# Patient Record
Sex: Female | Born: 1945 | Race: Black or African American | Hispanic: No | Smoking: Former smoker
Health system: Southern US, Community
[De-identification: ages and names within clinical notes are randomized; demographics above are authoritative.]

## PROBLEM LIST (undated history)

## (undated) DIAGNOSIS — E7800 Pure hypercholesterolemia, unspecified: Secondary | ICD-10-CM

## (undated) DIAGNOSIS — N950 Postmenopausal bleeding: Secondary | ICD-10-CM

## (undated) DIAGNOSIS — M1990 Unspecified osteoarthritis, unspecified site: Secondary | ICD-10-CM

## (undated) DIAGNOSIS — I10 Essential (primary) hypertension: Secondary | ICD-10-CM

## (undated) HISTORY — PX: CATARACT EXTRACTION: SUR2

## (undated) HISTORY — DX: Essential (primary) hypertension: I10

## (undated) HISTORY — DX: Postmenopausal bleeding: N95.0

## (undated) HISTORY — PX: COLONOSCOPY: SHX174

## (undated) HISTORY — PX: KNEE ARTHROSCOPY: SUR90

## (undated) HISTORY — PX: DILATION AND CURETTAGE OF UTERUS: SHX78

---

## 1998-07-06 ENCOUNTER — Ambulatory Visit (HOSPITAL_COMMUNITY): Admission: RE | Admit: 1998-07-06 | Discharge: 1998-07-06 | Payer: Self-pay | Admitting: Gastroenterology

## 1998-07-27 ENCOUNTER — Other Ambulatory Visit: Admission: RE | Admit: 1998-07-27 | Discharge: 1998-07-27 | Payer: Self-pay | Admitting: Gynecology

## 1999-10-05 ENCOUNTER — Ambulatory Visit (HOSPITAL_COMMUNITY): Admission: RE | Admit: 1999-10-05 | Discharge: 1999-10-05 | Payer: Self-pay | Admitting: Cardiology

## 1999-10-05 ENCOUNTER — Encounter: Payer: Self-pay | Admitting: Cardiology

## 2000-01-24 ENCOUNTER — Other Ambulatory Visit: Admission: RE | Admit: 2000-01-24 | Discharge: 2000-01-24 | Payer: Self-pay | Admitting: Gynecology

## 2000-02-13 ENCOUNTER — Other Ambulatory Visit: Admission: RE | Admit: 2000-02-13 | Discharge: 2000-02-13 | Payer: Self-pay | Admitting: Gynecology

## 2001-04-23 ENCOUNTER — Other Ambulatory Visit: Admission: RE | Admit: 2001-04-23 | Discharge: 2001-04-23 | Payer: Self-pay | Admitting: Internal Medicine

## 2001-07-15 ENCOUNTER — Encounter: Payer: Self-pay | Admitting: Emergency Medicine

## 2001-07-15 ENCOUNTER — Emergency Department (HOSPITAL_COMMUNITY): Admission: EM | Admit: 2001-07-15 | Discharge: 2001-07-15 | Payer: Self-pay | Admitting: Emergency Medicine

## 2001-10-24 ENCOUNTER — Encounter: Payer: Self-pay | Admitting: Internal Medicine

## 2001-10-24 ENCOUNTER — Encounter: Admission: RE | Admit: 2001-10-24 | Discharge: 2001-10-24 | Payer: Self-pay | Admitting: Internal Medicine

## 2002-03-09 ENCOUNTER — Inpatient Hospital Stay (HOSPITAL_COMMUNITY): Admission: AD | Admit: 2002-03-09 | Discharge: 2002-03-09 | Payer: Self-pay | Admitting: Gynecology

## 2002-03-10 ENCOUNTER — Encounter (INDEPENDENT_AMBULATORY_CARE_PROVIDER_SITE_OTHER): Payer: Self-pay

## 2002-04-30 ENCOUNTER — Encounter: Payer: Self-pay | Admitting: Cardiology

## 2002-04-30 ENCOUNTER — Ambulatory Visit (HOSPITAL_COMMUNITY): Admission: RE | Admit: 2002-04-30 | Discharge: 2002-04-30 | Payer: Self-pay | Admitting: Cardiology

## 2002-05-02 ENCOUNTER — Encounter (INDEPENDENT_AMBULATORY_CARE_PROVIDER_SITE_OTHER): Payer: Self-pay | Admitting: *Deleted

## 2002-05-02 ENCOUNTER — Ambulatory Visit (HOSPITAL_COMMUNITY): Admission: RE | Admit: 2002-05-02 | Discharge: 2002-05-02 | Payer: Self-pay | Admitting: Obstetrics & Gynecology

## 2003-03-16 ENCOUNTER — Ambulatory Visit (HOSPITAL_COMMUNITY): Admission: RE | Admit: 2003-03-16 | Discharge: 2003-03-16 | Payer: Self-pay | Admitting: Otolaryngology

## 2004-05-18 ENCOUNTER — Encounter (INDEPENDENT_AMBULATORY_CARE_PROVIDER_SITE_OTHER): Payer: Self-pay | Admitting: Specialist

## 2004-05-18 ENCOUNTER — Ambulatory Visit (HOSPITAL_BASED_OUTPATIENT_CLINIC_OR_DEPARTMENT_OTHER): Admission: RE | Admit: 2004-05-18 | Discharge: 2004-05-18 | Payer: Self-pay | Admitting: Urology

## 2004-05-18 ENCOUNTER — Ambulatory Visit (HOSPITAL_COMMUNITY): Admission: RE | Admit: 2004-05-18 | Discharge: 2004-05-18 | Payer: Self-pay | Admitting: Urology

## 2006-10-12 ENCOUNTER — Ambulatory Visit (HOSPITAL_COMMUNITY): Admission: RE | Admit: 2006-10-12 | Discharge: 2006-10-12 | Payer: Self-pay | Admitting: Cardiology

## 2009-05-05 ENCOUNTER — Encounter: Admission: RE | Admit: 2009-05-05 | Discharge: 2009-05-05 | Payer: Self-pay | Admitting: Internal Medicine

## 2010-05-07 ENCOUNTER — Encounter: Payer: Self-pay | Admitting: Cardiology

## 2010-09-02 NOTE — Op Note (Signed)
NAME:  Joanna Sandoval, Joanna Sandoval NO.:  [id number]   MEDICAL RECORD NO.:  [id number]                   PATIENT TYPE:  AMB   LOCATION:  SDC                                  FACILITY:  [facility]   PHYSICIAN:  Genia Del, M.D.             DATE OF BIRTH:  1945/04/22   DATE OF PROCEDURE:  DATE OF DISCHARGE:                                 OPERATIVE REPORT   PREOPERATIVE DIAGNOSIS:  Postmenopausal bleeding with intrauterine lesions.   POSTOPERATIVE DIAGNOSES:  1. Postmenopausal bleeding with intrauterine lesions.  2. Intrauterine polyps and myoma.   OPERATIVE INTERVENTIONS:  1. Hysteroscopy with dilatation and curettage.  2. Resection of polyps.  3. Partial resection of myoma.   SURGEON:  Genia Del, M.D.   ANESTHESIOLOGIST:  Octaviano Glow. Pamalee Leyden, M.D.   PROCEDURE:  Under general anesthesia with endotracheal intubation, the  patient is in lithotomy position.  She is prepped with Hibiclens on the  suprapubic, vulvar, and vaginal areas.  The bladder is catheterized and the  patient is draped as usual.  The vaginal exam reveals a mid position uterus,  normal volume, mobile, no adnexal mass.  The speculum is inserted.  The  anterior lip of the cervix is grasped with a tenaculum.  Dilatation of the  cervix is done with Hagar dilators up to 33 without difficulty.  We then  insert the operative hysteroscope.  Inspection of the intrauterine cavity  reveals a large myoma on the anterior aspect of the uterine cavity.  This  presents good visualization of the intrauterine cavity.  Polyps are also  visualized towards the left aspect of the intrauterine cavity.  We used a  single loop to resect the myoma.  It is not possible to resect it completely  due to difficult vision with poor landmarks.  The ostia are not visualized.  The polyps are also removed with the single loop.  All specimens are sent to  pathology.  We assure hemostasis with the loop under coag.  We  then  proceeded with curettage of the intrauterine cavity with a sharp curette on  all surfaces.  The specimen is sent separately to pathology.  We visualized  the intrauterine cavity once more.  Hemostasis was adequate.  The  intrauterine cavity is better visualized compared to the beginning of the  intervention but still some myoma is present.  No polyps are seen at the end  of the intervention.  The instruments are removed.  The estimated blood loss  was 50 cc.  Fluid deficit was 110 cc.  No complication occurred and the  patient was transferred to the recovery room in good status.                                               Genia Del,  M.D.    ML/MEDQ  D:  05/02/2002  T:  05/02/2002  Job:  [id number]

## 2010-09-02 NOTE — Op Note (Signed)
NAMEZINA, PITZER             ACCOUNT NO.:  [id number]   MEDICAL RECORD NO.:  [id number]          PATIENT TYPE:  AMB   LOCATION:  [facility]                         [facility]ACILITY:  [facility]   PHYSICIAN:  Ronald L. Earlene Plater, M.D.  DATE OF BIRTH:  Jul 27, 1945   DATE OF PROCEDURE:  05/18/2004  DATE OF DISCHARGE:                                 OPERATIVE REPORT   REFERRING PHYSICIAN:  Olene Craven, M.D.   PREOPERATIVE DIAGNOSIS:  Right ureterolithiasis with questionable ureteral  mass.  Passed right ureterolithiasis with questionable ureteral mass.   PROCEDURE:  Cystourethroscopy, right retrograde pyelogram, right  ureteroscopy with ureteral biopsies x2 and right double J stent.   SURGEON:  Lucrezia Starch. Earlene Plater, M.D.   ANESTHESIA:  LMA.   TUBES:  A 24 cm 6 [place] Cook double pigtail stent.   COMPLICATIONS:  None.   INDICATIONS FOR PROCEDURE:  Ms. Resh is a lovely 65 year old black  female who presented with abdominal pain, worse on the right, and underwent  a CT scan of the abdomen and pelvis with IV contrast.  She was found to have  a 5 x 6 mm right UVJ stone with urinary obstruction associated with a  surrounding mass that was felt to be possibly most likely inflammatory.  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  She has subsequently seen me.  She has had intermittent pain, although the  pain has subsequently subsided.  To her knowledge, she has really not passed  a stone.  After understanding risks, benefits, and alternatives, she has  elected to proceed with the above procedure.   DESCRIPTION OF PROCEDURE:  The patient was placed in the supine position  after proper LMA anesthesia and was placed in the dorsal lithotomy position  and prepped and draped with Betadine in sterile fashion.  Cystourethroscopy  was performed with a 22.5 French Olympus panendoscope utilizing  the 12 and  70 degree lenses.  The bladder was carefully inspected.  The urethra was  normal.  The left ureteral orifice was normal.  Efflux of clear urine was  noted.  The right ureteral orifice was quite inflamed and reddened and  exophytic, although it was in normal location and urine could be seen  effluxing from it and the remainder of the bladder utilizing 12 and 70  degree lenses were without lesions.  Under fluoroscopic guidance, a 0.038  French Teflon coated guidewire was placed into the right renal pelvis and  was noted to be in good position.  Ureteroscopy was then performed with a  short, thin Olympus ureteroscope.  The lower two thirds ureter were examined  and noted to be without stones.  Distal ureter was quite inflamed and there  was a questionable inflammatory mass, although it could simply be from a  passed stone.  Utilizing the ureteroscope, this area was visualized and two  ureteroscopic biopsies were obtained and submitted to pathology.  Good  hemostasis was noted to be present.  A retrograde ureteral  pyelogram was  then injected with an 8 Jamaica cone tipped catheter and the lower half  ureter appeared to be essentially normal except for the slightly narrowed  area at the biopsy region.  No frank filling defects were noted and the 6  Jamaica open ended catheter was placed over the wire and  the upper right  renal pelvis and ureter was injected and again noted to be without lesions.  The wire was replaced and under fluoroscopic guidance, a 24 cm 6 [place] Cook  double pigtail stent was placed and noted to be in good position within the  right renal pelvis within the bladder.  It was confirmed fluoroscopically  and cystoscopically.  Bladder was drained.  The panendoscope was removed.  The patient was taken to the recovery room stable.   PLAN:  Leave the stent in approximately five to six days.      RLD/MEDQ  D:  05/18/2004  T:  05/18/2004  Job:  [id number]   cc:   Olene Craven, M.D.  [address]  [address]  [place]  [place] [address]  Fax: [phone]

## 2010-09-02 NOTE — Consult Note (Signed)
NAMEPENNEY, DOMANSKI NO.:  [id number]   MEDICAL RECORD NO.:  [id number]                   PATIENT TYPE:  MAT   LOCATION:  MATC                                 FACILITY:  [facility]   PHYSICIAN:  Ivor Costa. Farrel Gobble, M.D.              DATE OF BIRTH:  November 06, 1945   DATE OF CONSULTATION:  03/09/2002  DATE OF DISCHARGE:                                   CONSULTATION   HISTORY OF PRESENT ILLNESS:  The patient is a 65 year old G4, PROBLEM 2-0-2-  1 with an LMP approximately three or four years ago that was spontaneous.  The patient reports having no postmenopausal problems or symptoms.  She did  have a single episode of postmenopausal bleeding about two years ago where  she bled lightly for three days but not seek intervention.  She had no  further bleeding until March 07, 2002, when the patient states that she  awoke with a small amount of thin blood on her undergarments.  At this point  it was painless.  The patient got dressed and went to work.  Throughout the  day her bleeding continued, and this morning at 6:30 the patient states that  the bleeding increased precipitously to the point it was running down her  legs and it was nonclotting.  The patient reports that the blood was dark in  nature, not malodorous.  She is not sexually active.  By this morning she  was cramping.  The patient is on no hormone replacement.  She states that  she had a cervical cancer that was treated at [facility] a number of  years ago but has had follow up Pap smears, all of which have been normal.  She has not seen her gynecologist in about four years.  She does get regular  Pap smears though through her internal medicine doctor.   PAST OBSTETRICAL HISTORY:  She had two SABs.  She had two spontaneous  vaginal deliveries.  She has one living child.  The remainder of the history  is above.   PAST MEDICAL HISTORY:  Significant for chronic hypertension and elevated  cholesterol.   The patient denies a history of coronary artery disease or  diabetes.   PAST SURGICAL HISTORY:  She had a D&C for what turned out to be endometrial  polyps years unknown.   MEDICATIONS:  She is on antihypertensives.   ALLERGIES:  Patient denies.   SOCIAL HISTORY:  She is divorced.  No alcohol, cigarettes or caffeine.   PHYSICAL EXAMINATION:  GENERAL:  She is a well-appearing female in no acute  distress.  VITAL SIGNS:  Her blood pressure is 169/75 lying, 158/66 sitting and 172/69  standing.  Her pulse went from 85 to 95 to 101, respectively.  Her  temperature is 98.8.  Her respirations are 20.  She is in no acute distress.  ABDOMEN:  Her abdomen is obese, soft and nontender.  GENITALIA:  She has normal external female genitalia.  The BUS is negative.  The vagina is pale and smooth consistent with postmenopausal status.  Her  cervix is flush.  On the right angle, there is a small amount of nonclotted  dark blood in the vault which is easily wiped away.  There was no active  bleeding from the cervix at this point.  On bimanual exam, the uterus is  difficult to palpate secondary to habitus.  The ovaries are also not  palpable and the adnexa are nontender.  On rectovaginal exam, the uterus  feels small.  There is posterior irregularity which would be consistent with  a fibroid.  There is no parametrial thickening that is appreciated.   ASSESSMENT:  1. Postmenopausal bleeding.  2. Remote history of cervical cancer, questionable dysplasia at [facility].   PLAN:  I discussed with the patient the need to differentiate cervical from  uterine origin.  It certainly would be helpful to get a copy of her Pap  smear which is less than one year old.  We will also recommend doing an  endometrial biopsy and the patient was consented.   PROCEDURE:  The cervix was then cleansed with Betadine and stabilized with a  single-tooth tenaculum after viscous lidocaine was placed.  The os was  probed and  fortunately, because of severe stenosis, the EMB was unable to be  passed.  The paracervical block was then placed with 10 cc of 1% lidocaine  solution.  Again, a single-tooth tenaculum was placed, and the canal was  able to be passed with a 3 Jamaica dilator.  After the dilator was removed,  there were two brisk bursts of blood, however, not enough to fill the  posterior vagina.  The endometrial Pipelle was then advanced through the  cervix.  The uterus sounded to 7.  Three passes of what mostly appeared to  be blood was obtained.  The instruments were then removed.  The patient  tolerated the procedure well.   ASSESSMENT:  Postmenopausal bleeding.  Endometrial biopsy was performed.  We  will send that for pathology.  She will follow up in our office on Tuesday  to discuss the results.  In the meantime, we gave her a prescription for  Aygestin 5 mg one t.i.d.  Hopefully this will stop her bleeding by Tuesday.  If her endometrial biopsy is nondiagnostic then certainly we will need to do  a fractionated D&C in the OR.  She will have an ultrasound done in the  office on Tuesday when she presents to evaluate questionable fibroids and  her endometrial stripe.  The differential is discussed with the patient as  well as we previously discussed the plan for each.  All questions were  addressed.                                               Ivor Costa. Farrel Gobble, M.D.    THL/MEDQ  D:  03/09/2002  T:  03/09/2002  Job:  [id number]

## 2010-10-06 ENCOUNTER — Other Ambulatory Visit: Payer: Self-pay | Admitting: Internal Medicine

## 2011-02-01 LAB — HEPATIC FUNCTION PANEL
ALT: 14
AST: 17
Albumin: 3.8
Alkaline Phosphatase: 96
Bilirubin, Direct: 0.2
Indirect Bilirubin: 0.7
Total Bilirubin: 0.9
Total Protein: 7

## 2011-02-01 LAB — LIPID PANEL
Cholesterol: 214 — ABNORMAL HIGH
HDL: 47
LDL Cholesterol: 151 — ABNORMAL HIGH
Total CHOL/HDL Ratio: 4.6
Triglycerides: 81
VLDL: 16

## 2011-02-01 LAB — BASIC METABOLIC PANEL
BUN: 17
CO2: 29
Calcium: 9.3
Chloride: 105
Creatinine, Ser: 0.87
GFR calc Af Amer: >60
GFR calc non Af Amer: >60
Glucose, Bld: 90
Potassium: 3.8
Sodium: 141

## 2011-02-01 LAB — HEMOGLOBIN A1C: Hgb A1c MFr Bld: 6.9 — ABNORMAL HIGH

## 2011-08-21 ENCOUNTER — Encounter: Payer: Self-pay | Admitting: Obstetrics & Gynecology

## 2011-08-30 ENCOUNTER — Encounter: Payer: Self-pay | Admitting: Obstetrics and Gynecology

## 2011-08-30 ENCOUNTER — Ambulatory Visit (INDEPENDENT_AMBULATORY_CARE_PROVIDER_SITE_OTHER): Payer: Self-pay | Admitting: Obstetrics and Gynecology

## 2011-08-30 VITALS — BP 159/76 | HR 88 | Temp 97.0°F | Resp 20 | Ht 62.0 in | Wt 206.4 lb

## 2011-08-30 DIAGNOSIS — N950 Postmenopausal bleeding: Secondary | ICD-10-CM

## 2011-08-30 DIAGNOSIS — N882 Stricture and stenosis of cervix uteri: Secondary | ICD-10-CM

## 2011-08-30 HISTORY — DX: Postmenopausal bleeding: N95.0

## 2011-08-30 MED ORDER — MISOPROSTOL 200 MCG PO TABS: ORAL_TABLET | ORAL | Status: DC

## 2011-08-30 NOTE — Progress Notes (Signed)
Pt reports intermittent abnormal bleeding x2 yrs.   Pt states she had menopause @ age 66.

## 2011-08-30 NOTE — Progress Notes (Signed)
  Subjective:    Patient ID: Joanna Sandoval, female    DOB: 1945/10/30, 66 y.o.   MRN: [id number]  HPI 66 yo G4P3011 with BMI 37 postmenopausal presented today for evaluation of postmenopausal bleeding. Patient reports experiencing vaginal bleeding lasting 2 days associated with cramping pain. Patient states that it has happened on 2 or 3 occasions this year. Patient is a poor historian. Patient had endometrial biopsy performed 2 years ago but is uncertain why. Patient also had a uterine polyp and submucosal fibroid resection in 2004 but didn't remember why it was done either. Patient reports that the bleeding occurs in a non-predictable pattern and she al  Past Medical History  Diagnosis Date  . Hypertension   . Diabetes mellitus   . Post-menopausal bleeding    Past Surgical History  Procedure Date  . Knee arthroscopy   . Dilation and curettage of uterus    Family History  Problem Relation Age of Onset  . Hypertension Mother   . Hypertension Father   . Cancer Father     ?lung  . Diabetes Sister    History  Substance Use Topics  . Smoking status: Former Smoker    Types: Cigarettes  . Smokeless tobacco: Not on file  . Alcohol Use: No    Review of Systems     Objective:   Physical Exam  GENERAL: Well-developed, well-nourished female in no acute distress.  ABDOMEN: Soft, nontender, nondistended. No organomegaly. PELVIC: Normal external female genitalia. Vagina is atrophic.  Atrophic appearing cervix. Uterus is small.  No adnexal mass or tenderness. EXTREMITIES: No cyanosis, clubbing, or edema, 2+ distal pulses.     Assessment & Plan:  66 yo [phone] with postmenopausal bleeding - attempted endometrial cavity failed due to cervical stenosis - Will schedule pelvic ultrasound and have the patient return for repeat endometrial biopsy s/p cytotec. - patient verbalized understanding and all questions were answered

## 2011-09-06 ENCOUNTER — Ambulatory Visit (HOSPITAL_COMMUNITY)
Admission: RE | Admit: 2011-09-06 | Discharge: 2011-09-06 | Disposition: A | Discharge status: Home / Self Care | Payer: Self-pay | Source: Ambulatory Visit | Attending: Obstetrics and Gynecology | Admitting: Obstetrics and Gynecology

## 2011-09-06 DIAGNOSIS — N950 Postmenopausal bleeding: Secondary | ICD-10-CM

## 2011-09-06 DIAGNOSIS — N949 Unspecified condition associated with female genital organs and menstrual cycle: Secondary | ICD-10-CM | POA: Insufficient documentation

## 2011-09-08 ENCOUNTER — Other Ambulatory Visit: Payer: Self-pay | Admitting: Physician Assistant

## 2011-10-04 ENCOUNTER — Telehealth: Payer: Self-pay | Admitting: General Practice

## 2011-10-04 MED ORDER — MISOPROSTOL 200 MCG PO TABS: ORAL_TABLET | ORAL | Status: DC

## 2011-10-04 NOTE — Telephone Encounter (Signed)
Patient called stating that she is scheduled to have a biopsy tomorrow and she's suppose to take pills tonight. Patient states the medication was not called in to her pharmacy which is [facility] on [address] and wants someone to call her back.

## 2011-10-04 NOTE — Telephone Encounter (Signed)
Called patient back informing her that her Rx had been sent back in to her [facility]. Patient voiced understanding and asked if the procedure (biopsy) would cause "hemorrhaging". I informed patient that the biopsy could cause some spotting afterwards, which is normal. Patient voiced understanding and stated she would pick up Cytotec tonight and take it. Patient had no further questions.

## 2011-10-05 ENCOUNTER — Encounter: Payer: Self-pay | Admitting: Advanced Practice Midwife

## 2011-10-05 ENCOUNTER — Ambulatory Visit (INDEPENDENT_AMBULATORY_CARE_PROVIDER_SITE_OTHER): Payer: Self-pay | Admitting: Advanced Practice Midwife

## 2011-10-05 ENCOUNTER — Other Ambulatory Visit (HOSPITAL_COMMUNITY)
Admission: RE | Admit: 2011-10-05 | Discharge: 2011-10-05 | Disposition: A | Discharge status: Home / Self Care | Payer: Self-pay | Source: Ambulatory Visit | Attending: Advanced Practice Midwife | Admitting: Advanced Practice Midwife

## 2011-10-05 VITALS — BP 141/76 | HR 74 | Temp 98.9°F | Ht 64.0 in | Wt 208.2 lb

## 2011-10-05 DIAGNOSIS — N950 Postmenopausal bleeding: Secondary | ICD-10-CM

## 2011-10-05 NOTE — Patient Instructions (Signed)
Endometrial Biopsy This is a test in which a tissue sample (a biopsy) is taken from inside the uterus (womb). It is then looked at by a specialist under a microscope to see if the tissue is normal or abnormal. The endometrium is the lining of the uterus. This test helps determine where you are in your menstrual cycle and how hormone levels are affecting the lining of the uterus. Another use for this test is to diagnose endometrial cancer, tuberculosis, polyps, or inflammatory conditions and to evaluate uterine bleeding. PREPARATION FOR TEST No preparation or fasting is necessary. NORMAL FINDINGS No pathologic conditions. Presence of "secretory-type" endometrium 3 to 5 days before to normal menstruation. Ranges for normal findings may vary among different laboratories and hospitals. You should always check with your doctor after having lab work or other tests done to discuss the meaning of your test results and whether your values are considered within normal limits. MEANING OF TEST  Your caregiver will go over the test results with you and discuss the importance and meaning of your results, as well as treatment options and the need for additional tests if necessary. OBTAINING THE TEST RESULTS It is your responsibility to obtain your test results. Ask the lab or department performing the test when and how you will get your results. Document Released: 08/04/2004 Document Revised: 03/23/2011 Document Reviewed: 03/13/2008 [employer] Patient Information 2012 [employer]. 

## 2011-10-05 NOTE — Progress Notes (Signed)
Pt is here for EBX for post-menopausal bleeding. Remote Hx of abnormal Pap and cryo in 20's. Normal Pap 05/2011. Dr. Jolayne Panther was unable to obtain a specimen when endometrial biopsy was attempted at last visit. Patient took Cytotec last night.   Patient given informed consent, signed copy in the chart, time out was performed. Appropriate time out taken. . The patient was placed in the lithotomy position. Bimanual exam was performed. Cervix was displaced to the patient's right and anteriorly. The cervix brought into view with sterile speculum.  Portio of cervix cleansed x 2 with betadine swabs.  A tenaculum was placed in the posterior lip of the cervix.  The uterus was sounded for depth of 7 cm. A pipelle was introduced to into the uterus, suction created,  and an endometrial sample was obtained. All equipment was removed and accounted for.  The patient tolerated the procedure well.    Patient given post procedure instructions. The patient will return in 3 weeks for results and discussion of management of postmenopausal bleeding.   [place], CNM 10/05/2011 2:15 PM

## 2011-11-03 ENCOUNTER — Ambulatory Visit (INDEPENDENT_AMBULATORY_CARE_PROVIDER_SITE_OTHER): Payer: Self-pay | Admitting: Advanced Practice Midwife

## 2011-11-03 ENCOUNTER — Encounter: Payer: Self-pay | Admitting: Advanced Practice Midwife

## 2011-11-03 VITALS — BP 115/65 | HR 75 | Temp 97.1°F | Ht 65.0 in | Wt 205.6 lb

## 2011-11-03 DIAGNOSIS — N950 Postmenopausal bleeding: Secondary | ICD-10-CM

## 2011-11-03 DIAGNOSIS — D219 Benign neoplasm of connective and other soft tissue, unspecified: Secondary | ICD-10-CM

## 2011-11-03 DIAGNOSIS — D259 Leiomyoma of uterus, unspecified: Secondary | ICD-10-CM

## 2011-11-03 MED ORDER — MEDROXYPROGESTERONE ACETATE 10 MG PO TABS: 10.0000 mg | ORAL_TABLET | Freq: Every day | ORAL | Status: DC

## 2011-11-03 NOTE — Progress Notes (Signed)
  Subjective:    Joanna Sandoval is a 66 y.o., post-menopausal female who presents for concerns regarding vaginal bleeding. She has been menopausal for several years. Currently on no HRT.[name] Bleeding is described as less flow than a normal period and has occurred 2-3 days every 3 months. Last bleeding episode was more than 2 months ago. Her bleeding is associated with back and pelvic pain. Workup to date: normal pap (05/2011), pelvic u/s (May 2013) and endometrial biopsy (June 2013). These results have returned within normal limits. Patient is returning to office today for these results.  Menstrual History: OB History    Grav Para Term Preterm Abortions TAB SAB Ect Mult Living   4 3 3  1  1   1      The following portions of the patient's history were reviewed and updated as appropriate: allergies, current medications, past family history, past medical history, past social history, past surgical history and problem list.  Review of Systems Pertinent items are noted in HPI.    Objective:    BP 115/65  Pulse 75  Temp 97.1 F (36.2 C) (Oral)  Ht 5\' 5"  (1.651 m)  Wt 205 lb 9.6 oz (93.26 kg)  BMI 34.21 kg/m2   General: Awake and alert. Happy, then tearful.  Abd: Soft, nontender Ext: Moves all extremities Neuro: Grossly intact  Assessment:    Postmenopausal bleeding - Normal endometrial biopsy  Plan:   - Patient appears upset over her bleeding. She was given reassurance that this is not cancer that we have seen on any of her results. - Discussed results with Dr. Debroah Loop, and Dorathy Kinsman CNM saw patient in exam room - Given information about endometrial ablation. She does not seem interested at this time, but encouraged to call to make an appointment with a surgeon if she decides this is an option for her - She is concerned about when the bleeding starts again. Given Rx for Provera 10mg  x10 days to use when her bleeding starts again. Patient seems happy with this plan - Plan to RTC for  6 months evaluation, or sooner if needed  Amber M. Hairford, M.D. 11/03/2011 9:17 AM

## 2011-11-03 NOTE — Patient Instructions (Addendum)
It was good to see you today. I am sorry you are still experiencing pain.  Your results were normal.   Postmenopausal Bleeding Menopause is when a woman's period stops. It is also not having periods for a total of 12 months. Postmenopausal bleeding is any bleeding after menopause. Any type of bleeding after menopause is concerning. Talk to your doctor about this. You may need medicine, hormones, surgery, or a procedure to remove tumors or growths. HOME CARE  Stay at a healthy weight.   Keep regular pelvic exams and Pap tests.  GET HELP RIGHT AWAY IF:   You have a fever.   You have chills, a headache, dizziness, muscle aches, and bleeding.   You have pain with bleeding.   You have clumps of blood (blood clots) coming from your vagina.   You have bleeding and need more than 1 pad an hour.   You feel like you are going to pass out (faint).   You have more bleeding than when you were having periods.   Your bleeding lasts for more than 1 week.   You have belly (abdominal) pain.   You have bleeding after sex (intercourse).  MAKE SURE YOU:  Understand these instructions.   Will watch your condition.   Will get help right away if you are not doing well or get worse.  Document Released: 01/11/2008 Document Revised: 03/23/2011 Document Reviewed: 12/08/2010 [facility] Patient Information 2012 [place], [place].

## 2011-11-11 DIAGNOSIS — D219 Benign neoplasm of connective and other soft tissue, unspecified: Secondary | ICD-10-CM | POA: Insufficient documentation

## 2011-11-14 ENCOUNTER — Emergency Department (HOSPITAL_COMMUNITY): Payer: Self-pay

## 2011-11-14 ENCOUNTER — Encounter (HOSPITAL_COMMUNITY): Payer: Self-pay | Admitting: Emergency Medicine

## 2011-11-14 ENCOUNTER — Emergency Department (HOSPITAL_COMMUNITY)
Admission: EM | Admit: 2011-11-14 | Discharge: 2011-11-14 | Disposition: A | Discharge status: Home / Self Care | Payer: Self-pay | Attending: Emergency Medicine | Admitting: Emergency Medicine

## 2011-11-14 DIAGNOSIS — N200 Calculus of kidney: Secondary | ICD-10-CM | POA: Insufficient documentation

## 2011-11-14 DIAGNOSIS — N201 Calculus of ureter: Secondary | ICD-10-CM | POA: Insufficient documentation

## 2011-11-14 DIAGNOSIS — I10 Essential (primary) hypertension: Secondary | ICD-10-CM | POA: Insufficient documentation

## 2011-11-14 DIAGNOSIS — R109 Unspecified abdominal pain: Secondary | ICD-10-CM | POA: Insufficient documentation

## 2011-11-14 DIAGNOSIS — E119 Type 2 diabetes mellitus without complications: Secondary | ICD-10-CM | POA: Insufficient documentation

## 2011-11-14 LAB — CBC WITH DIFFERENTIAL/PLATELET
Basophils Absolute: 0 10*3/uL (ref 0.0–0.1)
Basophils Relative: 0 % (ref 0–1)
Eosinophils Absolute: 0.3 10*3/uL (ref 0.0–0.7)
Eosinophils Relative: 2 % (ref 0–5)
HCT: 35.5 % — ABNORMAL LOW (ref 36.0–46.0)
Hemoglobin: 11.8 g/dL — ABNORMAL LOW (ref 12.0–15.0)
Lymphocytes Relative: 38 % (ref 12–46)
Lymphs Abs: 4.2 10*3/uL — ABNORMAL HIGH (ref 0.7–4.0)
MCH: 28.7 pg (ref 26.0–34.0)
MCHC: 33.2 g/dL (ref 30.0–36.0)
MCV: 86.4 fL (ref 78.0–100.0)
Monocytes Absolute: 0.8 10*3/uL (ref 0.1–1.0)
Monocytes Relative: 8 % (ref 3–12)
Neutro Abs: 5.8 10*3/uL (ref 1.7–7.7)
Neutrophils Relative %: 52 % (ref 43–77)
Platelets: 208 10*3/uL (ref 150–400)
RBC: 4.11 MIL/uL (ref 3.87–5.11)
RDW: 15.9 % — ABNORMAL HIGH (ref 11.5–15.5)
WBC: 11.1 10*3/uL — ABNORMAL HIGH (ref 4.0–10.5)

## 2011-11-14 LAB — URINALYSIS, ROUTINE W REFLEX MICROSCOPIC
Bilirubin Urine: NEGATIVE
Glucose, UA: NEGATIVE mg/dL
Ketones, ur: NEGATIVE mg/dL
Leukocytes, UA: NEGATIVE
Nitrite: NEGATIVE
Protein, ur: NEGATIVE mg/dL
Specific Gravity, Urine: 1.024 (ref 1.005–1.030)
Urobilinogen, UA: 0.2 mg/dL (ref 0.0–1.0)
pH: 5.5 (ref 5.0–8.0)

## 2011-11-14 LAB — COMPREHENSIVE METABOLIC PANEL
ALT: 8 U/L (ref 0–35)
AST: 12 U/L (ref 0–37)
Albumin: 3.7 g/dL (ref 3.5–5.2)
Alkaline Phosphatase: 99 U/L (ref 39–117)
BUN: 17 mg/dL (ref 6–23)
CO2: 28 mEq/L (ref 19–32)
Calcium: 9.5 mg/dL (ref 8.4–10.5)
Chloride: 104 mEq/L (ref 96–112)
Creatinine, Ser: 0.91 mg/dL (ref 0.50–1.10)
GFR calc Af Amer: 76 mL/min — ABNORMAL LOW (ref >90)
GFR calc non Af Amer: 65 mL/min — ABNORMAL LOW (ref >90)
Glucose, Bld: 156 mg/dL — ABNORMAL HIGH (ref 70–99)
Potassium: 3.5 mEq/L (ref 3.5–5.1)
Sodium: 140 mEq/L (ref 135–145)
Total Bilirubin: 0.3 mg/dL (ref 0.3–1.2)
Total Protein: 7.2 g/dL (ref 6.0–8.3)

## 2011-11-14 LAB — LIPASE, BLOOD: Lipase: 31 U/L (ref 11–59)

## 2011-11-14 LAB — URINE MICROSCOPIC-ADD ON

## 2011-11-14 MED ORDER — PROMETHAZINE HCL 25 MG/ML IJ SOLN
25.0000 mg | Freq: Once | INTRAMUSCULAR | Status: AC
  Administered 2011-11-14: 25 mg via INTRAVENOUS
  Filled 2011-11-14: qty 1

## 2011-11-14 MED ORDER — ONDANSETRON HCL 4 MG/2ML IJ SOLN
4.0000 mg | Freq: Once | INTRAMUSCULAR | Status: AC
  Administered 2011-11-14: 4 mg via INTRAVENOUS
  Filled 2011-11-14: qty 2

## 2011-11-14 MED ORDER — HYDROMORPHONE HCL PF 1 MG/ML IJ SOLN
1.0000 mg | Freq: Once | INTRAMUSCULAR | Status: AC
  Administered 2011-11-14: 1 mg via INTRAVENOUS
  Filled 2011-11-14: qty 1

## 2011-11-14 MED ORDER — SODIUM CHLORIDE 0.9 % IV SOLN
Freq: Once | INTRAVENOUS | Status: AC
  Administered 2011-11-14: 01:00:00 via INTRAVENOUS

## 2011-11-14 MED ORDER — KETOROLAC TROMETHAMINE 30 MG/ML IJ SOLN
30.0000 mg | Freq: Once | INTRAMUSCULAR | Status: AC
  Administered 2011-11-14: 30 mg via INTRAVENOUS
  Filled 2011-11-14: qty 1

## 2011-11-14 MED ORDER — HYDROCODONE-ACETAMINOPHEN 5-500 MG PO TABS: 1.0000 | ORAL_TABLET | Freq: Four times a day (QID) | ORAL | Status: AC | PRN

## 2011-11-14 MED ORDER — NAPROXEN 500 MG PO TABS: 500.0000 mg | ORAL_TABLET | Freq: Two times a day (BID) | ORAL | Status: AC

## 2011-11-14 MED ORDER — PROMETHAZINE HCL 25 MG PO TABS: 25.0000 mg | ORAL_TABLET | Freq: Four times a day (QID) | ORAL | Status: DC | PRN

## 2011-11-14 MED ORDER — FENTANYL CITRATE 0.05 MG/ML IJ SOLN
50.0000 ug | Freq: Once | INTRAMUSCULAR | Status: AC
  Administered 2011-11-14: 50 ug via INTRAVENOUS
  Filled 2011-11-14: qty 2

## 2011-11-14 MED ORDER — TAMSULOSIN HCL 0.4 MG PO CAPS: 0.4000 mg | ORAL_CAPSULE | Freq: Two times a day (BID) | ORAL | Status: DC

## 2011-11-14 NOTE — ED Notes (Signed)
Brought in by EMS from home with c/o hypogastric and RUQ abdominal pain, onset at around 2330 tonight. Pt denies nausea or vomiting.

## 2011-11-14 NOTE — ED Notes (Signed)
ZOX:[id number]<AV> Expected date:11/14/11<BR> Expected time:12:31 AM<BR> Means of arrival:Ambulance<BR> Comments:<BR> abd pain

## 2011-11-14 NOTE — ED Provider Notes (Signed)
History     CSN: [id number]  Arrival date & time 11/14/11  0045   First MD Initiated Contact with Patient 11/14/11 [phone]      Chief Complaint  Patient presents with  . Abdominal Pain    (Consider location/radiation/quality/duration/timing/severity/associated sxs/prior treatment) HPI Comments: 66 year old female with a history of diabetes, hypertension, kidney stones who presents with complaint of one day of intermittent sharp and stabbing left flank pain which radiates to the left lower quadrant. She denies changes in her urinary habits including no dysuria or hematuria but does admit to associated nausea and vomiting when the pain comes on today. Nothing seems to make this better or worse, and a spontaneous onset and offset. She denies, fevers, chills, coughing, shortness of breath.  Patient is a 66 y.o. female presenting with abdominal pain. The history is provided by the patient and the EMS personnel.  Abdominal Pain The primary symptoms of the illness include abdominal pain.    Past Medical History  Diagnosis Date  . Hypertension   . Diabetes mellitus   . Post-menopausal bleeding     Past Surgical History  Procedure Date  . Knee arthroscopy   . Dilation and curettage of uterus     Family History  Problem Relation Age of Onset  . Hypertension Mother   . Hypertension Father   . Cancer Father     ?lung  . Diabetes Sister     History  Substance Use Topics  . Smoking status: Former Smoker    Types: Cigarettes  . Smokeless tobacco: Not on file  . Alcohol Use: No    OB History    Grav Para Term Preterm Abortions TAB SAB Ect Mult Living   4 3 3  1  1   1       Review of Systems  Gastrointestinal: Positive for abdominal pain.  All other systems reviewed and are negative.    Allergies  Review of patient's allergies indicates no known allergies.  Home Medications   Current Outpatient Rx  Name Route Sig Dispense Refill  . ASPIRIN 81 MG PO TABS Oral Take 81  mg by mouth daily.    . ATENOLOL 100 MG PO TABS Oral Take 100 mg by mouth daily.    [name] GLIMEPIRIDE 2 MG PO TABS Oral Take 2 mg by mouth daily before breakfast.    . HYDROCODONE-ACETAMINOPHEN 5-325 MG PO TABS Oral Take 1 tablet by mouth every 6 (six) hours as needed.    [name] LISINOPRIL 20 MG PO TABS Oral Take 20 mg by mouth 2 (two) times daily.    [name] LORATADINE 10 MG PO TABS Oral Take 10 mg by mouth daily.    [name] HYDROCODONE-ACETAMINOPHEN 5-500 MG PO TABS Oral Take 1-2 tablets by mouth every 6 (six) hours as needed for pain. 15 tablet 0  . NAPROXEN 500 MG PO TABS Oral Take 1 tablet (500 mg total) by mouth 2 (two) times daily with a meal. 30 tablet 0  . PROMETHAZINE HCL 25 MG PO TABS Oral Take 1 tablet (25 mg total) by mouth every 6 (six) hours as needed for nausea. 12 tablet 0  . TAMSULOSIN HCL 0.4 MG PO CAPS Oral Take 1 capsule (0.4 mg total) by mouth 2 (two) times daily. 10 capsule 0    BP 133/47  Pulse 82  Temp 98.8 F (37.1 C)  Resp 18  SpO2 95%  Physical Exam  Nursing note and vitals reviewed. Constitutional: She appears well-developed and well-nourished.  Uncomfortable appearing  HENT:  Head: Normocephalic and atraumatic.  Mouth/Throat: Oropharynx is clear and moist. No oropharyngeal exudate.  Eyes: Conjunctivae and EOM are normal. Pupils are equal, round, and reactive to light. Right eye exhibits no discharge. Left eye exhibits no discharge. No scleral icterus.  Neck: Normal range of motion. Neck supple. No JVD present. No thyromegaly present.  Cardiovascular: Normal rate, regular rhythm, normal heart sounds and intact distal pulses.  Exam reveals no gallop and no friction rub.   No murmur heard. Pulmonary/Chest: Effort normal and breath sounds normal. No respiratory distress. She has no wheezes. She has no rales.  Abdominal: Soft. Bowel sounds are normal. She exhibits no distension and no mass. There is no tenderness.       No abdominal tenderness, no CVA tenderness    Musculoskeletal: Normal range of motion. She exhibits no edema and no tenderness.  Lymphadenopathy:    She has no cervical adenopathy.  Neurological: She is alert. Coordination normal.  Skin: Skin is warm and dry. No rash noted. No erythema.  Psychiatric: She has a normal mood and affect. Her behavior is normal.    ED Course  Procedures (including critical care time)  Labs Reviewed  COMPREHENSIVE METABOLIC PANEL - Abnormal; Notable for the following:    Glucose, Bld 156 (*)     GFR calc non Af Amer 65 (*)     GFR calc Af Amer 76 (*)     All other components within normal limits  CBC WITH DIFFERENTIAL - Abnormal; Notable for the following:    WBC 11.1 (*)     Hemoglobin 11.8 (*)     HCT 35.5 (*)     RDW 15.9 (*)     Lymphs Abs 4.2 (*)     All other components within normal limits  URINALYSIS, ROUTINE W REFLEX MICROSCOPIC - Abnormal; Notable for the following:    APPearance CLOUDY (*)     Hgb urine dipstick LARGE (*)     All other components within normal limits  URINE MICROSCOPIC-ADD ON - Abnormal; Notable for the following:    Squamous Epithelial / LPF MANY (*)     All other components within normal limits  LIPASE, BLOOD   Ct Abdomen Pelvis Wo Contrast  11/14/2011  *RADIOLOGY REPORT*  Clinical Data: Right upper quadrant pain  CT ABDOMEN AND PELVIS WITHOUT CONTRAST  Technique:  Multidetector CT imaging of the abdomen and pelvis was performed following the standard protocol without intravenous contrast.  Comparison: None.  Findings: Lung bases are clear.  Heart size within normal limits.  Organ abnormality/lesion detection is limited in the absence of intravenous contrast. Within this limitation, unremarkable liver, spleen, pancreas, biliary system, adrenal glands, right kidney. Left renal edema. Nonobstructing interpolar left renal stone. There is mild left hydroureteronephrosis to the level of a 6 mm UVJ stone.  No bowel obstruction.  No CT evidence for colitis.  Normal appendix.   No free intraperitoneal air or fluid.  No lymphadenopathy.  There is scattered atherosclerotic calcification of the aorta and its branches. No aneurysmal dilatation.  Decompressed bladder.  Nonspecific CT appearance to the uterus and adnexa.  Multilevel degenerative changes of the imaged spine. No acute or aggressive appearing osseous lesion.  Grade 1 anterolisthesis of L4 on L5.  IMPRESSION: Mild left hydroureteronephrosis to the level of a 6 mm left UVJ stone.  Additional nonobstructing left renal stone.  Original Report Authenticated By: Waneta Martins, M.D.     1. Kidney stone  MDM  Urinalysis reveals hemoglobin, this is consistent with a kidney stone as the patient does have a nontender abdomen. The location and quality in timing of her pain suggests a ureterolithiasis thus will evaluate the CT scan noncontrast as she is continuing to have significant symptoms. Intravenous medications including ketorolac and hydromorphone with Zofran ordered.  Renal function at baseline, mild hyperglycemia, no elevation in liver function tests or lipase. White blood cell count slightly elevated at 11,100  CT scan reviewed showing a 6 mm distal ureteral stone at the UVJ. These findings were discussed with the patient who is in agreement that she can go home and followup with the urologist as needed. Her symptoms are much improved after intravenous medications.  Discharge Prescriptions include:  Naprosyn Phenergan Hydrocodone / apap Flomax        Vida Roller, MD 11/14/11 [phone]

## 2011-11-14 NOTE — ED Notes (Signed)
Pt reports RUQ abdominal pain without nausea or vomiting. Pt reports that pain was sudden while she was eating a pie.  Pt reports that she had a good BM prior to her calling the EMS.

## 2013-02-26 ENCOUNTER — Other Ambulatory Visit: Payer: Self-pay | Admitting: Internal Medicine

## 2013-02-26 DIAGNOSIS — N644 Mastodynia: Secondary | ICD-10-CM

## 2013-03-26 ENCOUNTER — Ambulatory Visit
Admission: RE | Admit: 2013-03-26 | Discharge: 2013-03-26 | Disposition: A | Discharge status: Home / Self Care | Payer: Medicare Other | Source: Ambulatory Visit | Attending: Internal Medicine | Admitting: Internal Medicine

## 2013-03-26 DIAGNOSIS — N644 Mastodynia: Secondary | ICD-10-CM

## 2013-04-08 ENCOUNTER — Other Ambulatory Visit: Payer: Self-pay | Admitting: *Deleted

## 2013-04-08 DIAGNOSIS — M79609 Pain in unspecified limb: Secondary | ICD-10-CM

## 2013-05-01 ENCOUNTER — Other Ambulatory Visit: Payer: Self-pay | Admitting: Internal Medicine

## 2013-05-01 DIAGNOSIS — F4321 Adjustment disorder with depressed mood: Secondary | ICD-10-CM

## 2013-05-02 ENCOUNTER — Encounter: Payer: Self-pay | Admitting: Surgery

## 2013-05-05 ENCOUNTER — Encounter (INDEPENDENT_AMBULATORY_CARE_PROVIDER_SITE_OTHER): Payer: Self-pay

## 2013-05-05 ENCOUNTER — Encounter: Payer: Self-pay | Admitting: Surgery

## 2013-05-05 ENCOUNTER — Ambulatory Visit (HOSPITAL_COMMUNITY)
Admission: RE | Admit: 2013-05-05 | Discharge: 2013-05-05 | Disposition: A | Discharge status: Home / Self Care | Payer: Medicare HMO | Source: Ambulatory Visit | Attending: Surgery | Admitting: Surgery

## 2013-05-05 ENCOUNTER — Ambulatory Visit (INDEPENDENT_AMBULATORY_CARE_PROVIDER_SITE_OTHER): Payer: Medicare HMO | Admitting: Surgery

## 2013-05-05 VITALS — BP 112/47 | HR 73 | Ht 65.0 in | Wt 202.0 lb

## 2013-05-05 DIAGNOSIS — M79609 Pain in unspecified limb: Secondary | ICD-10-CM

## 2013-05-05 DIAGNOSIS — I70209 Unspecified atherosclerosis of native arteries of extremities, unspecified extremity: Secondary | ICD-10-CM | POA: Insufficient documentation

## 2013-05-05 NOTE — Progress Notes (Signed)
Patient name: Joanna Sandoval MRN: [id number] DOB: [birth year] Sex: female   Referred by: Dr. Baird Cancer  Reason for referral:  Chief Complaint  Patient presents with  . New Evaluation    leg pain (noncompressible L abi's-outside exam)    HISTORY OF PRESENT ILLNESS: This is a very pleasant 68 year old female who was referred today secondary to vascular lab studies which revealed noncompressible vessels.  The patient suffers from diabetes.  She does not know what her hemoglobin A1c Ron's.  She states her blood sugars are at least in the 140s.  She is starting to have complications from diabetes which include peripheral neuropathy as well as cataracts.  She denies having symptoms of claudication or leg cramps with walking.  She denies rest pain.  She has not had a nonhealing ulcer.  The patient's cholesterol profile is somewhat elevated however she is not yet on a statin.  She suffers from hypertension and is taking an ACE inhibitor.  She does complain of leg swelling.  She has a history of smoking.  She complains of pain in her right knee which she is scheduled to have surgery for in the future  Past Medical History  Diagnosis Date  . Hypertension   . Diabetes mellitus   . Post-menopausal bleeding     Past Surgical History  Procedure Laterality Date  . Knee arthroscopy    . Dilation and curettage of uterus      History   Social History  . Marital Status: Divorced    Spouse Name: N/A    Number of Children: N/A  . Years of Education: N/A   Occupational History  . Not on file.   Social History Main Topics  . Smoking status: Former Smoker    Types: Cigarettes  . Smokeless tobacco: Not on file  . Alcohol Use: No  . Drug Use: No  . Sexual Activity: Not Currently    Birth Control/ Protection: Post-menopausal   Other Topics Concern  . Not on file   Social History Narrative  . No narrative on file    Family History  Problem Relation Age of Onset  . Hypertension  Mother   . Hypertension Father   . Cancer Father     ?lung  . Diabetes Sister     Allergies as of 05/05/2013  . (No Known Allergies)    Current Outpatient Prescriptions on File Prior to Visit  Medication Sig Dispense Refill  . aspirin 81 MG tablet Take 81 mg by mouth daily.      [name] atenolol (TENORMIN) 100 MG tablet Take 100 mg by mouth daily.      [name] glimepiride (AMARYL) 2 MG tablet Take 2 mg by mouth daily before breakfast.      . lisinopril (PRINIVIL,ZESTRIL) 20 MG tablet Take 20 mg by mouth 2 (two) times daily.      [name] HYDROcodone-acetaminophen (NORCO/VICODIN) 5-325 MG per tablet Take 1 tablet by mouth every 6 (six) hours as needed.      . loratadine (CLARITIN) 10 MG tablet Take 10 mg by mouth daily.      . promethazine (PHENERGAN) 25 MG tablet Take 1 tablet (25 mg total) by mouth every 6 (six) hours as needed for nausea.  12 tablet  0  . Tamsulosin HCl (FLOMAX) 0.4 MG CAPS Take 1 capsule (0.4 mg total) by mouth 2 (two) times daily.  10 capsule  0   No current facility-administered medications on file prior to visit.  REVIEW OF SYSTEMS: Cardiovascular: Positive for pain in legs and walking and when lying flat.  Positive for leg swelling Pulmonary: No productive cough, asthma or wheezing. Neurologic: Positive for dizziness Hematologic: No bleeding problems or clotting disorders. Musculoskeletal: Positive for right knee pain Gastrointestinal: No blood in stool or hematemesis Genitourinary: No dysuria or hematuria. Psychiatric:: No history of major depression. Integumentary: No rashes or ulcers. Constitutional: No fever or chills.  PHYSICAL EXAMINATION: General: The patient appears their stated age.  Vital signs are BP 112/47  Pulse 73  Ht 5\' 5"  (1.651 m)  Wt 202 lb (91.627 kg)  BMI 33.61 kg/m2  SpO2 97% HEENT:  No gross abnormalities Pulmonary: Respirations are non-labored Abdomen: Soft and non-tender.  Aorta is not palpable Musculoskeletal: There are no major  deformities.   Neurologic: No focal weakness or paresthesias are detected, Skin: There are no ulcer or rashes noted. Psychiatric: The patient has normal affect. Cardiovascular: There is a regular rate and rhythm without significant murmur appreciated.  No carotid bruits.  Pedal pulses palpable bilaterally.  1+ edema bilaterally  Diagnostic Studies: Lower extremity arterial studies were performed today on the left leg.  This shows noncompressible vessels.  She has triphasic waveforms down to her popliteal artery where they become biphasic.  No hemodynamically significant stenoses were identified.   Assessment:  Peripheral vascular disease Plan: Based on vascular ultrasound imaging today, the patient most likely has calcified tibial vessels which is very classic in patients with diabetes.  No significant stenoses were identified.  The patient continues to have palpable pedal pulses.  I stressed to the patient the importance of daily with examinations and to contact me should she develop a wound on her foot that is not healing.  I stressed the importance of strict control of her blood sugar, blood pressure, and cholesterol.  As per the best way to weighed lower extremity complications in the future is to maximize medical therapy.  With the patient's bilateral leg swelling, I have given her a prescription for 15-20 mm Hg compression stockings.  She will contact me on an as-needed basis     V. Leia Alf, M.D. [facility] Office: [phone] Pager:  [phone]

## 2013-10-26 ENCOUNTER — Emergency Department (INDEPENDENT_AMBULATORY_CARE_PROVIDER_SITE_OTHER)
Admission: EM | Admit: 2013-10-26 | Discharge: 2013-10-26 | Disposition: A | Discharge status: Home / Self Care | Payer: Medicare HMO | Source: Home / Self Care | Attending: Family Medicine | Admitting: Family Medicine

## 2013-10-26 ENCOUNTER — Encounter (HOSPITAL_COMMUNITY): Payer: Self-pay | Admitting: Emergency Medicine

## 2013-10-26 DIAGNOSIS — S161XXA Strain of muscle, fascia and tendon at neck level, initial encounter: Secondary | ICD-10-CM

## 2013-10-26 DIAGNOSIS — S139XXA Sprain of joints and ligaments of unspecified parts of neck, initial encounter: Secondary | ICD-10-CM

## 2013-10-26 MED ORDER — TRAMADOL HCL 50 MG PO TABS: 50.0000 mg | ORAL_TABLET | Freq: Four times a day (QID) | ORAL | Status: DC | PRN
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
## 2013-10-26 NOTE — Discharge Instructions (Signed)
Thank you for coming in today. Attend physical therapy.  Use tramadol for severe pain.  Come back as needed.  Come back or go to the emergency room if you notice new weakness new numbness problems walking or bowel or bladder problems.   Cervical Strain and Sprain (Whiplash) with Rehab Cervical strain and sprains are injuries that commonly occur with "whiplash" injuries. Whiplash occurs when the neck is forcefully whipped backward or forward, such as during a motor vehicle accident. The muscles, ligaments, tendons, discs and nerves of the neck are susceptible to injury when this occurs. SYMPTOMS   Pain or stiffness in the front and/or back of neck  Symptoms may present immediately or up to 24 hours after injury.  Dizziness, headache, nausea and vomiting.  Muscle spasm with soreness and stiffness in the neck.  Tenderness and swelling at the injury site. CAUSES  Whiplash injuries often occur during contact sports or motor vehicle accidents.  RISK INCREASES WITH:  Osteoarthritis of the spine.  Situations that make head or neck accidents or trauma more likely.  High-risk sports (football, rugby, wrestling, hockey, auto racing, gymnastics, diving, contact karate or boxing).  Poor strength and flexibility of the neck.  Previous neck injury.  Poor tackling technique.  Improperly fitted or padded equipment. PREVENTION  Learn and use proper technique (avoid tackling with the head, spearing and head-butting; use proper falling techniques to avoid landing on the head).  Warm up and stretch properly before activity.  Maintain physical fitness:  Strength, flexibility and endurance.  Cardiovascular fitness.  Wear properly fitted and padded protective equipment, such as padded soft collars, for participation in contact sports. PROGNOSIS  Recovery for cervical strain and sprain injuries is dependent on the extent of the injury. These injuries are usually curable in 1 week to 3  months with appropriate treatment.  RELATED COMPLICATIONS   Temporary numbness and weakness may occur if the nerve roots are damaged, and this may persist until the nerve has completely healed.  Chronic pain due to frequent recurrence of symptoms.  Prolonged healing, especially if activity is resumed too soon (before complete recovery). TREATMENT  Treatment initially involves the use of ice and medication to help reduce pain and inflammation. It is also important to perform strengthening and stretching exercises and modify activities that worsen symptoms so the injury does not get worse. These exercises may be performed at home or with a therapist. For patients who experience severe symptoms, a soft padded collar may be recommended to be worn around the neck.  Improving your posture may help reduce symptoms. Posture improvement includes pulling your chin and abdomen in while sitting or standing. If you are sitting, sit in a firm chair with your buttocks against the back of the chair. While sleeping, try replacing your pillow with a small towel rolled to 2 inches in diameter, or use a cervical pillow or soft cervical collar. Poor sleeping positions delay healing.  For patients with nerve root damage, which causes numbness or weakness, the use of a cervical traction apparatus may be recommended. Surgery is rarely necessary for these injuries. However, cervical strain and sprains that are present at birth (congenital) may require surgery. MEDICATION   If pain medication is necessary, nonsteroidal anti-inflammatory medications, such as aspirin and ibuprofen, or other minor pain relievers, such as acetaminophen, are often recommended.  Do not take pain medication for 7 days before surgery.  Prescription pain relievers may be given if deemed necessary by your caregiver. Use only as directed  and only as much as you need. HEAT AND COLD:   Cold treatment (icing) relieves pain and reduces inflammation.  Cold treatment should be applied for 10 to 15 minutes every 2 to 3 hours for inflammation and pain and immediately after any activity that aggravates your symptoms. Use ice packs or an ice massage.  Heat treatment may be used prior to performing the stretching and strengthening activities prescribed by your caregiver, physical therapist, or athletic trainer. Use a heat pack or a warm soak. SEEK MEDICAL CARE IF:   Symptoms get worse or do not improve in 2 weeks despite treatment.  New, unexplained symptoms develop (drugs used in treatment may produce side effects). EXERCISES RANGE OF MOTION (ROM) AND STRETCHING EXERCISES - Cervical Strain and Sprain These exercises may help you when beginning to rehabilitate your injury. In order to successfully resolve your symptoms, you must improve your posture. These exercises are designed to help reduce the forward-head and rounded-shoulder posture which contributes to this condition. Your symptoms may resolve with or without further involvement from your physician, physical therapist or athletic trainer. While completing these exercises, remember:   Restoring tissue flexibility helps normal motion to return to the joints. This allows healthier, less painful movement and activity.  An effective stretch should be held for at least 20 seconds, although you may need to begin with shorter hold times for comfort.  A stretch should never be painful. You should only feel a gentle lengthening or release in the stretched tissue. STRETCH- Axial Extensors  Lie on your back on the floor. You may bend your knees for comfort. Place a rolled up hand towel or dish towel, about 2 inches in diameter, under the part of your head that makes contact with the floor.  Gently tuck your chin, as if trying to make a "double chin," until you feel a gentle stretch at the base of your head.  Hold __________ seconds. Repeat __________ times. Complete this exercise __________ times per  day.  STRETECH - Axial Extension   Stand or sit on a firm surface. Assume a good posture: chest up, shoulders drawn back, abdominal muscles slightly tense, knees unlocked (if standing) and feet hip width apart.  Slowly retract your chin so your head slides back and your chin slightly lowers.Continue to look straight ahead.  You should feel a gentle stretch in the back of your head. Be certain not to feel an aggressive stretch since this can cause headaches later.  Hold for __________ seconds. Repeat __________ times. Complete this exercise __________ times per day. STRETCH - Cervical Side Bend   Stand or sit on a firm surface. Assume a good posture: chest up, shoulders drawn back, abdominal muscles slightly tense, knees unlocked (if standing) and feet hip width apart.  Without letting your nose or shoulders move, slowly tip your right / left ear to your shoulder until your feel a gentle stretch in the muscles on the opposite side of your neck.  Hold __________ seconds. Repeat __________ times. Complete this exercise __________ times per day. STRETCH - Cervical Rotators   Stand or sit on a firm surface. Assume a good posture: chest up, shoulders drawn back, abdominal muscles slightly tense, knees unlocked (if standing) and feet hip width apart.  Keeping your eyes level with the ground, slowly turn your head until you feel a gentle stretch along the back and opposite side of your neck.  Hold __________ seconds. Repeat __________ times. Complete this exercise __________ times per day. RANGE  OF MOTION - Neck Circles   Stand or sit on a firm surface. Assume a good posture: chest up, shoulders drawn back, abdominal muscles slightly tense, knees unlocked (if standing) and feet hip width apart.  Gently roll your head down and around from the back of one shoulder to the back of the other. The motion should never be forced or painful.  Repeat the motion 10-20 times, or until you feel the neck  muscles relax and loosen. Repeat __________ times. Complete the exercise __________ times per day. STRENGTHENING EXERCISES - Cervical Strain and Sprain These exercises may help you when beginning to rehabilitate your injury. They may resolve your symptoms with or without further involvement from your physician, physical therapist or athletic trainer. While completing these exercises, remember:   Muscles can gain both the endurance and the strength needed for everyday activities through controlled exercises.  Complete these exercises as instructed by your physician, physical therapist or athletic trainer. Progress the resistance and repetitions only as guided.  You may experience muscle soreness or fatigue, but the pain or discomfort you are trying to eliminate should never worsen during these exercises. If this pain does worsen, stop and make certain you are following the directions exactly. If the pain is still present after adjustments, discontinue the exercise until you can discuss the trouble with your clinician. STRENGTH - Cervical Flexors, Isometric  Face a wall, standing about 6 inches away. Place a small pillow, a ball about 6-8 inches in diameter, or a folded towel between your forehead and the wall.  Slightly tuck your chin and gently push your forehead into the soft object. Push only with mild to moderate intensity, building up tension gradually. Keep your jaw and forehead relaxed.  Hold 10 to 20 seconds. Keep your breathing relaxed.  Release the tension slowly. Relax your neck muscles completely before you start the next repetition. Repeat __________ times. Complete this exercise __________ times per day. STRENGTH- Cervical Lateral Flexors, Isometric   Stand about 6 inches away from a wall. Place a small pillow, a ball about 6-8 inches in diameter, or a folded towel between the side of your head and the wall.  Slightly tuck your chin and gently tilt your head into the soft object.  Push only with mild to moderate intensity, building up tension gradually. Keep your jaw and forehead relaxed.  Hold 10 to 20 seconds. Keep your breathing relaxed.  Release the tension slowly. Relax your neck muscles completely before you start the next repetition. Repeat __________ times. Complete this exercise __________ times per day. STRENGTH - Cervical Extensors, Isometric   Stand about 6 inches away from a wall. Place a small pillow, a ball about 6-8 inches in diameter, or a folded towel between the back of your head and the wall.  Slightly tuck your chin and gently tilt your head back into the soft object. Push only with mild to moderate intensity, building up tension gradually. Keep your jaw and forehead relaxed.  Hold 10 to 20 seconds. Keep your breathing relaxed.  Release the tension slowly. Relax your neck muscles completely before you start the next repetition. Repeat __________ times. Complete this exercise __________ times per day. POSTURE AND BODY MECHANICS CONSIDERATIONS - Cervical Strain and Sprain Keeping correct posture when sitting, standing or completing your activities will reduce the stress put on different body tissues, allowing injured tissues a chance to heal and limiting painful experiences. The following are general guidelines for improved posture. Your physician or physical therapist  will provide you with any instructions specific to your needs. While reading these guidelines, remember:  The exercises prescribed by your provider will help you have the flexibility and strength to maintain correct postures.  The correct posture provides the optimal environment for your joints to work. All of your joints have less wear and tear when properly supported by a spine with good posture. This means you will experience a healthier, less painful body.  Correct posture must be practiced with all of your activities, especially prolonged sitting and standing. Correct posture is as  important when doing repetitive low-stress activities (typing) as it is when doing a single heavy-load activity (lifting). PROLONGED STANDING WHILE SLIGHTLY LEANING FORWARD When completing a task that requires you to lean forward while standing in one place for a long time, place either foot up on a stationary 2-4 inch high object to help maintain the best posture. When both feet are on the ground, the low back tends to lose its slight inward curve. If this curve flattens (or becomes too large), then the back and your other joints will experience too much stress, fatigue more quickly and can cause pain.  RESTING POSITIONS Consider which positions are most painful for you when choosing a resting position. If you have pain with flexion-based activities (sitting, bending, stooping, squatting), choose a position that allows you to rest in a less flexed posture. You would want to avoid curling into a fetal position on your side. If your pain worsens with extension-based activities (prolonged standing, working overhead), avoid resting in an extended position such as sleeping on your stomach. Most people will find more comfort when they rest with their spine in a more neutral position, neither too rounded nor too arched. Lying on a non-sagging bed on your side with a pillow between your knees, or on your back with a pillow under your knees will often provide some relief. Keep in mind, being in any one position for a prolonged period of time, no matter how correct your posture, can still lead to stiffness. WALKING Walk with an upright posture. Your ears, shoulders and hips should all line-up. OFFICE WORK When working at a desk, create an environment that supports good, upright posture. Without extra support, muscles fatigue and lead to excessive strain on joints and other tissues. CHAIR:  A chair should be able to slide under your desk when your back makes contact with the back of the chair. This allows you to  work closely.  The chair's height should allow your eyes to be level with the upper part of your monitor and your hands to be slightly lower than your elbows.  Body position:  Your feet should make contact with the floor. If this is not possible, use a foot rest.  Keep your ears over your shoulders. This will reduce stress on your neck and low back. Document Released: 04/03/2005 Document Revised: 07/29/2012 Document Reviewed: 07/16/2008 [facility] Patient Information 2015 [place], [place]. This information is not intended to replace advice given to you by your health care provider. Make sure you discuss any questions you have with your health care provider.

## 2014-01-05 ENCOUNTER — Institutional Professional Consult (permissible substitution): Payer: Self-pay | Admitting: Neurology

## 2014-02-16 ENCOUNTER — Encounter (HOSPITAL_COMMUNITY): Payer: Self-pay | Admitting: Emergency Medicine

## 2014-08-26 ENCOUNTER — Other Ambulatory Visit: Payer: Self-pay | Admitting: Internal Medicine

## 2014-08-26 DIAGNOSIS — R591 Generalized enlarged lymph nodes: Secondary | ICD-10-CM

## 2014-08-27 ENCOUNTER — Ambulatory Visit
Admission: RE | Admit: 2014-08-27 | Discharge: 2014-08-27 | Disposition: A | Discharge status: Home / Self Care | Payer: Medicare HMO | Source: Ambulatory Visit | Attending: Internal Medicine | Admitting: Internal Medicine

## 2014-08-27 DIAGNOSIS — R591 Generalized enlarged lymph nodes: Secondary | ICD-10-CM

## 2014-10-29 ENCOUNTER — Other Ambulatory Visit: Payer: Self-pay | Admitting: *Deleted

## 2014-10-29 ENCOUNTER — Ambulatory Visit
Admission: RE | Admit: 2014-10-29 | Discharge: 2014-10-29 | Disposition: A | Discharge status: Home / Self Care | Payer: Medicare HMO | Source: Ambulatory Visit | Attending: *Deleted | Admitting: *Deleted

## 2014-10-29 ENCOUNTER — Other Ambulatory Visit: Payer: Self-pay | Admitting: Nurse Practitioner

## 2014-10-29 DIAGNOSIS — R053 Chronic cough: Secondary | ICD-10-CM

## 2014-10-29 DIAGNOSIS — R05 Cough: Secondary | ICD-10-CM

## 2015-04-21 DIAGNOSIS — E119 Type 2 diabetes mellitus without complications: Secondary | ICD-10-CM | POA: Diagnosis not present

## 2015-04-21 DIAGNOSIS — E669 Obesity, unspecified: Secondary | ICD-10-CM | POA: Diagnosis not present

## 2015-04-21 DIAGNOSIS — I10 Essential (primary) hypertension: Secondary | ICD-10-CM | POA: Diagnosis not present

## 2015-04-21 DIAGNOSIS — E785 Hyperlipidemia, unspecified: Secondary | ICD-10-CM | POA: Diagnosis not present

## 2015-07-14 DIAGNOSIS — E1165 Type 2 diabetes mellitus with hyperglycemia: Secondary | ICD-10-CM | POA: Diagnosis not present

## 2015-07-14 DIAGNOSIS — E784 Other hyperlipidemia: Secondary | ICD-10-CM | POA: Diagnosis not present

## 2015-07-14 DIAGNOSIS — I10 Essential (primary) hypertension: Secondary | ICD-10-CM | POA: Diagnosis not present

## 2015-07-14 DIAGNOSIS — R05 Cough: Secondary | ICD-10-CM | POA: Diagnosis not present

## 2015-07-14 DIAGNOSIS — E785 Hyperlipidemia, unspecified: Secondary | ICD-10-CM | POA: Diagnosis not present

## 2015-08-11 DIAGNOSIS — E119 Type 2 diabetes mellitus without complications: Secondary | ICD-10-CM | POA: Diagnosis not present

## 2015-08-11 DIAGNOSIS — E785 Hyperlipidemia, unspecified: Secondary | ICD-10-CM | POA: Diagnosis not present

## 2015-08-11 DIAGNOSIS — E669 Obesity, unspecified: Secondary | ICD-10-CM | POA: Diagnosis not present

## 2015-08-11 DIAGNOSIS — I10 Essential (primary) hypertension: Secondary | ICD-10-CM | POA: Diagnosis not present

## 2015-12-22 DIAGNOSIS — Z1231 Encounter for screening mammogram for malignant neoplasm of breast: Secondary | ICD-10-CM | POA: Diagnosis not present

## 2015-12-22 DIAGNOSIS — R5383 Other fatigue: Secondary | ICD-10-CM | POA: Diagnosis not present

## 2015-12-22 DIAGNOSIS — Z0000 Encounter for general adult medical examination without abnormal findings: Secondary | ICD-10-CM | POA: Diagnosis not present

## 2015-12-22 DIAGNOSIS — Z79899 Other long term (current) drug therapy: Secondary | ICD-10-CM | POA: Diagnosis not present

## 2015-12-22 DIAGNOSIS — E1165 Type 2 diabetes mellitus with hyperglycemia: Secondary | ICD-10-CM | POA: Diagnosis not present

## 2015-12-22 DIAGNOSIS — E784 Other hyperlipidemia: Secondary | ICD-10-CM | POA: Diagnosis not present

## 2015-12-22 DIAGNOSIS — I10 Essential (primary) hypertension: Secondary | ICD-10-CM | POA: Diagnosis not present

## 2015-12-23 DIAGNOSIS — I10 Essential (primary) hypertension: Secondary | ICD-10-CM | POA: Diagnosis not present

## 2015-12-23 DIAGNOSIS — Z0000 Encounter for general adult medical examination without abnormal findings: Secondary | ICD-10-CM | POA: Diagnosis not present

## 2015-12-23 DIAGNOSIS — E1165 Type 2 diabetes mellitus with hyperglycemia: Secondary | ICD-10-CM | POA: Diagnosis not present

## 2015-12-23 DIAGNOSIS — E784 Other hyperlipidemia: Secondary | ICD-10-CM | POA: Diagnosis not present

## 2015-12-23 DIAGNOSIS — R5383 Other fatigue: Secondary | ICD-10-CM | POA: Diagnosis not present

## 2016-02-02 DIAGNOSIS — R51 Headache: Secondary | ICD-10-CM | POA: Diagnosis not present

## 2016-03-15 DIAGNOSIS — E785 Hyperlipidemia, unspecified: Secondary | ICD-10-CM | POA: Diagnosis not present

## 2016-03-15 DIAGNOSIS — I10 Essential (primary) hypertension: Secondary | ICD-10-CM | POA: Diagnosis not present

## 2016-03-15 DIAGNOSIS — E119 Type 2 diabetes mellitus without complications: Secondary | ICD-10-CM | POA: Diagnosis not present

## 2016-03-15 DIAGNOSIS — E559 Vitamin D deficiency, unspecified: Secondary | ICD-10-CM | POA: Diagnosis not present

## 2016-03-19 ENCOUNTER — Ambulatory Visit (INDEPENDENT_AMBULATORY_CARE_PROVIDER_SITE_OTHER): Payer: PPO

## 2016-03-19 ENCOUNTER — Ambulatory Visit (HOSPITAL_COMMUNITY)
Admission: EM | Admit: 2016-03-19 | Discharge: 2016-03-19 | Disposition: A | Discharge status: Home / Self Care | Payer: PPO | Attending: Emergency Medicine | Admitting: Emergency Medicine

## 2016-03-19 ENCOUNTER — Encounter (HOSPITAL_COMMUNITY): Payer: Self-pay | Admitting: *Deleted

## 2016-03-19 DIAGNOSIS — M25521 Pain in right elbow: Secondary | ICD-10-CM

## 2016-03-19 DIAGNOSIS — R600 Localized edema: Secondary | ICD-10-CM | POA: Diagnosis not present

## 2016-03-19 HISTORY — DX: Unspecified osteoarthritis, unspecified site: M19.90

## 2016-03-19 HISTORY — DX: Pure hypercholesterolemia, unspecified: E78.00

## 2016-03-19 MED ORDER — MELOXICAM 7.5 MG PO TABS: 7.5000 mg | ORAL_TABLET | Freq: Every day | ORAL | 0 refills | Status: DC

## 2016-03-19 NOTE — Discharge Instructions (Signed)
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 Continue to ice the elbow to 3 times a day. Take meloxicam daily for 1 week, then as needed for pain. Use the sling for the next few days. Gently bend and straighten your arm as much as you can 2-3 times a day. If this is not improving by Wednesday, please follow-up with Dr. Lyla Glassing, and orthopedic doctor.

## 2016-03-19 NOTE — ED Triage Notes (Signed)
Denies injury.  C/O right elbow pain and forearm swelling x 2 days.  Has pinpoint area of tenderness to elbow x 2.  Has been applying heat and ice, and using epsom salt soak.  Had been having same type of pain in left elbow, but has resolved.  C/O painful ROM.  Saw PCP yesterday briefly - was told to come to [facility] if continued with swelling.  Denies any parasthesias.  RUE CMS intact.

## 2016-03-19 NOTE — ED Provider Notes (Signed)
[facility]    CSN: [id number] Arrival date & time: 03/19/16  1205     History   Chief Complaint Chief Complaint  Patient presents with  . Arm Pain    HPI Leylah Knierim is a 70 y.o. female.   HPI  She is a 70 year old woman here for evaluation of right elbow pain. The pain is localized to the medial elbow. It started on Friday and has gradually been getting worse. There is some associated swelling. She denies any injury or trauma. She does state she carried a lot of groceries on that arm, but otherwise no unusual activities. She is currently working as a Quarry manager. She denies any radiating pain. No pain in the shoulder or the wrist. She has full range of motion in the shoulder and the wrist. Pain is worse in the elbow with flexion and extension. She has tried ice, heat, and Epsom salts soaks without improvement.  Past Medical History:  Diagnosis Date  . Arthritis   . Diabetes mellitus   . Hypercholesteremia   . Hypertension   . Post-menopausal bleeding     Patient Active Problem List   Diagnosis Date Noted  . Fibroid 11/11/2011  . Postmenopausal bleeding 08/30/2011    Past Surgical History:  Procedure Laterality Date  . CATARACT EXTRACTION    . DILATION AND CURETTAGE OF UTERUS    . KNEE ARTHROSCOPY      OB History    Gravida Para Term Preterm AB Living   4 3 3   1 1    SAB TAB Ectopic Multiple Live Births   1               Home Medications    Prior to Admission medications   Medication Sig Start Date End Date Taking? Authorizing Provider  aspirin 81 MG tablet Take 81 mg by mouth daily.   Yes Historical Provider, MD  atenolol (TENORMIN) 100 MG tablet Take 100 mg by mouth daily.   Yes Historical Provider, MD  atorvastatin (LIPITOR) 40 MG tablet Take 40 mg by mouth daily. Take 1/2 pill at bedtime   Yes Historical Provider, MD  glimepiride (AMARYL) 2 MG tablet Take 2 mg by mouth daily before breakfast.   Yes Historical Provider, MD  losartan (COZAAR) 50  MG tablet Take 50 mg by mouth daily.   Yes Historical Provider, MD  meloxicam (MOBIC) 7.5 MG tablet Take 1 tablet (7.5 mg total) by mouth daily. 03/19/16   Melony Overly, MD  promethazine (PHENERGAN) 25 MG tablet Take 1 tablet (25 mg total) by mouth every 6 (six) hours as needed for nausea. 11/14/11 11/21/11  Noemi Chapel, MD    Family History Family History  Problem Relation Age of Onset  . Hypertension Mother   . Hypertension Father   . Cancer Father     ?lung  . Diabetes Sister     Social History Social History  Substance Use Topics  . Smoking status: Former Smoker    Types: Cigarettes  . Smokeless tobacco: Never Used  . Alcohol use No     Allergies   Patient has no known allergies.   Review of Systems Review of Systems As in history of present illness  Physical Exam Triage Vital Signs ED Triage Vitals [03/19/16 1227]  Enc Vitals Group     BP 155/66     Pulse Rate 90     Resp 16     Temp 98.7 F (37.1 C)     Temp  Source Oral     SpO2 97 %     Weight      Height      Head Circumference      Peak Flow      Pain Score      Pain Loc      Pain Edu?      Excl. in [place]?    No data found.   Updated Vital Signs BP 155/66 (BP Location: Left Arm)   Pulse 90   Temp 98.7 F (37.1 C) (Oral)   Resp 16   SpO2 97%   Visual Acuity Right Eye Distance:   Left Eye Distance:   Bilateral Distance:    Right Eye Near:   Left Eye Near:    Bilateral Near:     Physical Exam  Constitutional: She is oriented to person, place, and time. She appears well-developed and well-nourished. No distress.  Cardiovascular: Normal rate.   Pulmonary/Chest: Effort normal.  Musculoskeletal:  Right elbow: No erythema. She does appear to have a little bit of swelling, primarily at the medial aspect. She has point tender at the posterior and medial medial epicondyle. Range of motion is limited. I am unable to fully extend the elbow. Flexion is limited to 90. Able to pronate and supinate  without difficulty.  Neurological: She is alert and oriented to person, place, and time.     UC Treatments / Results  Labs (all labs ordered are listed, but only abnormal results are displayed) Labs Reviewed - No data to display  EKG  EKG Interpretation None       Radiology Dg Elbow Complete Right  Result Date: 03/19/2016 CLINICAL DATA:  Patient states that she has a lot of pain in her right elbow, with sensitivity to touch and limited mobility x 3 days, NKI. EXAM: RIGHT ELBOW - COMPLETE 3+ VIEW COMPARISON:  None. FINDINGS: No acute fracture. No bone lesion. There is spurring from the coronoid process of the ulna. There is some irregular calcification or ossification below the medial epicondyles. No elbow joint effusion. Soft tissue edema is noted most prominent along the dorsal and ulnar aspect of the elbow. IMPRESSION: 1. No fracture, bone lesion or acute abnormality. 2. Nonspecific posterior and medial elbow subcutaneous soft tissue edema. 3. Ossification or calcification below the medial epicondyle may reside in the common flexor tendon. Consider common flexor tendinopathy. 4. No elbow joint effusion. Electronically Signed   By: Lajean Manes M.D.   On: 03/19/2016 13:21    Procedures Procedures (including critical care time)  Medications Ordered in UC Medications - No data to display   Initial Impression / Assessment and Plan / UC Course  I have reviewed the triage vital signs and the nursing notes.  Pertinent labs & imaging results that were available during my care of the patient were reviewed by me and considered in my medical decision making (see chart for details).  Clinical Course     X-ray shows ossification in her area of tenderness. Symptomatic treatment with ice, meloxicam, and swelling. Recommended gentle range of motion several times a day. If not improving by Wednesday, follow-up with orthopedics.  Final Clinical Impressions(s) / UC Diagnoses   Final  diagnoses:  Right elbow pain    New Prescriptions New Prescriptions   MELOXICAM (MOBIC) 7.5 MG TABLET    Take 1 tablet (7.5 mg total) by mouth daily.     Melony Overly, MD 03/19/16 1336

## 2016-03-25 DIAGNOSIS — E1165 Type 2 diabetes mellitus with hyperglycemia: Secondary | ICD-10-CM | POA: Diagnosis not present

## 2016-04-26 ENCOUNTER — Other Ambulatory Visit: Payer: Self-pay | Admitting: Nurse Practitioner

## 2016-04-26 DIAGNOSIS — Z1231 Encounter for screening mammogram for malignant neoplasm of breast: Secondary | ICD-10-CM

## 2016-05-02 ENCOUNTER — Other Ambulatory Visit: Payer: Self-pay | Admitting: Nurse Practitioner

## 2016-05-02 ENCOUNTER — Ambulatory Visit
Admission: RE | Admit: 2016-05-02 | Discharge: 2016-05-02 | Disposition: A | Discharge status: Home / Self Care | Payer: PPO | Source: Ambulatory Visit | Attending: Nurse Practitioner | Admitting: Nurse Practitioner

## 2016-05-02 DIAGNOSIS — Z1231 Encounter for screening mammogram for malignant neoplasm of breast: Secondary | ICD-10-CM

## 2016-05-02 DIAGNOSIS — N644 Mastodynia: Secondary | ICD-10-CM

## 2016-05-12 ENCOUNTER — Ambulatory Visit
Admission: RE | Admit: 2016-05-12 | Discharge: 2016-05-12 | Disposition: A | Discharge status: Home / Self Care | Payer: PPO | Source: Ambulatory Visit | Attending: Nurse Practitioner | Admitting: Nurse Practitioner

## 2016-05-12 DIAGNOSIS — N644 Mastodynia: Secondary | ICD-10-CM

## 2016-05-12 DIAGNOSIS — N6489 Other specified disorders of breast: Secondary | ICD-10-CM | POA: Diagnosis not present

## 2016-06-21 DIAGNOSIS — I10 Essential (primary) hypertension: Secondary | ICD-10-CM | POA: Diagnosis not present

## 2016-06-21 DIAGNOSIS — E785 Hyperlipidemia, unspecified: Secondary | ICD-10-CM | POA: Diagnosis not present

## 2016-06-21 DIAGNOSIS — E1165 Type 2 diabetes mellitus with hyperglycemia: Secondary | ICD-10-CM | POA: Diagnosis not present

## 2016-06-21 DIAGNOSIS — R05 Cough: Secondary | ICD-10-CM | POA: Diagnosis not present

## 2016-06-24 DIAGNOSIS — E1165 Type 2 diabetes mellitus with hyperglycemia: Secondary | ICD-10-CM | POA: Diagnosis not present

## 2016-09-20 DIAGNOSIS — I10 Essential (primary) hypertension: Secondary | ICD-10-CM | POA: Diagnosis not present

## 2016-09-20 DIAGNOSIS — Z1211 Encounter for screening for malignant neoplasm of colon: Secondary | ICD-10-CM | POA: Diagnosis not present

## 2016-09-20 DIAGNOSIS — E785 Hyperlipidemia, unspecified: Secondary | ICD-10-CM | POA: Diagnosis not present

## 2016-09-20 DIAGNOSIS — E1165 Type 2 diabetes mellitus with hyperglycemia: Secondary | ICD-10-CM | POA: Diagnosis not present

## 2016-09-20 DIAGNOSIS — R05 Cough: Secondary | ICD-10-CM | POA: Diagnosis not present

## 2016-10-23 DIAGNOSIS — E559 Vitamin D deficiency, unspecified: Secondary | ICD-10-CM | POA: Diagnosis not present

## 2016-10-23 DIAGNOSIS — E119 Type 2 diabetes mellitus without complications: Secondary | ICD-10-CM | POA: Diagnosis not present

## 2016-10-23 DIAGNOSIS — I10 Essential (primary) hypertension: Secondary | ICD-10-CM | POA: Diagnosis not present

## 2016-10-23 DIAGNOSIS — E785 Hyperlipidemia, unspecified: Secondary | ICD-10-CM | POA: Diagnosis not present

## 2016-10-23 DIAGNOSIS — M1990 Unspecified osteoarthritis, unspecified site: Secondary | ICD-10-CM | POA: Diagnosis not present

## 2016-12-13 DIAGNOSIS — Z1212 Encounter for screening for malignant neoplasm of rectum: Secondary | ICD-10-CM | POA: Diagnosis not present

## 2016-12-13 DIAGNOSIS — Z1211 Encounter for screening for malignant neoplasm of colon: Secondary | ICD-10-CM | POA: Diagnosis not present

## 2016-12-27 DIAGNOSIS — Z1231 Encounter for screening mammogram for malignant neoplasm of breast: Secondary | ICD-10-CM | POA: Diagnosis not present

## 2016-12-27 DIAGNOSIS — N920 Excessive and frequent menstruation with regular cycle: Secondary | ICD-10-CM | POA: Diagnosis not present

## 2016-12-27 DIAGNOSIS — E1165 Type 2 diabetes mellitus with hyperglycemia: Secondary | ICD-10-CM | POA: Diagnosis not present

## 2016-12-27 DIAGNOSIS — E785 Hyperlipidemia, unspecified: Secondary | ICD-10-CM | POA: Diagnosis not present

## 2016-12-27 DIAGNOSIS — Z0000 Encounter for general adult medical examination without abnormal findings: Secondary | ICD-10-CM | POA: Diagnosis not present

## 2016-12-27 DIAGNOSIS — Z23 Encounter for immunization: Secondary | ICD-10-CM | POA: Diagnosis not present

## 2017-02-20 DIAGNOSIS — R635 Abnormal weight gain: Secondary | ICD-10-CM | POA: Diagnosis not present

## 2017-02-20 DIAGNOSIS — Z1211 Encounter for screening for malignant neoplasm of colon: Secondary | ICD-10-CM | POA: Diagnosis not present

## 2017-02-20 DIAGNOSIS — R195 Other fecal abnormalities: Secondary | ICD-10-CM | POA: Diagnosis not present

## 2017-02-26 ENCOUNTER — Ambulatory Visit: Payer: PPO | Admitting: Obstetrics and Gynecology

## 2017-03-15 ENCOUNTER — Ambulatory Visit: Payer: Medicare HMO | Admitting: Obstetrics and Gynecology

## 2017-03-15 ENCOUNTER — Encounter (HOSPITAL_COMMUNITY): Payer: Self-pay

## 2017-03-15 ENCOUNTER — Encounter: Payer: Self-pay | Admitting: Obstetrics and Gynecology

## 2017-03-15 VITALS — BP 141/78 | HR 81 | Ht 62.0 in | Wt 209.0 lb

## 2017-03-15 DIAGNOSIS — N950 Postmenopausal bleeding: Secondary | ICD-10-CM

## 2017-03-15 DIAGNOSIS — Z3202 Encounter for pregnancy test, result negative: Secondary | ICD-10-CM | POA: Diagnosis not present

## 2017-03-15 LAB — POCT URINE PREGNANCY: Preg Test, Ur: NEGATIVE

## 2017-03-15 MED ORDER — MISOPROSTOL 200 MCG PO TABS: ORAL_TABLET | ORAL | 1 refills | Status: DC

## 2017-03-15 NOTE — Progress Notes (Signed)
NGYN patients presents for problem visit. Pt states she has irregular spotting off/on for 5 yrs now.  Pt states the last episode of bleeding was x 3 months ago. On some occasions pt states states she may have to change a pad three times a day. 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

## 2017-03-15 NOTE — Progress Notes (Signed)
71 yo with BMI 38 who is referred for the evaluation of postmenopausal vaginal bleeding. Patient reports a 5 year history of intermittent vaginal bleeding. She states that the bleeding occurs 3-4 times a year. Patient states that the bleeding is heavy enough that she needs to change pads 2-3 times daily and last 5 days.. She denies any pelvic pain or abnormal discharge. Patient is not sexually active. She denies dysuria or constipation  Past Medical History:  Diagnosis Date  . Arthritis   . Diabetes mellitus   . Hypercholesteremia   . Hypertension   . Post-menopausal bleeding    Past Surgical History:  Procedure Laterality Date  . CATARACT EXTRACTION    . DILATION AND CURETTAGE OF UTERUS    . KNEE ARTHROSCOPY     Family History  Problem Relation Age of Onset  . Hypertension Mother   . Hypertension Father   . Cancer Father        ?lung  . Diabetes Sister    Social History   Tobacco Use  . Smoking status: Former Smoker    Types: Cigarettes  . Smokeless tobacco: Never Used  Substance Use Topics  . Alcohol use: No  . Drug use: No   ROS See pertinent in HPI  Blood pressure (!) 141/78, pulse 81, height 5\' 2"  (1.575 m), weight 209 lb (94.8 kg). GENERAL: Well-developed, well-nourished female in no acute distress.  ABDOMEN: Soft, nontender, nondistended. No organomegaly. PELVIC: Normal external female genitalia. Vagina is pale and atrophic.  Normal discharge. Normal appearing cervix. Uterus is normal in size. No adnexal mass or tenderness. EXTREMITIES: No cyanosis, clubbing, or edema, 2+ distal pulses.  A/P 71 yo with postmenopausal vaginal bleeding - Pelvic ultrasound ordered - Discussed the benefits of endometrial biopsy ENDOMETRIAL BIOPSY     The indications for endometrial biopsy were reviewed.   Risks of the biopsy including cramping, bleeding, infection, uterine perforation, inadequate specimen and need for additional procedures  were discussed. The patient states she  understands and agrees to undergo procedure today. Consent was signed. Time out was performed. Urine HCG was negative. A sterile speculum was placed in the patient's vagina and the cervix was prepped with Betadine. A single-toothed tenaculum was placed on the anterior lip of the cervix to stabilize it. Attempts were made to dilate the cervix and introduce the Pipelle but patient could not tolerate the procedure.  - Patient will be scheduled for D&C with hysteroscopy. Patient instructed to take cytotec the night prior to the surgery to assist with cervical stenosis - Patient will be contacted with date and time of the surgery

## 2017-04-02 DIAGNOSIS — I10 Essential (primary) hypertension: Secondary | ICD-10-CM | POA: Diagnosis not present

## 2017-04-02 DIAGNOSIS — E559 Vitamin D deficiency, unspecified: Secondary | ICD-10-CM | POA: Diagnosis not present

## 2017-04-02 DIAGNOSIS — E785 Hyperlipidemia, unspecified: Secondary | ICD-10-CM | POA: Diagnosis not present

## 2017-04-02 DIAGNOSIS — M1990 Unspecified osteoarthritis, unspecified site: Secondary | ICD-10-CM | POA: Diagnosis not present

## 2017-04-02 DIAGNOSIS — E669 Obesity, unspecified: Secondary | ICD-10-CM | POA: Diagnosis not present

## 2017-04-02 DIAGNOSIS — E119 Type 2 diabetes mellitus without complications: Secondary | ICD-10-CM | POA: Diagnosis not present

## 2017-04-06 DIAGNOSIS — E785 Hyperlipidemia, unspecified: Secondary | ICD-10-CM | POA: Diagnosis not present

## 2017-04-06 DIAGNOSIS — N950 Postmenopausal bleeding: Secondary | ICD-10-CM | POA: Diagnosis not present

## 2017-04-06 DIAGNOSIS — E1165 Type 2 diabetes mellitus with hyperglycemia: Secondary | ICD-10-CM | POA: Diagnosis not present

## 2017-04-16 ENCOUNTER — Ambulatory Visit (HOSPITAL_COMMUNITY): Payer: Medicare HMO

## 2017-04-19 ENCOUNTER — Ambulatory Visit (HOSPITAL_COMMUNITY)
Admission: RE | Admit: 2017-04-19 | Discharge: 2017-04-19 | Disposition: A | Discharge status: Home / Self Care | Payer: Medicare HMO | Source: Ambulatory Visit | Attending: Obstetrics and Gynecology | Admitting: Obstetrics and Gynecology

## 2017-04-19 ENCOUNTER — Encounter (INDEPENDENT_AMBULATORY_CARE_PROVIDER_SITE_OTHER): Payer: Self-pay

## 2017-04-19 DIAGNOSIS — N950 Postmenopausal bleeding: Secondary | ICD-10-CM | POA: Insufficient documentation

## 2017-04-19 DIAGNOSIS — D259 Leiomyoma of uterus, unspecified: Secondary | ICD-10-CM | POA: Diagnosis not present

## 2017-04-24 ENCOUNTER — Telehealth: Payer: Self-pay | Admitting: *Deleted

## 2017-04-24 NOTE — Telephone Encounter (Signed)
Pt called to office with questions regarding her upcoming procedure.   Return call to pt.  Pt states that she has received phone call from pre op about procedure. Pt still has some questions.  Pt made aware that she may have lab work done same day.  Pt states that her Granddaughter will be with her at time of procedure.  Pt would like limited information to be given to Granddaughter if case does not go well.  Pt states at this time it is undetermined if her son will be able to accompany her at time of procedure.  Pt would like to have her Son contacted in any emergent need, he is listed as emergent contact in chart.  Pt was advise that she can expect to be at hospital for several hours and should be able to go home if in stable condition from recovery.  Pt made aware that this information would be sent to provider to make aware of emergency contact needs. Pt made aware she may call office any time if she has other questions/concerns.

## 2017-04-25 ENCOUNTER — Other Ambulatory Visit: Payer: Self-pay

## 2017-04-25 ENCOUNTER — Encounter (HOSPITAL_BASED_OUTPATIENT_CLINIC_OR_DEPARTMENT_OTHER): Payer: Self-pay | Admitting: *Deleted

## 2017-04-25 NOTE — Progress Notes (Signed)
Instructed patient to stop Aspirin and all vitamins 5 days prior to surgery. Pt states she does not want any medial information shared with anyone except her son.  Informed her we would need to review discharge instructions with grand daughter as that will be her ride. She was okay with that.

## 2017-04-27 ENCOUNTER — Encounter (HOSPITAL_BASED_OUTPATIENT_CLINIC_OR_DEPARTMENT_OTHER)
Admission: RE | Admit: 2017-04-27 | Discharge: 2017-04-27 | Disposition: A | Discharge status: Home / Self Care | Payer: Medicare HMO | Source: Ambulatory Visit | Attending: Obstetrics and Gynecology | Admitting: Obstetrics and Gynecology

## 2017-04-27 DIAGNOSIS — Z01810 Encounter for preprocedural cardiovascular examination: Secondary | ICD-10-CM | POA: Insufficient documentation

## 2017-04-27 DIAGNOSIS — Z01812 Encounter for preprocedural laboratory examination: Secondary | ICD-10-CM | POA: Diagnosis not present

## 2017-04-27 DIAGNOSIS — I10 Essential (primary) hypertension: Secondary | ICD-10-CM | POA: Insufficient documentation

## 2017-04-27 LAB — CBC
HCT: 39.4 % (ref 36.0–46.0)
Hemoglobin: 12 g/dL (ref 12.0–15.0)
MCH: 27.3 pg (ref 26.0–34.0)
MCHC: 30.5 g/dL (ref 30.0–36.0)
MCV: 89.7 fL (ref 78.0–100.0)
Platelets: 235 10*3/uL (ref 150–400)
RBC: 4.39 MIL/uL (ref 3.87–5.11)
RDW: 17.5 % — ABNORMAL HIGH (ref 11.5–15.5)
WBC: 13.5 10*3/uL — ABNORMAL HIGH (ref 4.0–10.5)

## 2017-04-27 LAB — BASIC METABOLIC PANEL
Anion gap: 9 (ref 5–15)
BUN: 14 mg/dL (ref 6–20)
CO2: 30 mmol/L (ref 22–32)
Calcium: 9.7 mg/dL (ref 8.9–10.3)
Chloride: 102 mmol/L (ref 101–111)
Creatinine, Ser: 0.82 mg/dL (ref 0.44–1.00)
GFR calc Af Amer: >60 mL/min (ref >60)
GFR calc non Af Amer: >60 mL/min (ref >60)
Glucose, Bld: 154 mg/dL — ABNORMAL HIGH (ref 65–99)
Potassium: 4.3 mmol/L (ref 3.5–5.1)
Sodium: 141 mmol/L (ref 135–145)

## 2017-05-01 NOTE — H&P (Addendum)
Joanna Sandoval is an 72 y.o. female with BMI 88 who is here for scheduled dilatation and curettage with hysteroscopy due to postmenopausal vaginal bleeding. Patient reports a 5 year history of intermittent vaginal bleeding. She states that the bleeding occurs 3-4 times a year. Patient states that the bleeding is heavy enough that she needs to change pads 2-3 times daily and last 5 days. She denies any pelvic pain or abnormal discharge.   Pertinent Gynecological History: Menses: post-menopausal Bleeding: post menopausal bleeding Contraception: none DES exposure: denies Blood transfusions: none Sexually transmitted diseases: no past history Previous GYN Procedures: DNC  Last mammogram: normal Date: 04/2016 Last pap: no longer indicated   Menstrual History: No LMP recorded. Patient is postmenopausal.    Past Medical History:  Diagnosis Date  . Arthritis   . Diabetes mellitus   . Hypercholesteremia   . Hypertension   . Post-menopausal bleeding     Past Surgical History:  Procedure Laterality Date  . CATARACT EXTRACTION    . DILATION AND CURETTAGE OF UTERUS    . KNEE ARTHROSCOPY      Family History  Problem Relation Age of Onset  . Hypertension Mother   . Hypertension Father   . Cancer Father        ?lung  . Diabetes Sister     Social History:  reports that she has quit smoking. Her smoking use included cigarettes. she has never used smokeless tobacco. She reports that she does not drink alcohol or use drugs.  Allergies: No Known Allergies  Medications Prior to Admission  Medication Sig Dispense Refill Last Dose  . aspirin 81 MG tablet Take 81 mg by mouth daily.   Past Week at Unknown time  . atenolol (TENORMIN) 100 MG tablet Take 50 mg by mouth daily.    05/02/2017  . atorvastatin (LIPITOR) 40 MG tablet Take 40 mg by mouth daily. Take 1/2 pill at bedtime   05/01/2017 at Unknown time  . D3-50 50000 units capsule   0 Past Week at Unknown time  . glimepiride (AMARYL) 2 MG  tablet Take 2 mg by mouth daily before breakfast.   05/01/2017 at Unknown time  . losartan (COZAAR) 50 MG tablet Take 50 mg by mouth daily.   05/02/2017 at Unknown time  . misoprostol (CYTOTEC) 200 MCG tablet insert four tablets vaginally the night prior to your surgery 4 tablet 1 05/01/2017 at Unknown time  . sitaGLIPtin ([name]) 50 MG tablet Take 50 mg by mouth daily.   05/01/2017 at Unknown time  . meloxicam (MOBIC) 7.5 MG tablet Take 1 tablet (7.5 mg total) by mouth daily. (Patient not taking: Reported on 03/15/2017) 30 tablet 0 Not Taking  . promethazine (PHENERGAN) 25 MG tablet Take 1 tablet (25 mg total) by mouth every 6 (six) hours as needed for nausea. 12 tablet 0     ROS See pertinent in HPI Blood pressure 139/61, pulse 70, temperature 98.3 F (36.8 C), temperature source Oral, resp. rate 18, height 5\' 2"  (1.575 m), weight 210 lb 6.4 oz (95.4 kg), SpO2 98 %. Physical Exam GENERAL: Well-developed, well-nourished female in no acute distress.  HEENT: Normocephalic, atraumatic. Sclerae anicteric.  NECK: Supple. Normal thyroid.  LUNGS: Clear to auscultation bilaterally.  HEART: Regular rate and rhythm. ABDOMEN: Soft, nontender, nondistended.  PELVIC: Deferred to OR EXTREMITIES: No cyanosis, clubbing, or edema, 2+ distal pulses.  Results for orders placed or performed during the hospital encounter of 05/02/17 (from the past 24 hour(s))  Glucose, capillary  Status: Abnormal   Collection Time: 05/02/17 11:33 AM  Result Value Ref Range   Glucose-Capillary 124 (H) 65 - 99 mg/dL    No results found.  Assessment/Plan: 72 yo with postmenopausal vaginal bleeding here for D&C with hysteroscopy - Risks, benefits and alternatives were explained including but not limited to risks of bleeding, infection, uterine perforation and damage to adjacent organs. Patient verbalized understanding and all questions were answered - Consent signed  Lenzie Sandler 05/02/2017, 11:48 AM

## 2017-05-02 ENCOUNTER — Other Ambulatory Visit: Payer: Self-pay

## 2017-05-02 ENCOUNTER — Ambulatory Visit (HOSPITAL_BASED_OUTPATIENT_CLINIC_OR_DEPARTMENT_OTHER)
Admission: RE | Admit: 2017-05-02 | Discharge: 2017-05-02 | Disposition: A | Discharge status: Home / Self Care | Payer: Medicare HMO | Source: Ambulatory Visit | Attending: Obstetrics and Gynecology | Admitting: Obstetrics and Gynecology

## 2017-05-02 ENCOUNTER — Encounter (HOSPITAL_BASED_OUTPATIENT_CLINIC_OR_DEPARTMENT_OTHER)
Admission: RE | Disposition: A | Discharge status: Home / Self Care | Payer: Self-pay | Source: Ambulatory Visit | Attending: Obstetrics and Gynecology

## 2017-05-02 ENCOUNTER — Encounter (HOSPITAL_BASED_OUTPATIENT_CLINIC_OR_DEPARTMENT_OTHER): Payer: Self-pay | Admitting: *Deleted

## 2017-05-02 ENCOUNTER — Ambulatory Visit (HOSPITAL_BASED_OUTPATIENT_CLINIC_OR_DEPARTMENT_OTHER): Payer: Medicare HMO | Admitting: Certified Registered"

## 2017-05-02 DIAGNOSIS — E7800 Pure hypercholesterolemia, unspecified: Secondary | ICD-10-CM | POA: Insufficient documentation

## 2017-05-02 DIAGNOSIS — I10 Essential (primary) hypertension: Secondary | ICD-10-CM | POA: Diagnosis not present

## 2017-05-02 DIAGNOSIS — Z87891 Personal history of nicotine dependence: Secondary | ICD-10-CM | POA: Diagnosis not present

## 2017-05-02 DIAGNOSIS — Z7984 Long term (current) use of oral hypoglycemic drugs: Secondary | ICD-10-CM | POA: Insufficient documentation

## 2017-05-02 DIAGNOSIS — M1990 Unspecified osteoarthritis, unspecified site: Secondary | ICD-10-CM | POA: Diagnosis not present

## 2017-05-02 DIAGNOSIS — N840 Polyp of corpus uteri: Secondary | ICD-10-CM | POA: Insufficient documentation

## 2017-05-02 DIAGNOSIS — E119 Type 2 diabetes mellitus without complications: Secondary | ICD-10-CM | POA: Insufficient documentation

## 2017-05-02 DIAGNOSIS — Z79899 Other long term (current) drug therapy: Secondary | ICD-10-CM | POA: Insufficient documentation

## 2017-05-02 DIAGNOSIS — Z7982 Long term (current) use of aspirin: Secondary | ICD-10-CM | POA: Diagnosis not present

## 2017-05-02 DIAGNOSIS — N950 Postmenopausal bleeding: Secondary | ICD-10-CM | POA: Diagnosis not present

## 2017-05-02 DIAGNOSIS — N858 Other specified noninflammatory disorders of uterus: Secondary | ICD-10-CM | POA: Diagnosis not present

## 2017-05-02 DIAGNOSIS — D259 Leiomyoma of uterus, unspecified: Secondary | ICD-10-CM | POA: Diagnosis not present

## 2017-05-02 HISTORY — PX: HYSTEROSCOPY WITH D & C: SHX1775

## 2017-05-02 LAB — GLUCOSE, CAPILLARY
Glucose-Capillary: 124 mg/dL — ABNORMAL HIGH (ref 65–99)
Glucose-Capillary: 122 mg/dL — ABNORMAL HIGH (ref 65–99)

## 2017-05-02 SURGERY — DILATATION AND CURETTAGE /HYSTEROSCOPY
Anesthesia: General | Site: Uterus

## 2017-05-02 MED ORDER — ROCURONIUM BROMIDE 10 MG/ML (PF) SYRINGE
PREFILLED_SYRINGE | INTRAVENOUS | Status: AC
  Filled 2017-05-02: qty 5

## 2017-05-02 MED ORDER — CHLOROPROCAINE HCL 1 % IJ SOLN
INTRAMUSCULAR | Status: DC | PRN
  Administered 2017-05-02: 10 mL

## 2017-05-02 MED ORDER — DEXAMETHASONE SODIUM PHOSPHATE 10 MG/ML IJ SOLN
INTRAMUSCULAR | Status: AC
  Filled 2017-05-02: qty 1

## 2017-05-02 MED ORDER — HYDROMORPHONE HCL 1 MG/ML IJ SOLN: 0.2500 mg | INTRAMUSCULAR | Status: DC | PRN

## 2017-05-02 MED ORDER — ONDANSETRON HCL 4 MG/2ML IJ SOLN
INTRAMUSCULAR | Status: DC | PRN
  Administered 2017-05-02: 4 mg via INTRAVENOUS

## 2017-05-02 MED ORDER — OXYCODONE-ACETAMINOPHEN 5-325 MG PO TABS: 1.0000 | ORAL_TABLET | Freq: Four times a day (QID) | ORAL | 0 refills | Status: DC | PRN

## 2017-05-02 MED ORDER — SCOPOLAMINE 1 MG/3DAYS TD PT72: 1.0000 | MEDICATED_PATCH | Freq: Once | TRANSDERMAL | Status: DC | PRN

## 2017-05-02 MED ORDER — FENTANYL CITRATE (PF) 100 MCG/2ML IJ SOLN
50.0000 ug | INTRAMUSCULAR | Status: DC | PRN
  Administered 2017-05-02 (×2): 50 ug via INTRAVENOUS

## 2017-05-02 MED ORDER — NEOSTIGMINE METHYLSULFATE 5 MG/5ML IV SOSY
PREFILLED_SYRINGE | INTRAVENOUS | Status: AC
  Filled 2017-05-02: qty 5

## 2017-05-02 MED ORDER — LIDOCAINE 2% (20 MG/ML) 5 ML SYRINGE
INTRAMUSCULAR | Status: AC
  Filled 2017-05-02: qty 5

## 2017-05-02 MED ORDER — FENTANYL CITRATE (PF) 100 MCG/2ML IJ SOLN
INTRAMUSCULAR | Status: AC
  Filled 2017-05-02: qty 2

## 2017-05-02 MED ORDER — LACTATED RINGERS IV SOLN
INTRAVENOUS | Status: DC
  Administered 2017-05-02: 12:00:00 via INTRAVENOUS

## 2017-05-02 MED ORDER — PROPOFOL 10 MG/ML IV BOLUS
INTRAVENOUS | Status: DC | PRN
  Administered 2017-05-02: 150 mg via INTRAVENOUS

## 2017-05-02 MED ORDER — LIDOCAINE HCL (CARDIAC) 20 MG/ML IV SOLN
INTRAVENOUS | Status: DC | PRN
  Administered 2017-05-02: 100 mg via INTRAVENOUS

## 2017-05-02 MED ORDER — ONDANSETRON HCL 4 MG/2ML IJ SOLN
INTRAMUSCULAR | Status: AC
  Filled 2017-05-02: qty 2

## 2017-05-02 MED ORDER — IBUPROFEN 600 MG PO TABS: 600.0000 mg | ORAL_TABLET | Freq: Four times a day (QID) | ORAL | 3 refills | Status: DC | PRN

## 2017-05-02 MED ORDER — ONDANSETRON HCL 4 MG/2ML IJ SOLN: 4.0000 mg | Freq: Once | INTRAMUSCULAR | Status: DC | PRN

## 2017-05-02 MED ORDER — PROPOFOL 10 MG/ML IV BOLUS
INTRAVENOUS | Status: AC
  Filled 2017-05-02: qty 20

## 2017-05-02 MED ORDER — MEPERIDINE HCL 25 MG/ML IJ SOLN: 6.2500 mg | INTRAMUSCULAR | Status: DC | PRN

## 2017-05-02 MED ORDER — SODIUM CHLORIDE 0.9 % IR SOLN
Status: DC | PRN
  Administered 2017-05-02: 3000 mL

## 2017-05-02 MED ORDER — MIDAZOLAM HCL 2 MG/2ML IJ SOLN
INTRAMUSCULAR | Status: AC
  Filled 2017-05-02: qty 2

## 2017-05-02 MED ORDER — MIDAZOLAM HCL 2 MG/2ML IJ SOLN: 1.0000 mg | INTRAMUSCULAR | Status: DC | PRN

## 2017-05-02 MED ORDER — PHENYLEPHRINE HCL 10 MG/ML IJ SOLN
INTRAMUSCULAR | Status: DC | PRN
  Administered 2017-05-02 (×2): 120 ug via INTRAVENOUS

## 2017-05-02 SURGICAL SUPPLY — 15 items
BRIEF STRETCH FOR OB PAD XXL (UNDERPADS AND DIAPERS) ×2 IMPLANT
CATH ROBINSON RED A/P 16FR (CATHETERS) ×2 IMPLANT
CONTAINER PREFILL 10% NBF 60ML (FORM) ×4 IMPLANT
GLOVE BIOGEL PI IND STRL 6.5 (GLOVE) ×1 IMPLANT
GLOVE BIOGEL PI IND STRL 7.0 (GLOVE) ×1 IMPLANT
GLOVE BIOGEL PI INDICATOR 6.5 (GLOVE) ×1
GLOVE BIOGEL PI INDICATOR 7.0 (GLOVE) ×1
GLOVE SURG SS PI 6.0 STRL IVOR (GLOVE) ×2 IMPLANT
GOWN STRL REUS W/TWL LRG LVL3 (GOWN DISPOSABLE) ×4 IMPLANT
PACK VAGINAL MINOR WOMEN LF (CUSTOM PROCEDURE TRAY) ×2 IMPLANT
PAD OB MATERNITY 4.3X12.25 (PERSONAL CARE ITEMS) ×2 IMPLANT
PAD PREP 24X48 CUFFED NSTRL (MISCELLANEOUS) ×2 IMPLANT
TOWEL OR 17X24 6PK STRL BLUE (TOWEL DISPOSABLE) ×4 IMPLANT
TUBING AQUILEX INFLOW (TUBING) ×2 IMPLANT
TUBING AQUILEX OUTFLOW (TUBING) ×2 IMPLANT

## 2017-05-02 NOTE — Transfer of Care (Signed)
Immediate Anesthesia Transfer of Care Note  Patient: Joanna Sandoval  Procedure(s) Performed: DILATATION AND CURETTAGE /HYSTEROSCOPY (N/A Uterus)  Patient Location: PACU  Anesthesia Type:General  Level of Consciousness: awake, alert  and oriented  Airway & Oxygen Therapy: Patient Spontanous Breathing and Patient connected to face mask oxygen  Post-op Assessment: Report given to RN and Post -op Vital signs reviewed and stable  Post vital signs: Reviewed and stable  Last Vitals:  Vitals:   05/02/17 1131  BP: 139/61  Pulse: 70  Resp: 18  Temp: 36.8 C  SpO2: 98%    Last Pain:  Vitals:   05/02/17 1131  TempSrc: Oral  PainSc: 7       Patients Stated Pain Goal: 4 (81/85/63 1497)  Complications: No apparent anesthesia complications

## 2017-05-02 NOTE — Anesthesia Procedure Notes (Signed)
Procedure Name: LMA Insertion Performed by: Verita Lamb, CRNA Pre-anesthesia Checklist: Patient identified, Emergency Drugs available, Suction available, Patient being monitored and Timeout performed Preoxygenation: Pre-oxygenation with 100% oxygen Induction Type: IV induction Ventilation: Mask ventilation without difficulty LMA: LMA inserted LMA Size: 4.0 Placement Confirmation: positive ETCO2,  CO2 detector and breath sounds checked- equal and bilateral

## 2017-05-02 NOTE — Op Note (Signed)
PREOPERATIVE DIAGNOSIS:  Postmenopausal. POSTOPERATIVE DIAGNOSIS: The same PROCEDURE: Hysteroscopy, Dilation and Curettage. SURGEON:  Dr. Mora Bellman   INDICATIONS: 73 y.o. [id number]  here for scheduled surgery for PMB.   Risks of surgery were discussed with the patient including but not limited to: bleeding which may require transfusion; infection which may require antibiotics; injury to uterus or surrounding organs; intrauterine scarring which may impair future fertility; need for additional procedures including laparotomy or laparoscopy; and other postoperative/anesthesia complications. Written informed consent was obtained.    FINDINGS:  A 6-week size uterus.  Diffuse proliferative endometrium, small polypoid mass.  Normal ostia bilaterally.  ANESTHESIA:   General, paracervical block. INTRAVENOUS FLUIDS:  600 ml of LR FLUID DEFICITS:  50 ml of LR ESTIMATED BLOOD LOSS:  Less than 20 ml SPECIMENS: Endometrial curettings sent to pathology COMPLICATIONS:  None immediate.  PROCEDURE DETAILS:  The patient received intravenous antibiotics while in the preoperative area.  She was then taken to the operating room where general anesthesia was administered and was found to be adequate.  After an adequate timeout was performed, she was placed in the dorsal lithotomy position and examined; then prepped and draped in the sterile manner.   Her bladder was catheterized for an unmeasured amount of clear, yellow urine. A speculum was then placed in the patient's vagina and a single tooth tenaculum was applied to the anterior lip of the cervix.   A paracervical block using 10 ml of 0.5% Marcaine was administered.  The cervix was sounded to 7 cm and dilated manually with Hagar dilators to accommodate the 5 mm diagnostic hysteroscope.  Once the cervix was dilated, the hysteroscope was inserted under direct visualization using LR as a suspension medium.  The uterine cavity was carefully examined, both ostia were  recognized, and diffusely proliferative endometrium was noted.   After further careful visualization of the uterine cavity, the hysteroscope was removed under direct visualization.  A sharp curettage was then performed to obtain a moderate amount of endometrial curettings.  The tenaculum was removed from the anterior lip of the cervix and the vaginal speculum was removed after noting good hemostasis.  The patient tolerated the procedure well and was taken to the recovery area awake, extubated and in stable condition.

## 2017-05-02 NOTE — Anesthesia Postprocedure Evaluation (Signed)
Anesthesia Post Note  Patient: Joanna Sandoval  Procedure(s) Performed: DILATATION AND CURETTAGE /HYSTEROSCOPY (N/A Uterus)     Patient location during evaluation: PACU Anesthesia Type: General Level of consciousness: awake and alert Pain management: pain level controlled Vital Signs Assessment: post-procedure vital signs reviewed and stable Respiratory status: spontaneous breathing, nonlabored ventilation, respiratory function stable and patient connected to nasal cannula oxygen Cardiovascular status: blood pressure returned to baseline and stable Postop Assessment: no apparent nausea or vomiting Anesthetic complications: no    Last Vitals:  Vitals:   05/02/17 1330 05/02/17 1345  BP: 140/66 (!) 142/65  Pulse: 69 67  Resp: 17 16  Temp:    SpO2: 100% 98%    Last Pain:  Vitals:   05/02/17 1345  TempSrc:   PainSc: 0-No pain                 Pammie Chirino DAVID

## 2017-05-02 NOTE — Anesthesia Preprocedure Evaluation (Signed)
Anesthesia Evaluation  Patient identified by MRN, date of birth, ID band Patient awake    Reviewed: Allergy & Precautions, NPO status , Patient's Chart, lab work & pertinent test results  Airway Mallampati: I  TM Distance: >3 FB Neck ROM: Full    Dental   Pulmonary former smoker,    Pulmonary exam normal        Cardiovascular hypertension, Pt. on medications Normal cardiovascular exam     Neuro/Psych    GI/Hepatic   Endo/Other  diabetes, Type 2, Oral Hypoglycemic Agents  Renal/GU      Musculoskeletal   Abdominal   Peds  Hematology   Anesthesia Other Findings   Reproductive/Obstetrics                             Anesthesia Physical Anesthesia Plan  ASA: II  Anesthesia Plan: General   Post-op Pain Management:    Induction: Intravenous  PONV Risk Score and Plan: 3 and Dexamethasone, Ondansetron and Treatment may vary due to age or medical condition  Airway Management Planned: LMA  Additional Equipment:   Intra-op Plan:   Post-operative Plan: Extubation in OR  Informed Consent: I have reviewed the patients History and Physical, chart, labs and discussed the procedure including the risks, benefits and alternatives for the proposed anesthesia with the patient or authorized representative who has indicated his/her understanding and acceptance.     Plan Discussed with: CRNA and Surgeon  Anesthesia Plan Comments:         Anesthesia Quick Evaluation

## 2017-05-02 NOTE — Discharge Instructions (Signed)
Dilation and Curettage or Vacuum Curettage Dilation and curettage (D&C) and vacuum curettage are minor procedures. A D&C involves stretching (dilation) the cervix and scraping (curettage) the inside lining of the uterus (endometrium). During a D&C, tissue is gently scraped from the endometrium, starting from the top portion of the uterus down to the lowest part of the uterus (cervix). During a vacuum curettage, the lining and tissue in the uterus are removed with the use of gentle suction. Curettage may be performed to either diagnose or treat a problem. As a diagnostic procedure, curettage is performed to examine tissues from the uterus. A diagnostic curettage may be done if you have:  Irregular bleeding in the uterus.  Bleeding with the development of clots.  Spotting between menstrual periods.  Prolonged menstrual periods or other abnormal bleeding.  Bleeding after menopause.  No menstrual period (amenorrhea).  A change in size and shape of the uterus.  Abnormal endometrial cells discovered during a Pap test.    Post Anesthesia Home Care Instructions  Activity: Get plenty of rest for the remainder of the day. A responsible individual must stay with you for 24 hours following the procedure.  For the next 24 hours, DO NOT: -Drive a car -Paediatric nurse -Drink alcoholic beverages -Take any medication unless instructed by your physician -Make any legal decisions or sign important papers.  Meals: Start with liquid foods such as gelatin or soup. Progress to regular foods as tolerated. Avoid greasy, spicy, heavy foods. If nausea and/or vomiting occur, drink only clear liquids until the nausea and/or vomiting subsides. Call your physician if vomiting continues.  Special Instructions/Symptoms: Your throat may feel dry or sore from the anesthesia or the breathing tube placed in your throat during surgery. If this causes discomfort, gargle with warm salt water. The discomfort should  disappear within 24 hours.  If you had a scopolamine patch placed behind your ear for the management of post- operative nausea and/or vomiting:  1. The medication in the patch is effective for 72 hours, after which it should be removed.  Wrap patch in a tissue and discard in the trash. Wash hands thoroughly with soap and water. 2. You may remove the patch earlier than 72 hours if you experience unpleasant side effects which may include dry mouth, dizziness or visual disturbances. 3. Avoid touching the patch. Wash your hands with soap and water after contact with the patch.    As a treatment procedure, curettage may be performed for the following reasons:  Removal of an IUD (intrauterine device).  Removal of retained placenta after giving birth.  Abortion.  Miscarriage.  Removal of endometrial polyps.  Removal of uncommon types of noncancerous lumps (fibroids).  Tell a health care provider about:  Any allergies you have, including allergies to prescribed medicine or latex.  All medicines you are taking, including vitamins, herbs, eye drops, creams, and over-the-counter medicines. This is especially important if you take any blood-thinning medicine. Bring a list of all of your medicines to your appointment.  Any problems you or family members have had with anesthetic medicines.  Any blood disorders you have.  Any surgeries you have had.  Your medical history and any medical conditions you have.  Whether you are pregnant or may be pregnant.  Recent vaginal infections you have had.  Recent menstrual periods, bleeding problems you have had, and what form of birth control (contraception) you use. What are the risks? Generally, this is a safe procedure. However, problems may occur, including:  Infection.  Heavy vaginal bleeding.  Allergic reactions to medicines.  Damage to the cervix or other structures or organs.  Development of scar tissue (adhesions) inside the  uterus, which can cause abnormal amounts of menstrual bleeding. This may make it harder to get pregnant in the future.  A hole (perforation) or puncture in the uterine wall. This is rare.  What happens before the procedure? Staying hydrated Follow instructions from your health care provider about hydration, which may include:  Up to 2 hours before the procedure - you may continue to drink clear liquids, such as water, clear fruit juice, black coffee, and plain tea.  Eating and drinking restrictions Follow instructions from your health care provider about eating and drinking, which may include:  8 hours before the procedure - stop eating heavy meals or foods such as meat, fried foods, or fatty foods.  6 hours before the procedure - stop eating light meals or foods, such as toast or cereal.  6 hours before the procedure - stop drinking milk or drinks that contain milk.  2 hours before the procedure - stop drinking clear liquids. If your health care provider told you to take your medicine(s) on the day of your procedure, take them with only a sip of water.  Medicines  Ask your health care provider about: ? Changing or stopping your regular medicines. This is especially important if you are taking diabetes medicines or blood thinners. ? Taking medicines such as aspirin and ibuprofen. These medicines can thin your blood. Do not take these medicines before your procedure if your health care provider instructs you not to.  You may be given antibiotic medicine to help prevent infection. General instructions  For 24 hours before your procedure, do not: ? Douche. ? Use tampons. ? Use medicines, creams, or suppositories in the vagina. ? Have sexual intercourse.  You may be given a pregnancy test on the day of the procedure.  Plan to have someone take you home from the hospital or clinic.  You may have a blood or urine sample taken.  If you will be going home right after the procedure,  plan to have someone with you for 24 hours. What happens during the procedure?  To reduce your risk of infection: ? Your health care team will wash or sanitize their hands. ? Your skin will be washed with soap.  An IV tube will be inserted into one of your veins.  You will be given one of the following: ? A medicine that numbs the area in and around the cervix (local anesthetic). ? A medicine to make you fall asleep (general anesthetic).  You will lie down on your back, with your feet in foot rests (stirrups).  The size and position of your uterus will be checked.  A lubricated instrument (speculum or Sims retractor) will be inserted into the back side of your vagina. The speculum will be used to hold apart the walls of your vagina so your health care provider can see your cervix.  A tool (tenaculum) will be attached to the lip of the cervix to stabilize it.  Your cervix will be softened and dilated. This may be done by: ? Taking a medicine. ? Having tapered dilators or thin rods (laminaria) or gradual widening instruments (tapered dilators) inserted into your cervix.  A small, sharp, curved instrument (curette) will be used to scrape a small amount of tissue or cells from the endometrium or cervical canal. In some cases, gentle suction is applied with  the curette. The curette will then be removed. The cells will be taken to a lab for testing. The procedure may vary among health care providers and hospitals. What happens after the procedure?  You may have mild cramping, backache, pain, and light bleeding or spotting. You may pass small blood clots from your vagina.  You may have to wear compression stockings. These stockings help to prevent blood clots and reduce swelling in your legs.  Your blood pressure, heart rate, breathing rate, and blood oxygen level will be monitored until the medicines you were given have worn off. Summary  Dilation and curettage (D&C) involves stretching  (dilation) the cervix and scraping (curettage) the inside lining of the uterus (endometrium).  After the procedure, you may have mild cramping, backache, pain, and light bleeding or spotting. You may pass small blood clots from your vagina.  Plan to have someone take you home from the hospital or clinic. This information is not intended to replace advice given to you by your health care provider. Make sure you discuss any questions you have with your health care provider. Document Released: 04/03/2005 Document Revised: 12/19/2015 Document Reviewed: 12/19/2015 Elsevier Interactive Patient Education  2018 [employer].

## 2017-05-03 ENCOUNTER — Encounter (HOSPITAL_BASED_OUTPATIENT_CLINIC_OR_DEPARTMENT_OTHER): Payer: Self-pay | Admitting: Obstetrics and Gynecology

## 2017-05-04 ENCOUNTER — Telehealth: Payer: Self-pay

## 2017-05-04 NOTE — Telephone Encounter (Signed)
-----   Message from Mora Bellman, MD sent at 05/04/2017  6:41 AM EST ----- Please inform patient of results of her recent D&C. They showed negative pathology for cancer.   Thanks  [employer]

## 2017-05-04 NOTE — Telephone Encounter (Signed)
Patient notified

## 2017-05-21 ENCOUNTER — Encounter: Payer: Self-pay | Admitting: Obstetrics and Gynecology

## 2017-05-21 ENCOUNTER — Ambulatory Visit: Payer: Medicare HMO | Admitting: Obstetrics and Gynecology

## 2017-05-21 VITALS — BP 112/58 | HR 83 | Wt 212.0 lb

## 2017-05-21 DIAGNOSIS — Z9889 Other specified postprocedural states: Secondary | ICD-10-CM

## 2017-05-21 NOTE — Progress Notes (Signed)
72 yo here for postop check. Patient had a D&C with hysteroscopy on 05/02/2017 for the evaluation of postmenopausal vaginal bleeding. Patient reports feeling well since her surgery with a few days of vaginal spotting following the surgery and nothing since. She denies any cramping pain. She denies pelvic pain  Past Medical History:  Diagnosis Date  . Arthritis   . Diabetes mellitus   . Hypercholesteremia   . Hypertension   . Post-menopausal bleeding    Past Surgical History:  Procedure Laterality Date  . CATARACT EXTRACTION    . DILATION AND CURETTAGE OF UTERUS    . HYSTEROSCOPY W/D&C N/A 05/02/2017   Procedure: DILATATION AND CURETTAGE /HYSTEROSCOPY;  Surgeon: Mora Bellman, MD;  Location: [facility];  Service: Gynecology;  Laterality: N/A;  . KNEE ARTHROSCOPY     Family History  Problem Relation Age of Onset  . Hypertension Mother   . Hypertension Father   . Cancer Father        ?lung  . Diabetes Sister    Social History   Tobacco Use  . Smoking status: Former Smoker    Types: Cigarettes  . Smokeless tobacco: Never Used  Substance Use Topics  . Alcohol use: No  . Drug use: No   ROS See pertinent in HPI  Blood pressure (!) 112/58, pulse 83, weight 212 lb (96.2 kg). GENERAL: Well-developed, well-nourished female in no acute distress.  ABDOMEN: Soft, nontender, nondistended. No organomegaly. EXTREMITIES: No cyanosis, clubbing, or edema, 2+ distal pulses.  A/P 72 yo here for post op check - reviewed pathology results with patient - Advised patient to return if she experiences any further episodes of vaginal bleeding - Patient plans to contact breast center for screening mammogram - RTC prn

## 2017-05-21 NOTE — Progress Notes (Signed)
Presents for [employer]. Reports no problems today.

## 2017-07-05 DIAGNOSIS — I10 Essential (primary) hypertension: Secondary | ICD-10-CM | POA: Diagnosis not present

## 2017-07-05 DIAGNOSIS — N939 Abnormal uterine and vaginal bleeding, unspecified: Secondary | ICD-10-CM | POA: Diagnosis not present

## 2017-07-05 DIAGNOSIS — E785 Hyperlipidemia, unspecified: Secondary | ICD-10-CM | POA: Diagnosis not present

## 2017-07-05 DIAGNOSIS — E1165 Type 2 diabetes mellitus with hyperglycemia: Secondary | ICD-10-CM | POA: Diagnosis not present

## 2017-07-10 DIAGNOSIS — R32 Unspecified urinary incontinence: Secondary | ICD-10-CM | POA: Diagnosis not present

## 2017-07-10 DIAGNOSIS — J309 Allergic rhinitis, unspecified: Secondary | ICD-10-CM | POA: Diagnosis not present

## 2017-07-10 DIAGNOSIS — E119 Type 2 diabetes mellitus without complications: Secondary | ICD-10-CM | POA: Diagnosis not present

## 2017-07-10 DIAGNOSIS — Z6838 Body mass index (BMI) 38.0-38.9, adult: Secondary | ICD-10-CM | POA: Diagnosis not present

## 2017-07-10 DIAGNOSIS — J42 Unspecified chronic bronchitis: Secondary | ICD-10-CM | POA: Diagnosis not present

## 2017-07-10 DIAGNOSIS — R69 Illness, unspecified: Secondary | ICD-10-CM | POA: Diagnosis not present

## 2017-07-10 DIAGNOSIS — G8929 Other chronic pain: Secondary | ICD-10-CM | POA: Diagnosis not present

## 2017-07-10 DIAGNOSIS — I10 Essential (primary) hypertension: Secondary | ICD-10-CM | POA: Diagnosis not present

## 2017-07-10 DIAGNOSIS — K08409 Partial loss of teeth, unspecified cause, unspecified class: Secondary | ICD-10-CM | POA: Diagnosis not present

## 2017-08-22 DIAGNOSIS — H5201 Hypermetropia, right eye: Secondary | ICD-10-CM | POA: Diagnosis not present

## 2017-08-22 DIAGNOSIS — H11153 Pinguecula, bilateral: Secondary | ICD-10-CM | POA: Diagnosis not present

## 2017-08-22 DIAGNOSIS — H18413 Arcus senilis, bilateral: Secondary | ICD-10-CM | POA: Diagnosis not present

## 2017-08-22 DIAGNOSIS — Z961 Presence of intraocular lens: Secondary | ICD-10-CM | POA: Diagnosis not present

## 2017-08-22 DIAGNOSIS — H524 Presbyopia: Secondary | ICD-10-CM | POA: Diagnosis not present

## 2017-08-22 DIAGNOSIS — H11423 Conjunctival edema, bilateral: Secondary | ICD-10-CM | POA: Diagnosis not present

## 2017-08-22 DIAGNOSIS — Z7984 Long term (current) use of oral hypoglycemic drugs: Secondary | ICD-10-CM | POA: Diagnosis not present

## 2017-08-22 DIAGNOSIS — Z9849 Cataract extraction status, unspecified eye: Secondary | ICD-10-CM | POA: Diagnosis not present

## 2017-08-22 DIAGNOSIS — H40003 Preglaucoma, unspecified, bilateral: Secondary | ICD-10-CM | POA: Diagnosis not present

## 2017-08-22 DIAGNOSIS — H52223 Regular astigmatism, bilateral: Secondary | ICD-10-CM | POA: Diagnosis not present

## 2017-10-05 DIAGNOSIS — E1165 Type 2 diabetes mellitus with hyperglycemia: Secondary | ICD-10-CM | POA: Diagnosis not present

## 2017-10-05 DIAGNOSIS — E669 Obesity, unspecified: Secondary | ICD-10-CM | POA: Diagnosis not present

## 2017-10-05 DIAGNOSIS — E785 Hyperlipidemia, unspecified: Secondary | ICD-10-CM | POA: Diagnosis not present

## 2017-10-05 DIAGNOSIS — I10 Essential (primary) hypertension: Secondary | ICD-10-CM | POA: Diagnosis not present

## 2017-10-23 DIAGNOSIS — H11153 Pinguecula, bilateral: Secondary | ICD-10-CM | POA: Diagnosis not present

## 2017-10-23 DIAGNOSIS — Z961 Presence of intraocular lens: Secondary | ICD-10-CM | POA: Diagnosis not present

## 2017-10-23 DIAGNOSIS — H5340 Unspecified visual field defects: Secondary | ICD-10-CM | POA: Diagnosis not present

## 2017-10-23 DIAGNOSIS — H18413 Arcus senilis, bilateral: Secondary | ICD-10-CM | POA: Diagnosis not present

## 2017-10-23 DIAGNOSIS — Z9842 Cataract extraction status, left eye: Secondary | ICD-10-CM | POA: Diagnosis not present

## 2017-10-23 DIAGNOSIS — E119 Type 2 diabetes mellitus without complications: Secondary | ICD-10-CM | POA: Diagnosis not present

## 2017-10-23 DIAGNOSIS — H40023 Open angle with borderline findings, high risk, bilateral: Secondary | ICD-10-CM | POA: Diagnosis not present

## 2017-10-23 DIAGNOSIS — H26491 Other secondary cataract, right eye: Secondary | ICD-10-CM | POA: Diagnosis not present

## 2017-11-28 DIAGNOSIS — E1165 Type 2 diabetes mellitus with hyperglycemia: Secondary | ICD-10-CM | POA: Diagnosis not present

## 2017-12-06 DIAGNOSIS — W208XXA Other cause of strike by thrown, projected or falling object, initial encounter: Secondary | ICD-10-CM | POA: Diagnosis not present

## 2017-12-06 DIAGNOSIS — R51 Headache: Secondary | ICD-10-CM | POA: Diagnosis not present

## 2018-01-16 ENCOUNTER — Encounter: Payer: Self-pay | Admitting: Nurse Practitioner

## 2018-01-16 ENCOUNTER — Ambulatory Visit (INDEPENDENT_AMBULATORY_CARE_PROVIDER_SITE_OTHER): Payer: Medicare HMO | Admitting: Nurse Practitioner

## 2018-01-16 VITALS — BP 132/78 | HR 81 | Temp 98.4°F | Ht 60.5 in | Wt 209.2 lb

## 2018-01-16 DIAGNOSIS — Z23 Encounter for immunization: Secondary | ICD-10-CM

## 2018-01-16 DIAGNOSIS — R05 Cough: Secondary | ICD-10-CM | POA: Diagnosis not present

## 2018-01-16 DIAGNOSIS — E119 Type 2 diabetes mellitus without complications: Secondary | ICD-10-CM | POA: Diagnosis not present

## 2018-01-16 DIAGNOSIS — R059 Cough, unspecified: Secondary | ICD-10-CM

## 2018-01-16 LAB — HEMOGLOBIN A1C
Est. average glucose Bld gHb Est-mCnc: 189 mg/dL
Hgb A1c MFr Bld: 8.2 % — ABNORMAL HIGH (ref 4.8–5.6)

## 2018-01-16 LAB — BMP8+EGFR
BUN/Creatinine Ratio: 23 (ref 12–28)
BUN: 18 mg/dL (ref 8–27)
CO2: 28 mmol/L (ref 20–29)
Calcium: 9.8 mg/dL (ref 8.7–10.3)
Chloride: 100 mmol/L (ref 96–106)
Creatinine, Ser: 0.8 mg/dL (ref 0.57–1.00)
GFR calc Af Amer: 86 mL/min/{1.73_m2} (ref >59)
GFR calc non Af Amer: 74 mL/min/{1.73_m2} (ref >59)
Glucose: 254 mg/dL — ABNORMAL HIGH (ref 65–99)
Potassium: 4.3 mmol/L (ref 3.5–5.2)
Sodium: 142 mmol/L (ref 134–144)

## 2018-01-16 MED ORDER — BENZONATATE 100 MG PO CAPS: 100.0000 mg | ORAL_CAPSULE | Freq: Four times a day (QID) | ORAL | 1 refills | Status: DC | PRN

## 2018-01-16 NOTE — Patient Instructions (Addendum)
- Get HEPA filter mask to wear when exposed to fumes/fragrances. - Take Tessalon perles as needed for cough - continue using albuterol inhaler as needed for persistent coughing.   - May take Tylenol today after having flu shot. Influenza Virus Vaccine injection What is this medicine? INFLUENZA VIRUS VACCINE (in floo EN zuh VAHY ruhs vak SEEN) helps to reduce the risk of getting influenza also known as the flu. The vaccine only helps protect you against some strains of the flu. This medicine may be used for other purposes; ask your health care provider or pharmacist if you have questions. COMMON BRAND NAME(S): Afluria, Agriflu, Alfuria, FLUAD, Fluarix, Fluarix Quadrivalent, Flublok, Flublok Quadrivalent, FLUCELVAX, Flulaval, Fluvirin, Fluzone, Fluzone High-Dose, Fluzone Intradermal What should I tell my health care provider before I take this medicine? They need to know if you have any of these conditions: -bleeding disorder like hemophilia -fever or infection -Guillain-Barre syndrome or other neurological problems -immune system problems -infection with the human immunodeficiency virus (HIV) or AIDS -low blood platelet counts -multiple sclerosis -an unusual or allergic reaction to influenza virus vaccine, latex, other medicines, foods, dyes, or preservatives. Different brands of vaccines contain different allergens. Some may contain latex or eggs. Talk to your doctor about your allergies to make sure that you get the right vaccine. -pregnant or trying to get pregnant -breast-feeding How should I use this medicine? This vaccine is for injection into a muscle or under the skin. It is given by a health care professional. A copy of Vaccine Information Statements will be given before each vaccination. Read this sheet carefully each time. The sheet may change frequently. Talk to your healthcare provider to see which vaccines are right for you. Some vaccines should not be used in all age  groups. Overdosage: If you think you have taken too much of this medicine contact a poison control center or emergency room at once. NOTE: This medicine is only for you. Do not share this medicine with others. What if I miss a dose? This does not apply. What may interact with this medicine? -chemotherapy or radiation therapy -medicines that lower your immune system like etanercept, anakinra, infliximab, and adalimumab -medicines that treat or prevent blood clots like warfarin -phenytoin -steroid medicines like prednisone or cortisone -theophylline -vaccines This list may not describe all possible interactions. Give your health care provider a list of all the medicines, herbs, non-prescription drugs, or dietary supplements you use. Also tell them if you smoke, drink alcohol, or use illegal drugs. Some items may interact with your medicine. What should I watch for while using this medicine? Report any side effects that do not go away within 3 days to your doctor or health care professional. Call your health care provider if any unusual symptoms occur within 6 weeks of receiving this vaccine. You may still catch the flu, but the illness is not usually as bad. You cannot get the flu from the vaccine. The vaccine will not protect against colds or other illnesses that may cause fever. The vaccine is needed every year. What side effects may I notice from receiving this medicine? Side effects that you should report to your doctor or health care professional as soon as possible: -allergic reactions like skin rash, itching or hives, swelling of the face, lips, or tongue Side effects that usually do not require medical attention (report to your doctor or health care professional if they continue or are bothersome): -fever -headache -muscle aches and pains -pain, tenderness, redness, or swelling at  the injection site -tiredness This list may not describe all possible side effects. Call your doctor for  medical advice about side effects. You may report side effects to FDA at 1-800-FDA-1088. Where should I keep my medicine? The vaccine will be given by a health care professional in a clinic, pharmacy, doctor's office, or other health care setting. You will not be given vaccine doses to store at home. NOTE: This sheet is a summary. It may not cover all possible information. If you have questions about this medicine, talk to your doctor, pharmacist, or health care provider.  2018 Elsevier/Gold Standard (2014-10-23 10:07:28)

## 2018-01-16 NOTE — Progress Notes (Addendum)
Subjective:    Patient ID: Joanna Sandoval, female    DOB: April 29, 1945, 72 y.o.   MRN: [id number]  Cough  This is a new problem. The current episode started in the past 7 days. The problem has been gradually worsening. The cough is productive of sputum. Pertinent negatives include no chills, fever, headaches or shortness of breath. The symptoms are aggravated by fumes and other. 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 Treatments tried: Albuterol inhaler. The treatment provided mild relief. Her past medical history is significant for environmental allergies. There is no history of asthma.  Diabetes  She presents for her follow-up diabetic visit. She has type 2 diabetes mellitus. Her disease course has been stable. Pertinent negatives for hypoglycemia include no confusion, dizziness or headaches. Pertinent negatives for diabetes include no fatigue, no polyphagia and no polyuria. Pertinent negatives for diabetic complications include no nephropathy or peripheral neuropathy. Risk factors for coronary artery disease include sedentary lifestyle, obesity and diabetes mellitus. Current diabetic treatment includes oral agent (dual therapy). She is compliant with treatment most of the time. She is following a diabetic diet. She rarely participates in exercise. Her breakfast blood glucose is taken between 7-8 am. Her breakfast blood glucose range is generally 140-180 mg/dl. She does not see a podiatrist.Eye exam is not current (Due in January 2020).    Past Medical History:  Diagnosis Date  . Arthritis   . Diabetes mellitus   . Hypercholesteremia   . Hypertension   . Post-menopausal bleeding    Past Surgical History:  Procedure Laterality Date  . CATARACT EXTRACTION    . DILATION AND CURETTAGE OF UTERUS    . HYSTEROSCOPY W/D&C N/A 05/02/2017   Procedure: DILATATION AND CURETTAGE /HYSTEROSCOPY;   Surgeon: Mora Bellman, MD;  Location: [facility];  Service: Gynecology;  Laterality: N/A;  . KNEE ARTHROSCOPY      Current Outpatient Medications:  .  atenolol (TENORMIN) 100 MG tablet, Take 50 mg by mouth daily. , Disp: , Rfl:  .  atorvastatin (LIPITOR) 40 MG tablet, Take 40 mg by mouth daily. Take 1/2 pill at bedtime, Disp: , Rfl:  .  D3-50 50000 units capsule, , Disp: , Rfl: 0 .  glimepiride (AMARYL) 2 MG tablet, Take 2 mg by mouth daily before breakfast., Disp: , Rfl:  .  losartan (COZAAR) 50 MG tablet, Take 50 mg by mouth daily., Disp: , Rfl:  .  oxyCODONE-acetaminophen (PERCOCET/ROXICET) 5-325 MG tablet, Take 1-2 tablets by mouth every 6 (six) hours as needed., Disp: 10 tablet, Rfl: 0 .  sitaGLIPtin ([name]) 50 MG tablet, Take 50 mg by mouth daily., Disp: , Rfl:   Review of Systems  Constitutional: Negative for chills, fatigue and fever.  HENT: Negative for congestion.   Respiratory: Positive for cough. Negative for shortness of breath.   Gastrointestinal: Negative for abdominal pain and nausea.  Endocrine: Negative for polyphagia and polyuria.  Genitourinary: Negative for frequency.  Allergic/Immunologic: Positive for environmental allergies.  Neurological: Negative for dizziness and headaches.  Psychiatric/Behavioral: Negative for confusion.       Vitals:   01/16/18 0922  BP: 132/78  Pulse: 81  Temp: 98.4 F (36.9 C)  SpO2: 96%   Vitals:   01/16/18 0922  Weight: 209 lb 3.2 oz (94.9 kg)  Height: 5' 0.5" (1.537 m)    Objective:   Physical Exam  Constitutional: She appears well-developed and well-nourished.  HENT:  Head: Normocephalic.  Eyes: Pupils are equal, round, and reactive to light.  Neck: Normal range of motion. Neck supple.  Cardiovascular: Normal rate, regular rhythm, normal heart sounds and intact distal pulses.  Pulmonary/Chest: Effort normal and breath sounds normal. No respiratory distress. She has no wheezes. She has no rales.    Skin: Skin is warm.  Psychiatric: She has a normal mood and affect.          Assessment & Plan:  1. Cough     - Likely related to increased exposure to fumes     - Will prescribe Tessalon perles as needed      - Recommend to use a Hepa mask to help with fumes             (cleaning supplies/fragrances).  2. Type 2 diabetes mellitus without complication, without long-term current use of insulin (HCC)     - Continue with current medications     - Will check HgbA1c.      - Hemoglobin A1c     - BMP8+eGFR  3. Influenza vaccine needed      - May take Tylenol after having Influenza vaccine      - Flu vaccine HIGH DOSE PF (Fluzone High dose)

## 2018-01-18 ENCOUNTER — Other Ambulatory Visit: Payer: Self-pay | Admitting: Nurse Practitioner

## 2018-01-18 DIAGNOSIS — E119 Type 2 diabetes mellitus without complications: Secondary | ICD-10-CM

## 2018-01-18 MED ORDER — SITAGLIPTIN PHOSPHATE 100 MG PO TABS: 100.0000 mg | ORAL_TABLET | Freq: Every day | ORAL | 1 refills | Status: DC

## 2018-02-04 DIAGNOSIS — K635 Polyp of colon: Secondary | ICD-10-CM | POA: Diagnosis not present

## 2018-02-04 DIAGNOSIS — R195 Other fecal abnormalities: Secondary | ICD-10-CM | POA: Diagnosis not present

## 2018-02-04 DIAGNOSIS — D120 Benign neoplasm of cecum: Secondary | ICD-10-CM | POA: Diagnosis not present

## 2018-02-04 DIAGNOSIS — D122 Benign neoplasm of ascending colon: Secondary | ICD-10-CM | POA: Diagnosis not present

## 2018-02-04 DIAGNOSIS — Z1211 Encounter for screening for malignant neoplasm of colon: Secondary | ICD-10-CM | POA: Diagnosis not present

## 2018-02-04 DIAGNOSIS — K6389 Other specified diseases of intestine: Secondary | ICD-10-CM | POA: Diagnosis not present

## 2018-02-04 LAB — HM COLONOSCOPY

## 2018-02-19 ENCOUNTER — Other Ambulatory Visit: Payer: Self-pay | Admitting: Nurse Practitioner

## 2018-03-02 DIAGNOSIS — E1165 Type 2 diabetes mellitus with hyperglycemia: Secondary | ICD-10-CM | POA: Diagnosis not present

## 2018-03-20 DIAGNOSIS — E119 Type 2 diabetes mellitus without complications: Secondary | ICD-10-CM | POA: Diagnosis not present

## 2018-03-20 DIAGNOSIS — E785 Hyperlipidemia, unspecified: Secondary | ICD-10-CM | POA: Diagnosis not present

## 2018-03-20 DIAGNOSIS — E559 Vitamin D deficiency, unspecified: Secondary | ICD-10-CM | POA: Diagnosis not present

## 2018-03-20 DIAGNOSIS — E669 Obesity, unspecified: Secondary | ICD-10-CM | POA: Diagnosis not present

## 2018-03-20 DIAGNOSIS — M1990 Unspecified osteoarthritis, unspecified site: Secondary | ICD-10-CM | POA: Diagnosis not present

## 2018-03-20 DIAGNOSIS — I10 Essential (primary) hypertension: Secondary | ICD-10-CM | POA: Diagnosis not present

## 2018-04-22 ENCOUNTER — Other Ambulatory Visit: Payer: Self-pay | Admitting: Nurse Practitioner

## 2018-04-22 DIAGNOSIS — Z1231 Encounter for screening mammogram for malignant neoplasm of breast: Secondary | ICD-10-CM

## 2018-04-24 ENCOUNTER — Ambulatory Visit: Payer: Medicare HMO

## 2018-04-26 ENCOUNTER — Other Ambulatory Visit: Payer: Self-pay | Admitting: Nurse Practitioner

## 2018-04-26 DIAGNOSIS — E119 Type 2 diabetes mellitus without complications: Secondary | ICD-10-CM

## 2018-04-30 ENCOUNTER — Ambulatory Visit (INDEPENDENT_AMBULATORY_CARE_PROVIDER_SITE_OTHER): Payer: PPO | Admitting: Internal Medicine

## 2018-04-30 ENCOUNTER — Ambulatory Visit: Payer: Medicare HMO | Admitting: Internal Medicine

## 2018-04-30 ENCOUNTER — Encounter: Payer: Self-pay | Admitting: Internal Medicine

## 2018-04-30 VITALS — BP 122/80 | HR 74 | Temp 97.9°F | Ht 61.0 in | Wt 207.6 lb

## 2018-04-30 DIAGNOSIS — I10 Essential (primary) hypertension: Secondary | ICD-10-CM | POA: Diagnosis not present

## 2018-04-30 DIAGNOSIS — E1165 Type 2 diabetes mellitus with hyperglycemia: Secondary | ICD-10-CM | POA: Insufficient documentation

## 2018-04-30 NOTE — Progress Notes (Signed)
Subjective:     Patient ID: Joanna Sandoval , female    DOB: 22-Jan-1946 , 73 y.o.   MRN: [id number]   Chief Complaint  Patient presents with  . Diabetes    HPI  Pt is here for DM FU. [name] increased her [name] from 50 mg bid to 100 mg bid three months ago. At that time her HGBA1C was 8.2. Her average glucose fasting has been 140. She has not brought in her glucometer or glucose diaries. Denies vaginal itching or urinary symptoms. She admits her weakness is sweets and carbs. Has seen a dietician years ago.   Past Medical History:  Diagnosis Date  . Arthritis   . Diabetes mellitus   . Hypercholesteremia   . Hypertension   . Post-menopausal bleeding      Family History  Problem Relation Age of Onset  . Hypertension Mother   . Hypertension Father   . Cancer Father        ?lung  . Diabetes Sister      Current Outpatient Medications:  .  albuterol (PROVENTIL HFA;VENTOLIN HFA) 108 (90 Base) MCG/ACT inhaler, Inhale 2 puffs into the lungs every 6 (six) hours as needed., Disp: , Rfl:  .  atenolol (TENORMIN) 100 MG tablet, Take 50 mg by mouth daily. , Disp: , Rfl:  .  atorvastatin (LIPITOR) 40 MG tablet, Take 40 mg by mouth daily. Take 1/2 pill at bedtime, Disp: , Rfl:  .  benzonatate (TESSALON PERLES) 100 MG capsule, Take 1 capsule (100 mg total) by mouth every 6 (six) hours as needed for cough., Disp: 30 capsule, Rfl: 1 .  D3-50 50000 units capsule, , Disp: , Rfl: 0 .  glimepiride (AMARYL) 2 MG tablet, TAKE 2 TABLETS BY MOUTH ONCE DAILY, Disp: 180 tablet, Rfl: 0 .  [name] 100 MG tablet, TAKE 1 TABLET(100 MG) BY MOUTH DAILY, Disp: 90 tablet, Rfl: 1 .  losartan (COZAAR) 50 MG tablet, Take 50 mg by mouth daily., Disp: , Rfl:    No Known Allergies   Review of Systems  Constitutional: Positive for diaphoresis. Negative for chills and fever.       Sweats a lot day and night time, and soaks up the sheets. She has been menopausal symptoms year ago. She has not checked her glucose  during those symptoms.   Eyes: Negative for visual disturbance.       Is working on getting her eye xm this month  Respiratory: Negative for cough, chest tightness and shortness of breath.   Cardiovascular: Negative for chest pain, palpitations and leg swelling.  Gastrointestinal: Negative for abdominal pain, constipation and diarrhea.  Genitourinary: Negative for difficulty urinating and dysuria.  Musculoskeletal: Negative for gait problem.  Skin: Negative for rash and wound.  Neurological: Negative for tremors, weakness and numbness.    Today's Vitals   04/30/18 0835  BP: 122/80  Pulse: 74  Temp: 97.9 F (36.6 C)  TempSrc: Oral  SpO2: 94%  Weight: 207 lb 9.6 oz (94.2 kg)  Height: 5\' 1"  (1.549 m)  PainSc: 0-No pain   Body mass index is 39.23 kg/m.   Objective:  Physical Exam   Constitutional: She is oriented to person, place, and time. She appears well-developed and well-nourished. No distress.  HENT:  Head: Normocephalic and atraumatic.  Right Ear: External ear normal.  Left Ear: External ear normal.  Nose: Nose normal.  Eyes: Conjunctivae are normal. Right eye exhibits no discharge. Left eye exhibits no discharge. No scleral icterus.  Neck: Neck  supple. No thyromegaly present.  No carotid bruits bilaterally  Cardiovascular: Normal rate and regular rhythm.  No murmur heard. Pulmonary/Chest: Effort normal and breath sounds normal. No respiratory distress.  Musculoskeletal: Normal range of motion. She exhibits no edema.  Lymphadenopathy:    She has no cervical adenopathy.  Neurological: She is alert and oriented to person, place, and time.  Skin: Skin is warm and dry. Capillary refill takes less than 2 seconds. No rash noted. She is not diaphoretic.  Psychiatric: She has a normal mood and affect. Her behavior is normal. Judgment and thought content normal.  Nursing note reviewed.  Assessment And Plan:  1. Uncontrolled type 2 diabetes mellitus with hyperglycemia  ([place])- chronic. May continue same meds for now unless labs are abnormal. She declined to be sent to nutritionist.  - Hemoglobin A1c - Lipid Profile  2. Essential hypertension- stable. May continue same meds.  - CMP14 + Anion Gap - CBC no Diff - Lipid Profile FU with [name] in 3 months DM  She would like her labs sent to her cardiologist Al Decant.  Curtez Brallier RODRIGUEZ-SOUTHWORTH, PA-C

## 2018-04-30 NOTE — Patient Instructions (Addendum)
Get STEVIA sweetener to use as sweater which is healthier equal, splenda, or nutrasweets which are related to cancer and Stevia.   You can look up receptive for healthy desserts on [employer] for healthier sweet recipes.   PLEASE BRING YOUR GLUCOMETER NEXT VISIT AND DIARY OF YOUR GLUCOSE WHEN YOU HAVE SWEATING SPELLS    Diabetes Mellitus and Nutrition, Adult When you have diabetes (diabetes mellitus), it is very important to have healthy eating habits because your blood sugar (glucose) levels are greatly affected by what you eat and drink. Eating healthy foods in the appropriate amounts, at about the same times every day, can help you:  Control your blood glucose.  Lower your risk of heart disease.  Improve your blood pressure.  Reach or maintain a healthy weight. Every person with diabetes is different, and each person has different needs for a meal plan. Your health care provider may recommend that you work with a diet and nutrition specialist (dietitian) to make a meal plan that is best for you. Your meal plan may vary depending on factors such as:  The calories you need.  The medicines you take.  Your weight.  Your blood glucose, blood pressure, and cholesterol levels.  Your activity level.  Other health conditions you have, such as heart or kidney disease. How do carbohydrates affect me? Carbohydrates, also called carbs, affect your blood glucose level more than any other type of food. Eating carbs naturally raises the amount of glucose in your blood. Carb counting is a method for keeping track of how many carbs you eat. Counting carbs is important to keep your blood glucose at a healthy level, especially if you use insulin or take certain oral diabetes medicines. It is important to know how many carbs you can safely have in each meal. This is different for every person. Your dietitian can help you calculate how many carbs you should have at each meal and for each snack. Foods  that contain carbs include:  Bread, cereal, rice, pasta, and crackers.  Potatoes and corn.  Peas, beans, and lentils.  Milk and yogurt.  Fruit and juice.  Desserts, such as cakes, cookies, ice cream, and candy. How does alcohol affect me? Alcohol can cause a sudden decrease in blood glucose (hypoglycemia), especially if you use insulin or take certain oral diabetes medicines. Hypoglycemia can be a life-threatening condition. Symptoms of hypoglycemia (sleepiness, dizziness, and confusion) are similar to symptoms of having too much alcohol. If your health care provider says that alcohol is safe for you, follow these guidelines:  Limit alcohol intake to no more than 1 drink per day for nonpregnant women and 2 drinks per day for men. One drink equals 12 oz of beer, 5 oz of wine, or 1 oz of hard liquor.  Do not drink on an empty stomach.  Keep yourself hydrated with water, diet soda, or unsweetened iced tea.  Keep in mind that regular soda, juice, and other mixers may contain a lot of sugar and must be counted as carbs. What are tips for following this plan?  Reading food labels  Start by checking the serving size on the "Nutrition Facts" label of packaged foods and drinks. The amount of calories, carbs, fats, and other nutrients listed on the label is based on one serving of the item. Many items contain more than one serving per package.  Check the total grams (g) of carbs in one serving. You can calculate the number of servings of carbs in one serving  by dividing the total carbs by 15. For example, if a food has 30 g of total carbs, it would be equal to 2 servings of carbs.  Check the number of grams (g) of saturated and trans fats in one serving. Choose foods that have low or no amount of these fats.  Check the number of milligrams (mg) of salt (sodium) in one serving. Most people should limit total sodium intake to less than 2,300 mg per day.  Always check the nutrition  information of foods labeled as "low-fat" or "nonfat". These foods may be higher in added sugar or refined carbs and should be avoided.  Talk to your dietitian to identify your daily goals for nutrients listed on the label. Shopping  Avoid buying canned, premade, or processed foods. These foods tend to be high in fat, sodium, and added sugar.  Shop around the outside edge of the grocery store. This includes fresh fruits and vegetables, bulk grains, fresh meats, and fresh dairy. Cooking  Use low-heat cooking methods, such as baking, instead of high-heat cooking methods like deep frying.  Cook using healthy oils, such as olive, canola, or sunflower oil.  Avoid cooking with butter, cream, or high-fat meats. Meal planning  Eat meals and snacks regularly, preferably at the same times every day. Avoid going long periods of time without eating.  Eat foods high in fiber, such as fresh fruits, vegetables, beans, and whole grains. Talk to your dietitian about how many servings of carbs you can eat at each meal.  Eat 4-6 ounces (oz) of lean protein each day, such as lean meat, chicken, fish, eggs, or tofu. One oz of lean protein is equal to: ? 1 oz of meat, chicken, or fish. ? 1 egg. ?  cup of tofu.  Eat some foods each day that contain healthy fats, such as avocado, nuts, seeds, and fish. Lifestyle  Check your blood glucose regularly.  Exercise regularly as told by your health care provider. This may include: ? 150 minutes of moderate-intensity or vigorous-intensity exercise each week. This could be brisk walking, biking, or water aerobics. ? Stretching and doing strength exercises, such as yoga or weightlifting, at least 2 times a week.  Take medicines as told by your health care provider.  Do not use any products that contain nicotine or tobacco, such as cigarettes and e-cigarettes. If you need help quitting, ask your health care provider.  Work with a Social worker or diabetes educator  to identify strategies to manage stress and any emotional and social challenges. Questions to ask a health care provider  Do I need to meet with a diabetes educator?  Do I need to meet with a dietitian?  What number can I call if I have questions?  When are the best times to check my blood glucose? Where to find more information:  American Diabetes Association: diabetes.org  Academy of Nutrition and Dietetics: www.eatright.[employer] of Diabetes and Digestive and Kidney Diseases (NIH): [employer] Summary  A healthy meal plan will help you control your blood glucose and maintain a healthy lifestyle.  Working with a diet and nutrition specialist (dietitian) can help you make a meal plan that is best for you.  Keep in mind that carbohydrates (carbs) and alcohol have immediate effects on your blood glucose levels. It is important to count carbs and to use alcohol carefully. This information is not intended to replace advice given to you by your health care provider. Make sure you discuss any questions you  have with your health care provider. Document Released: 12/29/2004 Document Revised: 11/01/2016 Document Reviewed: 05/08/2016 Elsevier Interactive Patient Education  2019 [employer].

## 2018-05-01 LAB — CMP14 + ANION GAP
ALT: 8 IU/L (ref 0–32)
AST: 11 IU/L (ref 0–40)
Albumin/Globulin Ratio: 1.5 (ref 1.2–2.2)
Albumin: 4.1 g/dL (ref 3.5–4.8)
Alkaline Phosphatase: 121 IU/L — ABNORMAL HIGH (ref 39–117)
Anion Gap: 16 mmol/L (ref 10.0–18.0)
BUN/Creatinine Ratio: 17 (ref 12–28)
BUN: 14 mg/dL (ref 8–27)
Bilirubin Total: 0.5 mg/dL (ref 0.0–1.2)
CO2: 25 mmol/L (ref 20–29)
Calcium: 9.3 mg/dL (ref 8.7–10.3)
Chloride: 103 mmol/L (ref 96–106)
Creatinine, Ser: 0.81 mg/dL (ref 0.57–1.00)
GFR calc Af Amer: 85 mL/min/{1.73_m2} (ref >59)
GFR calc non Af Amer: 73 mL/min/{1.73_m2} (ref >59)
Globulin, Total: 2.8 g/dL (ref 1.5–4.5)
Glucose: 199 mg/dL — ABNORMAL HIGH (ref 65–99)
Potassium: 4.4 mmol/L (ref 3.5–5.2)
Sodium: 144 mmol/L (ref 134–144)
Total Protein: 6.9 g/dL (ref 6.0–8.5)

## 2018-05-01 LAB — CBC
Hematocrit: 35.9 % (ref 34.0–46.6)
Hemoglobin: 11.9 g/dL (ref 11.1–15.9)
MCH: 28.7 pg (ref 26.6–33.0)
MCHC: 33.1 g/dL (ref 31.5–35.7)
MCV: 87 fL (ref 79–97)
Platelets: 238 10*3/uL (ref 150–450)
RBC: 4.14 x10E6/uL (ref 3.77–5.28)
RDW: 16.8 % — ABNORMAL HIGH (ref 11.7–15.4)
WBC: 10.6 10*3/uL (ref 3.4–10.8)

## 2018-05-01 LAB — LIPID PANEL
Chol/HDL Ratio: 3.4 ratio (ref 0.0–4.4)
Cholesterol, Total: 168 mg/dL (ref 100–199)
HDL: 49 mg/dL (ref >39)
LDL Calculated: 86 mg/dL (ref 0–99)
Triglycerides: 166 mg/dL — ABNORMAL HIGH (ref 0–149)
VLDL Cholesterol Cal: 33 mg/dL (ref 5–40)

## 2018-05-01 LAB — HEMOGLOBIN A1C
Est. average glucose Bld gHb Est-mCnc: 171 mg/dL
Hgb A1c MFr Bld: 7.6 % — ABNORMAL HIGH (ref 4.8–5.6)

## 2018-05-10 ENCOUNTER — Other Ambulatory Visit: Payer: Self-pay

## 2018-05-10 MED ORDER — ATORVASTATIN CALCIUM 40 MG PO TABS: 40.0000 mg | ORAL_TABLET | Freq: Every day | ORAL | 1 refills | Status: DC

## 2018-05-15 ENCOUNTER — Ambulatory Visit
Admission: RE | Admit: 2018-05-15 | Discharge: 2018-05-15 | Disposition: A | Discharge status: Home / Self Care | Payer: PPO | Source: Ambulatory Visit | Attending: Nurse Practitioner | Admitting: Nurse Practitioner

## 2018-05-15 DIAGNOSIS — Z1231 Encounter for screening mammogram for malignant neoplasm of breast: Secondary | ICD-10-CM

## 2018-05-16 ENCOUNTER — Ambulatory Visit: Payer: PPO

## 2018-05-23 ENCOUNTER — Other Ambulatory Visit: Payer: Self-pay | Admitting: Nurse Practitioner

## 2018-05-30 ENCOUNTER — Telehealth: Payer: Self-pay | Admitting: Internal Medicine

## 2018-05-30 NOTE — Telephone Encounter (Signed)
Called to REschedule Medicare Annual Wellness Visit with the Nurse Health Advisor, due to NO SHOW 05/16/2018 at 3:15 PM.  Patient states she is at work now and will call back to schedule.  I gave her my Name, Lattie Haw, and asked her to call me at [phone].  Patient voiced understood.  If patient returns call, please schedule AWV- Initial  with [facility].  Patient is due NOW>  Thank you! For any questions please contact: Janace Hoard at [phone] or Skype [email or web address]

## 2018-05-31 NOTE — Telephone Encounter (Signed)
Spoke with patient and scheduled Medicare Annual Wellness Visit for 06/19/2018 at 8:30 AM.  Janace Hoard, Care Guide.

## 2018-06-19 ENCOUNTER — Ambulatory Visit (INDEPENDENT_AMBULATORY_CARE_PROVIDER_SITE_OTHER): Payer: PPO

## 2018-06-19 VITALS — BP 140/68 | HR 77 | Temp 98.4°F | Ht 61.2 in | Wt 207.4 lb

## 2018-06-19 DIAGNOSIS — E1165 Type 2 diabetes mellitus with hyperglycemia: Secondary | ICD-10-CM

## 2018-06-19 DIAGNOSIS — Z0000 Encounter for general adult medical examination without abnormal findings: Secondary | ICD-10-CM

## 2018-06-19 DIAGNOSIS — Z23 Encounter for immunization: Secondary | ICD-10-CM

## 2018-06-19 LAB — POCT URINALYSIS DIPSTICK
Bilirubin, UA: NEGATIVE
Blood, UA: NEGATIVE
Glucose, UA: NEGATIVE
Ketones, UA: NEGATIVE
Leukocytes, UA: NEGATIVE
Nitrite, UA: NEGATIVE
Protein, UA: NEGATIVE
Spec Grav, UA: 1.025 (ref 1.010–1.025)
Urobilinogen, UA: 0.2 E.U./dL
pH, UA: 6 (ref 5.0–8.0)

## 2018-06-19 LAB — POCT UA - MICROALBUMIN
Albumin/Creatinine Ratio, Urine, POC: <30
Creatinine, POC: 300 mg/dL
Microalbumin Ur, POC: 10 mg/L

## 2018-06-19 MED ORDER — PNEUMOCOCCAL 13-VAL CONJ VACC IM SUSP: 0.5000 mL | INTRAMUSCULAR | 0 refills | Status: AC

## 2018-06-19 NOTE — Addendum Note (Signed)
Addended by: [name] on: 06/19/2018 03:47 PM   Modules accepted: Orders

## 2018-06-19 NOTE — Addendum Note (Signed)
Addended by: Glenna Durand E on: 06/19/2018 01:56 PM   Modules accepted: Orders

## 2018-06-19 NOTE — Progress Notes (Signed)
Subjective:   Joanna Sandoval is a 73 y.o. female who presents for Medicare Annual (Subsequent) preventive examination.  Review of Systems:  n/a Cardiac Risk Factors include: advanced age (>36men, >26 women);diabetes mellitus;dyslipidemia;hypertension;obesity (BMI >30kg/m2);sedentary lifestyle     Objective:     Vitals: BP 140/68 (BP Location: Left Arm, Patient Position: Sitting)   Pulse 77   Temp 98.4 F (36.9 C) (Oral)   Ht 5' 1.2" (1.554 m)   Wt 207 lb 6.4 oz (94.1 kg)   SpO2 94%   BMI 38.93 kg/m   Body mass index is 38.93 kg/m.  Advanced Directives 06/19/2018 05/02/2017  Does Patient Have a Medical Advance Directive? No No  Would patient like information on creating a medical advance directive? Yes (MAU/Ambulatory/Procedural Areas - Information given) Yes (MAU/Ambulatory/Procedural Areas - Information given)    Tobacco Social History   Tobacco Use  Smoking Status Former Smoker  . Types: Cigarettes  Smokeless Tobacco Never Used     Counseling given: Not Answered   Clinical Intake:  Pre-visit preparation completed: Yes  Pain : No/denies pain Pain Score: 0-No pain     Nutritional Status: BMI > 30  Obese Nutritional Risks: None Diabetes: Yes CBG done?: No Did pt. bring in CBG monitor from home?: No  How often do you need to have someone help you when you read instructions, pamphlets, or other written materials from your doctor or pharmacy?: 1 - Never What is the last grade level you completed in school?: 11th grade  Interpreter Needed?: No  Information entered by :: NAllen LPN  Past Medical History:  Diagnosis Date  . Arthritis   . Diabetes mellitus   . Hypercholesteremia   . Hypertension   . Post-menopausal bleeding    Past Surgical History:  Procedure Laterality Date  . CATARACT EXTRACTION    . DILATION AND CURETTAGE OF UTERUS    . HYSTEROSCOPY W/D&C N/A 05/02/2017   Procedure: DILATATION AND CURETTAGE /HYSTEROSCOPY;  Surgeon: Mora Bellman, MD;  Location: [facility];  Service: Gynecology;  Laterality: N/A;  . KNEE ARTHROSCOPY     Family History  Problem Relation Age of Onset  . Hypertension Mother   . Hypertension Father   . Cancer Father        ?lung  . Diabetes Sister    Social History   Socioeconomic History  . Marital status: Divorced    Spouse name: Not on file  . Number of children: Not on file  . Years of education: Not on file  . Highest education level: Not on file  Occupational History  . Not on file  Social Needs  . Financial resource strain: Very hard  . Food insecurity:    Worry: Often true    Inability: Often true  . Transportation needs:    Medical: No    Non-medical: No  Tobacco Use  . Smoking status: Former Smoker    Types: Cigarettes  . Smokeless tobacco: Never Used  Substance and Sexual Activity  . Alcohol use: No  . Drug use: No  . Sexual activity: Not Currently    Birth control/protection: Post-menopausal  Lifestyle  . Physical activity:    Days per week: 0 days    Minutes per session: 0 min  . Stress: Very much  Relationships  . Social connections:    Talks on phone: Not on file    Gets together: Not on file    Attends religious service: Not on file    Active member  of club or organization: Not on file    Attends meetings of clubs or organizations: Not on file    Relationship status: Not on file  Other Topics Concern  . Not on file  Social History Narrative  . Not on file    Outpatient Encounter Medications as of 06/19/2018  Medication Sig  . albuterol (PROVENTIL HFA;VENTOLIN HFA) 108 (90 Base) MCG/ACT inhaler Inhale 2 puffs into the lungs every 6 (six) hours as needed.  [name] atenolol (TENORMIN) 100 MG tablet Take 50 mg by mouth daily.   [name] atorvastatin (LIPITOR) 40 MG tablet Take 1 tablet (40 mg total) by mouth daily. Take 1/2 pill at bedtime  . benzonatate (TESSALON PERLES) 100 MG capsule Take 1 capsule (100 mg total) by mouth every 6 (six) hours as  needed for cough.  . D3-50 50000 units capsule   . glimepiride (AMARYL) 2 MG tablet TAKE 2 TABLETS BY MOUTH ONCE DAILY (Patient taking differently: Take 2 mg by mouth daily. Take 3 tablets daily)  . [name] 100 MG tablet TAKE 1 TABLET(100 MG) BY MOUTH DAILY  . losartan (COZAAR) 50 MG tablet Take 50 mg by mouth daily.  . pneumococcal 13-valent conjugate vaccine (PREVNAR 13) SUSP injection Inject 0.5 mLs into the muscle tomorrow at 10 am for 1 dose.   No facility-administered encounter medications on file as of 06/19/2018.     Activities of Daily Living In your present state of health, do you have any difficulty performing the following activities: 06/19/2018  Hearing? Y  Comment has trouble hearing all the time  Vision? Y  Comment has to focus real hard  Difficulty concentrating or making decisions? Y  Comment some forgetfulness  Walking or climbing stairs? Y  Comment to a point  Dressing or bathing? N  Doing errands, shopping? N  Preparing Food and eating ? N  Using the Toilet? N  In the past six months, have you accidently leaked urine? Y  Comment wears a pad when going out, pull ups for long distances  Do you have problems with loss of bowel control? N  Managing your Medications? N  Managing your Finances? N  Housekeeping or managing your Housekeeping? N  Some recent data might be hidden    Patient Care Team: [name], FNP as PCP - General (General Practice)    Assessment:   This is a routine wellness examination for [place].  Exercise Activities and Dietary recommendations Current Exercise Habits: The patient does not participate in regular exercise at present, Exercise limited by: None identified  Goals    . Exercise 150 min/wk Moderate Activity (pt-stated)     Walking regularly.  Walking further than usual to to move more. Eat more healthy and cut down on medications       Fall Risk Fall Risk  06/19/2018 04/30/2018 01/16/2018  Falls in the past year? 0 1 No    Number falls in past yr: - 0 -  Injury with Fall? - 0 -  Risk for fall due to : Medication side effect - -  Follow up Falls prevention discussed;Education provided - -   Is the patient's home free of loose throw rugs in walkways, pet beds, electrical cords, etc?   yes      Grab bars in the bathroom? yes      Handrails on the stairs?   n/a      Adequate lighting?   yes  Timed Get Up and Go performed: n/a  Depression Screen PHQ 2/9  Scores 06/19/2018 04/30/2018 01/16/2018  PHQ - 2 Score 0 0 0  PHQ- 9 Score 0 - -     Cognitive Function     6CIT Screen 06/19/2018  What Year? 0 points  What month? 0 points  What time? 3 points  Count back from 20 0 points  Months in reverse 2 points  Repeat phrase 0 points  Total Score 5    Immunization History  Administered Date(s) Administered  . Influenza, High Dose Seasonal PF 01/16/2018    Qualifies for Shingles Vaccine? yes  Screening Tests Health Maintenance  Topic Date Due  . Hepatitis C Screening  [birth year]  . FOOT EXAM  11/28/1955  . OPHTHALMOLOGY EXAM  11/28/1955  . PNA vac Low Risk Adult (1 of 2 - PCV13) 11/29/2011  . HEMOGLOBIN A1C  10/29/2018  . MAMMOGRAM  05/15/2020  . TETANUS/TDAP  02/25/2023  . COLONOSCOPY  02/05/2028  . INFLUENZA VACCINE  Completed  . DEXA SCAN  Completed    Cancer Screenings: Lung: Low Dose CT Chest recommended if Age 33-80 years, 30 pack-year currently smoking OR have quit w/in 15years. Patient does not qualify. Breast:  Up to date on Mammogram? Yes   Up to date of Bone Density/Dexa? Yes Colorectal: up to date  Additional Screenings: : Hepatitis C Screening: 07/05/2012 0.1     Plan:   Wants to eat healthier. Walk more.  Parks further away in order to walk more.  I have personally reviewed and noted the following in the patient's chart:   . Medical and social history . Use of alcohol, tobacco or illicit drugs  . Current medications and supplements . Functional ability and  status . Nutritional status . Physical activity . Advanced directives . List of other physicians . Hospitalizations, surgeries, and ER visits in previous 12 months . Vitals . Screenings to include cognitive, depression, and falls . Referrals and appointments  In addition, I have reviewed and discussed with patient certain preventive protocols, quality metrics, and best practice recommendations. A written personalized care plan for preventive services as well as general preventive health recommendations were provided to patient.     [name], LPN  6/0/6770

## 2018-06-19 NOTE — Patient Instructions (Signed)
Ms. Rud , Thank you for taking time to come for your Medicare Wellness Visit. I appreciate your ongoing commitment to your health goals. Please review the following plan we discussed and let me know if I can assist you in the future.   Screening recommendations/referrals: Colonoscopy: 01/2018 Mammogram: 04/2018 Bone Density: 07/2012 Recommended yearly ophthalmology/optometry visit for glaucoma screening and checkup Recommended yearly dental visit for hygiene and checkup  Vaccinations: Influenza vaccine: 01/2018 Pneumococcal vaccine: 12/2016 Tdap vaccine: 02/2013 Shingles vaccine: discussed    Advanced directives:Advance directive discussed with you today. I have provided a copy for you to complete at home and have notarized. Once this is complete please bring a copy in to our office so we can scan it into your chart.   Conditions/risks identified: Obesity.   Next appointment: 07/31/2018 at 8:30a   Preventive Care 65 Years and Older, Female Preventive care refers to lifestyle choices and visits with your health care provider that can promote health and wellness. What does preventive care include?  A yearly physical exam. This is also called an annual well check.  Dental exams once or twice a year.  Routine eye exams. Ask your health care provider how often you should have your eyes checked.  Personal lifestyle choices, including:  Daily care of your teeth and gums.  Regular physical activity.  Eating a healthy diet.  Avoiding tobacco and drug use.  Limiting alcohol use.  Practicing safe sex.  Taking low-dose aspirin every day.  Taking vitamin and mineral supplements as recommended by your health care provider. What happens during an annual well check? The services and screenings done by your health care provider during your annual well check will depend on your age, overall health, lifestyle risk factors, and family history of disease. Counseling  Your health  care provider may ask you questions about your:  Alcohol use.  Tobacco use.  Drug use.  Emotional well-being.  Home and relationship well-being.  Sexual activity.  Eating habits.  History of falls.  Memory and ability to understand (cognition).  Work and work Statistician.  Reproductive health. Screening  You may have the following tests or measurements:  Height, weight, and BMI.  Blood pressure.  Lipid and cholesterol levels. These may be checked every 5 years, or more frequently if you are over 40 years old.  Skin check.  Lung cancer screening. You may have this screening every year starting at age 6 if you have a 30-pack-year history of smoking and currently smoke or have quit within the past 15 years.  Fecal occult blood test (FOBT) of the stool. You may have this test every year starting at age 21.  Flexible sigmoidoscopy or colonoscopy. You may have a sigmoidoscopy every 5 years or a colonoscopy every 10 years starting at age 24.  Hepatitis C blood test.  Hepatitis B blood test.  Sexually transmitted disease (STD) testing.  Diabetes screening. This is done by checking your blood sugar (glucose) after you have not eaten for a while (fasting). You may have this done every 1-3 years.  Bone density scan. This is done to screen for osteoporosis. You may have this done starting at age 14.  Mammogram. This may be done every 1-2 years. Talk to your health care provider about how often you should have regular mammograms. Talk with your health care provider about your test results, treatment options, and if necessary, the need for more tests. Vaccines  Your health care provider may recommend certain vaccines, such as:  Influenza vaccine. This is recommended every year.  Tetanus, diphtheria, and acellular pertussis (Tdap, Td) vaccine. You may need a Td booster every 10 years.  Zoster vaccine. You may need this after age 19.  Pneumococcal 13-valent conjugate  (PCV13) vaccine. One dose is recommended after age 33.  Pneumococcal polysaccharide (PPSV23) vaccine. One dose is recommended after age 84. Talk to your health care provider about which screenings and vaccines you need and how often you need them. This information is not intended to replace advice given to you by your health care provider. Make sure you discuss any questions you have with your health care provider. Document Released: 04/30/2015 Document Revised: 12/22/2015 Document Reviewed: 02/02/2015 Elsevier Interactive Patient Education  2017 [employer] Prevention in the Home Falls can cause injuries. They can happen to people of all ages. There are many things you can do to make your home safe and to help prevent falls. What can I do on the outside of my home?  Regularly fix the edges of walkways and driveways and fix any cracks.  Remove anything that might make you trip as you walk through a door, such as a raised step or threshold.  Trim any bushes or trees on the path to your home.  Use bright outdoor lighting.  Clear any walking paths of anything that might make someone trip, such as rocks or tools.  Regularly check to see if handrails are loose or broken. Make sure that both sides of any steps have handrails.  Any raised decks and porches should have guardrails on the edges.  Have any leaves, snow, or ice cleared regularly.  Use sand or salt on walking paths during winter.  Clean up any spills in your garage right away. This includes oil or grease spills. What can I do in the bathroom?  Use night lights.  Install grab bars by the toilet and in the tub and shower. Do not use towel bars as grab bars.  Use non-skid mats or decals in the tub or shower.  If you need to sit down in the shower, use a plastic, non-slip stool.  Keep the floor dry. Clean up any water that spills on the floor as soon as it happens.  Remove soap buildup in the tub or shower  regularly.  Attach bath mats securely with double-sided non-slip rug tape.  Do not have throw rugs and other things on the floor that can make you trip. What can I do in the bedroom?  Use night lights.  Make sure that you have a light by your bed that is easy to reach.  Do not use any sheets or blankets that are too big for your bed. They should not hang down onto the floor.  Have a firm chair that has side arms. You can use this for support while you get dressed.  Do not have throw rugs and other things on the floor that can make you trip. What can I do in the kitchen?  Clean up any spills right away.  Avoid walking on wet floors.  Keep items that you use a lot in easy-to-reach places.  If you need to reach something above you, use a strong step stool that has a grab bar.  Keep electrical cords out of the way.  Do not use floor polish or wax that makes floors slippery. If you must use wax, use non-skid floor wax.  Do not have throw rugs and other things on the floor that  can make you trip. What can I do with my stairs?  Do not leave any items on the stairs.  Make sure that there are handrails on both sides of the stairs and use them. Fix handrails that are broken or loose. Make sure that handrails are as long as the stairways.  Check any carpeting to make sure that it is firmly attached to the stairs. Fix any carpet that is loose or worn.  Avoid having throw rugs at the top or bottom of the stairs. If you do have throw rugs, attach them to the floor with carpet tape.  Make sure that you have a light switch at the top of the stairs and the bottom of the stairs. If you do not have them, ask someone to add them for you. What else can I do to help prevent falls?  Wear shoes that:  Do not have high heels.  Have rubber bottoms.  Are comfortable and fit you well.  Are closed at the toe. Do not wear sandals.  If you use a stepladder:  Make sure that it is fully  opened. Do not climb a closed stepladder.  Make sure that both sides of the stepladder are locked into place.  Ask someone to hold it for you, if possible.  Clearly mark and make sure that you can see:  Any grab bars or handrails.  First and last steps.  Where the edge of each step is.  Use tools that help you move around (mobility aids) if they are needed. These include:  Canes.  Walkers.  Scooters.  Crutches.  Turn on the lights when you go into a dark area. Replace any light bulbs as soon as they burn out.  Set up your furniture so you have a clear path. Avoid moving your furniture around.  If any of your floors are uneven, fix them.  If there are any pets around you, be aware of where they are.  Review your medicines with your doctor. Some medicines can make you feel dizzy. This can increase your chance of falling. Ask your doctor what other things that you can do to help prevent falls. This information is not intended to replace advice given to you by your health care provider. Make sure you discuss any questions you have with your health care provider. Document Released: 01/28/2009 Document Revised: 09/09/2015 Document Reviewed: 05/08/2014 Elsevier Interactive Patient Education  2017 [employer].

## 2018-07-09 ENCOUNTER — Telehealth: Payer: Self-pay

## 2018-07-09 NOTE — Telephone Encounter (Signed)
07/09/2018 Spoke with patient about financial assistance for medications, Medicare Extra Help, Forensic psychologist for help with utilities. Patient goes to her church [employer]. Patient stated she has no other needs right now.MA

## 2018-07-25 ENCOUNTER — Other Ambulatory Visit: Payer: Self-pay | Admitting: Nurse Practitioner

## 2018-07-29 ENCOUNTER — Other Ambulatory Visit: Payer: Self-pay | Admitting: Nurse Practitioner

## 2018-07-29 DIAGNOSIS — E1165 Type 2 diabetes mellitus with hyperglycemia: Secondary | ICD-10-CM

## 2018-07-31 ENCOUNTER — Other Ambulatory Visit: Payer: Self-pay

## 2018-07-31 ENCOUNTER — Encounter: Payer: Self-pay | Admitting: Nurse Practitioner

## 2018-07-31 ENCOUNTER — Ambulatory Visit (INDEPENDENT_AMBULATORY_CARE_PROVIDER_SITE_OTHER): Payer: PPO | Admitting: Nurse Practitioner

## 2018-07-31 VITALS — BP 128/70 | HR 80 | Temp 97.6°F | Ht 62.4 in | Wt 206.6 lb

## 2018-07-31 DIAGNOSIS — I10 Essential (primary) hypertension: Secondary | ICD-10-CM | POA: Diagnosis not present

## 2018-07-31 DIAGNOSIS — Z7189 Other specified counseling: Secondary | ICD-10-CM

## 2018-07-31 DIAGNOSIS — E119 Type 2 diabetes mellitus without complications: Secondary | ICD-10-CM | POA: Insufficient documentation

## 2018-07-31 DIAGNOSIS — Z0000 Encounter for general adult medical examination without abnormal findings: Secondary | ICD-10-CM | POA: Diagnosis not present

## 2018-07-31 LAB — BMP8+EGFR
BUN/Creatinine Ratio: 20 (ref 12–28)
BUN: 17 mg/dL (ref 8–27)
CO2: 26 mmol/L (ref 20–29)
Calcium: 9 mg/dL (ref 8.7–10.3)
Chloride: 104 mmol/L (ref 96–106)
Creatinine, Ser: 0.86 mg/dL (ref 0.57–1.00)
GFR calc Af Amer: 79 mL/min/{1.73_m2} (ref >59)
GFR calc non Af Amer: 68 mL/min/{1.73_m2} (ref >59)
Glucose: 184 mg/dL — ABNORMAL HIGH (ref 65–99)
Potassium: 4.5 mmol/L (ref 3.5–5.2)
Sodium: 145 mmol/L — ABNORMAL HIGH (ref 134–144)

## 2018-07-31 LAB — HEMOGLOBIN A1C
Est. average glucose Bld gHb Est-mCnc: 160 mg/dL
Hgb A1c MFr Bld: 7.2 % — ABNORMAL HIGH (ref 4.8–5.6)

## 2018-07-31 MED ORDER — GLIMEPIRIDE 2 MG PO TABS: ORAL_TABLET | ORAL | 1 refills | Status: DC

## 2018-07-31 MED ORDER — GLIMEPIRIDE 2 MG PO TABS: 2.0000 mg | ORAL_TABLET | Freq: Every day | ORAL | 1 refills | Status: DC

## 2018-07-31 NOTE — Progress Notes (Signed)
Subjective:     Patient ID: Joanna Sandoval , female    DOB: 10-23-1945 , 73 y.o.   MRN: [id number]   Chief Complaint  Patient presents with  . 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  Diabetes  She presents for her follow-up diabetic visit. She has type 2 diabetes mellitus. There are no hypoglycemic associated symptoms. Pertinent negatives for hypoglycemia include no tremors. There are no diabetic associated symptoms. Pertinent negatives for diabetes include no chest pain and no weakness. There are no diabetic complications. Risk factors for coronary artery disease include diabetes mellitus, obesity, hypertension and sedentary lifestyle. She is compliant with treatment all of the time. Her weight is stable. She is following a generally unhealthy diet. When asked about meal planning, she reported none. She has not had a previous visit with a dietitian. She rarely participates in exercise. There is no change in her home blood glucose trend. (140 average blood sugar) An ACE inhibitor/angiotensin II receptor blocker is being taken. Eye exam is not current.     Past Medical History:  Diagnosis Date  . Arthritis   . Diabetes mellitus   . Hypercholesteremia   . Hypertension   . Post-menopausal bleeding      Family History  Problem Relation Age of Onset  . Hypertension Mother   . Hypertension Father   . Cancer Father        ?lung  . Diabetes Sister      Current Outpatient Medications:  .  albuterol (PROVENTIL HFA;VENTOLIN HFA) 108 (90 Base) MCG/ACT inhaler, Inhale 2 puffs into the lungs every 6 (six) hours as needed., Disp: , Rfl:  .  atenolol (TENORMIN) 100 MG tablet, Take 50 mg by mouth daily. , Disp: , Rfl:  .  atorvastatin (LIPITOR) 40 MG tablet, Take 1 tablet (40 mg total) by mouth daily. Take 1/2 pill at bedtime, Disp: 90 tablet, Rfl: 1 .  benzonatate (TESSALON PERLES) 100 MG capsule, Take 1 capsule (100 mg total) by mouth every 6 (six) hours as  needed for cough., Disp: 30 capsule, Rfl: 1 .  D3-50 50000 units capsule, , Disp: , Rfl: 0 .  glimepiride (AMARYL) 2 MG tablet, TAKE 2 TABLETS BY MOUTH EVERY DAY (Patient taking differently: 3 mg. ), Disp: 180 tablet, Rfl: 0 .  [name] 100 MG tablet, TAKE 1 TABLET(100 MG) BY MOUTH DAILY, Disp: 90 tablet, Rfl: 1 .  losartan (COZAAR) 50 MG tablet, Take 50 mg by mouth daily., Disp: , Rfl:    No Known Allergies   Review of Systems  Constitutional: Negative for chills, diaphoresis and fever.  Eyes: Negative for visual disturbance.  Respiratory: Negative for cough, chest tightness and shortness of breath.   Cardiovascular: Negative for chest pain, palpitations and leg swelling.  Gastrointestinal: Negative for abdominal pain, constipation and diarrhea.  Genitourinary: Negative for difficulty urinating and dysuria.  Musculoskeletal: Negative for gait problem.  Skin: Negative for rash and wound.  Neurological: Negative for tremors, weakness and numbness.     Today's Vitals   07/31/18 0835  BP: 128/70  Pulse: 80  Temp: 97.6 F (36.4 C)  TempSrc: Oral  SpO2: 95%  Weight: 206 lb 9.6 oz (93.7 kg)  Height: 5' 2.4" (1.585 m)   Body mass index is 37.3 kg/m.   Objective:  Physical Exam Vitals signs reviewed.  Constitutional:      Appearance: Normal appearance.  Cardiovascular:     Rate and Rhythm: Normal rate  and regular rhythm.     Pulses: Normal pulses.     Heart sounds: Normal heart sounds. No murmur.  Pulmonary:     Effort: Pulmonary effort is normal.     Breath sounds: Normal breath sounds.  Skin:    Capillary Refill: Capillary refill takes less than 2 seconds.  Neurological:     General: No focal deficit present.     Mental Status: She is alert and oriented to person, place, and time.  Psychiatric:        Mood and Affect: Mood normal.        Behavior: Behavior normal.        Thought Content: Thought content normal.        Judgment: Judgment normal.         Assessment  And Plan:   1. Type 2 diabetes mellitus without complication, without long-term current use of insulin (HCC)  Chronic, improving better control  Continue with current medications  Encouraged to limit intake of sugary foods and drinks  Encouraged to increase physical activity to 150 minutes per week as tolerated - glimepiride (AMARYL) 2 MG tablet; Take 1 tablet (2 mg total) by mouth daily.  Dispense: 135 tablet; Refill: 1 - Referral to [facility] Care Management Services  2. Essential hypertension . B/P is controlled.  [name] BMP ordered to check renal function.  . The importance of regular exercise and dietary modification was stressed to the patient.  . Stressed importance of losing ten percent of her body weight to help with B/P control.  . The weight loss would help with decreasing cardiac and cancer risk as well.      [name], [place]    COVID-19 Education: The signs and symptoms of COVID-19 were discussed with the patient and how to seek care for testing (follow up with PCP or arrange E-visit).  The importance of social distancing was discussed today.   Patient Risk:   After full review of this patients clinical status, I feel that they are at least moderate risk of 5 at this time.  THE PATIENT IS ENCOURAGED TO PRACTICE SOCIAL DISTANCING DUE TO THE COVID-19 PANDEMIC.

## 2018-08-01 ENCOUNTER — Telehealth: Payer: Self-pay

## 2018-08-01 NOTE — Telephone Encounter (Signed)
-----   Message from [name], [place] sent at 08/01/2018  1:34 PM EDT ----- Your HgbA1c is still 7.2, continue taking the [name] daily and hopefully we can work something out to get your medication.  Kidney functions are normal, make sure you are staying well hydrated.

## 2018-08-01 NOTE — Telephone Encounter (Signed)
1st attempt to give lab results  

## 2018-08-06 ENCOUNTER — Ambulatory Visit (INDEPENDENT_AMBULATORY_CARE_PROVIDER_SITE_OTHER): Payer: PPO

## 2018-08-06 DIAGNOSIS — E119 Type 2 diabetes mellitus without complications: Secondary | ICD-10-CM

## 2018-08-06 DIAGNOSIS — I10 Essential (primary) hypertension: Secondary | ICD-10-CM | POA: Diagnosis not present

## 2018-08-06 NOTE — Chronic Care Management (AMB) (Signed)
°Chronic Care Management  ° °Initial Visit Note ° °08/06/2018 °Name: Joanna Sandoval MRN: [id number] DOB: 09/12/1945 ° °Referred by: [name], [name], FNP °Reason for referral : Chronic Care Management (INITIAL CCM RN TELEPHONE FOLLOW UP ) ° ° °Joanna Sandoval is a 73 y.o. year old female who is a primary care patient of [name], [name], FNP. The CCM team was consulted for assistance with chronic disease management and care coordination needs.  ° °Review of patient status, including review of consultants reports, relevant laboratory and other test results, and collaboration with appropriate care team members and the patient's provider was performed as part of comprehensive patient evaluation and provision of chronic care management services.   ° °I spoke with Joanna Sandoval by telephone today.  ° °Objective:  °Lab Results  °Component Value Date  ° HGBA1C 7.2 (H) 07/31/2018  ° HGBA1C 7.6 (H) 04/30/2018  ° HGBA1C 8.2 (H) 01/16/2018  ° °Lab Results  °Component Value Date  ° MICROALBUR 10 06/19/2018  ° LDLCALC 86 04/30/2018  ° CREATININE 0.86 07/31/2018  ° °BP Readings from Last 3 Encounters:  °07/31/18 128/70  °06/19/18 140/68  °04/30/18 122/80  ° ° °Goals Addressed   °  ° Patient Stated  ° • "I would like for my Diabetes to be better managed" (pt-stated)     °  Current Barriers:  °• Knowledge Deficits related to Diabetes disease process and Self Health Management ° °Nurse Case Manager Clinical Goal(s):  °• Over the next 30 days, patient will verbalize basic understanding of Diabetes disease process and self health management plan as evidenced by patient will verbalize understanding of importance to adhering to her diabetic meal plan, report taking her diabetic medications exactly as prescribed and will verbalize knowing when to call the CCM team and or provider if needed for hypo/hyperglycemic events.  ° °Interventions:  °· Completed CCM RN initial telephone outreach with patient to assess for CCM needs and goal  setting °• Evaluation of current treatment plan related to diabetes management and patient's adherence to plan as established by provider. °• Reviewed patient's most recent A1C and discussed target A1C °• Mailed printed diabetes education for meal planning, Know Your A1C and s/s of hypo/hyperglycemia (to be reviewed at next call) °· Collaborated with embedded BSW Kendra Humble with request to contact patient for SDOH completion °• Discussed plans with patient for ongoing care management follow up and provided patient with direct contact information for care management team °• Provided RN CM contact # and hours of availability °· Scheduled a CCM follow up call with patient for about 2 weeks ° °Patient Self Care Activities:  °• Verbalizes understanding of the education and information provided today °• Self administers medications as prescribed °• Attends all scheduled provider appointments °• Calls pharmacy for medication refills °• Attends church or other social activities °• Performs ADL's independently °• Performs IADL's independently °• Calls provider office for new concerns or questions ° °Initial goal documentation °  ° • "My [name] is a little expensive" (pt-stated)     °  Current Barriers:  °• Knowledge Deficits related to resources to assist with cost of [name] °• Financial Constraints  ° °Nurse Case Manager Clinical Goal(s):  °• Over the next 30 days, patient will verbalize understanding of plan for cost assistance with [name].  ° °Interventions:  °· Completed CCM RN initial telephone outreach with patient to assess for CCM needs and goal setting °• Evaluation of current treatment plan related to diabetes management and patient's adherence to   to plan as established by provider.  Reviewed medications with patient and discussed importance of taking diabetic medications exactly as prescribed w/o missed doses (pt ran out for 2 months, [name], FNP provided samples)  Collaborated with embedded Pharm D  [name] regarding patient's reported financial difficulty for paying for her [name] (pt reports paying $45 for a 30 day supply)   Discussed plans with patient for ongoing care management follow up and provided patient with direct contact information for care management team  Provided RN CM contact # and hours of availability  Scheduled a CCM follow up call with patient for about 2 weeks  Patient Self Care Activities:   Verbalizes understanding of the education and information provided today  Self administers medications as prescribed  Attends all scheduled provider appointments  Calls pharmacy for medication refills  Attends church or other social activities  Performs ADL's independently  Performs IADL's independently  Calls provider office for new concerns or questions  Initial goal documentation        Joanna Sandoval was given information about Chronic Care Management services today including:  1. CCM service includes personalized support from designated clinical staff supervised by her physician, including individualized plan of care and coordination with other care providers 2. 24/7 contact phone numbers for assistance for urgent and routine care needs. 3. Service will only be billed when office clinical staff spend 20 minutes or more in a month to coordinate care. 4. Only one practitioner may furnish and bill the service in a calendar month. 5. The patient may stop CCM services at any time (effective at the end of the month) by phone call to the office staff. 6. The patient will be responsible for cost sharing (co-pay) of up to 20% of the service fee (after annual deductible is met).  Patient agreed to services and verbal consent obtained.   Telephone follow up appointment with CCM team member scheduled for: in about 2 weeks  [name], [facility] Care Management Coordinator [place] [facility]  Direct Phone: [phone]

## 2018-08-06 NOTE — Patient Instructions (Signed)
Visit Information  Goals Addressed      Patient Stated   . "I would like for my Diabetes to be better managed" (pt-stated)       Current Barriers:  [name] Knowledge Deficits related to Diabetes disease process and Self Health Management  Nurse Case Manager Clinical Goal(s):  [name] Over the next 30 days, patient will verbalize basic understanding of Diabetes disease process and self health management plan as evidenced by patient will verbalize understanding of importance to adhering to her diabetic meal plan, report taking her diabetic medications exactly as prescribed and will verbalize knowing when to call the CCM team and or provider if needed for hypo/hyperglycemic events.   Interventions:   Completed CCM RN initial telephone outreach with patient to assess for CCM needs and goal setting . Evaluation of current treatment plan related to diabetes management and patient's adherence to plan as established by provider. . Reviewed patient's most recent A1C and discussed target A1C . Mailed printed diabetes education for meal planning, Know Your A1C and s/s of hypo/hyperglycemia (to be reviewed at next call)  Collaborated with embedded BSW [name] with request to contact patient for SDOH completion . Discussed plans with patient for ongoing care management follow up and provided patient with direct contact information for care management team . Provided RN CM contact # and hours of availability  Scheduled a CCM follow up call with patient for about 2 weeks  Patient Self Care Activities:  [name] understanding of the education and information provided today . Self administers medications as prescribed . Attends all scheduled provider appointments . Calls pharmacy for medication refills . Attends church or other social activities . Performs ADL's independently . Performs IADL's independently . Calls provider office for new concerns or questions  Initial goal documentation    . "My  [name] is a [place] expensive" (pt-stated)       Current Barriers:  [name] Knowledge Deficits related to resources to assist with cost of [name] . Financial Constraints   Nurse Case Manager Clinical Goal(s):  [name] Over the next 30 days, patient will verbalize understanding of plan for cost assistance with [name].   Interventions:   Completed CCM RN initial telephone outreach with patient to assess for CCM needs and goal setting . Evaluation of current treatment plan related to diabetes management and patient's adherence to plan as established by provider. . Reviewed medications with patient and discussed importance of taking diabetic medications exactly as prescribed w/o missed doses (pt ran out for 2 months, [name], FNP provided samples) . Collaborated with embedded Pharm D [name] regarding patient's reported financial difficulty for paying for her [name] (pt reports paying $45 for a 30 day supply)  . Discussed plans with patient for ongoing care management follow up and provided patient with direct contact information for care management team . Provided RN CM contact # and hours of availability  Scheduled a CCM follow up call with patient for about 2 weeks  Patient Self Care Activities:   Verbalizes understanding of the education and information provided today . Self administers medications as prescribed . Attends all scheduled provider appointments . Calls pharmacy for medication refills . Attends church or other social activities . Performs ADL's independently . Performs IADL's independently . Calls provider office for new concerns or questions  Initial goal documentation       Ms. Jaso was given information about Chronic Care Management services today including:  1. CCM service includes personalized support from  designated clinical staff supervised by her physician, including individualized plan of care and coordination with other care providers 2. 24/7 contact  phone numbers for assistance for urgent and routine care needs. 3. Service will only be billed when office clinical staff spend 20 minutes or more in a month to coordinate care. 4. Only one practitioner may furnish and bill the service in a calendar month. 5. The patient may stop CCM services at any time (effective at the end of the month) by phone call to the office staff. 6. The patient will be responsible for cost sharing (co-pay) of up to 20% of the service fee (after annual deductible is met).  Patient agreed to services and verbal consent obtained.   The patient verbalized understanding of instructions provided today and declined a print copy of patient instruction materials.   Telephone follow up appointment with CCM team member scheduled for: about 2 weeks  [name], [facility] Care Management Coordinator [place] [facility]  Direct Phone: [phone]

## 2018-08-07 ENCOUNTER — Ambulatory Visit: Payer: Self-pay

## 2018-08-07 DIAGNOSIS — I10 Essential (primary) hypertension: Secondary | ICD-10-CM

## 2018-08-07 DIAGNOSIS — E119 Type 2 diabetes mellitus without complications: Secondary | ICD-10-CM

## 2018-08-07 NOTE — Chronic Care Management (AMB) (Signed)
  Chronic Care Management   Social Work Note  08/07/2018 Name: Joanna Sandoval MRN: [id number] DOB: [birth year]  Joanna Sandoval is a 73 y.o. year old female who sees [name], [place] for primary care. The CCM team was consulted for assistance with chronic care management.  I placed an outreach call to the patient to complete SW screening. The patient stated it was not a good time to talk and asked this SW call back at a later time.  Follow Up Plan: Appointment scheduled for SW follow up with client by phone on: 08/08/18 after 3:00 pm.  [name], BSW, CDP [facility] [place] [facility] Social Worker [phone]  Total time spent performing care coordination and/or care management activities with the patient by phone or face to face = 5 minutes.

## 2018-08-08 ENCOUNTER — Ambulatory Visit: Payer: Self-pay

## 2018-08-08 DIAGNOSIS — E119 Type 2 diabetes mellitus without complications: Secondary | ICD-10-CM

## 2018-08-08 DIAGNOSIS — I10 Essential (primary) hypertension: Secondary | ICD-10-CM

## 2018-08-08 NOTE — Patient Instructions (Signed)
Social Worker Visit Information  Goals we discussed today:  Goals Addressed            This Visit's Progress     Patient Stated   . "I am worried about Coronavirus" (pt-stated)       Current Barriers:  [name] Knowledge Deficits related to COVID-19 and impact on patient self health management  Clinical Goal(s):  [name] Over the next 30 days, patient will verbalize basic understanding of COVID-19 impact on individual health and self health management as evidenced by verbalization of basic understanding of COVID-19 as a viral disease, measures to prevent exposure, signs and symptoms, when to contact provider  Interventions: . Provided education to patient re: COVID 19 . Discussed plans with patient for ongoing care management follow up and provided patient with direct contact information for care management team  . Encouraged the patient to continue wearing a mask when in public and limit time out of her home . Provided supportive listening to the patient who's sister has tested positive for COVID 19  Patient Self Care Activities:  . Self administers medications as prescribed . Attends church or other social activities . Performs ADL's independently . Calls provider office for new concerns or questions  Initial goal documentation          Materials Provided: No: Patient declined  Follow Up Plan: SW will follow up with patient by phone over the next month   [name], BSW, CDP [facility] Care Management Social Worker [phone]

## 2018-08-08 NOTE — Chronic Care Management (AMB) (Signed)
  Chronic Care Management    Clinical Social Work General Note  08/08/2018 Name: Joanna Sandoval MRN: [id number] DOB: 1945/08/04  Joanna Sandoval is a 73 y.o. year old female who is a primary care patient of [name], [place]. The CCM was consulted to assist the patient with care coordination of community resource needs.   Review of patient status, including review of consultants reports, relevant laboratory and other test results, and collaboration with appropriate care team members and the patient's provider was performed as part of comprehensive patient evaluation and provision of chronic care management services.     SDOH (Social Determinants of Health) screening performed today. The patient identifies challenges related to food insecurities and stress. The patient receives SSI below the Medicaid and Food and Nutrition Support eligibility guidelines. However, the patient also works a part-time job which puts her over the income limit to receive these benefits. The patient reports she used to have food stamps but they were taken away due to income. SW discussed options to visit local food pantries. The patient reports she is established with a supportive church who has a Building surveyor which she is able to access if needed. The patient declined all SW resources stating "I'm in a good place right now". The patient is able to acknowledge stressors and hardship but reports she has been in worse positions in the past and her faith helps her to move forward.  The patient is concerned about COVID 19 pandemic and reports worry and stress over her sister who has just tested positive. At this time her sister is home and self-isolating.   Goals Addressed            This Visit's Progress     Patient Stated   . "I am worried about Coronavirus" (pt-stated)       Current Barriers:  [name] Knowledge Deficits related to COVID-19 and impact on patient self health management  Clinical Goal(s):  [name] Over the next  30 days, patient will verbalize basic understanding of COVID-19 impact on individual health and self health management as evidenced by verbalization of basic understanding of COVID-19 as a viral disease, measures to prevent exposure, signs and symptoms, when to contact provider  Interventions: . Provided education to patient re: COVID 19 . Discussed plans with patient for ongoing care management follow up and provided patient with direct contact information for care management team  . Encouraged the patient to continue wearing a mask when in public and limit time out of her home . Provided supportive listening to the patient who's sister has tested positive for COVID 19  Patient Self Care Activities:  . Self administers medications as prescribed . Attends church or other social activities . Performs ADL's independently . Calls provider office for new concerns or questions  Initial goal documentation       Follow Up Plan: SW will follow up with patient by phone over the next month.       [name], BSW, CDP [facility] Care Management Social Worker [phone]  Total time spent performing care coordination and/or care management activities with the patient by phone or face to face = 40 minutes.

## 2018-08-16 ENCOUNTER — Ambulatory Visit: Payer: Self-pay | Admitting: Pharmacist

## 2018-08-16 DIAGNOSIS — I10 Essential (primary) hypertension: Secondary | ICD-10-CM

## 2018-08-16 DIAGNOSIS — E119 Type 2 diabetes mellitus without complications: Secondary | ICD-10-CM

## 2018-08-16 NOTE — Progress Notes (Signed)
  Chronic Care Management   Visit Note  08/16/2018 Name: Joanna Sandoval MRN: [id number] DOB: [birth year]  Referred by: [name], FNP Reason for referral : Chronic Care Management   [name] is a 73 y.o. year old female who is a primary care patient of [name], [place]. The CCM team was consulted for assistance with chronic disease management and care coordination needs.   Review of patient status, including review of consultants reports, relevant laboratory and other test results, and collaboration with appropriate care team members and the patient's provider was performed as part of comprehensive patient evaluation and provision of chronic care management services.    I spoke with [name] by telephone today.  Objective:   Goals Addressed            This Visit's Progress     Patient Stated   . "My [name] is a little expensive" (pt-stated)       Current Barriers:  [name] Knowledge Deficits related to resources to assist with cost of [name] . Photographer & Pharmacist Clinical Goal(s):  [name] Over the next 30 days, patient will work with CCM team to apply for Patient assistance for [place].   Interventions:  . Completed comprehensive medication review . Evaluation of current treatment plan related to diabetes management and patient's adherence to plan as established by provider. . Reviewed medications (updated med list on 08/16/18) with patient and discussed importance of taking diabetic medications exactly as prescribed w/o missed doses (pt ran out for 2 months, [name], FNP provided samples, continue samples as able) . Applications for patient assistance for [name] mailed to patient and provider portion has been sent to PCP.  Will follow up PAP process. . Discussed plans with patient for ongoing care management follow up and provided patient with direct contact information for care management team . Provided RN CM & PharmD contact #  and hours of availability  Scheduled a CCM follow up call with patient for about 2 weeks  Patient Self Care Activities:   Verbalizes understanding of the education and information provided today . Self administers medications as prescribed . Attends all scheduled provider appointments . Calls pharmacy for medication refills . Attends church or other social activities . Performs ADL's independently . Performs IADL's independently . Calls provider office for new concerns or questions  Please see past updates related to this goal by clicking on the "Past Updates" button in the selected goal         PLAN:  -The CM team will reach out to the patient again over the next 14 days.    Regina Eck, PharmD, BCPS Clinical Pharmacist, [facility] [place]: [phone]

## 2018-08-19 ENCOUNTER — Other Ambulatory Visit: Payer: Self-pay | Admitting: Pharmacy Technician

## 2018-08-19 NOTE — Patient Outreach (Signed)
[facility]) Care Management  08/19/2018  Joanna Sandoval January 16, 1946 [id number]                          Medication Assistance Referral  Referral From: [facility] RPh Jenne Pane [facility] RPh)  Medication/Company: [name] and Proventil HFA / [employer] Patient application portion:  Education officer, museum portion: [employer] to [employer]. [name], FNP-BC  Follow up:  Will collaborate follow up with Almyra Free  in 7-10 business days to confirm application(s) have been received.  Maud Deed Chana Bode [place] Certified Pharmacy Technician [place] [facility] Direct [phone]

## 2018-08-19 NOTE — Patient Instructions (Signed)
Visit Information  Goals Addressed            This Visit's Progress     Patient Stated   . "My Joanna Sandoval is a little expensive" (pt-stated)       Current Barriers:  [name] Knowledge Deficits related to resources to assist with cost of [name] . Photographer & Pharmacist Clinical Goal(s):  [name] Over the next 30 days, patient will work with CCM team to apply for Patient assistance for [place].   Interventions:  . Completed comprehensive medication review . Evaluation of current treatment plan related to diabetes management and patient's adherence to plan as established by provider. . Reviewed medications (updated med list on 08/16/18) with patient and discussed importance of taking diabetic medications exactly as prescribed w/o missed doses (pt ran out for 2 months, [name], FNP provided samples, continue samples as able) . Discussed plans with patient for ongoing care management follow up and provided patient with direct contact information for care management team . Provided RN CM & PharmD contact # and hours of availability  Scheduled a CCM follow up call with patient for about 2 weeks  Patient Self Care Activities:   Verbalizes understanding of the education and information provided today . Self administers medications as prescribed . Attends all scheduled provider appointments . Calls pharmacy for medication refills . Attends church or other social activities . Performs ADL's independently . Performs IADL's independently . Calls provider office for new concerns or questions  Please see past updates related to this goal by clicking on the "Past Updates" button in the selected goal          The patient verbalized understanding of instructions provided today and declined a print copy of patient instruction materials.   The CM team will reach out to the patient again over the next 14 days.   Regina Eck, PharmD, BCPS Clinical  Pharmacist, [facility] [place]: [phone]

## 2018-08-19 NOTE — Chronic Care Management (AMB) (Signed)
  Chronic Care Management   Visit Note  08/16/2018 Name: Joanna Sandoval MRN: [id number] DOB: [birth year]  Referred by: Joanna Brine, FNP Reason for referral : Chronic Care Management   Joanna Sandoval is a 73 y.o. year old female who is a primary care patient of Joanna Sandoval, Joanna Sandoval. The CCM team was consulted for assistance with chronic disease management and care coordination needs.   Review of patient status, including review of consultants reports, relevant laboratory and other test results, and collaboration with appropriate care team members and the patient's provider was performed as part of comprehensive patient evaluation and provision of chronic care management services.    I spoke with [name] by telephone today.  Objective:   Goals Addressed            This Visit's Progress     Patient Stated   . "My Joanna Sandoval is a little expensive" (pt-stated)       Current Barriers:  [name] Knowledge Deficits related to resources to assist with cost of Joanna Sandoval . Photographer & Pharmacist Clinical Goal(s):  [name] Over the next 30 days, patient will work with CCM team to apply for Patient assistance for Joanna Sandoval.   Interventions:  . Completed comprehensive medication review . Evaluation of current treatment plan related to diabetes management and patient's adherence to plan as established by provider. . Reviewed medications (updated med list on 08/16/18) with patient and discussed importance of taking diabetic medications exactly as prescribed w/o missed doses (pt ran out for 2 months, Joanna Brine, FNP provided samples, continue samples as able) . Applications for patient assistance for Joanna Sandoval mailed to patient and provider portion has been sent to PCP.  Will follow up PAP process. . Discussed plans with patient for ongoing care management follow up and provided patient with direct contact information for care management team . Provided RN CM & PharmD contact #  and hours of availability  Scheduled a CCM follow up call with patient for about 2 weeks  Patient Self Care Activities:   Verbalizes understanding of the education and information provided today . Self administers medications as prescribed . Attends all scheduled provider appointments . Calls pharmacy for medication refills . Attends church or other social activities . Performs ADL's independently . Performs IADL's independently . Calls provider office for new concerns or questions  Please see past updates related to this goal by clicking on the "Past Updates" button in the selected goal         PLAN:  -The CM team will reach out to the patient again over the next 14 days.    Joanna Sandoval, PharmD, BCPS Clinical Pharmacist, [facility] [name]: [phone]

## 2018-08-20 ENCOUNTER — Other Ambulatory Visit: Payer: Self-pay

## 2018-08-20 ENCOUNTER — Ambulatory Visit (INDEPENDENT_AMBULATORY_CARE_PROVIDER_SITE_OTHER): Payer: PPO

## 2018-08-20 ENCOUNTER — Telehealth: Payer: Self-pay

## 2018-08-20 DIAGNOSIS — E119 Type 2 diabetes mellitus without complications: Secondary | ICD-10-CM

## 2018-08-20 DIAGNOSIS — I10 Essential (primary) hypertension: Secondary | ICD-10-CM | POA: Diagnosis not present

## 2018-08-21 NOTE — Chronic Care Management (AMB) (Signed)
  Chronic Care Management   Follow Up Note   08/21/2018 Name: Joanna Sandoval MRN: [id number] DOB: 12-Aug-1945  Referred by: Joanna Brine, FNP Reason for referral : Chronic Care Management (CCM RN Telephone Follow Up )   Joanna Sandoval is a 73 y.o. year old female who is a primary care patient of Joanna Sandoval, [place]. The CCM team was consulted for assistance with chronic disease management and care coordination needs.    Review of patient status, including review of consultants reports, relevant laboratory and other test results, and collaboration with appropriate care team members and the patient's provider was performed as part of comprehensive patient evaluation and provision of chronic care management services.    I spoke with Joanna Sandoval by telephone today to f/u on her Diabetes.   Goals Addressed      Patient Stated   . "I would like for my Diabetes to be better managed" (pt-stated)       Current Barriers:  [name] Knowledge Deficits related to Diabetes disease process and Self Health Management  Nurse Case Manager Clinical Goal(s):  [name] Over the next 30 days, patient will verbalize basic understanding of Diabetes disease process and self health management plan as evidenced by patient will verbalize understanding of importance to adhering to her diabetic meal plan, report taking her diabetic medications exactly as prescribed and will verbalize knowing when to call the CCM team and or provider if needed for hypo/hyperglycemic events.   Interventions:   Completed CCM RN Telephone Follow Up call with patient  . Evaluation of current treatment plan related to diabetes management and patient's adherence to plan as established by provider. . Reviewed mailed printed diabetes education for meal planning, Know Your A1C and s/s of hypo/hyperglycemia  . Provided patient parameters for diabetes home action plan for hypo/hyperglycemia and how to manage for BS<80 and or >250  Educated patient on  recommendations for carb amount for snacks and meals . Discussed plans with patient for ongoing care management follow up and provided patient with direct contact information for care management team . Provided RN CM contact # and hours of availability  Scheduled a CCM follow up call with patient for about 2 weeks  Patient Self Care Activities:  Joanna Sandoval understanding of the education and information provided today . Self administers medications as prescribed . Attends all scheduled provider appointments . Calls pharmacy for medication refills . Attends church or other social activities . Performs ADL's independently . Performs IADL's independently . Calls provider office for new concerns or questions  Please see past updates related to this goal by clicking on the "Past Updates" button in the selected goal         The CM team will reach out to the patient again over the next 7-14 days.    Joanna Merino, RN,CCM Care Management Coordinator [place] [facility]  Direct Phone: [phone]

## 2018-08-21 NOTE — Patient Instructions (Signed)
Visit Information  Goals Addressed      Patient Stated   . "I would like for my Diabetes to be better managed" (pt-stated)       Current Barriers:  [name] Knowledge Deficits related to Diabetes disease process and Self Health Management  Nurse Case Manager Clinical Goal(s):  [name] Over the next 30 days, patient will verbalize basic understanding of Diabetes disease process and self health management plan as evidenced by patient will verbalize understanding of importance to adhering to her diabetic meal plan, report taking her diabetic medications exactly as prescribed and will verbalize knowing when to call the CCM team and or provider if needed for hypo/hyperglycemic events.   Interventions:   Completed CCM RN Telephone Follow Up call with patient  . Evaluation of current treatment plan related to diabetes management and patient's adherence to plan as established by provider. . Reviewed mailed printed diabetes education for meal planning, Know Your A1C and s/s of hypo/hyperglycemia  . Provided patient parameters for diabetes home action plan for hypo/hyperglycemia and how to manage for BS<80 and or >250  Educated patient on recommendations for carb amount for snacks and meals . Discussed plans with patient for ongoing care management follow up and provided patient with direct contact information for care management team . Provided RN CM contact # and hours of availability  Scheduled a CCM follow up call with patient for about 2 weeks  Patient Self Care Activities:  [name] understanding of the education and information provided today . Self administers medications as prescribed . Attends all scheduled provider appointments . Calls pharmacy for medication refills . Attends church or other social activities . Performs ADL's independently . Performs IADL's independently . Calls provider office for new concerns or questions  Please see past updates related to this goal by clicking on the  "Past Updates" button in the selected goal          The patient verbalized understanding of instructions provided today and declined a print copy of patient instruction materials.   The CM team will reach out to the patient again over the next 7-14 days.   [name], RN,CCM Care Management Coordinator [place] [facility]  Direct Phone: [phone]

## 2018-08-27 ENCOUNTER — Ambulatory Visit: Payer: Self-pay

## 2018-08-27 ENCOUNTER — Telehealth: Payer: Self-pay

## 2018-08-27 DIAGNOSIS — E119 Type 2 diabetes mellitus without complications: Secondary | ICD-10-CM

## 2018-08-27 DIAGNOSIS — I10 Essential (primary) hypertension: Secondary | ICD-10-CM

## 2018-08-27 NOTE — Chronic Care Management (AMB) (Signed)
  Chronic Care Management   Outreach Note  08/27/2018 Name: Joanna Sandoval MRN: [id number] DOB: Apr 05, 1946  Referred by: [name], FNP Reason for referral : Care Coordination   An unsuccessful telephone outreach was attempted today. The patient was referred to the case management team by for assistance with chronic care management and care coordination.   Follow Up Plan: A HIPPA compliant phone message was left for the patient providing contact information and requesting a return call.  The CM team will reach out to the patient again over the next 7-10 days.   [name], BSW, CDP [facility] Care Management Social Worker [phone]  Total time spent performing care coordination and/or care management activities with the patient by phone or face to face = 3 minutes.

## 2018-08-30 ENCOUNTER — Telehealth: Payer: Self-pay

## 2018-09-02 ENCOUNTER — Ambulatory Visit: Payer: Self-pay

## 2018-09-02 ENCOUNTER — Telehealth: Payer: Self-pay

## 2018-09-02 DIAGNOSIS — E119 Type 2 diabetes mellitus without complications: Secondary | ICD-10-CM

## 2018-09-02 DIAGNOSIS — I10 Essential (primary) hypertension: Secondary | ICD-10-CM

## 2018-09-02 NOTE — Patient Instructions (Signed)
Social Worker Visit Information  Goals we discussed today:  Goals Addressed            This Visit's Progress     Patient Stated   . COMPLETED: "I am worried about Coronavirus" (pt-stated)       Current Barriers:  [name] Knowledge Deficits related to COVID-19 and impact on patient self health management  Clinical Goal(s):  [name] Over the next 30 days, patient will verbalize basic understanding of COVID-19 impact on individual health and self health management as evidenced by verbalization of basic understanding of COVID-19 as a viral disease, measures to prevent exposure, signs and symptoms, when to contact provider  Interventions: . Telephponic follow up by CCM SW . Assessed the patient for knowledge of COVID 19 - the patient is able to verbalize safety measures to staying healthy including limiting social interaction and wearing a mask when out of the home. The patient confirms she keeps a mask in her car as well as in her purse. [name] Determined the patients sister who had recently tested positive for COVID 19 has recovered and is back at work . Advised the patient to continue practicing social distancing, wearing a mask, and washing hands.  Patient Self Care Activities:  . Self administers medications as prescribed . Attends church or other social activities . Performs ADL's independently . Calls provider office for new concerns or questions  Please see past updates related to this goal by clicking on the "Past Updates" button in the selected goal           Materials Provided: No: Patient declined  Follow Up Plan: No planned follow up by CCM SW at this time   [name], [place], CDP [facility] [place] [facility] Social Worker [phone]

## 2018-09-02 NOTE — Chronic Care Management (AMB) (Signed)
  Chronic Care Management    Clinical Social Work Follow Up Note  09/02/2018 Name: Joanna Sandoval MRN: [id number] DOB: 1945/11/13  Joanna Sandoval is a 73 y.o. year old female who is a primary care patient of [name], [place]. The CCM team was consulted for assistance with [employer].   Review of patient status, including review of consultants reports, other relevant assessments, and collaboration with appropriate care team members and the patient's provider was performed as part of comprehensive patient evaluation and provision of chronic care management services.     I placed a follow up call to the patient to assess progress of below patient stated goal. Goals Addressed            This Visit's Progress     Patient Stated   . COMPLETED: "I am worried about Coronavirus" (pt-stated)       Current Barriers:  [name] Knowledge Deficits related to COVID-19 and impact on patient self health management  Clinical Goal(s):  [name] Over the next 30 days, patient will verbalize basic understanding of COVID-19 impact on individual health and self health management as evidenced by verbalization of basic understanding of COVID-19 as a viral disease, measures to prevent exposure, signs and symptoms, when to contact provider  Interventions: . Telephponic follow up by CCM SW . Assessed the patient for knowledge of COVID 19 - the patient is able to verbalize safety measures to staying healthy including limiting social interaction and wearing a mask when out of the home. The patient confirms she keeps a mask in her car as well as in her purse. [name] Determined the patients sister who had recently tested positive for COVID 19 has recovered and is back at work . Advised the patient to continue practicing social distancing, wearing a mask, and washing hands.  Patient Self Care Activities:  . Self administers medications as prescribed . Attends church or other social activities . Performs ADL's  independently . Calls provider office for new concerns or questions  Please see past updates related to this goal by clicking on the "Past Updates" button in the selected goal         Follow Up Plan: No further CCM SW follow up planned at this time. The patient is encouraged to contact CCM SW with future needs.   [name], BSW, CDP [facility] Care Management Social Worker [phone]  Total time spent performing care coordination and/or care management activities with the patient by phone or face to face = 10 minutes.

## 2018-09-05 ENCOUNTER — Ambulatory Visit: Payer: Self-pay | Admitting: Pharmacist

## 2018-09-05 DIAGNOSIS — E119 Type 2 diabetes mellitus without complications: Secondary | ICD-10-CM | POA: Diagnosis not present

## 2018-09-05 DIAGNOSIS — I10 Essential (primary) hypertension: Secondary | ICD-10-CM | POA: Diagnosis not present

## 2018-09-05 NOTE — Progress Notes (Signed)
  Chronic Care Management   Visit Note  09/05/2018 Name: Koree Staheli MRN: [id number] DOB: [birth year]  Referred by: [name], FNP Reason for referral : Chronic Care Management   Cliffie Gingras is a 73 y.o. year old female who is a primary care patient of [name], [place]. The CCM team was consulted for assistance with chronic disease management and care coordination needs.   Review of patient status, including review of consultants reports, relevant laboratory and other test results, and collaboration with appropriate care team members and the patient's provider was performed as part of comprehensive patient evaluation and provision of chronic care management services.    I spoke with [name] by telephone today.  Objective:   Goals Addressed            This Visit's Progress     Patient Stated   . "My [name] is a little expensive" (pt-stated)       Current Barriers:  [name] Knowledge Deficits related to resources to assist with cost of [name] . Photographer & Pharmacist Clinical Goal(s):  [name] Over the next 30 days, patient will work with CCM team to apply for Patient assistance for [place].   Interventions:  . Completed comprehensive medication review . Evaluation of current treatment plan related to diabetes management and patient's adherence to plan as established by provider. . Reviewed medications with patient and discussed importance of taking diabetic medications exactly as prescribed w/o missed doses ([name] samples per [name], FNP until PAP process completed -->~3-4 weeks . Applications for patient assistance for [name] mailed to patient Awaiting patient portion-->patient to call me back on 04/18/56 regarding application information since she was currently at work on her break.  PCP portion obtained & completed. [name] Updated med list on 09/04/18-->Aspirin 81mg  daily was missing from list; pertinent in DM patient) - patient  reports taking . Discussed plans with patient for ongoing care management follow up and provided patient with direct contact information for care management team . Provided RN CM & PharmD contact # and hours of availability  Scheduled a CCM follow up call with patient for about 2 weeks  Patient Self Care Activities:   Verbalizes understanding of the education and information provided today . Self administers medications as prescribed . Attends all scheduled provider appointments . Calls pharmacy for medication refills . Attends church or other social activities . Performs ADL's independently . Performs IADL's independently . Calls provider office for new concerns or questions  Please see past updates related to this goal by clicking on the "Past Updates" button in the selected goal           Plan:   The CM team will reach out to the patient again over the next 2 weeks days.   Regina Eck, PharmD, BCPS Clinical Pharmacist, [facility] [place]: [phone]

## 2018-09-05 NOTE — Patient Instructions (Signed)
Visit Information  Goals Addressed            This Visit's Progress     Patient Stated   . "My Joanna Sandoval is a little expensive" (pt-stated)       Current Barriers:  [name] Knowledge Deficits related to resources to assist with cost of [name] . Photographer & Pharmacist Clinical Goal(s):  [name] Over the next 30 days, patient will work with CCM team to apply for Patient assistance for [place].   Interventions:  . Completed comprehensive medication review . Evaluation of current treatment plan related to diabetes management and patient's adherence to plan as established by provider. . Reviewed medications with patient and discussed importance of taking diabetic medications exactly as prescribed w/o missed doses ([name] samples per [name], FNP until PAP process completed -->~3-4 weeks . Applications for patient assistance for [name] mailed to patient Awaiting patient portion-->patient to call me back on 2/67/12 regarding application information since she was currently at work on her break.  PCP portion obtained & completed. [name] Updated med list on 09/04/18-->Aspirin 81mg  daily was missing from list; pertinent in DM patient) - patient reports taking . Discussed plans with patient for ongoing care management follow up and provided patient with direct contact information for care management team . Provided RN CM & PharmD contact # and hours of availability  Scheduled a CCM follow up call with patient for about 2 weeks  Patient Self Care Activities:   Verbalizes understanding of the education and information provided today . Self administers medications as prescribed . Attends all scheduled provider appointments . Calls pharmacy for medication refills . Attends church or other social activities . Performs ADL's independently . Performs IADL's independently . Calls provider office for new concerns or questions  Please see past updates related to this goal by  clicking on the "Past Updates" button in the selected goal          The patient verbalized understanding of instructions provided today and declined a print copy of patient instruction materials.   The CM team will reach out to the patient again over the next 2 weeks.  Regina Eck, PharmD, BCPS Clinical Pharmacist, [facility] [place]: [phone]

## 2018-09-11 ENCOUNTER — Telehealth: Payer: Self-pay

## 2018-09-13 ENCOUNTER — Ambulatory Visit: Payer: Self-pay | Admitting: Pharmacist

## 2018-09-13 DIAGNOSIS — E119 Type 2 diabetes mellitus without complications: Secondary | ICD-10-CM

## 2018-09-13 DIAGNOSIS — I10 Essential (primary) hypertension: Secondary | ICD-10-CM

## 2018-09-13 NOTE — Progress Notes (Signed)
Chronic Care Management   Visit Note  09/13/2018 Name: Joanna Sandoval MRN: [id number] DOB: 17-Mar-1946  Referred by: [name], FNP Reason for referral : Chronic Care Management   Joanna Sandoval is a 73 y.o. year old female who is a primary care patient of [name], [place]. The CCM team was consulted for assistance with chronic disease management and care coordination needs.   Review of patient status, including review of consultants reports, relevant laboratory and other test results, and collaboration with appropriate care team members and the patient's provider was performed as part of comprehensive patient evaluation and provision of chronic care management services.    I spoke with Joanna Sandoval by telephone today  Objective:   Goals Addressed            This Visit's Progress     Patient Stated   . "I would like for my Diabetes to be better managed" (pt-stated)       Current Barriers:  [name] Knowledge Deficits related to Diabetes disease process and Self Health Management  Nurse Case Manager/PharmD Clinical Goal(s):  [name] Over the next 30 days, patient will verbalize basic understanding of Diabetes disease process and self health management plan as evidenced by patient will verbalize understanding of importance to adhering to her diabetic meal plan, report taking her diabetic medications exactly as prescribed and will verbalize knowing when to call the CCM team and or provider if needed for hypo/hyperglycemic events.   Interventions:   Completed CCM PharmD Telephone Follow Up call with patient  . Evaluation of current treatment plan related to diabetes management and patient's adherence to plan as established by provider. . Reviewed mailed printed diabetes education for meal planning, Know Your A1C and s/s of hypo/hyperglycemia  . Provided patient parameters for diabetes home action plan for hypo/hyperglycemia and how to manage for BS<80 and or >250 . Patient denies  hypoglycemia, reports BGs are within goal range  Educated patient on recommendations for carb amount for snacks and meals . Discussed plans with patient for ongoing care management follow up and provided patient with direct contact information for care management team . Provided RN CM contact # and hours of availability  Scheduled a CCM follow up call with patient for about 3 weeks  Patient Self Care Activities:  Joanna Sandoval understanding of the education and information provided today . Self administers medications as prescribed . Attends all scheduled provider appointments . Calls pharmacy for medication refills . Attends church or other social activities . Performs ADL's independently . Performs IADL's independently . Calls provider office for new concerns or questions  Please see past updates related to this goal by clicking on the "Past Updates" button in the selected goal       . "Some of my medications are expensive" (pt-stated)       Current Barriers:  [name] Knowledge Deficits related to resources to assist with cost of [name] & albuterol inhaler . Photographer & Pharmacist Clinical Goal(s):  [name] Over the next 30 days, patient will work with CCM team to apply for Patient assistance for [name] & albuterol.   Interventions:  . Completed comprehensive medication review . Evaluation of current treatment plan related to diabetes management and patient's adherence to plan as established by provider. . Reviewed medications with patient and discussed importance of taking diabetic medications exactly as prescribed w/o missed doses ([name] samples per [name], FNP until PAP process completed -->~3-4 weeks . Updated med list on  09/04/18-->Aspirin 81mg  daily was missing from list; pertinent in DM patient) - patient reports taking . Assisted patient in filling out application by telephone.  PCP portion obtained & completed. . Discussed plans with patient  for ongoing care management follow up and provided patient with direct contact information for care management team . Provided RN CM & PharmD contact # and hours of availability  Scheduled a CCM follow up call with patient for about 3 weeks  Patient Self Care Activities:   Verbalizes understanding of the education and information provided today . Self administers medications as prescribed . Attends all scheduled provider appointments . Calls pharmacy for medication refills . Attends church or other social activities . Performs ADL's independently . Performs IADL's independently . Calls provider office for new concerns or questions  Please see past updates related to this goal by clicking on the "Past Updates" button in the selected goal           Plan:   The care management team will reach out to the patient again over the next 21 days.   Provider Signature

## 2018-09-13 NOTE — Patient Instructions (Signed)
Visit Information  Goals Addressed            This Visit's Progress     Patient Stated   . "I would like for my Diabetes to be better managed" (pt-stated)       Current Barriers:  [name] Knowledge Deficits related to Diabetes disease process and Self Health Management  Nurse Case Manager Clinical Goal(s):  [name] Over the next 30 days, patient will verbalize basic understanding of Diabetes disease process and self health management plan as evidenced by patient will verbalize understanding of importance to adhering to her diabetic meal plan, report taking her diabetic medications exactly as prescribed and will verbalize knowing when to call the CCM team and or provider if needed for hypo/hyperglycemic events.   Interventions:   Completed CCM RN Telephone Follow Up call with patient  . Evaluation of current treatment plan related to diabetes management and patient's adherence to plan as established by provider. . Reviewed mailed printed diabetes education for meal planning, Know Your A1C and s/s of hypo/hyperglycemia  . Provided patient parameters for diabetes home action plan for hypo/hyperglycemia and how to manage for BS<80 and or >250 . Patient denies hypoglycemia, reports BGs are within goal range  Educated patient on recommendations for carb amount for snacks and meals . Discussed plans with patient for ongoing care management follow up and provided patient with direct contact information for care management team . Provided RN CM contact # and hours of availability  Scheduled a CCM follow up call with patient for about 3 weeks  Patient Self Care Activities:  [name] understanding of the education and information provided today . Self administers medications as prescribed . Attends all scheduled provider appointments . Calls pharmacy for medication refills . Attends church or other social activities . Performs ADL's independently . Performs IADL's independently . Calls provider  office for new concerns or questions  Please see past updates related to this goal by clicking on the "Past Updates" button in the selected goal       . "Some of my medications are expensive" (pt-stated)       Current Barriers:  [name] Knowledge Deficits related to resources to assist with cost of [name] & albuterol inhaler . Photographer & Pharmacist Clinical Goal(s):  [name] Over the next 30 days, patient will work with CCM team to apply for Patient assistance for [name] & albuterol.   Interventions:  . Completed comprehensive medication review . Evaluation of current treatment plan related to diabetes management and patient's adherence to plan as established by provider. . Reviewed medications with patient and discussed importance of taking diabetic medications exactly as prescribed w/o missed doses ([name] samples per [name], FNP until PAP process completed -->~3-4 weeks . Updated med list on 09/04/18-->Aspirin 81mg  daily was missing from list; pertinent in DM patient) - patient reports taking . Assisted patient in filling out application by telephone.  PCP portion obtained & completed. . Discussed plans with patient for ongoing care management follow up and provided patient with direct contact information for care management team . Provided RN CM & PharmD contact # and hours of availability  Scheduled a CCM follow up call with patient for about 3 weeks  Patient Self Care Activities:   Verbalizes understanding of the education and information provided today . Self administers medications as prescribed . Attends all scheduled provider appointments . Calls pharmacy for medication refills . Attends church or other social activities .  Performs ADL's independently . Performs IADL's independently . Calls provider office for new concerns or questions  Please see past updates related to this goal by clicking on the "Past Updates" button in the selected goal           The patient verbalized understanding of instructions provided today and declined a print copy of patient instruction materials.   The care management team will reach out to the patient again over the next 21 days.   Regina Eck, PharmD, BCPS Clinical Pharmacist, [facility] [place]: [phone]

## 2018-09-19 ENCOUNTER — Other Ambulatory Visit: Payer: Self-pay | Admitting: Pharmacy Technician

## 2018-09-19 NOTE — Patient Outreach (Signed)
[facility]) Care Management  09/19/2018  Shruthi Northrup April 29, 1945 [id number]    Received patient portion(s) of patient assistance application for [name] and Proventil HFA.   Successful outreach call made to patient, HIPAA identified, to verify patients income. Patients states that she works part time and was able to tell me about how much she makes a year doing so. Ms. Penninger confirms that she also receives SS income however is at work and is unable to tell me exactly how much its for monthly. Requested that Ms. Chalfin contact myself of [employer] [name] to inform us so that I can mail completed application into [employer]. She stated she would.  Will follow up with Almyra Free and/or patient in 2-3 business days if I have not been informed.  Maud Deed Chana Bode [place] Certified Pharmacy Technician [place] Management Direct Dial:[phone]

## 2018-09-20 ENCOUNTER — Other Ambulatory Visit: Payer: Self-pay | Admitting: Pharmacy Technician

## 2018-09-20 NOTE — Patient Outreach (Signed)
[facility]) Care Management  09/20/2018  Joanna Sandoval [birth year] [id number]   Incoming voicemail stating the amount she receives from [facility] monthly. Updated patient assistance application and prepared it to be mailed to [employer] patient assistance. Patient stated that she was going to work and that she would be available to contact at 3 today or Monday.   -Will follow up with patient in 1-2 days to update -Will follow up with [employer] in 10-14 business days to check status of application  [employer]. Chana Bode [place] Certified Pharmacy Technician [place] Management Direct Dial:[phone]

## 2018-09-30 ENCOUNTER — Other Ambulatory Visit: Payer: Self-pay | Admitting: Pharmacy Technician

## 2018-09-30 NOTE — Patient Outreach (Signed)
Joanna Sandoval [facility]) Care Management  09/30/2018  Joanna Sandoval [birth year] [id number]    Follow up call placed to [employer] regarding patient assistance application(s) for Proventil HFA and [name] , Adonis Huguenin confirms application has been received. Attestation form mailed out to patient on 6/13.   Successful call placed to patient regarding patient assistance update for Proventil HFA and [name], HIPAA identifiers verified. Informed [name] that attestation was mailed out to her. Requested that she contact me when she has received it so that I can assist her with completing it. She stated she would.  Follow up:  Will follow up with patient in 5-7 business days if she has not contacted me.  Maud Deed Chana Bode [place] Certified Pharmacy Technician [place] Management Direct [phone]

## 2018-10-04 ENCOUNTER — Telehealth: Payer: Self-pay

## 2018-10-06 ENCOUNTER — Ambulatory Visit (HOSPITAL_COMMUNITY)
Admission: EM | Admit: 2018-10-06 | Discharge: 2018-10-06 | Disposition: A | Discharge status: Home / Self Care | Payer: PPO | Attending: Family Medicine | Admitting: Family Medicine

## 2018-10-06 ENCOUNTER — Encounter (HOSPITAL_COMMUNITY): Payer: Self-pay | Admitting: Family Medicine

## 2018-10-06 ENCOUNTER — Other Ambulatory Visit: Payer: Self-pay

## 2018-10-06 DIAGNOSIS — S40021A Contusion of right upper arm, initial encounter: Secondary | ICD-10-CM

## 2018-10-06 DIAGNOSIS — Z23 Encounter for immunization: Secondary | ICD-10-CM

## 2018-10-06 DIAGNOSIS — W540XXA Bitten by dog, initial encounter: Secondary | ICD-10-CM | POA: Diagnosis not present

## 2018-10-06 MED ORDER — TETANUS-DIPHTH-ACELL PERTUSSIS 5-2.5-18.5 LF-MCG/0.5 IM SUSP
INTRAMUSCULAR | Status: AC
  Filled 2018-10-06: qty 0.5

## 2018-10-06 MED ORDER — DOXYCYCLINE HYCLATE 100 MG PO TABS: 100.0000 mg | ORAL_TABLET | Freq: Two times a day (BID) | ORAL | 0 refills | Status: DC

## 2018-10-06 MED ORDER — DEXAMETHASONE 1 MG/ML PO CONC: 10.0000 mg | Freq: Once | ORAL | Status: DC

## 2018-10-06 MED ORDER — TETANUS-DIPHTH-ACELL PERTUSSIS 5-2.5-18.5 LF-MCG/0.5 IM SUSP
0.5000 mL | Freq: Once | INTRAMUSCULAR | Status: AC
  Administered 2018-10-06: 0.5 mL via INTRAMUSCULAR

## 2018-10-06 NOTE — ED Provider Notes (Addendum)
[place]    CSN: [id number] Arrival date & time: 10/06/18  1233     History   Chief Complaint Chief Complaint  Patient presents with  . Animal Bite    HPI Secret Kristensen is a 73 y.o. female.   Is a 73 year old established [place] urgent care patient who presents with a dog bite 2 days ago.  She initially reported that this dog was her neighbors and she didn't know of immunization status.  When she was told to go to the ED, she immediately insisted that she had seen the immunization record verifying rabies vaccine of the dog.  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      Past Medical History:  Diagnosis Date  . Arthritis   . Diabetes mellitus   . Hypercholesteremia   . Hypertension   . Post-menopausal bleeding     Patient Active Problem List   Diagnosis Date Noted  . Type 2 diabetes mellitus without complication, without long-term current use of insulin ([place]) 07/31/2018  . Essential hypertension 04/30/2018  . Uncontrolled type 2 diabetes mellitus with hyperglycemia ([place]) 04/30/2018  . Postmenopausal vaginal bleeding   . Fibroid 11/11/2011  . Postmenopausal bleeding 08/30/2011    Past Surgical History:  Procedure Laterality Date  . CATARACT EXTRACTION    . DILATION AND CURETTAGE OF UTERUS    . HYSTEROSCOPY W/D&C N/A 05/02/2017   Procedure: DILATATION AND CURETTAGE /HYSTEROSCOPY;  Surgeon: Mora Bellman, MD;  Location: [facility];  Service: Gynecology;  Laterality: N/A;  . KNEE ARTHROSCOPY      OB History    Gravida  4   Para  3   Term  3   Preterm      AB  1   Living  1     SAB  1   TAB      Ectopic      Multiple      Live Births               Home Medications    Prior to Admission medications   Medication Sig Start Date End Date Taking? Authorizing Provider  atenolol (TENORMIN) 100 MG tablet Take 50 mg by  mouth daily.    Yes [provider]  atorvastatin (LIPITOR) 40 MG tablet Take 1 tablet (40 mg total) by mouth daily. Take 1/2 pill at bedtime 05/10/18  Yes [name], FNP  D3-50 50000 units capsule  01/14/17  Yes [provider]  glimepiride (AMARYL) 2 MG tablet Take 1 1/2 tab daily 07/31/18  Yes [name], FNP  [name] 100 MG tablet TAKE 1 TABLET(100 MG) BY MOUTH DAILY 04/26/18  Yes Rodriguez-Southworth, Sunday Spillers, PA-C  losartan (COZAAR) 50 MG tablet Take 50 mg by mouth daily.   Yes [provider]  albuterol (PROVENTIL HFA;VENTOLIN HFA) 108 (90 Base) MCG/ACT inhaler Inhale 2 puffs into the lungs every 6 (six) hours as needed.    [provider]  aspirin EC 81 MG tablet Take 81 mg by mouth daily.    [provider]  doxycycline (VIBRA-TABS) 100 MG tablet Take 1 tablet (100 mg total) by mouth 2 (two) times daily. 10/06/18   Robyn Haber, MD    Family History Family History  Problem Relation Age of Onset  . Hypertension Mother   . Hypertension Father   . Cancer Father        ?  lung  . Diabetes Sister     Social History Social History   Tobacco Use  . Smoking status: Former Smoker    Types: Cigarettes  . Smokeless tobacco: Never Used  Substance Use Topics  . Alcohol use: No  . Drug use: No     Allergies   Patient has no known allergies.   Review of Systems Review of Systems  Skin: Positive for wound.  All other systems reviewed and are negative.    Physical Exam Triage Vital Signs ED Triage Vitals  Enc Vitals Group     BP      Pulse      Resp      Temp      Temp src      SpO2      Weight      Height      Head Circumference      Peak Flow      Pain Score      Pain Loc      Pain Edu?      Excl. in [place]?    No data found.  Updated Vital Signs BP (!) 128/52   Pulse 87   Temp 98.3 F (36.8 C) (Oral)   Resp 20   SpO2 98%    Physical Exam Vitals signs and nursing note reviewed.  HENT:     Head:  Normocephalic.  Eyes:     Conjunctiva/sclera: Conjunctivae normal.  Neck:     Musculoskeletal: Normal range of motion and neck supple.  Pulmonary:     Effort: Pulmonary effort is normal.  Musculoskeletal: Normal range of motion.        General: Swelling and signs of injury present.  Skin:    General: Skin is warm and dry.     Findings: Erythema present.  Neurological:     General: No focal deficit present.  Psychiatric:        Mood and Affect: Mood normal.          UC Treatments / Results  Labs (all labs ordered are listed, but only abnormal results are displayed) Labs Reviewed - No data to display  EKG None  Radiology No results found.  Procedures Procedures (including critical care time)  Medications Ordered in UC Medications  Tdap (BOOSTRIX) injection 0.5 mL (0.5 mLs Intramuscular Given 10/06/18 1315)  Tdap (BOOSTRIX) 5-2.5-18.5 LF-MCG/0.5 injection (has no administration in time range)    Initial Impression / Assessment and Plan / UC Course  I have reviewed the triage vital signs and the nursing notes.  Pertinent labs & imaging results that were available during my care of the patient were reviewed by me and considered in my medical decision making (see chart for details).    Final Clinical Impressions(s) / UC Diagnoses   Final diagnoses:  Dog bite, initial encounter  Contusion of right upper extremity, initial encounter     Discharge Instructions     Continue to gently wash the area three times a day with soap and water  Recheck the immunization status of the dog to make sure it has received its rabies shots.    ED Prescriptions    Medication Sig Dispense Auth. Provider   doxycycline (VIBRA-TABS) 100 MG tablet Take 1 tablet (100 mg total) by mouth 2 (two) times daily. 14 tablet Robyn Haber, MD     Controlled Substance Prescriptions [place] Controlled Substance Registry consulted? Not Applicable   Robyn Haber, MD 10/06/18 Biehle, Trevor Wilkie,  MD 10/06/18 1325

## 2018-10-06 NOTE — ED Triage Notes (Signed)
Pt reports dog bite at 1100 to right forearm.  Pt spoke with owner, who states dog is UTD on rabies vaccine, but patient initially stated she has no proof.  Pt informed of importance of having proof, otherwise she would need to go to ED for rabies series.  Discussed at length with patient.  Upon obtaining VS, pt clarifies that dog bite occurred at 1100 2 days ago, and is insistant that owner provided proof of UTD rabies vaccine in dog to her.  Right forearm swollen and slightly red with puncture scabs noted.  Denies fevers.

## 2018-10-06 NOTE — ED Notes (Signed)
[facility] report completed and faxed.

## 2018-10-06 NOTE — Discharge Instructions (Addendum)
Continue to gently wash the area three times a day with soap and water  Recheck the immunization status of the dog to make sure it has received its rabies shots.

## 2018-10-07 ENCOUNTER — Telehealth: Payer: Self-pay

## 2018-10-10 ENCOUNTER — Ambulatory Visit (INDEPENDENT_AMBULATORY_CARE_PROVIDER_SITE_OTHER): Payer: PPO | Admitting: Pharmacist

## 2018-10-10 DIAGNOSIS — E119 Type 2 diabetes mellitus without complications: Secondary | ICD-10-CM

## 2018-10-10 DIAGNOSIS — I10 Essential (primary) hypertension: Secondary | ICD-10-CM

## 2018-10-10 NOTE — Patient Instructions (Addendum)
Visit Information  Goals Addressed            This Visit's Progress     Patient Stated   . "I would like for my Diabetes to be better managed" (pt-stated)       Current Barriers:  [name] Knowledge Deficits related to Diabetes disease process and Self Health Management  Nurse Case Manager Clinical Goal(s):  [name] Over the next 30 days, patient will verbalize basic understanding of Diabetes disease process and self health management plan as evidenced by patient will verbalize understanding of importance to adhering to her diabetic meal plan, report taking her diabetic medications exactly as prescribed and will verbalize knowing when to call the CCM team and or provider if needed for hypo/hyperglycemic events.   Interventions:   Completed CCM PharmD Telephone Follow Up call with patient on 10/10/18 . Evaluation of current treatment plan related to diabetes management and patient's adherence to plan as established by provider.  Reports compliance with medications. . Patients has been provided patient parameters for diabetes home action plan for hypo/hyperglycemia and how to manage for BS<80 and or >250 by CCM RN CM . Patient denies hypoglycemia, reports BGs are a bit sporadic.  Patient says they have been up and down, but have not gone above 200.  Patient's A1c has been trending down (most recently 7.2 on 07/2018). Next A1c to be drawn at office visit on 10/30/18.  If A1c has increased, will discuss adjusting medications to achieve optimal control. . Patient is still working and continues to wear her mask in public areas; only shops in the early morning to stay safe  Educated patient on recommendations for carb amount for snacks and meals . Discussed plans with patient for ongoing care management follow up and provided patient with direct contact information for care management team . Provided RN CM contact # and hours of availability  Scheduled a CCM follow up call with patient for about 3  weeks  Patient Self Care Activities:  Joanna Sandoval understanding of the education and information provided today . Self administers medications as prescribed . Attends all scheduled provider appointments . Calls pharmacy for medication refills . Attends church or other social activities . Performs ADL's independently . Performs IADL's independently . Calls provider office for new concerns or questions  Please see past updates related to this goal by clicking on the "Past Updates" button in the selected goal       . "Some of my medications are expensive" (pt-stated)       Current Barriers:  [name] Knowledge Deficits related to resources to assist with cost of [name] & albuterol inhaler . Photographer & Pharmacist Clinical Goal(s):  [name] Over the next 30 days, patient will work with CCM team to apply for Patient assistance for [name] & albuterol.   Interventions:  . Completed comprehensive medication review . Evaluation of current treatment plan related to diabetes management and patient's adherence to plan as established by provider. . Reviewed medications with patient and discussed importance of taking diabetic medications exactly as prescribed w/o missed doses ([name] samples labeled for patient to pick up Monday 10/14/18 per [name], DNP, FNP until PAP process completed -->~2-3 weeks) . Updated med list on 10/10/18 --> patient was told discontinue ASA after colonoscopy, continue to take statin  . Patient awaiting attestation letter from [employer] patient assistance company.  Instructed patient to call once she has received the letter . Discussed plans with patient for  ongoing care management follow up and provided patient with direct contact information for care management team . Provided RN CM & PharmD contact # and hours of availability  Scheduled a CCM follow up call with patient for 2 weeks  Patient Self Care Activities:   Verbalizes understanding of  the education and information provided today . Self administers medications as prescribed . Attends all scheduled provider appointments . Calls pharmacy for medication refills . Attends church or other social activities . Performs ADL's independently . Performs IADL's independently . Calls provider office for new concerns or questions  Please see past updates related to this goal by clicking on the "Past Updates" button in the selected goal          The patient verbalized understanding of instructions provided today and declined a print copy of patient instruction materials.   The care management team will reach out to the patient again over the next 14 days.   Regina Eck, PharmD, BCPS Clinical Pharmacist, [facility] [place]: [phone]

## 2018-10-10 NOTE — Progress Notes (Signed)
Chronic Care Management   Visit Note  10/10/2018 Name: Joanna Sandoval MRN: [id number] DOB: 1946/04/12  Referred by: [name], FNP Reason for referral : Chronic Care Management   Joanna Sandoval is a 73 y.o. year old female who is a primary care patient of [name], [place]. The CCM team was consulted for assistance with chronic disease management and care coordination needs.   Review of patient status, including review of consultants reports, relevant laboratory and other test results, and collaboration with appropriate care team members and the patient's provider was performed as part of comprehensive patient evaluation and provision of chronic care management services.    I spoke with Joanna Sandoval by telephone today.  Objective:   Goals Addressed            This Visit's Progress     Patient Stated   . "I would like for my Diabetes to be better managed" (pt-stated)       Current Barriers:  [name] Knowledge Deficits related to Diabetes disease process and Self Health Management  Nurse Case Manager Clinical Goal(s):  [name] Over the next 30 days, patient will verbalize basic understanding of Diabetes disease process and self health management plan as evidenced by patient will verbalize understanding of importance to adhering to her diabetic meal plan, report taking her diabetic medications exactly as prescribed and will verbalize knowing when to call the CCM team and or provider if needed for hypo/hyperglycemic events.   Interventions:   Completed CCM PharmD Telephone Follow Up call with patient on 10/10/18 . Evaluation of current treatment plan related to diabetes management and patient's adherence to plan as established by provider.  Reports compliance with medications. . Patients has been provided patient parameters for diabetes home action plan for hypo/hyperglycemia and how to manage for BS<80 and or >250 by CCM RN CM . Patient denies hypoglycemia, reports BGs are a bit  sporadic.  Patient says they have been up and down, but have not gone above 200.  Patient's A1c has been trending down (most recently 7.2 on 07/2018). Next A1c to be drawn at office visit on 10/30/18.  If A1c has increased, will discuss adjusting medications to achieve optimal control. . Patient is still working and continues to wear her mask in public areas; only shops in the early morning to stay safe  Educated patient on recommendations for carb amount for snacks and meals . Discussed plans with patient for ongoing care management follow up and provided patient with direct contact information for care management team . Provided RN CM contact # and hours of availability  Scheduled a CCM follow up call with patient for about 3 weeks  Patient Self Care Activities:  Joanna Sandoval understanding of the education and information provided today . Self administers medications as prescribed . Attends all scheduled provider appointments . Calls pharmacy for medication refills . Attends church or other social activities . Performs ADL's independently . Performs IADL's independently . Calls provider office for new concerns or questions  Please see past updates related to this goal by clicking on the "Past Updates" button in the selected goal       . "Some of my medications are expensive" (pt-stated)       Current Barriers:  [name] Knowledge Deficits related to resources to assist with cost of [name] & albuterol inhaler . Photographer & Pharmacist Clinical Goal(s):  [name] Over the next 30 days, patient will work with CCM team to apply  for Patient assistance for [name] & albuterol.   Interventions:  . Completed comprehensive medication review . Evaluation of current treatment plan related to diabetes management and patient's adherence to plan as established by provider. . Reviewed medications with patient and discussed importance of taking diabetic medications exactly as  prescribed w/o missed doses ([name] samples labeled for patient to pick up Monday 10/14/18 per [name], DNP, FNP until PAP process completed -->~2-3 weeks) . Updated med list on 10/10/18 --> patient was told discontinue ASA after colonoscopy, continue to take statin  . Patient awaiting attestation letter from [employer] patient assistance company.  Instructed patient to call once she has received the letter . Discussed plans with patient for ongoing care management follow up and provided patient with direct contact information for care management team . Provided RN CM & PharmD contact # and hours of availability  Scheduled a CCM follow up call with patient for 2 weeks  Patient Self Care Activities:   Verbalizes understanding of the education and information provided today . Self administers medications as prescribed . Attends all scheduled provider appointments . Calls pharmacy for medication refills . Attends church or other social activities . Performs ADL's independently . Performs IADL's independently . Calls provider office for new concerns or questions  Please see past updates related to this goal by clicking on the "Past Updates" button in the selected goal           Plan:   The care management team will reach out to the patient again over the next 2 weeks.  Regina Eck, PharmD, BCPS Clinical Pharmacist, [facility] [place]: [phone]

## 2018-10-11 ENCOUNTER — Telehealth: Payer: Self-pay

## 2018-10-14 ENCOUNTER — Ambulatory Visit: Payer: Self-pay | Admitting: Pharmacist

## 2018-10-14 DIAGNOSIS — I10 Essential (primary) hypertension: Secondary | ICD-10-CM

## 2018-10-14 DIAGNOSIS — E119 Type 2 diabetes mellitus without complications: Secondary | ICD-10-CM

## 2018-10-14 NOTE — Patient Instructions (Signed)
Visit Information  Goals Addressed            This Visit's Progress     Patient Stated   . "I would like for my Diabetes to be better managed" (pt-stated)       Current Barriers:  [name] Knowledge Deficits related to Diabetes disease process and Self Health Management  Nurse Case Manager & PharmD Clinical Goal(s):  [name] Over the next 90 days, patient will verbalize basic understanding of Diabetes disease process and self health management plan as evidenced by patient will verbalize understanding of importance to adhering to her diabetic meal plan, report taking her diabetic medications exactly as prescribed and will verbalize knowing when to call the CCM team and or provider if needed for hypo/hyperglycemic events.   Interventions:   Completed CCM PharmD Telephone Follow Up call with patient on 10/14/18 . Evaluation of current treatment plan related to diabetes management and patient's adherence to plan as established by provider.  Reports compliance with medications. . Patients has been provided patient parameters for diabetes home action plan for hypo/hyperglycemia and how to manage for BS<80 and or >250 . Patient denies hypoglycemia, reports BGs are a bit sporadic.  Patient says they have been up and down, but have not gone above 200.  Patient's A1c has been trending down (most recently 7.2 on 07/2018). Next A1c to be drawn at office visit on 10/30/18.  If A1c has increased, will discuss adjusting medications to achieve optimal control. . Patient is still working and continues to wear her mask in public areas; only shops in the early morning to stay safe  Educated patient on recommendations for carb amount for snacks and meals . Discussed plans with patient for ongoing care management follow up and provided patient with direct contact information for care management team . Provided CCM contact # and hours of availability  Scheduled a CCM follow up call with patient for about 3 weeks  Patient  Self Care Activities:  Joanna Sandoval understanding of the education and information provided today . Self administers medications as prescribed . Attends all scheduled provider appointments . Calls pharmacy for medication refills . Attends church or other social activities . Performs ADL's independently . Performs IADL's independently . Calls provider office for new concerns or questions  Please see past updates related to this goal by clicking on the "Past Updates" button in the selected goal       . "Some of my medications are expensive" (pt-stated)       Current Barriers:  [name] Knowledge Deficits related to resources to assist with cost of [name] & albuterol inhaler . Photographer & Pharmacist Clinical Goal(s):  [name] Over the next 30 days, patient will work with CCM team to apply for Patient assistance for [name] & albuterol.   Interventions:  . Completed comprehensive medication review . Evaluation of current treatment plan related to diabetes management and patient's adherence to plan as established by provider. . Reviewed medications with patient and discussed importance of taking diabetic medications exactly as prescribed w/o missed doses (Patient picked up [name] samples this morning to last her until PAP process completed -->~2-3 weeks) . Updated med list on 10/10/18 --> patient was told discontinue ASA after colonoscopy . Patient awaiting attestation letter from [employer] patient assistance company.  Instructed patient to call once she has received the letter . Discussed plans with patient for ongoing care management follow up and provided patient with direct contact information for  care management team . Provided RN CM & PharmD contact # and hours of availability  Scheduled a CCM follow up call with patient for 2 weeks  Patient Self Care Activities:   Verbalizes understanding of the education and information provided today . Self administers  medications as prescribed . Attends all scheduled provider appointments . Calls pharmacy for medication refills . Attends church or other social activities . Performs ADL's independently . Performs IADL's independently . Calls provider office for new concerns or questions  Please see past updates related to this goal by clicking on the "Past Updates" button in the selected goal          The patient verbalized understanding of instructions provided today and declined a print copy of patient instruction materials.   The care management team will reach out to the patient again over the next 3 weeks.  Regina Eck, PharmD, BCPS Clinical Pharmacist, [facility] [place]: [phone]

## 2018-10-14 NOTE — Progress Notes (Signed)
Chronic Care Management   Visit Note  10/14/2018 Name: Joanna Sandoval MRN: [id number] DOB: 1946-03-17  Referred by: [name], FNP Reason for referral : Chronic Care Management   Joanna Sandoval is a 73 y.o. year old female who is a primary care patient of [name], [place]. The CCM team was consulted for assistance with chronic disease management and care coordination needs.   Review of patient status, including review of consultants reports, relevant laboratory and other test results, and collaboration with appropriate care team members and the patient's provider was performed as part of comprehensive patient evaluation and provision of chronic care management services.    I spoke with Joanna Sandoval by telephone today.  Objective:   Goals Addressed            This Visit's Progress     Patient Stated   . "I would like for my Diabetes to be better managed" (pt-stated)       Current Barriers:  [name] Knowledge Deficits related to Diabetes disease process and Self Health Management  Nurse Case Manager & PharmD Clinical Goal(s):  [name] Over the next 90 days, patient will verbalize basic understanding of Diabetes disease process and self health management plan as evidenced by patient will verbalize understanding of importance to adhering to her diabetic meal plan, report taking her diabetic medications exactly as prescribed and will verbalize knowing when to call the CCM team and or provider if needed for hypo/hyperglycemic events.   Interventions:   Completed CCM PharmD Telephone Follow Up call with patient on 10/14/18 . Evaluation of current treatment plan related to diabetes management and patient's adherence to plan as established by provider.  Reports compliance with medications. . Patients has been provided patient parameters for diabetes home action plan for hypo/hyperglycemia and how to manage for BS<80 and or >250 . Patient denies hypoglycemia, reports BGs are a bit sporadic.   Patient says they have been up and down, but have not gone above 200.  Patient's A1c has been trending down (most recently 7.2 on 07/2018). Next A1c to be drawn at office visit on 10/30/18.  If A1c has increased, will discuss adjusting medications to achieve optimal control. . Patient is still working and continues to wear her mask in public areas; only shops in the early morning to stay safe  Educated patient on recommendations for carb amount for snacks and meals . Discussed plans with patient for ongoing care management follow up and provided patient with direct contact information for care management team . Provided CCM contact # and hours of availability  Scheduled a CCM follow up call with patient for about 3 weeks  Patient Self Care Activities:  Joanna Sandoval understanding of the education and information provided today . Self administers medications as prescribed . Attends all scheduled provider appointments . Calls pharmacy for medication refills . Attends church or other social activities . Performs ADL's independently . Performs IADL's independently . Calls provider office for new concerns or questions  Please see past updates related to this goal by clicking on the "Past Updates" button in the selected goal       . "Some of my medications are expensive" (pt-stated)       Current Barriers:  [name] Knowledge Deficits related to resources to assist with cost of [name] & albuterol inhaler . Photographer & Pharmacist Clinical Goal(s):  [name] Over the next 30 days, patient will work with CCM team to apply for Patient assistance  for [name] & albuterol.   Interventions:  . Completed comprehensive medication review . Evaluation of current treatment plan related to diabetes management and patient's adherence to plan as established by provider. . Reviewed medications with patient and discussed importance of taking diabetic medications exactly as prescribed w/o  missed doses (Patient picked up [name] samples this morning to last her until PAP process completed -->~2-3 weeks) . Updated med list on 10/10/18 --> patient was told discontinue ASA after colonoscopy . Patient awaiting attestation letter from [employer] patient assistance company.  Instructed patient to call once she has received the letter . Discussed plans with patient for ongoing care management follow up and provided patient with direct contact information for care management team . Provided RN CM & PharmD contact # and hours of availability  Scheduled a CCM follow up call with patient for 3 weeks  Patient Self Care Activities:   Verbalizes understanding of the education and information provided today . Self administers medications as prescribed . Attends all scheduled provider appointments . Calls pharmacy for medication refills . Attends church or other social activities . Performs ADL's independently . Performs IADL's independently . Calls provider office for new concerns or questions  Please see past updates related to this goal by clicking on the "Past Updates" button in the selected goal           Plan:   The care management team will reach out to the patient again over the next 3 weeks.  Regina Eck, PharmD, BCPS Clinical Pharmacist, [facility] [place]: [phone]

## 2018-10-17 ENCOUNTER — Encounter: Payer: Self-pay | Admitting: Pharmacy Technician

## 2018-10-22 ENCOUNTER — Telehealth: Payer: Self-pay

## 2018-10-23 ENCOUNTER — Other Ambulatory Visit: Payer: Self-pay | Admitting: Pharmacy Technician

## 2018-10-23 ENCOUNTER — Telehealth: Payer: Self-pay | Admitting: Pharmacist

## 2018-10-23 NOTE — Patient Outreach (Signed)
[facility]) Care Management  10/23/2018  Joanna Sandoval February 22, 1946 [id number]   Received signed attestation form frm providers office for patients [name] and Proventil HFA. Faxed (1 time exception made by [employer]) into [employer] with Attn: Izora Gala.  Will follow up with company in 2-3 business days to check status of application.  Maud Deed Chana Bode [place] Certified Pharmacy Technician [place] Management Direct [phone]

## 2018-10-28 ENCOUNTER — Other Ambulatory Visit: Payer: Self-pay | Admitting: Pharmacy Technician

## 2018-10-28 NOTE — Patient Outreach (Signed)
[facility]) Care Management  10/28/2018  Joanna Sandoval Apr 27, 1945 [id number]    Follow up call placed to [employer] regarding patient assistance application(s) for Proventil HFA and [name] , Joanna Sandoval confirms patient has been approved as of 7/9 until 04/17/19. Medication to arrive at patients home in 10-14 business days.  Follow up:  Will route note to [place] to inform.  Joanna Sandoval [name] Certified Pharmacy Technician [place] Management Direct Dial:[phone]

## 2018-10-30 ENCOUNTER — Encounter: Payer: Self-pay | Admitting: Nurse Practitioner

## 2018-10-30 ENCOUNTER — Ambulatory Visit (INDEPENDENT_AMBULATORY_CARE_PROVIDER_SITE_OTHER): Payer: PPO | Admitting: Nurse Practitioner

## 2018-10-30 ENCOUNTER — Other Ambulatory Visit: Payer: Self-pay

## 2018-10-30 VITALS — BP 116/80 | HR 77 | Temp 98.0°F | Ht 62.4 in | Wt 204.0 lb

## 2018-10-30 DIAGNOSIS — I10 Essential (primary) hypertension: Secondary | ICD-10-CM | POA: Diagnosis not present

## 2018-10-30 DIAGNOSIS — M546 Pain in thoracic spine: Secondary | ICD-10-CM | POA: Diagnosis not present

## 2018-10-30 DIAGNOSIS — E119 Type 2 diabetes mellitus without complications: Secondary | ICD-10-CM

## 2018-10-30 DIAGNOSIS — Z23 Encounter for immunization: Secondary | ICD-10-CM | POA: Diagnosis not present

## 2018-10-30 MED ORDER — DICLOFENAC SODIUM 1 % TD GEL: 2.0000 g | Freq: Four times a day (QID) | TRANSDERMAL | 1 refills | Status: DC

## 2018-10-30 NOTE — Progress Notes (Signed)
Subjective:     Patient ID: Joanna Sandoval , female    DOB: [birth year] , 73 y.o.   MRN: [id number]   Chief Complaint  Patient presents with  . Diabetes  . Hypertension  . Immunizations    pnuemonia     HPI  Admits to not being as active due to COVID-19  Wt Readings from Last 3 Encounters: 10/30/18 : 204 lb (92.5 kg) 07/31/18 : 206 lb 9.6 oz (93.7 kg) 06/19/18 : 207 lb 6.4 oz (94.1 kg)   Diabetes She presents for her follow-up diabetic visit. She has type 2 diabetes mellitus. Her disease course has been stable. There are no hypoglycemic associated symptoms. Pertinent negatives for hypoglycemia include no dizziness or headaches. There are no diabetic associated symptoms. There are no hypoglycemic complications. There are no diabetic complications. Risk factors for coronary artery disease include obesity and sedentary lifestyle. Current diabetic treatment includes oral agent (dual therapy). When asked about meal planning, she reported none. She rarely participates in exercise. There is no change in her home blood glucose trend. (Highest blood sugar was 180.  ) An ACE inhibitor/angiotensin II receptor blocker is being taken. She does not see a podiatrist.Eye exam is not current.  Hypertension This is a chronic problem. The current episode started more than 1 year ago. The problem is unchanged. The problem is controlled. Pertinent negatives include no headaches. There are no associated agents to hypertension. Risk factors for coronary artery disease include obesity, sedentary lifestyle, dyslipidemia and diabetes mellitus. There are no compliance problems.  There is no history of chronic renal disease.     Past Medical History:  Diagnosis Date  . Arthritis   . Diabetes mellitus   . Hypercholesteremia   . Hypertension   . Post-menopausal bleeding      Family History  Problem Relation Age of Onset  . Hypertension Mother   . Hypertension Father   . Cancer Father        ?lung  .  Diabetes Sister      Current Outpatient Medications:  .  albuterol (PROVENTIL HFA;VENTOLIN HFA) 108 (90 Base) MCG/ACT inhaler, Inhale 2 puffs into the lungs every 6 (six) hours as needed., Disp: , Rfl:  .  atenolol (TENORMIN) 100 MG tablet, Take 50 mg by mouth daily. , Disp: , Rfl:  .  atorvastatin (LIPITOR) 40 MG tablet, Take 1 tablet (40 mg total) by mouth daily. Take 1/2 pill at bedtime, Disp: 90 tablet, Rfl: 1 .  D3-50 50000 units capsule, , Disp: , Rfl: 0 .  glimepiride (AMARYL) 2 MG tablet, Take 1 1/2 tab daily, Disp: 135 tablet, Rfl: 1 .  [name] 100 MG tablet, TAKE 1 TABLET(100 MG) BY MOUTH DAILY, Disp: 90 tablet, Rfl: 1 .  losartan (COZAAR) 50 MG tablet, Take 50 mg by mouth daily., Disp: , Rfl:    No Known Allergies   Review of Systems  Respiratory: Negative.   Cardiovascular: Negative.   Musculoskeletal: Negative.   Neurological: Negative for dizziness and headaches.     Today's Vitals   10/30/18 0848  BP: 116/80  Pulse: 77  Temp: 98 F (36.7 C)  TempSrc: Oral  Weight: 204 lb (92.5 kg)  Height: 5' 2.4" (1.585 m)   Body mass index is 36.84 kg/m.   Objective:  Physical Exam Vitals signs reviewed.  Constitutional:      Appearance: Normal appearance.  Cardiovascular:     Rate and Rhythm: Normal rate and regular rhythm.     Pulses: Normal  pulses.     Heart sounds: Normal heart sounds. No murmur.  Pulmonary:     Effort: Pulmonary effort is normal.     Breath sounds: Normal breath sounds.  Musculoskeletal:        General: Tenderness (right posterior back at thoracic area) present.  Skin:    General: Skin is warm and dry.     Capillary Refill: Capillary refill takes less than 2 seconds.  Neurological:     General: No focal deficit present.     Mental Status: She is alert and oriented to person, place, and time.  Psychiatric:        Mood and Affect: Mood normal.        Behavior: Behavior normal.        Thought Content: Thought content normal.         Judgment: Judgment normal.         Assessment And Plan:     1. Type 2 diabetes mellitus without complication, without long-term current use of insulin (HCC)  Chronic, poorly controlled.    Continue with current medications pending results of HgbA1c  Encouraged to limit intake of sugary foods and drinks  Encouraged to increase physical activity to 150 minutes per week - Hemoglobin A1c - CMP14 + Anion Gap - Lipid Profile  2. Essential hypertension . B/P under good controll  . CMP ordered to check renal function.  . The importance of regular exercise and dietary modification was stressed to the patient.  . Stressed importance of losing ten percent of her body weight to help with B/P control.  . The weight loss would help with decreasing cardiac and cancer risk as well.   3. Encounter for immunization  prevnar 13 immunization order sent to pharmacy  4. Acute right-sided thoracic back pain  Mild tenderness with palpation  Advised to be mindful when pulling and lifting with proper ergronomics - diclofenac sodium (VOLTAREN) 1 % GEL; Apply 2 g topically 4 (four) times daily.  Dispense: 100 g; Refill: 1   [name], FNP    THE PATIENT IS ENCOURAGED TO PRACTICE SOCIAL DISTANCING DUE TO THE COVID-19 PANDEMIC.

## 2018-10-31 LAB — LIPID PANEL
Chol/HDL Ratio: 3.1 ratio (ref 0.0–4.4)
Cholesterol, Total: 151 mg/dL (ref 100–199)
HDL: 48 mg/dL (ref >39)
LDL Calculated: 80 mg/dL (ref 0–99)
Triglycerides: 113 mg/dL (ref 0–149)
VLDL Cholesterol Cal: 23 mg/dL (ref 5–40)

## 2018-10-31 LAB — CMP14 + ANION GAP
ALT: 7 IU/L (ref 0–32)
AST: 12 IU/L (ref 0–40)
Albumin/Globulin Ratio: 1.4 (ref 1.2–2.2)
Albumin: 4.2 g/dL (ref 3.7–4.7)
Alkaline Phosphatase: 117 IU/L (ref 39–117)
Anion Gap: 16 mmol/L (ref 10.0–18.0)
BUN/Creatinine Ratio: 23 (ref 12–28)
BUN: 20 mg/dL (ref 8–27)
Bilirubin Total: 0.6 mg/dL (ref 0.0–1.2)
CO2: 26 mmol/L (ref 20–29)
Calcium: 9.9 mg/dL (ref 8.7–10.3)
Chloride: 102 mmol/L (ref 96–106)
Creatinine, Ser: 0.87 mg/dL (ref 0.57–1.00)
GFR calc Af Amer: 78 mL/min/{1.73_m2} (ref >59)
GFR calc non Af Amer: 67 mL/min/{1.73_m2} (ref >59)
Globulin, Total: 2.9 g/dL (ref 1.5–4.5)
Glucose: 215 mg/dL — ABNORMAL HIGH (ref 65–99)
Potassium: 4.6 mmol/L (ref 3.5–5.2)
Sodium: 144 mmol/L (ref 134–144)
Total Protein: 7.1 g/dL (ref 6.0–8.5)

## 2018-10-31 LAB — HEPATITIS C ANTIBODY: Hep C Virus Ab: <0.1 s/co ratio (ref 0.0–0.9)

## 2018-10-31 LAB — HEMOGLOBIN A1C
Est. average glucose Bld gHb Est-mCnc: 163 mg/dL
Hgb A1c MFr Bld: 7.3 % — ABNORMAL HIGH (ref 4.8–5.6)

## 2018-11-01 ENCOUNTER — Telehealth: Payer: Self-pay

## 2018-11-04 ENCOUNTER — Telehealth: Payer: Self-pay

## 2018-11-07 ENCOUNTER — Telehealth: Payer: Self-pay

## 2018-11-08 ENCOUNTER — Telehealth: Payer: Self-pay

## 2018-11-10 MED ORDER — PNEUMOCOCCAL 13-VAL CONJ VACC IM SUSP: 0.5000 mL | INTRAMUSCULAR | 0 refills | Status: AC

## 2018-11-11 ENCOUNTER — Ambulatory Visit (INDEPENDENT_AMBULATORY_CARE_PROVIDER_SITE_OTHER): Payer: PPO | Admitting: Pharmacist

## 2018-11-11 ENCOUNTER — Other Ambulatory Visit: Payer: Self-pay | Admitting: Pharmacy Technician

## 2018-11-11 DIAGNOSIS — E119 Type 2 diabetes mellitus without complications: Secondary | ICD-10-CM

## 2018-11-11 DIAGNOSIS — I10 Essential (primary) hypertension: Secondary | ICD-10-CM | POA: Diagnosis not present

## 2018-11-11 NOTE — Patient Outreach (Signed)
[facility]) Care Management  11/11/2018  Joanna Sandoval July 16, 1945 [id number]    Incoming call from patient regarding patient assistance medication delivery of [name] and Proventil HFA, HIPAA identifiers verified. Joanna Sandoval contacted me to let me know she received her medications from [place]. Joanna Sandoval expressed gratitude for [place] helping her get help with her medications.  Follow up:  Will route note to [facility] RPh [name] for case closure  Maud Deed. Chana Bode [place] Certified Pharmacy Technician [place] Management Direct Dial:[phone]

## 2018-11-12 NOTE — Progress Notes (Signed)
Chronic Care Management   Visit Note  11/11/2018 Name: Joanna Sandoval MRN: [id number] DOB: 03/14/1946  Referred by: [name], FNP Reason for referral : Chronic Care Management   Joanna Sandoval is a 73 y.o. year old female who is a primary care patient of [name], [place]. The CCM team was consulted for assistance with chronic disease management and care coordination needs.   Review of patient status, including review of consultants reports, relevant laboratory and other test results, and collaboration with appropriate care team members and the patient's provider was performed as part of comprehensive patient evaluation and provision of chronic care management services.    I spoke with Joanna Sandoval by telephone today.  Objective:   Goals Addressed            This Visit's Progress     Patient Stated   . "I would like for my Diabetes to be better managed" (pt-stated)       Current Barriers:  [name] Knowledge Deficits related to Diabetes disease process and Self Health Management  Nurse Case Manager & PharmD Clinical Goal(s):  [name] Over the next 90 days, patient will verbalize basic understanding of Diabetes disease process and self health management plan as evidenced by patient will verbalize understanding of importance to adhering to her diabetic meal plan, report taking her diabetic medications exactly as prescribed and will verbalize knowing when to call the CCM team and or provider if needed for hypo/hyperglycemic events.   Interventions:  Completed CCM PharmD Telephone Follow Up call with patient on 11/11/18 . Evaluation of current treatment plan related to diabetes management and patient's adherence to plan as established by provider.  Reports compliance with medications.  Comprehensive medication review performed.  NO changes to medication regimen at this time. . Patients has been provided parameters for diabetes home action plan for hypo/hyperglycemia and how to manage for  BS<80 and or >250 . Patient denies hypoglycemia, reports BGs are a bit sporadic.  Patient states her FBG ranges from 150-180.  Patient believes that she will be able to better control her BGs because she is able to take her medication as prescribed.  She states her BGs have not gone above 200.  Patient's A1c has been trending down (most recently 7.3 on 10/30/18).  PCP discussed adding GLP1 once weekly injectable, however patient is hesitant about using injectable.  She states she will work to increase her daily activity level and adhere to a diabetes friendly diet.  Encouraged patient to stick with drinking water and avoid sugary drinks.  Patient verbalizes understanding.  Will continue to discuss adjusting medications to achieve optimal control. . Patient is still working part-time and continues to wear her mask in public areas; only shops in the early morning to stay safe  Educated patient on recommendations for carb amount for snacks and meals . Discussed plans with patient for ongoing care management follow up and provided patient with direct contact information for care management team . Provided CCM contact # and hours of availability  Scheduled a CCM follow up call with patient next month   Patient Self Care Activities:  Joanna Sandoval understanding of the education and information provided today . Self administers medications as prescribed . Attends all scheduled provider appointments . Calls pharmacy for medication refills . Attends church or other social activities . Performs ADL's independently . Performs IADL's independently . Calls provider office for new concerns or questions  Please see past updates related to this goal by clicking on  the "Past Updates" button in the selected goal       . COMPLETED: "Some of my medications are expensive" (pt-stated)       Current Barriers:  [name] Knowledge Deficits related to resources to assist with cost of [name] & albuterol inhaler . Garment/textile technologist & Pharmacist Clinical Goal(s):  [name] Over the next 30 days, patient will work with CCM team to apply for Patient assistance for [name] & albuterol. completed  Interventions:  . Completed comprehensive medication review . Evaluation of current treatment plan related to diabetes management and patient's adherence to plan as established by provider. . Reviewed medications with patient and discussed importance of taking diabetic medications exactly as prescribed w/o missed doses  . Updated med list on 11/11/18 --> per previous report, patient was told discontinue ASA after colonoscopy last year . Patient received [name] and inhalers in the mail.  Instructed patient how to reorder.  Goal completed . Discussed plans with patient for ongoing care management follow up and provided patient with direct contact information for care management team . Provided RN CM & PharmD contact # and hours of availability  Scheduled a CCM follow up call with patient for next month  Patient Self Care Activities:   Verbalizes understanding of the education and information provided today . Self administers medications as prescribed . Attends all scheduled provider appointments . Calls pharmacy for medication refills . Attends church or other social activities . Performs ADL's independently . Performs IADL's independently . Calls provider office for new concerns or questions  Please see past updates related to this goal by clicking on the "Past Updates" button in the selected goal           Plan:   The care management team will reach out to the patient again over the next month.  Regina Eck, PharmD, BCPS Clinical Pharmacist, [facility] [place]: [phone]

## 2018-11-12 NOTE — Patient Instructions (Signed)
Visit Information  Goals Addressed            This Visit's Progress     Patient Stated   . "I would like for my Diabetes to be better managed" (pt-stated)       Current Barriers:  [name] Knowledge Deficits related to Diabetes disease process and Self Health Management  Nurse Case Manager & PharmD Clinical Goal(s):  [name] Over the next 90 days, patient will verbalize basic understanding of Diabetes disease process and self health management plan as evidenced by patient will verbalize understanding of importance to adhering to her diabetic meal plan, report taking her diabetic medications exactly as prescribed and will verbalize knowing when to call the CCM team and or provider if needed for hypo/hyperglycemic events.   Interventions:  Completed CCM PharmD Telephone Follow Up call with patient on 11/11/18 . Evaluation of current treatment plan related to diabetes management and patient's adherence to plan as established by provider.  Reports compliance with medications.  Comprehensive medication review performed.  NO changes to medication regimen at this time. . Patients has been provided parameters for diabetes home action plan for hypo/hyperglycemia and how to manage for BS<80 and or >250 . Patient denies hypoglycemia, reports BGs are a bit sporadic.  Patient states her FBG ranges from 150-180.  Patient believes that she will be able to better control her BGs because she is able to take her medication as prescribed.  She states her BGs have not gone above 200.  Patient's A1c has been trending down (most recently 7.3 on 10/30/18).  PCP discussed adding GLP1 once weekly injectable, however patient is hesitant about using injectable.  She states she will work to increase her daily activity level and adhere to a diabetes friendly diet.  Encouraged patient to stick with drinking water and avoid sugary drinks.  Patient verbalizes understanding.  Will continue to discuss adjusting medications to achieve optimal  control. . Patient is still working part-time and continues to wear her mask in public areas; only shops in the early morning to stay safe  Educated patient on recommendations for carb amount for snacks and meals . Discussed plans with patient for ongoing care management follow up and provided patient with direct contact information for care management team . Provided CCM contact # and hours of availability  Scheduled a CCM follow up call with patient next month   Patient Self Care Activities:  [name] understanding of the education and information provided today . Self administers medications as prescribed . Attends all scheduled provider appointments . Calls pharmacy for medication refills . Attends church or other social activities . Performs ADL's independently . Performs IADL's independently . Calls provider office for new concerns or questions  Please see past updates related to this goal by clicking on the "Past Updates" button in the selected goal       . COMPLETED: "Some of my medications are expensive" (pt-stated)       Current Barriers:  [name] Knowledge Deficits related to resources to assist with cost of [name] & albuterol inhaler . Photographer & Pharmacist Clinical Goal(s):  [name] Over the next 30 days, patient will work with CCM team to apply for Patient assistance for [name] & albuterol. completed  Interventions:  . Completed comprehensive medication review . Evaluation of current treatment plan related to diabetes management and patient's adherence to plan as established by provider. . Reviewed medications with patient and discussed importance of taking diabetic  medications exactly as prescribed w/o missed doses  . Updated med list on 11/11/18 --> per previous report, patient was told discontinue ASA after colonoscopy last year . Patient received [name] and inhalers in the mail.  Instructed patient how to reorder.  Goal  completed . Discussed plans with patient for ongoing care management follow up and provided patient with direct contact information for care management team . Provided RN CM & PharmD contact # and hours of availability  Scheduled a CCM follow up call with patient for next month  Patient Self Care Activities:   Verbalizes understanding of the education and information provided today . Self administers medications as prescribed . Attends all scheduled provider appointments . Calls pharmacy for medication refills . Attends church or other social activities . Performs ADL's independently . Performs IADL's independently . Calls provider office for new concerns or questions  Please see past updates related to this goal by clicking on the "Past Updates" button in the selected goal          The patient verbalized understanding of instructions provided today and declined a print copy of patient instruction materials.   The care management team will reach out to the patient again over the next month.  Regina Eck, PharmD, BCPS Clinical Pharmacist, [facility] [place]: [phone]

## 2018-11-19 ENCOUNTER — Telehealth: Payer: Self-pay

## 2018-11-25 DIAGNOSIS — E119 Type 2 diabetes mellitus without complications: Secondary | ICD-10-CM | POA: Diagnosis not present

## 2018-11-25 DIAGNOSIS — M1990 Unspecified osteoarthritis, unspecified site: Secondary | ICD-10-CM | POA: Diagnosis not present

## 2018-11-25 DIAGNOSIS — I10 Essential (primary) hypertension: Secondary | ICD-10-CM | POA: Diagnosis not present

## 2018-11-25 DIAGNOSIS — E785 Hyperlipidemia, unspecified: Secondary | ICD-10-CM | POA: Diagnosis not present

## 2018-11-25 DIAGNOSIS — E559 Vitamin D deficiency, unspecified: Secondary | ICD-10-CM | POA: Diagnosis not present

## 2018-12-05 ENCOUNTER — Telehealth: Payer: Self-pay

## 2018-12-05 NOTE — Telephone Encounter (Signed)
Patient called requesting that we send labs over to [facility].  I HAVE FAXED OVER HER LABS ON 12/05/18. PT MADE AWARE. Lonia Mad

## 2018-12-11 DIAGNOSIS — H02831 Dermatochalasis of right upper eyelid: Secondary | ICD-10-CM | POA: Diagnosis not present

## 2018-12-11 DIAGNOSIS — H16223 Keratoconjunctivitis sicca, not specified as Sjogren's, bilateral: Secondary | ICD-10-CM | POA: Diagnosis not present

## 2018-12-11 DIAGNOSIS — H18413 Arcus senilis, bilateral: Secondary | ICD-10-CM | POA: Diagnosis not present

## 2018-12-11 DIAGNOSIS — Q141 Congenital malformation of retina: Secondary | ICD-10-CM | POA: Diagnosis not present

## 2018-12-11 DIAGNOSIS — H02834 Dermatochalasis of left upper eyelid: Secondary | ICD-10-CM | POA: Diagnosis not present

## 2018-12-11 DIAGNOSIS — H401131 Primary open-angle glaucoma, bilateral, mild stage: Secondary | ICD-10-CM | POA: Diagnosis not present

## 2018-12-11 DIAGNOSIS — H11153 Pinguecula, bilateral: Secondary | ICD-10-CM | POA: Diagnosis not present

## 2018-12-11 DIAGNOSIS — E119 Type 2 diabetes mellitus without complications: Secondary | ICD-10-CM | POA: Diagnosis not present

## 2018-12-11 LAB — HM DIABETES EYE EXAM

## 2018-12-12 ENCOUNTER — Telehealth: Payer: Self-pay

## 2018-12-18 ENCOUNTER — Telehealth: Payer: Self-pay

## 2018-12-18 ENCOUNTER — Ambulatory Visit (INDEPENDENT_AMBULATORY_CARE_PROVIDER_SITE_OTHER): Payer: PPO

## 2018-12-18 ENCOUNTER — Other Ambulatory Visit: Payer: Self-pay

## 2018-12-18 DIAGNOSIS — E119 Type 2 diabetes mellitus without complications: Secondary | ICD-10-CM

## 2018-12-18 DIAGNOSIS — I10 Essential (primary) hypertension: Secondary | ICD-10-CM | POA: Diagnosis not present

## 2018-12-20 NOTE — Patient Instructions (Addendum)
Visit Information  Goals Addressed      Patient Stated   . "I would like for my Diabetes to be better managed" (pt-stated)       Current Barriers:  [name] Knowledge Deficits related to Diabetes disease process and Self Health Management  Nurse Case Manager & PharmD Clinical Goal(s):  [name] Over the next 90 days, patient will verbalize basic understanding of Diabetes disease process and self health management plan as evidenced by patient will verbalize understanding of importance to adhering to her diabetic meal plan, report taking her diabetic medications exactly as prescribed and will verbalize knowing when to call the CCM team and or provider if needed for hypo/hyperglycemic events.   CCM RN CM Interventions:  12/20/18 call completed with patient   . Evaluation of current treatment plan related to diabetes management and patient's adherence to plan as established by provider.  Reports compliance with medications.  Comprehensive medication review performed and discussed recent change to patient's Glimepiride 2 mg tablet increased to 1 1/2 tabs per day by Dr. Kathrin Greathouse. Patient does not wish to start an insulin therapy at this time . Reviewed parameters for diabetes home action plan for hypo/hyperglycemia and how to manage for BS<80 and or >250; discussed patient's recent BG are running in the 120's; discussed patient is making a huge effort to more strictly follow her ADA diet and has implemented daily walking; positive reinforcement given to patient to keep up the good work! . Reviewed and discussed patient's most recent A1C of 7.3 obtained on 10/30/18; discussed her target A1C is to get back down to <7.0 . Discussed plans with patient for ongoing care management follow up and provided patient with direct contact information for care management team . Mailed printed Diabetic educational materials related to Diabetic Meal Planning, signs/symptoms of Hypo/Hyperglycemia, Diabetes Zone Safety Tool   Patient  Self Care Activities:  [name] understanding of the education and information provided today . Self administers medications as prescribed . Attends all scheduled provider appointments . Calls pharmacy for medication refills . Attends church or other social activities . Performs ADL's independently . Performs IADL's independently . Calls provider office for new concerns or questions  Please see past updates related to this goal by clicking on the "Past Updates" button in the selected goal       . "The pressure behind my eyes is elevated" (pt-stated)       Current Barriers:  [name] Knowledge Deficits related to diagnosis and treatment management for bilateral Intraocular pressure . Diabetes Mellitus type 2  Nurse Case Manager Clinical Goal(s):  [name] Over the next 30 days, patient will work with the Eye Specialist to address needs related to bilateral intraocular pressure  CCM RN CM Interventions:  12/18/18 call completed with patient  . Evaluation of current treatment plan related to increased intraocular pressure and patient's adherence to plan as established by provider. . Provided education to patient re: importance of adhering to recommendations for treatment management for increased intraocular pressure and MD follow up; discussed that poor glycemic control can contribute to IOP's  . Reviewed medications with patient and discussed Eye Specialist prescribed medicated eye drops to be placed in both eyes twice daily (name unknown); discussed patient will follow up with her Eye Specialist on 11/24/18  . Discussed plans with patient for ongoing care management follow up and provided patient with direct contact information for care management team . Provided patient with printed educational materials related to Diabetic Eye Diseases  Patient  Self Care Activities:  . Self administers medications as prescribed . Attends all scheduled provider appointments . Calls pharmacy for medication  refills . Performs ADL's independently . Performs IADL's independently . Calls provider office for new concerns or questions  Initial goal documentation        The patient verbalized understanding of instructions provided today and declined a print copy of patient instruction materials.   Telephone follow up appointment with care management team member scheduled for: 01/29/19  [name], RN, BSN, CCM Care Management Coordinator [facility]  Direct Phone: [phone]

## 2018-12-20 NOTE — Chronic Care Management (AMB) (Signed)
Chronic Care Management   Follow Up Note   12/18/2018 Name: Joanna Sandoval MRN: [id number] DOB: 05/19/1945  Referred by: [name], FNP Reason for referral : Chronic Care Management (CCM RN CM Telephone Follow up )   Joanna Sandoval is a 73 y.o. year old female who is a primary care patient of [name], [place]. The CCM team was consulted for assistance with chronic disease management and care coordination needs.    Review of patient status, including review of consultants reports, relevant laboratory and other test results, and collaboration with appropriate care team members and the patient's provider was performed as part of comprehensive patient evaluation and provision of chronic care management services.    SDOH (Social Determinants of Health) screening performed today: None. See Care Plan for related entries.   I spoke with Joanna Sandoval by telephone today to f/u on her Diabetes.   Outpatient Encounter Medications as of 12/18/2018  Medication Sig  . albuterol (PROVENTIL HFA;VENTOLIN HFA) 108 (90 Base) MCG/ACT inhaler Inhale 2 puffs into the lungs every 6 (six) hours as needed.  Joanna Sandoval atenolol (TENORMIN) 100 MG tablet Take 50 mg by mouth daily.   Joanna Sandoval atorvastatin (LIPITOR) 40 MG tablet Take 1 tablet (40 mg total) by mouth daily. Take 1/2 pill at bedtime  . D3-50 50000 units capsule   . diclofenac sodium (VOLTAREN) 1 % GEL Apply 2 g topically 4 (four) times daily.  Joanna Sandoval glimepiride (AMARYL) 2 MG tablet Take 1 1/2 tab daily  . [name] 100 MG tablet TAKE 1 TABLET(100 MG) BY MOUTH DAILY  . losartan (COZAAR) 50 MG tablet Take 50 mg by mouth daily.   No facility-administered encounter medications on file as of 12/18/2018.      Goals Addressed      Patient Stated   . "I would like for my Diabetes to be better managed" (pt-stated)       Current Barriers:  Joanna Sandoval Knowledge Deficits related to Diabetes disease process and Self Health Management  Nurse Case Manager & PharmD Clinical Goal(s):  Joanna Sandoval  Over the next 90 days, patient will verbalize basic understanding of Diabetes disease process and self health management plan as evidenced by patient will verbalize understanding of importance to adhering to her diabetic meal plan, report taking her diabetic medications exactly as prescribed and will verbalize knowing when to call the CCM team and or provider if needed for hypo/hyperglycemic events.   CCM RN CM Interventions:  12/20/18 call completed with patient   . Evaluation of current treatment plan related to diabetes management and patient's adherence to plan as established by provider.  Reports compliance with medications.  Comprehensive medication review performed and discussed recent change to patient's Glimepiride 2 mg tablet increased to 1 1/2 tabs per day by Dr. Kathrin Greathouse. Patient does not wish to start an insulin therapy at this time . Reviewed parameters for diabetes home action plan for hypo/hyperglycemia and how to manage for BS<80 and or >250; discussed patient's recent BG are running in the 120's; discussed patient is making a huge effort to more strictly follow her ADA diet and has implemented daily walking; positive reinforcement given to patient to keep up the good work! . Reviewed and discussed patient's most recent A1C of 7.3 obtained on 10/30/18; discussed her target A1C is to get back down to <7.0 . Discussed plans with patient for ongoing care management follow up and provided patient with direct contact information for care management team . Mailed printed Diabetic educational materials related to  Diabetic Meal Planning, signs/symptoms of Hypo/Hyperglycemia, Diabetes Zone Safety Tool   Patient Self Care Activities:  [name] understanding of the education and information provided today . Self administers medications as prescribed . Attends all scheduled provider appointments . Calls pharmacy for medication refills . Attends church or other social activities . Performs  ADL's independently . Performs IADL's independently . Calls provider office for new concerns or questions  Please see past updates related to this goal by clicking on the "Past Updates" button in the selected goal       . "The pressure behind my eyes is elevated" (pt-stated)       Current Barriers:  Joanna Sandoval Knowledge Deficits related to diagnosis and treatment management for bilateral Intraocular pressure . Diabetes Mellitus type 2  Nurse Case Manager Clinical Goal(s):  Joanna Sandoval Over the next 30 days, patient will work with the Eye Specialist to address needs related to bilateral intraocular pressure  CCM RN CM Interventions:  12/18/18 call completed with patient  . Evaluation of current treatment plan related to increased intraocular pressure and patient's adherence to plan as established by provider. . Provided education to patient re: importance of adhering to recommendations for treatment management for increased intraocular pressure and MD follow up; discussed that poor glycemic control can contribute to IOP's  . Reviewed medications with patient and discussed Eye Specialist prescribed medicated eye drops to be placed in both eyes twice daily (name unknown); discussed patient will follow up with her Eye Specialist on 11/24/18  . Discussed plans with patient for ongoing care management follow up and provided patient with direct contact information for care management team . Provided patient with printed educational materials related to Diabetic Eye Diseases  Patient Self Care Activities:  . Self administers medications as prescribed . Attends all scheduled provider appointments . Calls pharmacy for medication refills . Performs ADL's independently . Performs IADL's independently . Calls provider office for new concerns or questions  Initial goal documentation         Telephone follow up appointment with care management team member scheduled for: 01/29/19   [name], RN, BSN, CCM  Care Management Coordinator [facility]  Direct Phone: [phone]

## 2018-12-30 ENCOUNTER — Telehealth: Payer: Self-pay

## 2018-12-31 DIAGNOSIS — H11153 Pinguecula, bilateral: Secondary | ICD-10-CM | POA: Diagnosis not present

## 2018-12-31 DIAGNOSIS — H02831 Dermatochalasis of right upper eyelid: Secondary | ICD-10-CM | POA: Diagnosis not present

## 2018-12-31 DIAGNOSIS — Q141 Congenital malformation of retina: Secondary | ICD-10-CM | POA: Diagnosis not present

## 2018-12-31 DIAGNOSIS — H401131 Primary open-angle glaucoma, bilateral, mild stage: Secondary | ICD-10-CM | POA: Diagnosis not present

## 2018-12-31 DIAGNOSIS — Z961 Presence of intraocular lens: Secondary | ICD-10-CM | POA: Diagnosis not present

## 2018-12-31 DIAGNOSIS — E119 Type 2 diabetes mellitus without complications: Secondary | ICD-10-CM | POA: Diagnosis not present

## 2018-12-31 DIAGNOSIS — H16223 Keratoconjunctivitis sicca, not specified as Sjogren's, bilateral: Secondary | ICD-10-CM | POA: Diagnosis not present

## 2018-12-31 DIAGNOSIS — H02834 Dermatochalasis of left upper eyelid: Secondary | ICD-10-CM | POA: Diagnosis not present

## 2018-12-31 DIAGNOSIS — H18413 Arcus senilis, bilateral: Secondary | ICD-10-CM | POA: Diagnosis not present

## 2019-01-23 ENCOUNTER — Telehealth: Payer: Self-pay

## 2019-01-24 ENCOUNTER — Ambulatory Visit (INDEPENDENT_AMBULATORY_CARE_PROVIDER_SITE_OTHER): Payer: PPO | Admitting: Pharmacist

## 2019-01-24 DIAGNOSIS — I10 Essential (primary) hypertension: Secondary | ICD-10-CM

## 2019-01-24 DIAGNOSIS — E119 Type 2 diabetes mellitus without complications: Secondary | ICD-10-CM | POA: Diagnosis not present

## 2019-01-24 NOTE — Progress Notes (Signed)
Chronic Care Management   Visit Note  01/24/2019 Name: Joanna Sandoval MRN: [id number] DOB: 09-15-1945  Referred by: Joanna Brine, FNP Reason for referral : Chronic Care Management   Joanna Sandoval is a 73 y.o. year old female who is a primary care patient of Joanna Sandoval, [place]. The CCM team was consulted for assistance with chronic disease management and care coordination needs related to HTN DMII  Review of patient status, including review of consultants reports, relevant laboratory and other test results, and collaboration with appropriate care team members and the patient's provider was performed as part of comprehensive patient evaluation and provision of chronic care management services.    I spoke with [name] by telephone today.  Advanced Directives Status: N See Care Plan and Vynca application for related entries.   Medications: Outpatient Encounter Medications as of 01/24/2019  Medication Sig  . albuterol (PROVENTIL HFA;VENTOLIN HFA) 108 (90 Base) MCG/ACT inhaler Inhale 2 puffs into the lungs every 6 (six) hours as needed.  [name] atenolol (TENORMIN) 100 MG tablet Take 50 mg by mouth daily.   [name] atorvastatin (LIPITOR) 40 MG tablet Take 1 tablet (40 mg total) by mouth daily. Take 1/2 pill at bedtime  . D3-50 50000 units capsule   . diclofenac sodium (VOLTAREN) 1 % GEL Apply 2 g topically 4 (four) times daily.  [name] glimepiride (AMARYL) 2 MG tablet Take 1 1/2 tab daily  . [name] 100 MG tablet TAKE 1 TABLET(100 MG) BY MOUTH DAILY  . losartan (COZAAR) 50 MG tablet Take 50 mg by mouth daily.   No facility-administered encounter medications on file as of 01/24/2019.      Objective:   Goals Addressed            This Visit's Progress     Patient Stated   . "I would like for my Diabetes to be better managed" (pt-stated)         Current Barriers:   Knowledge Deficits related to Diabetes disease process and Self Health Management   Nurse Case Manager & PharmD Clinical  Goal(s):   Over the next 90 days, patient will verbalize basic understanding of Diabetes disease process and self health management plan as evidenced by patient will verbalize understanding of importance to adhering to her diabetic meal plan, report taking her diabetic medications exactly as prescribed and will verbalize knowing when to call the CCM team and or provider if needed for hypo/hyperglycemic events.    CCM PharmD Interventions:  Completed CCM PharmD Telephone Follow Up call with patient on 01/24/19  Evaluation of current treatment plan related to diabetes management and patient's adherence to plan as established by provider.  Reports compliance with medications.  Comprehensive medication review performed.  NO changes to medication regimen at this time.  Patient denies hypoglycemia, reports BGs are better controlled.  She has been able to take medication as prescribed due to patient assistance.  She has also been doing better with diet/exercise.  She continues to work part time.  Patient states her FBG ranges from 110-130.  Patient's A1c has been trending down (most recently 7.3 on 10/30/18).  She is due for recheck A1c this month.  Anticipate lower A1c based on BG readings.  Encouraged patient to stick with drinking water and avoid sugary drinks.  Patient verbalizes understanding.  Will continue to discuss adjusting medications to achieve optimal control.  Patient is still working part-time and continues to wear her mask in public areas; only shops in the early morning to stay  safe  Educated patient on recommendations for carb amount for snacks and meals  Discussed plans with patient for ongoing     Patient Self Care Activities:  Joanna Sandoval understanding of the education and information provided today . Self administers medications as prescribed . Attends all scheduled provider appointments . Calls pharmacy for medication refills . Attends church or other social activities .  Performs ADL's independently . Performs IADL's independently . Calls provider office for new concerns or questions  Please see past updates related to this goal by clicking on the "Past Updates" button in the selected goal          Plan:   The care management team will reach out to the patient again over the next 6-8 weeks.  Joanna Sandoval, PharmD, BCPS Clinical Pharmacist, [facility] [place]: [phone]

## 2019-01-27 NOTE — Patient Instructions (Signed)
Visit Information  Goals Addressed            This Visit's Progress     Patient Stated   . "I would like for my Diabetes to be better managed" (pt-stated)       Current Barriers:  [name] Knowledge Deficits related to Diabetes disease process and Self Health Management  Nurse Case Manager & PharmD Clinical Goal(s):  [name] Over the next 90 days, patient will verbalize basic understanding of Diabetes disease process and self health management plan as evidenced by patient will verbalize understanding of importance to adhering to her diabetic meal plan, report taking her diabetic medications exactly as prescribed and will verbalize knowing when to call the CCM team and or provider if needed for hypo/hyperglycemic events.   CCM RN CM Interventions:  12/20/18 call completed with patient   . Evaluation of current treatment plan related to diabetes management and patient's adherence to plan as established by provider.  Reports compliance with medications.  Comprehensive medication review performed and discussed recent change to patient's Glimepiride 2 mg tablet increased to 1 1/2 tabs per day by Dr. Kathrin Greathouse. Patient does not wish to start an insulin therapy at this time . Reviewed parameters for diabetes home action plan for hypo/hyperglycemia and how to manage for BS<80 and or >250; discussed patient's recent BG are running in the 120's; discussed patient is making a huge effort to more strictly follow her ADA diet and has implemented daily walking; positive reinforcement given to patient to keep up the good work! . Reviewed and discussed patient's most recent A1C of 7.3 obtained on 10/30/18; discussed her target A1C is to get back down to <7.0 . Discussed plans with patient for ongoing care management follow up and provided patient with direct contact information for care management team . Mailed printed Diabetic educational materials related to Diabetic Meal Planning, signs/symptoms of Hypo/Hyperglycemia,  Diabetes Zone Safety Tool   Current Barriers:   Knowledge Deficits related to Diabetes disease process and Self Health Management   Nurse Case Manager & PharmD Clinical Goal(s):   Over the next 90 days, patient will verbalize basic understanding of Diabetes disease process and self health management plan as evidenced by patient will verbalize understanding of importance to adhering to her diabetic meal plan, report taking her diabetic medications exactly as prescribed and will verbalize knowing when to call the CCM team and or provider if needed for hypo/hyperglycemic events.    CCM PharmD Interventions:  Completed CCM PharmD Telephone Follow Up call with patient on 01/24/19  Evaluation of current treatment plan related to diabetes management and patient's adherence to plan as established by provider.  Reports compliance with medications.  Comprehensive medication review performed.  NO changes to medication regimen at this time.  Patient denies hypoglycemia, reports BGs are ,much better controlled.  She has been able to take medication as prescribed due to patient assistance.  She has also been doing better with diet/exercise.  She continues to work part time.  Patient states her FBG ranges from 110-130.  Patient's A1c has been trending down (most recently 7.3 on 10/30/18).  She is due for recheck A1c this month.  Anticipate lower A1c based on BG readings.  Encouraged patient to stick with drinking water and avoid sugary drinks.  Patient verbalizes understanding.  Will continue to discuss adjusting medications to achieve optimal control.  Patient is still working part-time and continues to wear her mask in public areas; only shops in the early morning to  stay safe  Educated patient on recommendations for carb amount for snacks and meals  Discussed plans with patient for ongoing     Patient Self Care Activities:  Joanna Sandoval understanding of the education and information provided  today . Self administers medications as prescribed . Attends all scheduled provider appointments . Calls pharmacy for medication refills . Attends church or other social activities . Performs ADL's independently . Performs IADL's independently . Calls provider office for new concerns or questions  Please see past updates related to this goal by clicking on the "Past Updates" button in the selected goal          The patient verbalized understanding of instructions provided today and declined a print copy of patient instruction materials.   The care management team will reach out to the patient again over the next 6-8 weeks  Joanna Sandoval, PharmD, [place] Pharmacist, [place]: [phone]

## 2019-01-28 ENCOUNTER — Other Ambulatory Visit: Payer: Self-pay | Admitting: Nurse Practitioner

## 2019-01-28 DIAGNOSIS — E119 Type 2 diabetes mellitus without complications: Secondary | ICD-10-CM

## 2019-01-29 ENCOUNTER — Telehealth: Payer: Self-pay

## 2019-01-30 ENCOUNTER — Other Ambulatory Visit: Payer: Self-pay

## 2019-01-30 ENCOUNTER — Ambulatory Visit (INDEPENDENT_AMBULATORY_CARE_PROVIDER_SITE_OTHER): Payer: PPO | Admitting: Nurse Practitioner

## 2019-01-30 ENCOUNTER — Encounter: Payer: Self-pay | Admitting: Nurse Practitioner

## 2019-01-30 VITALS — BP 124/60 | HR 76 | Temp 98.3°F | Ht 62.4 in | Wt 210.8 lb

## 2019-01-30 DIAGNOSIS — R0781 Pleurodynia: Secondary | ICD-10-CM

## 2019-01-30 DIAGNOSIS — E119 Type 2 diabetes mellitus without complications: Secondary | ICD-10-CM | POA: Diagnosis not present

## 2019-01-30 DIAGNOSIS — W182XXA Fall in (into) shower or empty bathtub, initial encounter: Secondary | ICD-10-CM

## 2019-01-30 DIAGNOSIS — W19XXXA Unspecified fall, initial encounter: Secondary | ICD-10-CM

## 2019-01-30 DIAGNOSIS — I10 Essential (primary) hypertension: Secondary | ICD-10-CM

## 2019-01-30 NOTE — Patient Instructions (Signed)

## 2019-01-30 NOTE — Progress Notes (Signed)
Subjective:     Patient ID: Joanna Sandoval , female    DOB: 02-11-1946 , 73 y.o.   MRN: [id number]   Chief Complaint  Patient presents with  . Diabetes    HPI  She continues to work 5 days a week part time hours.     Diabetes She presents for her follow-up diabetic visit. She has type 2 diabetes mellitus. Her disease course has been stable. There are no hypoglycemic associated symptoms. Pertinent negatives for hypoglycemia include no dizziness or headaches. There are no diabetic associated symptoms. There are no hypoglycemic complications. There are no diabetic complications. Risk factors for coronary artery disease include obesity and sedentary lifestyle. Current diabetic treatment includes oral agent (dual therapy). She is compliant with treatment all of the time. When asked about meal planning, she reported none. She rarely participates in exercise. There is no change in her home blood glucose trend. (Range of blood sugar 104-214, average around 112.  Highest blood sugar was 214 once in Sept) An ACE inhibitor/angiotensin II receptor blocker is being taken. She does not see a podiatrist.Eye exam is not current.  Hypertension This is a chronic problem. The current episode started more than 1 year ago. The problem is unchanged. The problem is controlled. Pertinent negatives include no headaches. There are no associated agents to hypertension. Risk factors for coronary artery disease include obesity, sedentary lifestyle, dyslipidemia and diabetes mellitus. Past treatments include angiotensin blockers. There are no compliance problems.  There is no history of chronic renal disease.  Fall The accident occurred more than 1 week ago (On Sunday). Fall occurred: She fell while getting out of the shower. Point of impact: left side of body. The patient is experiencing no pain. Pertinent negatives include no headaches. She has tried nothing for the symptoms. The treatment provided no relief (She is  feeling better today).     Past Medical History:  Diagnosis Date  . Arthritis   . Diabetes mellitus   . Hypercholesteremia   . Hypertension   . Post-menopausal bleeding      Family History  Problem Relation Age of Onset  . Hypertension Mother   . Hypertension Father   . Cancer Father        ?lung  . Diabetes Sister      Current Outpatient Medications:  .  albuterol (PROVENTIL HFA;VENTOLIN HFA) 108 (90 Base) MCG/ACT inhaler, Inhale 2 puffs into the lungs every 6 (six) hours as needed., Disp: , Rfl:  .  atenolol (TENORMIN) 100 MG tablet, Take 50 mg by mouth daily. , Disp: , Rfl:  .  atorvastatin (LIPITOR) 40 MG tablet, Take 1 tablet (40 mg total) by mouth daily. Take 1/2 pill at bedtime, Disp: 90 tablet, Rfl: 1 .  D3-50 50000 units capsule, , Disp: , Rfl: 0 .  diclofenac sodium (VOLTAREN) 1 % GEL, Apply 2 g topically 4 (four) times daily., Disp: 100 g, Rfl: 1 .  glimepiride (AMARYL) 2 MG tablet, TAKE 1 AND 1/2 TABLETS BY MOUTH DAILY, Disp: 135 tablet, Rfl: 1 .  [name] 100 MG tablet, TAKE 1 TABLET(100 MG) BY MOUTH DAILY, Disp: 90 tablet, Rfl: 1 .  losartan (COZAAR) 50 MG tablet, Take 50 mg by mouth daily., Disp: , Rfl:    No Known Allergies   Review of Systems  Constitutional: Negative.   Respiratory: Negative.   Cardiovascular: Negative.   Musculoskeletal: Negative.   Neurological: Negative for dizziness and headaches.  Psychiatric/Behavioral: Negative.      Today's Vitals  01/30/19 0843  BP: 124/60  Pulse: 76  Temp: 98.3 F (36.8 C)  TempSrc: Oral  Weight: 210 lb 12.8 oz (95.6 kg)  Height: 5' 2.4" (1.585 m)  PainSc: 0-No pain   Body mass index is 38.06 kg/m.   Objective:  Physical Exam Vitals signs reviewed.  Constitutional:      Appearance: Normal appearance.  Cardiovascular:     Rate and Rhythm: Normal rate and regular rhythm.     Pulses: Normal pulses.     Heart sounds: Normal heart sounds. No murmur.  Pulmonary:     Effort: Pulmonary effort is  normal. No respiratory distress.     Breath sounds: Normal breath sounds.  Musculoskeletal:        General: No tenderness (left lateral 10/11 th rib).  Skin:    General: Skin is warm and dry.     Capillary Refill: Capillary refill takes less than 2 seconds.  Neurological:     General: No focal deficit present.     Mental Status: She is alert and oriented to person, place, and time.  Psychiatric:        Mood and Affect: Mood normal.        Behavior: Behavior normal.        Thought Content: Thought content normal.        Judgment: Judgment normal.         Assessment And Plan:     1. Type 2 diabetes mellitus without complication, without long-term current use of insulin (HCC)  Chronic, fairly controlled.    Continue with current medications pending results of HgbA1c  Encouraged to limit intake of sugary foods and drinks  Encouraged to increase physical activity to 150 minutes per week as tolerated  Diabetic foot exam done with education on avoiding walking barefoot and regular foot checks, moisturizer - Hemoglobin A1c - CMP14 + Anion Gap - Lipid Profile  2. Essential hypertension . B/P under good controll  . CMP ordered to check renal function.  . The importance of regular exercise and dietary modification was stressed to the patient.  . Stressed importance of losing ten percent of her body weight to help with B/P control.  . The weight loss would help with decreasing cardiac and cancer risk as well.   3. Fall, initial encounter  Left lateral pain to 10/11 rib, she denies shortness of breath  She can use the pain cream she has at home.   If symptoms worsen return call to office may need an xray  She declines having PT visit to check for safety or teaching with fall prevention  [name], FNP    THE PATIENT IS ENCOURAGED TO PRACTICE SOCIAL DISTANCING DUE TO THE COVID-19 PANDEMIC.

## 2019-01-31 LAB — CBC
Hematocrit: 36.8 % (ref 34.0–46.6)
Hemoglobin: 11.8 g/dL (ref 11.1–15.9)
MCH: 28 pg (ref 26.6–33.0)
MCHC: 32.1 g/dL (ref 31.5–35.7)
MCV: 87 fL (ref 79–97)
Platelets: 225 10*3/uL (ref 150–450)
RBC: 4.22 x10E6/uL (ref 3.77–5.28)
RDW: 17.8 % — ABNORMAL HIGH (ref 11.7–15.4)
WBC: 10.5 10*3/uL (ref 3.4–10.8)

## 2019-01-31 LAB — LIPID PANEL
Chol/HDL Ratio: 3 ratio (ref 0.0–4.4)
Cholesterol, Total: 149 mg/dL (ref 100–199)
HDL: 50 mg/dL (ref >39)
LDL Chol Calc (NIH): 78 mg/dL (ref 0–99)
Triglycerides: 117 mg/dL (ref 0–149)
VLDL Cholesterol Cal: 21 mg/dL (ref 5–40)

## 2019-01-31 LAB — HEMOGLOBIN A1C
Est. average glucose Bld gHb Est-mCnc: 166 mg/dL
Hgb A1c MFr Bld: 7.4 % — ABNORMAL HIGH (ref 4.8–5.6)

## 2019-02-18 ENCOUNTER — Ambulatory Visit: Payer: Self-pay

## 2019-02-18 DIAGNOSIS — I10 Essential (primary) hypertension: Secondary | ICD-10-CM

## 2019-02-18 DIAGNOSIS — E119 Type 2 diabetes mellitus without complications: Secondary | ICD-10-CM

## 2019-02-18 NOTE — Chronic Care Management (AMB) (Signed)
Chronic Care Management   Follow Up Note   02/18/2019 Name: Laaibah Wartman MRN: [id number] DOB: Jan 29, 1946  Referred by: [name], FNP Reason for referral : Chronic Care Management (CCM RNCM Telephone Outreach )   Sandra Brents is a 73 y.o. year old female who is a primary care patient of [name], [place]. The CCM team was consulted for assistance with chronic disease management and care coordination needs.    Review of patient status, including review of consultants reports, relevant laboratory and other test results, and collaboration with appropriate care team members and the patient's provider was performed as part of comprehensive patient evaluation and provision of chronic care management services.    SDOH (Social Determinants of Health) screening performed today: Stress. See Care Plan for related entries.   Advanced Directives Status: N See Care Plan and Vynca application for related entries.  Inbound call received from [name] today to discuss she is having some anxiety and depression due to her son tested positive for COVID-19 and has become very ill.   Outpatient Encounter Medications as of 02/18/2019  Medication Sig  . albuterol (PROVENTIL HFA;VENTOLIN HFA) 108 (90 Base) MCG/ACT inhaler Inhale 2 puffs into the lungs every 6 (six) hours as needed.  [name] atenolol (TENORMIN) 100 MG tablet Take 50 mg by mouth daily.   [name] atorvastatin (LIPITOR) 40 MG tablet Take 1 tablet (40 mg total) by mouth daily. Take 1/2 pill at bedtime  . D3-50 50000 units capsule   . diclofenac sodium (VOLTAREN) 1 % GEL Apply 2 g topically 4 (four) times daily.  [name] glimepiride (AMARYL) 2 MG tablet TAKE 1 AND 1/2 TABLETS BY MOUTH DAILY  . [name] 100 MG tablet TAKE 1 TABLET(100 MG) BY MOUTH DAILY  . losartan (COZAAR) 50 MG tablet Take 50 mg by mouth daily.   No facility-administered encounter medications on file as of 02/18/2019.      Goals Addressed      Patient Stated   . "I am depressed  and worried because my son has Covid" (pt-stated)       Current Barriers:  . Stress and Anxiety related to COVID-19  Nurse Case Manager Clinical Goal(s):  [name] Over the next 30 days, patient will work with the CCM team to address needs related to stress and anxiety over her son having positive COVID-19  CCM RN CM Interventions:  02/17/19 Inbound call from patient  . Discussed Ms. Zapanta is having some depression and anxiety over the fact that her son who is 19 yrs old and drive a truck for a living has tested positive for [place]; Discussed she is concerned that his cough/congestion has worsened and he was sent home from the [facility] with medications to self administer and was not admitted, he is not a patient at the [facility] practice . Provided active listening to [name] and validated her feelings of concern . Confirmed Ms. Mcneff has not been exposed to her knowledge . Provided emotional support and reassurance to [name] and answered questions related to COVID-19 . Discussed Ms. Tortora feels better since speaking with me and will keep the CCM team updated on the outcome of this situation  . Encouraged patient to call the CCM team promptly if her signs/symptoms of depression and anxiety worsen and she is agreeable  . Collaborated with the embedded Pharm D and BSW via in basket regarding [name] call today . Discussed plans with patient for ongoing care management follow up and provided patient  with direct contact information for care management team  Patient Self Care Activities:  . Self administers medications as prescribed . Attends all scheduled provider appointments . Calls pharmacy for medication refills . Attends church or other social activities . Performs ADL's independently . Performs IADL's independently . Calls provider office for new concerns or questions  Initial goal documentation     . "I would like for my Diabetes to be better managed"  (pt-stated)       Current Barriers:  [name] Knowledge Deficits related to Diabetes disease process and Self Health Management  Nurse Case Manager & PharmD Clinical Goal(s):  [name] Over the next 90 days, patient will verbalize basic understanding of Diabetes disease process and self health management plan as evidenced by patient will verbalize understanding of importance to adhering to her diabetic meal plan, report taking her diabetic medications exactly as prescribed and will verbalize knowing when to call the CCM team and or provider if needed for hypo/hyperglycemic events. Goal Met . New - 02/17/19 Over the next 90 days, patient will work with the [place] team for ongoing education on [place] management interventions to help lower her A1C <7.4  CCM RN CM Interventions:  02/18/19 Inbound call received from patient  . Evaluation of current treatment plan related to Diabetes and patient's adherence to plan as established by provider. . Provided education to patient re: most recent A1C 7.4; discussed target A1C <7.0; reiterated the importance of following diabetic diet with low carbs and to avoid sugary drinks and deserts . Reviewed medications with patient and discussed patient is adhering to taking all medications exactly as prescribed . Discussed plans with patient for ongoing care management follow up and provided patient with direct contact information for care management team . Provided patient with printed educational materials related to Diabetes management, Carb Counting, Carb Choices, Life's Simple 7 . Advised patient, providing education and rationale, to check cbg daily before meals and record, calling the CCM team and or PCP for findings outside established parameters.  <80 and or >250  Patient Self Care Activities:  . Self administers medications as prescribed . Attends all scheduled provider appointments . Calls pharmacy for medication refills . Attends church or other social activities .  Performs ADL's independently . Performs IADL's independently . Calls provider office for new concerns or questions  Please see past updates related to this goal by clicking on the "Past Updates" button in the selected goal         Telephone follow up appointment with care management team member scheduled for: 02/20/19  [name], RN, BSN, CCM Care Management Coordinator [place] [facility]  Direct Phone: [phone]

## 2019-02-18 NOTE — Patient Instructions (Signed)
Visit Information  Goals Addressed      Patient Stated   . "I am depressed and worried because my son has Covid" (pt-stated)       Current Barriers:  . Stress and Anxiety related to COVID-19  Nurse Case Manager Clinical Goal(s):  [name] Over the next 30 days, patient will work with the CCM team to address needs related to stress and anxiety over her son having positive COVID-19  CCM RN CM Interventions:  02/17/19 Inbound call from patient  . Discussed Ms. Kirkwood is having some depression and anxiety over the fact that her son who is 67 yrs old and drive a truck for a living has tested positive for [place]; Discussed she is concerned that his cough/congestion has worsened and he was sent home from the [facility] with medications to self administer and was not admitted, he is not a patient at the [facility] practice . Provided active listening to [name] and validated her feelings of concern . Confirmed Ms. Sinyard has not been exposed to her knowledge . Provided emotional support and reassurance to [name] and answered questions related to COVID-19 . Discussed Ms. Cathy feels better since speaking with me and will keep the CCM team updated on the outcome of this situation  . Encouraged patient to call the CCM team promptly if her signs/symptoms of depression and anxiety worsen and she is agreeable  . Collaborated with the embedded Pharm D and BSW via in basket regarding [name] call today . Discussed plans with patient for ongoing care management follow up and provided patient with direct contact information for care management team  Patient Self Care Activities:  . Self administers medications as prescribed . Attends all scheduled provider appointments . Calls pharmacy for medication refills . Attends church or other social activities . Performs ADL's independently . Performs IADL's independently . Calls provider office for new concerns or questions  Initial goal  documentation     . "I would like for my Diabetes to be better managed" (pt-stated)       Current Barriers:  [name] Knowledge Deficits related to Diabetes disease process and Self Health Management  Nurse Case Manager & PharmD Clinical Goal(s):  [name] Over the next 90 days, patient will verbalize basic understanding of Diabetes disease process and self health management plan as evidenced by patient will verbalize understanding of importance to adhering to her diabetic meal plan, report taking her diabetic medications exactly as prescribed and will verbalize knowing when to call the CCM team and or provider if needed for hypo/hyperglycemic events. Goal Met . New - 02/17/19 Over the next 90 days, patient will work with the [place] team for ongoing education on [place] management interventions to help lower her A1C <7.4  CCM RN CM Interventions:  02/18/19 Inbound call received from patient  . Evaluation of current treatment plan related to Diabetes and patient's adherence to plan as established by provider. . Provided education to patient re: most recent A1C 7.4; discussed target A1C <7.0; reiterated the importance of following diabetic diet with low carbs and to avoid sugary drinks and deserts . Reviewed medications with patient and discussed patient is adhering to taking all medications exactly as prescribed . Discussed plans with patient for ongoing care management follow up and provided patient with direct contact information for care management team . Provided patient with printed educational materials related to Diabetes management, Carb Counting, Carb Choices, Life's Simple 7 . Advised patient, providing education and  rationale, to check cbg daily before meals and record, calling the CCM team and or PCP for findings outside established parameters.  <80 and or >250  Patient Self Care Activities:  . Self administers medications as prescribed . Attends all scheduled provider appointments . Calls  pharmacy for medication refills . Attends church or other social activities . Performs ADL's independently . Performs IADL's independently . Calls provider office for new concerns or questions  Please see past updates related to this goal by clicking on the "Past Updates" button in the selected goal          The patient verbalized understanding of instructions provided today and declined a print copy of patient instruction materials.   Telephone follow up appointment with care management team member scheduled for: 02/20/19  Angel Little, RN, BSN, CCM Care Management Coordinator [facility]  Direct Phone: [phone]   

## 2019-02-20 ENCOUNTER — Telehealth: Payer: Self-pay

## 2019-02-21 ENCOUNTER — Ambulatory Visit: Payer: Self-pay

## 2019-02-21 DIAGNOSIS — I10 Essential (primary) hypertension: Secondary | ICD-10-CM

## 2019-02-21 DIAGNOSIS — E119 Type 2 diabetes mellitus without complications: Secondary | ICD-10-CM

## 2019-02-24 NOTE — Chronic Care Management (AMB) (Signed)
Chronic Care Management   Follow Up Note   02/21/2019 Name: Joanna Sandoval MRN: [id number] DOB: Sep 14, 1945  Referred by: [name], FNP Reason for referral : Chronic Care Management (CCM RNCM Telephone Follow up)   Joanna Sandoval is a 73 y.o. year old female who is a primary care patient of [name], [place]. The CCM team was consulted for assistance with chronic disease management and care coordination needs.    Review of patient status, including review of consultants reports, relevant laboratory and other test results, and collaboration with appropriate care team members and the patient's provider was performed as part of comprehensive patient evaluation and provision of chronic care management services.    SDOH (Social Determinants of Health) screening performed today: Stress. See Care Plan for related entries.   I spoke with [name] by telephone today for a CCM follow up regarding her anxiety and depression.   Outpatient Encounter Medications as of 02/21/2019  Medication Sig  . albuterol (PROVENTIL HFA;VENTOLIN HFA) 108 (90 Base) MCG/ACT inhaler Inhale 2 puffs into the lungs every 6 (six) hours as needed.  [name] atenolol (TENORMIN) 100 MG tablet Take 50 mg by mouth daily.   [name] atorvastatin (LIPITOR) 40 MG tablet Take 1 tablet (40 mg total) by mouth daily. Take 1/2 pill at bedtime  . D3-50 50000 units capsule   . diclofenac sodium (VOLTAREN) 1 % GEL Apply 2 g topically 4 (four) times daily.  [name] glimepiride (AMARYL) 2 MG tablet TAKE 1 AND 1/2 TABLETS BY MOUTH DAILY  . [name] 100 MG tablet TAKE 1 TABLET(100 MG) BY MOUTH DAILY  . losartan (COZAAR) 50 MG tablet Take 50 mg by mouth daily.   No facility-administered encounter medications on file as of 02/21/2019.      Goals Addressed      Patient Stated   . "I am depressed and worried because my son has Covid" (pt-stated)       Current Barriers:  . Stress and Anxiety related to COVID-19  Nurse Case Manager Clinical Goal(s):   [name] Over the next 30 days, patient will work with the CCM team to address needs related to stress and anxiety over her son having positive COVID-19  CCM RN CM Interventions:  02/21/19 spoke with patient   . Assessed for persistent ongoing depression and anxiety - patient states her son's condition is beginning to improve so she feels less anxiety today  . Discussed patient is continuing to work and stay active herself to help her cope and focus on other things going on in her life - discussed patient is appreciative of the f/u call today . Discussed the CCM team and PCP are here to support her during these difficult times; discussed patient will notify the CCM team and or PCP if her symptoms of depression and or or anxiety persist or worsen . Discussed plans with patient for ongoing care management follow up and provided patient with direct contact information for care management team  Patient Self Care Activities:  . Self administers medications as prescribed . Attends all scheduled provider appointments . Calls pharmacy for medication refills . Attends church or other social activities . Performs ADL's independently . Performs IADL's independently . Calls provider office for new concerns or questions  Please see past updates related to this goal by clicking on the "Past Updates" button in the selected goal          Telephone follow up appointment with care management team member scheduled for: 03/21/19  [facility]  Almir Botts, RN, BSN, CCM Care Management Coordinator [place] [facility]  Direct Phone: [phone]

## 2019-02-24 NOTE — Patient Instructions (Signed)
Visit Information  Goals Addressed      Patient Stated   . "I am depressed and worried because my son has Covid" (pt-stated)       Current Barriers:  . Stress and Anxiety related to COVID-19  Nurse Case Manager Clinical Goal(s):  [name] Over the next 30 days, patient will work with the CCM team to address needs related to stress and anxiety over her son having positive COVID-19  CCM RN CM Interventions:  02/21/19 spoke with patient   . Assessed for persistent ongoing depression and anxiety - patient states her son's condition is beginning to improve so she feels less anxiety today  . Discussed patient is continuing to work and stay active herself to help her cope and focus on other things going on in her life - discussed patient is appreciative of the f/u call today . Discussed the CCM team and PCP are here to support her during these difficult times; discussed patient will notify the CCM team and or PCP if her symptoms of depression and or or anxiety persist or worsen . Discussed plans with patient for ongoing care management follow up and provided patient with direct contact information for care management team  Patient Self Care Activities:  . Self administers medications as prescribed . Attends all scheduled provider appointments . Calls pharmacy for medication refills . Attends church or other social activities . Performs ADL's independently . Performs IADL's independently . Calls provider office for new concerns or questions  Please see past updates related to this goal by clicking on the "Past Updates" button in the selected goal         The patient verbalized understanding of instructions provided today and declined a print copy of patient instruction materials.   Telephone follow up appointment with care management team member scheduled for: 03/21/19  [name], RN, BSN, CCM Care Management Coordinator [place] [facility]  Direct  Phone: [phone]

## 2019-02-27 ENCOUNTER — Ambulatory Visit: Payer: Self-pay

## 2019-02-27 ENCOUNTER — Telehealth: Payer: Self-pay

## 2019-02-27 DIAGNOSIS — E119 Type 2 diabetes mellitus without complications: Secondary | ICD-10-CM

## 2019-02-27 NOTE — Chronic Care Management (AMB) (Signed)
  Chronic Care Management   Outreach Note  02/27/2019 Name: Joanna Sandoval MRN: [id number] DOB: [birth year]  Referred by: [name], FNP Reason for referral : Care Coordination   SW placed an unsuccessful outbound call to the patient in response to request made by RN Case Manager to provide support to the patient during this time of uncertainty following her son's positive COVID 19 test. SW left a HIPAA compliant voice message requesting a return call should the patient need assistance with support resources.  Follow Up Plan: No planned SW follow up at this time as the patient has been provided contact information to this Probation officer by RN Case Manager and encouraged to contact CM team as needed.  [name], BSW, CDP Social Worker, Certified Dementia Practitioner [place] [facility] [place] [facility] [phone]

## 2019-02-28 ENCOUNTER — Telehealth: Payer: Self-pay

## 2019-03-03 ENCOUNTER — Telehealth: Payer: Self-pay

## 2019-03-04 ENCOUNTER — Ambulatory Visit (INDEPENDENT_AMBULATORY_CARE_PROVIDER_SITE_OTHER): Payer: PPO | Admitting: Pharmacist

## 2019-03-04 DIAGNOSIS — E119 Type 2 diabetes mellitus without complications: Secondary | ICD-10-CM | POA: Diagnosis not present

## 2019-03-04 DIAGNOSIS — I10 Essential (primary) hypertension: Secondary | ICD-10-CM | POA: Diagnosis not present

## 2019-03-05 ENCOUNTER — Telehealth: Payer: Self-pay

## 2019-03-06 ENCOUNTER — Other Ambulatory Visit: Payer: Self-pay | Admitting: Pharmacy Technician

## 2019-03-06 NOTE — Patient Outreach (Signed)
[facility]) Care Management  03/06/2019  Dawnisha Denino [birth year] [id number]                                       Medication Assistance Referral  Referral From: [facility] Jenne Pane [facility])  Medication/Company: [name] and Proventil HFA / [employer] Patient application portion:  Education officer, museum portion: [employer] to [employer]. Laurance Flatten, FNP-BC Provider address/fax verified via: Office website   Follow up:  Will follow up with patient in 14-20 business days to confirm application(s) have been received.  Maud Deed Chana Bode [place] Certified Pharmacy Technician [place] [facility] Direct Dial:[phone]

## 2019-03-09 NOTE — Progress Notes (Signed)
Chronic Care Management   Visit Note  03/04/2019 Name: Joanna Sandoval MRN: [id number] DOB: 1946-03-05  Referred by: [name], FNP Reason for referral : Chronic Care Management   Joanna Sandoval is a 73 y.o. year old female who is a primary care patient of [name], [place]. The CCM team was consulted for assistance with chronic disease management and care coordination needs related to DMII  Review of patient status, including review of consultants reports, relevant laboratory and other test results, and collaboration with appropriate care team members and the patient's provider was performed as part of comprehensive patient evaluation and provision of chronic care management services.    I spoke with Joanna Sandoval by telephone today.  Medications: Outpatient Encounter Medications as of 03/04/2019  Medication Sig  . albuterol (PROVENTIL HFA;VENTOLIN HFA) 108 (90 Base) MCG/ACT inhaler Inhale 2 puffs into the lungs every 6 (six) hours as needed.  [name] atenolol (TENORMIN) 100 MG tablet Take 50 mg by mouth daily.   [name] atorvastatin (LIPITOR) 40 MG tablet Take 1 tablet (40 mg total) by mouth daily. Take 1/2 pill at bedtime  . D3-50 50000 units capsule   . diclofenac sodium (VOLTAREN) 1 % GEL Apply 2 g topically 4 (four) times daily.  [name] glimepiride (AMARYL) 2 MG tablet TAKE 1 AND 1/2 TABLETS BY MOUTH DAILY  . [name] 100 MG tablet TAKE 1 TABLET(100 MG) BY MOUTH DAILY  . losartan (COZAAR) 50 MG tablet Take 50 mg by mouth daily.   No facility-administered encounter medications on file as of 03/04/2019.      Objective:   Goals Addressed            This Visit's Progress     Patient Stated   . "I would like for my Diabetes to be better managed" (pt-stated)       Current Barriers:  [name] Knowledge Deficits related to Diabetes disease process and Self Health Management  Nurse Case Manager & PharmD Clinical Goal(s):  [name] Over the next 90 days, patient will verbalize basic understanding of  Diabetes disease process and self health management plan as evidenced by patient will verbalize understanding of importance to adhering to her diabetic meal plan, report taking her diabetic medications exactly as prescribed and will verbalize knowing when to call the CCM team and or provider if needed for hypo/hyperglycemic events. Goal Met . New - 02/17/19 Over the next 90 days, patient will work with the [place] team for ongoing education on [place] management interventions to help lower her A1C <7.4  CCM PharmD Interventions:  Completed CCM PharmD Telephone Follow Up call with patient on 03/04/19  Evaluation of current treatment plan related to diabetes management and patient's adherence to plan as established by provider.  Reports compliance with medications.  Comprehensive medication review performed.  NO changes to medication regimen at this time.  Patient denies hypoglycemia, reports BGs are better controlled.  She has been able to take medication as prescribed due to patient assistance.  She has also been doing better with diet/exercise.  She continues to work part time.  Patient states her FBG ranges from 120-130.  Patient's A1c has been trending down (most recently 7.4% on 01/29/19).   Anticipate lower A1c based on BG readings.  Encouraged patient to stick with drinking water and avoid sugary drinks.  Patient verbalizes understanding.  Will continue to discuss adjusting medications to achieve optimal control.  Patient is still working part-time and continues to wear her mask in public areas; only shops  in the early morning to stay safe  Educated patient on recommendations for carb amount for snacks and meals      Patient Self Care Activities:   Verbalizes understanding of the education and information provided today  Self administers medications as prescribed  Attends all scheduled provider appointments  Calls pharmacy for medication refills  Patient Self Care Activities:  .  Self administers medications as prescribed . Attends all scheduled provider appointments . Calls pharmacy for medication refills . Attends church or other social activities . Performs ADL's independently . Performs IADL's independently . Calls provider office for new concerns or questions  Please see past updates related to this goal by clicking on the "Past Updates" button in the selected goal           Plan:   The care management team will reach out to the patient again over the next 30 days.   Provider Signature Regina Eck, PharmD, BCPS Clinical Pharmacist, [facility] [place]: [phone]

## 2019-03-09 NOTE — Patient Instructions (Signed)
Visit Information  Goals Addressed            This Visit's Progress     Patient Stated   . "I would like for my Diabetes to be better managed" (pt-stated)       Current Barriers:  [name] Knowledge Deficits related to Diabetes disease process and Self Health Management  Nurse Case Manager & PharmD Clinical Goal(s):  [name] Over the next 90 days, patient will verbalize basic understanding of Diabetes disease process and self health management plan as evidenced by patient will verbalize understanding of importance to adhering to her diabetic meal plan, report taking her diabetic medications exactly as prescribed and will verbalize knowing when to call the CCM team and or provider if needed for hypo/hyperglycemic events. Goal Met . New - 02/17/19 Over the next 90 days, patient will work with the [place] team for ongoing education on [place] management interventions to help lower her A1C <7.4  CCM RN CM Interventions:  02/18/19 Inbound call received from patient  . Evaluation of current treatment plan related to Diabetes and patient's adherence to plan as established by provider. . Provided education to patient re: most recent A1C 7.4; discussed target A1C <7.0; reiterated the importance of following diabetic diet with low carbs and to avoid sugary drinks and deserts . Reviewed medications with patient and discussed patient is adhering to taking all medications exactly as prescribed . Discussed plans with patient for ongoing care management follow up and provided patient with direct contact information for care management team . Provided patient with printed educational materials related to Diabetes management, Carb Counting, Carb Choices, Life's Simple 7 . Advised patient, providing education and rationale, to check cbg daily before meals and record, calling the CCM team and or PCP for findings outside established parameters.  <80 and or >250  CCM PharmD Interventions:  Completed CCM PharmD Telephone  Follow Up call with patient on 03/04/19  Evaluation of current treatment plan related to diabetes management and patient's adherence to plan as established by provider.  Reports compliance with medications.  Comprehensive medication review performed.  NO changes to medication regimen at this time.  Patient denies hypoglycemia, reports BGs are better controlled.  She has been able to take medication as prescribed due to patient assistance.  She has also been doing better with diet/exercise.  She continues to work part time.  Patient states her FBG ranges from 120-130.  Patient's A1c has been trending down (most recently 7.4% on 01/29/19).   Anticipate lower A1c based on BG readings.  Encouraged patient to stick with drinking water and avoid sugary drinks.  Patient verbalizes understanding.  Will continue to discuss adjusting medications to achieve optimal control.  Patient is still working part-time and continues to wear her mask in public areas; only shops in the early morning to stay safe  Educated patient on recommendations for carb amount for snacks and meals      Patient Self Care Activities:   Verbalizes understanding of the education and information provided today  Self administers medications as prescribed  Attends all scheduled provider appointments  Calls pharmacy for medication refills  Patient Self Care Activities:  . Self administers medications as prescribed . Attends all scheduled provider appointments . Calls pharmacy for medication refills . Attends church or other social activities . Performs ADL's independently . Performs IADL's independently . Calls provider office for new concerns or questions  Please see past updates related to this goal by clicking on the "Past  Updates" button in the selected goal          The patient verbalized understanding of instructions provided today and declined a print copy of patient instruction materials.   The care management  team will reach out to the patient again over the next 30 days.   SIGNATURE Regina Eck, PharmD, BCPS Clinical Pharmacist, [facility] [place]: [phone]

## 2019-03-17 ENCOUNTER — Other Ambulatory Visit: Payer: Self-pay | Admitting: Pharmacy Technician

## 2019-03-17 NOTE — Patient Outreach (Signed)
[facility]) Care Management  03/17/2019  Joanna Sandoval November 08, 1945 [id number]    Return call placed to patient regarding patient assistance application(s) for [name] and Proventil HFA , HIPAA identifiers verified. Ms. Beam confirms that she received [employer] application that was mailed out to her. [name] states that she only signed 1 place on the application and mailed it back into me. Informed her that once I have received the application I will mail it back to her because she will need to sign the the other portion of application.  Follow up:  Once completed patient and provider portions have been received, I will mail into [employer].  Maud Deed Chana Bode [place] Certified Pharmacy Technician [place] [facility] Direct [phone]

## 2019-03-21 ENCOUNTER — Telehealth: Payer: Self-pay

## 2019-03-21 ENCOUNTER — Encounter: Payer: Self-pay | Admitting: Nurse Practitioner

## 2019-03-24 ENCOUNTER — Ambulatory Visit: Payer: Self-pay

## 2019-03-24 DIAGNOSIS — E119 Type 2 diabetes mellitus without complications: Secondary | ICD-10-CM

## 2019-03-24 DIAGNOSIS — I10 Essential (primary) hypertension: Secondary | ICD-10-CM

## 2019-03-25 NOTE — Patient Instructions (Signed)
Visit Information  Goals Addressed      Patient Stated   . COMPLETED: "I am depressed and worried because my son has Covid" (pt-stated)       Current Barriers:  . Stress and Anxiety related to COVID-19  Nurse Case Manager Clinical Goal(s):  [name] Over the next 30 days, patient will work with the CCM team to address needs related to stress and anxiety over her son having positive COVID-19  CCM RN CM Interventions:  03/24/19 completed call with patient   . Assessed for persistent ongoing depression and anxiety related to her son's declining health from [place] . Discussed patient's son has made a full recovery from [place] 19 and has returned to work and his normal daily routine . Discussed patient is continuing to work and stay active herself to help her cope and focus on other things going on in her life - discussed patient is appreciative of the CCM team and Dr. Baird Cancer for ensuring she has the resources and services needed to help her stay healthy . Reiterated that the CCM team and PCP are here to support her during these difficult times; discussed patient will notify the CCM team and or PCP if her symptoms of depression and or or anxiety persist or worsen . Discussed plans with patient for ongoing care management follow up and provided patient with direct contact information for care management team  Patient Self Care Activities:  . Self administers medications as prescribed . Attends all scheduled provider appointments . Calls pharmacy for medication refills . Attends church or other social activities . Performs ADL's independently . Performs IADL's independently . Calls provider office for new concerns or questions  Please see past updates related to this goal by clicking on the "Past Updates" button in the selected goal      . "I would like for my Diabetes to be better managed" (pt-stated)       Current Barriers:  [name] Knowledge Deficits related to Diabetes disease process and Self  Health Management  Nurse Case Manager & PharmD Clinical Goal(s):  [name] Over the next 90 days, patient will verbalize basic understanding of Diabetes disease process and self health management plan as evidenced by patient will verbalize understanding of importance to adhering to her diabetic meal plan, report taking her diabetic medications exactly as prescribed and will verbalize knowing when to call the CCM team and or provider if needed for hypo/hyperglycemic events. Goal Met . New - 02/17/19 Over the next 90 days, patient will work with the [facility] team for ongoing education on [place] management interventions to help lower her A1C <7.4  CCM RN CM Interventions:  03/24/19 call completed with patient  Inbound call received from patient stating she needs a refill for her glucometer strips; she uses the [facility] . Evaluation of current treatment plan related to Diabetes and patient's adherence to plan as established by provider. . Advised patient to check with her pharmacy later on today to ensure the refill has been completed . Reviewed medications with patient and discussed patient is adhering to taking all medications exactly as prescribed . Collaborated with Dr. Baird Cancer regarding patient's request for a refill for her Solo Star glucose strips . Discussed plans with patient for ongoing care management follow up and provided patient with direct contact information for care management team . Advised patient, providing education and rationale, to check cbg daily before meals and record, calling the CCM team and or PCP for findings outside established  parameters.  <80 and or >250 . Discussed patient received the printed patient educational materials previously sent and has found them to be very helpful and educational, she feels she has more knowledge about her diabetes now . Positive reinforcement provided to patient for adhering to her prescribed DM treatment plan   CCM PharmD  Interventions:  Completed CCM PharmD Telephone Follow Up call with patient on 03/04/19  Evaluation of current treatment plan related to diabetes management and patient's adherence to plan as established by provider.  Reports compliance with medications.  Comprehensive medication review performed.  NO changes to medication regimen at this time.  Patient denies hypoglycemia, reports BGs are better controlled.  She has been able to take medication as prescribed due to patient assistance.  She has also been doing better with diet/exercise.  She continues to work part time.  Patient states her FBG ranges from 120-130.  Patient's A1c has been trending down (most recently 7.4% on 01/29/19).   Anticipate lower A1c based on BG readings.  Encouraged patient to stick with drinking water and avoid sugary drinks.  Patient verbalizes understanding.  Will continue to discuss adjusting medications to achieve optimal control.  Patient is still working part-time and continues to wear her mask in public areas; only shops in the early morning to stay safe  Educated patient on recommendations for carb amount for snacks and meals      Patient Self Care Activities:   Verbalizes understanding of the education and information provided today  Self administers medications as prescribed  Attends all scheduled provider appointments  Calls pharmacy for medication refills  Patient Self Care Activities:  . Self administers medications as prescribed . Attends all scheduled provider appointments . Calls pharmacy for medication refills . Attends church or other social activities . Performs ADL's independently . Performs IADL's independently . Calls provider office for new concerns or questions  Please see past updates related to this goal by clicking on the "Past Updates" button in the selected goal       . COMPLETED: "The pressure behind my eyes is elevated" (pt-stated)       Current Barriers:  [name] Knowledge  Deficits related to diagnosis and treatment management for bilateral Intraocular pressure . Diabetes Mellitus type 2  Nurse Case Manager Clinical Goal(s):  [name] Over the next 30 days, patient will work with the Eye Specialist to address needs related to bilateral intraocular pressure  Goal Met   CCM RN CM Interventions:  03/24/19 call completed with patient  Inbound call received from patient  . Evaluation of current treatment plan related to increased intraocular pressure and patient's adherence to plan as established by provider . Discussed patient is doing well in regards to her glaucoma; reports she is using eye drops exactly as prescribed  . Discussed patient wears readers but may get prescription glasses if she notices a change in her vision per her discussion with the Ophthalmologist . Discussed patient is currently satisfied with her plan of care and effectiveness from the prescribed eye drops  Patient Self Care Activities:  . Self administers medications as prescribed . Attends all scheduled provider appointments . Calls pharmacy for medication refills . Performs ADL's independently . Performs IADL's independently . Calls provider office for new concerns or questions  Please see past updates related to this goal by clicking on the "Past Updates" button in the selected goal         The patient verbalized understanding of instructions provided today and declined  a print copy of patient instruction materials.   Telephone follow up appointment with care management team member scheduled for: 05/19/19  [name], RN, BSN, CCM Care Management Coordinator [place] [facility]  Direct Phone: [phone]

## 2019-03-25 NOTE — Chronic Care Management (AMB) (Signed)
Chronic Care Management   Follow Up Note   03/24/2019 Name: Sema Stangler MRN: [id number] DOB: 09/24/1945  Referred by: [name], FNP Reason for referral : Chronic Care Management (CCM RNCM Telephone Inbound Call )   Natally Ribera is a 73 y.o. year old female who is a primary care patient of [name], [place]. The CCM team was consulted for assistance with chronic disease management and care coordination needs.    Review of patient status, including review of consultants reports, relevant laboratory and other test results, and collaboration with appropriate care team members and the patient's provider was performed as part of comprehensive patient evaluation and provision of chronic care management services.    SDOH (Social Determinants of Health) screening performed today: None. See Care Plan for related entries.   Inbound call from patient requesting assistance with a refill for her glucose strips and to f/u on her DM and depression.   Outpatient Encounter Medications as of 03/24/2019  Medication Sig  . albuterol (PROVENTIL HFA;VENTOLIN HFA) 108 (90 Base) MCG/ACT inhaler Inhale 2 puffs into the lungs every 6 (six) hours as needed.  [name] atenolol (TENORMIN) 100 MG tablet Take 50 mg by mouth daily.   [name] atorvastatin (LIPITOR) 40 MG tablet Take 1 tablet (40 mg total) by mouth daily. Take 1/2 pill at bedtime  . D3-50 50000 units capsule   . diclofenac sodium (VOLTAREN) 1 % GEL Apply 2 g topically 4 (four) times daily.  [name] glimepiride (AMARYL) 2 MG tablet TAKE 1 AND 1/2 TABLETS BY MOUTH DAILY  . [name] 100 MG tablet TAKE 1 TABLET(100 MG) BY MOUTH DAILY  . losartan (COZAAR) 50 MG tablet Take 50 mg by mouth daily.   No facility-administered encounter medications on file as of 03/24/2019.      Goals Addressed      Patient Stated   . COMPLETED: "I am depressed and worried because my son has Covid" (pt-stated)       Current Barriers:  . Stress and Anxiety related to COVID-19   Nurse Case Manager Clinical Goal(s):  [name] Over the next 30 days, patient will work with the CCM team to address needs related to stress and anxiety over her son having positive COVID-19  CCM RN CM Interventions:  03/24/19 completed call with patient   . Assessed for persistent ongoing depression and anxiety related to her son's declining health from [place] . Discussed patient's son has made a full recovery from [place] 19 and has returned to work and his normal daily routine . Discussed patient is continuing to work and stay active herself to help her cope and focus on other things going on in her life - discussed patient is appreciative of the CCM team and Dr. Baird Cancer for ensuring she has the resources and services needed to help her stay healthy . Reiterated that the CCM team and PCP are here to support her during these difficult times; discussed patient will notify the CCM team and or PCP if her symptoms of depression and or or anxiety persist or worsen . Discussed plans with patient for ongoing care management follow up and provided patient with direct contact information for care management team  Patient Self Care Activities:  . Self administers medications as prescribed . Attends all scheduled provider appointments . Calls pharmacy for medication refills . Attends church or other social activities . Performs ADL's independently . Performs IADL's independently . Calls provider office for new concerns or questions  Please see past updates related to  this goal by clicking on the "Past Updates" button in the selected goal      . "I would like for my Diabetes to be better managed" (pt-stated)       Current Barriers:  [name] Knowledge Deficits related to Diabetes disease process and Self Health Management  Nurse Case Manager & PharmD Clinical Goal(s):  [name] Over the next 90 days, patient will verbalize basic understanding of Diabetes disease process and self health management plan as evidenced by  patient will verbalize understanding of importance to adhering to her diabetic meal plan, report taking her diabetic medications exactly as prescribed and will verbalize knowing when to call the CCM team and or provider if needed for hypo/hyperglycemic events. Goal Met . New - 02/17/19 Over the next 90 days, patient will work with the [facility] team for ongoing education on [place] management interventions to help lower her A1C <7.4  CCM RN CM Interventions:  03/24/19 call completed with patient  Inbound call received from patient stating she needs a refill for her glucometer strips; she uses the [facility] . Evaluation of current treatment plan related to Diabetes and patient's adherence to plan as established by provider. . Advised patient to check with her pharmacy later on today to ensure the refill has been completed . Reviewed medications with patient and discussed patient is adhering to taking all medications exactly as prescribed . Collaborated with Dr. Baird Cancer regarding patient's request for a refill for her Solo Star glucose strips . Discussed plans with patient for ongoing care management follow up and provided patient with direct contact information for care management team . Advised patient, providing education and rationale, to check cbg daily before meals and record, calling the CCM team and or PCP for findings outside established parameters.  <80 and or >250 . Discussed patient received the printed patient educational materials previously sent and has found them to be very helpful and educational, she feels she has more knowledge about her diabetes now . Positive reinforcement provided to patient for adhering to her prescribed DM treatment plan   CCM PharmD Interventions:  Completed CCM PharmD Telephone Follow Up call with patient on 03/04/19  Evaluation of current treatment plan related to diabetes management and patient's adherence to plan as established by provider.   Reports compliance with medications.  Comprehensive medication review performed.  NO changes to medication regimen at this time.  Patient denies hypoglycemia, reports BGs are better controlled.  She has been able to take medication as prescribed due to patient assistance.  She has also been doing better with diet/exercise.  She continues to work part time.  Patient states her FBG ranges from 120-130.  Patient's A1c has been trending down (most recently 7.4% on 01/29/19).   Anticipate lower A1c based on BG readings.  Encouraged patient to stick with drinking water and avoid sugary drinks.  Patient verbalizes understanding.  Will continue to discuss adjusting medications to achieve optimal control.  Patient is still working part-time and continues to wear her mask in public areas; only shops in the early morning to stay safe  Educated patient on recommendations for carb amount for snacks and meals      Patient Self Care Activities:   Verbalizes understanding of the education and information provided today  Self administers medications as prescribed  Attends all scheduled provider appointments  Calls pharmacy for medication refills  Patient Self Care Activities:  . Self administers medications as prescribed . Attends all scheduled provider appointments .  Calls pharmacy for medication refills . Attends church or other social activities . Performs ADL's independently . Performs IADL's independently . Calls provider office for new concerns or questions  Please see past updates related to this goal by clicking on the "Past Updates" button in the selected goal       . COMPLETED: "The pressure behind my eyes is elevated" (pt-stated)       Current Barriers:  [name] Knowledge Deficits related to diagnosis and treatment management for bilateral Intraocular pressure . Diabetes Mellitus type 2  Nurse Case Manager Clinical Goal(s):  [name] Over the next 30 days, patient will work with the Eye  Specialist to address needs related to bilateral intraocular pressure  Goal Met   CCM RN CM Interventions:  03/24/19 call completed with patient  Inbound call received from patient  . Evaluation of current treatment plan related to increased intraocular pressure and patient's adherence to plan as established by provider . Discussed patient is doing well in regards to her glaucoma; reports she is using eye drops exactly as prescribed  . Discussed patient wears readers but may get prescription glasses if she notices a change in her vision per her discussion with the Ophthalmologist . Discussed patient is currently satisfied with her plan of care and effectiveness from the prescribed eye drops  Patient Self Care Activities:  . Self administers medications as prescribed . Attends all scheduled provider appointments . Calls pharmacy for medication refills . Performs ADL's independently . Performs IADL's independently . Calls provider office for new concerns or questions  Please see past updates related to this goal by clicking on the "Past Updates" button in the selected goal          Telephone follow up appointment with care management team member scheduled for: 05/19/19  [name], RN, BSN, CCM Care Management Coordinator [place] [facility]  Direct Phone: [phone]

## 2019-03-26 ENCOUNTER — Telehealth: Payer: Self-pay

## 2019-03-27 ENCOUNTER — Ambulatory Visit: Payer: Self-pay

## 2019-03-27 ENCOUNTER — Ambulatory Visit (INDEPENDENT_AMBULATORY_CARE_PROVIDER_SITE_OTHER): Payer: PPO | Admitting: Pharmacist

## 2019-03-27 ENCOUNTER — Other Ambulatory Visit: Payer: Self-pay | Admitting: Nurse Practitioner

## 2019-03-27 ENCOUNTER — Other Ambulatory Visit: Payer: Self-pay | Admitting: Pharmacy Technician

## 2019-03-27 DIAGNOSIS — E1165 Type 2 diabetes mellitus with hyperglycemia: Secondary | ICD-10-CM

## 2019-03-27 DIAGNOSIS — I10 Essential (primary) hypertension: Secondary | ICD-10-CM

## 2019-03-27 DIAGNOSIS — E119 Type 2 diabetes mellitus without complications: Secondary | ICD-10-CM

## 2019-03-27 MED ORDER — GLUCOSE BLOOD VI STRP: ORAL_STRIP | 3 refills | Status: DC

## 2019-03-27 NOTE — Patient Outreach (Signed)
[facility]) Care Management  03/27/2019  Joanna Sandoval [birth year] NT:[id number]   Received patient portion(s) of patient assistance application(s) for [name] and Proventil HFA. Prepared to mail completed application and required documents into [employer].  Will follow up with company(ies) in 10-14 business days to check status of application(s).  Maud Deed Chana Bode [place] Certified Pharmacy Technician [place] [facility] Direct Dial:[phone]

## 2019-03-28 NOTE — Chronic Care Management (AMB) (Signed)
Chronic Care Management   Follow Up Note   03/27/2019 Name: Joanna Sandoval MRN: [id number] DOB: 07/22/1945  Referred by: Joanna Brine, FNP Reason for referral : Chronic Care Management (CCM RNCM Telephone Inbound call )   Joanna Sandoval is a 73 y.o. year old female who is a primary care patient of Joanna Sandoval, [place]. The CCM team was consulted for assistance with chronic disease management and care coordination needs.    Review of patient status, including review of consultants reports, relevant laboratory and other test results, and collaboration with appropriate care team members and the patient's provider was performed as part of comprehensive patient evaluation and provision of chronic care management services.    SDOH (Social Determinants of Health) screening performed today: None. See Care Plan for related entries.   Outpatient Encounter Medications as of 03/27/2019  Medication Sig  . albuterol (PROVENTIL HFA;VENTOLIN HFA) 108 (90 Base) MCG/ACT inhaler Inhale 2 puffs into the lungs every 6 (six) hours as needed.  [name] atenolol (TENORMIN) 100 MG tablet Take 50 mg by mouth daily.   [name] atorvastatin (LIPITOR) 40 MG tablet Take 1 tablet (40 mg total) by mouth daily. Take 1/2 pill at bedtime  . D3-50 50000 units capsule   . diclofenac sodium (VOLTAREN) 1 % GEL Apply 2 g topically 4 (four) times daily.  [name] glimepiride (AMARYL) 2 MG tablet TAKE 1 AND 1/2 TABLETS BY MOUTH DAILY  . [name] 100 MG tablet TAKE 1 TABLET(100 MG) BY MOUTH DAILY  . losartan (COZAAR) 50 MG tablet Take 50 mg by mouth daily.   No facility-administered encounter medications on file as of 03/27/2019.     Goals Addressed      Patient Stated   . "I would like for my Diabetes to be better managed" (pt-stated)       Current Barriers:  [name] Knowledge Deficits related to Diabetes disease process and Self Health Management  Nurse Case Manager & PharmD Clinical Goal(s):  [name] Over the next 90 days, patient will verbalize  basic understanding of Diabetes disease process and self health management plan as evidenced by patient will verbalize understanding of importance to adhering to her diabetic meal plan, report taking her diabetic medications exactly as prescribed and will verbalize knowing when to call the CCM team and or provider if needed for hypo/hyperglycemic events. Goal Met . New - 02/17/19 Over the next 90 days, patient will work with the [facility] team for ongoing education on [place] management interventions to help lower her A1C <7.4  CCM RN CM Interventions:  03/27/19 call completed with patient  Inbound call received from patient stating the pharmacy did not receive an Rx from her provider for the Solo star glucose strips . Evaluation of current treatment plan related to Diabetes and patient's adherence to plan as established by provider. . Advised patient to check with her pharmacy later on today to ensure the refill has been completed and or someone from the PCP office and or embedded CCM team will contact her . Reviewed medications with patient and discussed patient is adhering to taking all medications exactly as prescribed . Collaborated with provider Joanna Brine, FNP regarding patient's request for a refill for her Solo Star glucose strips; discussed this is not formulary for patients insurance and may be expensive; Collaborated with embedded Pharm D Joanna Sandoval requesting information for an alternative glucose strip for Joanna Sandoval; discussed Joanna Sandoval will contact the patient today to advise  . Discussed plans with patient for ongoing care management  follow up and provided patient with direct contact information for care management team . Advised patient, providing education and rationale, to check cbg daily before meals and record, calling the CCM team and or PCP for findings outside established parameters.  <80 and or >250   CCM PharmD Interventions:  Completed CCM PharmD Telephone Follow Up call  with patient on 03/04/19  Evaluation of current treatment plan related to diabetes management and patient's adherence to plan as established by provider.  Reports compliance with medications.  Comprehensive medication review performed.  NO changes to medication regimen at this time.  Patient denies hypoglycemia, reports BGs are better controlled.  She has been able to take medication as prescribed due to patient assistance.  She has also been doing better with diet/exercise.  She continues to work part time.  Patient states her FBG ranges from 120-130.  Patient's A1c has been trending down (most recently 7.4% on 01/29/19).   Anticipate lower A1c based on BG readings.  Encouraged patient to stick with drinking water and avoid sugary drinks.  Patient verbalizes understanding.  Will continue to discuss adjusting medications to achieve optimal control.  Patient is still working part-time and continues to wear her mask in public areas; only shops in the early morning to stay safe  Educated patient on recommendations for carb amount for snacks and meals    Patient Self Care Activities:   Verbalizes understanding of the education and information provided today  Self administers medications as prescribed  Attends all scheduled provider appointments  Calls pharmacy for medication refills  Patient Self Care Activities:  . Self administers medications as prescribed . Attends all scheduled provider appointments . Calls pharmacy for medication refills . Attends church or other social activities . Performs ADL's independently . Performs IADL's independently . Calls provider office for new concerns or questions  Please see past updates related to this goal by clicking on the "Past Updates" button in the selected goal         Telephone follow up appointment with care management team member scheduled for: 06/08/19  Joanna Merino, RN, BSN, CCM Care Management Coordinator [place]  Management/[facility]  Direct Phone: [phone]

## 2019-03-28 NOTE — Patient Instructions (Signed)
Visit Information  Goals Addressed      Patient Stated   . "I would like for my Diabetes to be better managed" (pt-stated)       Current Barriers:  [name] Knowledge Deficits related to Diabetes disease process and Self Health Management  Nurse Case Manager & PharmD Clinical Goal(s):  [name] Over the next 90 days, patient will verbalize basic understanding of Diabetes disease process and self health management plan as evidenced by patient will verbalize understanding of importance to adhering to her diabetic meal plan, report taking her diabetic medications exactly as prescribed and will verbalize knowing when to call the CCM team and or provider if needed for hypo/hyperglycemic events. Goal Met . New - 02/17/19 Over the next 90 days, patient will work with the [place] team for ongoing education on [place] management interventions to help lower her A1C <7.4  CCM RN CM Interventions:  03/27/19 call completed with patient  Inbound call received from patient stating the pharmacy did not receive an Rx from her provider for the Solo star glucose strips . Evaluation of current treatment plan related to Diabetes and patient's adherence to plan as established by provider. . Advised patient to check with her pharmacy later on today to ensure the refill has been completed and or someone from the PCP office and or embedded CCM team will contact her . Reviewed medications with patient and discussed patient is adhering to taking all medications exactly as prescribed . Collaborated with provider [name], FNP regarding patient's request for a refill for her Solo Star glucose strips; discussed this is not formulary for patients insurance and may be expensive; Collaborated with embedded Pharm D [name] requesting information for an alternative glucose strip for [name]; discussed Almyra Free will contact the patient today to advise  . Discussed plans with patient for ongoing care management follow up and provided  patient with direct contact information for care management team . Advised patient, providing education and rationale, to check cbg daily before meals and record, calling the CCM team and or PCP for findings outside established parameters.  <80 and or >250   CCM PharmD Interventions:  Completed CCM PharmD Telephone Follow Up call with patient on 03/04/19  Evaluation of current treatment plan related to diabetes management and patient's adherence to plan as established by provider.  Reports compliance with medications.  Comprehensive medication review performed.  NO changes to medication regimen at this time.  Patient denies hypoglycemia, reports BGs are better controlled.  She has been able to take medication as prescribed due to patient assistance.  She has also been doing better with diet/exercise.  She continues to work part time.  Patient states her FBG ranges from 120-130.  Patient's A1c has been trending down (most recently 7.4% on 01/29/19).   Anticipate lower A1c based on BG readings.  Encouraged patient to stick with drinking water and avoid sugary drinks.  Patient verbalizes understanding.  Will continue to discuss adjusting medications to achieve optimal control.  Patient is still working part-time and continues to wear her mask in public areas; only shops in the early morning to stay safe  Educated patient on recommendations for carb amount for snacks and meals      Patient Self Care Activities:   Verbalizes understanding of the education and information provided today  Self administers medications as prescribed  Attends all scheduled provider appointments  Calls pharmacy for medication refills  Patient Self Care Activities:  . Self administers medications as  prescribed . Attends all scheduled provider appointments . Calls pharmacy for medication refills . Attends church or other social activities . Performs ADL's independently . Performs IADL's independently . Calls  provider office for new concerns or questions  Please see past updates related to this goal by clicking on the "Past Updates" button in the selected goal        The patient verbalized understanding of instructions provided today and declined a print copy of patient instruction materials.   Telephone follow up appointment with care management team member scheduled for: 05/19/19  [name], RN, BSN, CCM Care Management Coordinator [facility]  Direct Phone: [phone]

## 2019-03-31 MED ORDER — SOLUS V2 TEST VI STRP: ORAL_STRIP | 3 refills | Status: DC

## 2019-03-31 NOTE — Patient Instructions (Signed)
Visit Information  Goals Addressed            This Visit's Progress     Patient Stated   . "I would like for my Diabetes to be better managed" (pt-stated)       Current Barriers:  [name] Knowledge Deficits related to Diabetes disease process and Self Health Management  Nurse Case Manager & PharmD Clinical Goal(s):  [name] Over the next 90 days, patient will verbalize basic understanding of Diabetes disease process and self health management plan as evidenced by patient will verbalize understanding of importance to adhering to her diabetic meal plan, report taking her diabetic medications exactly as prescribed and will verbalize knowing when to call the CCM team and or provider if needed for hypo/hyperglycemic events. Goal Met . New - 02/17/19 Over the next 90 days, patient will work with the [place] team for ongoing education on [place] management interventions to help lower her A1C <7.4  CCM RN CM Interventions:  03/27/19 call completed with patient  Inbound call received from patient stating the pharmacy did not receive an Rx from her provider for the Solo star glucose strips . Evaluation of current treatment plan related to Diabetes and patient's adherence to plan as established by provider. . Advised patient to check with her pharmacy later on today to ensure the refill has been completed and or someone from the PCP office and or embedded CCM team will contact her . Reviewed medications with patient and discussed patient is adhering to taking all medications exactly as prescribed . Collaborated with provider [name], FNP regarding patient's request for a refill for her Solo Star glucose strips; discussed this is not formulary for patients insurance and may be expensive; Collaborated with embedded Pharm D [name] requesting information for an alternative glucose strip for [name]; discussed Almyra Free will contact the patient today to advise  . Discussed plans with patient for ongoing  care management follow up and provided patient with direct contact information for care management team . Advised patient, providing education and rationale, to check cbg daily before meals and record, calling the CCM team and or PCP for findings outside established parameters.  <80 and or >250   CCM PharmD Interventions:  Completed CCM PharmD Telephone Follow Up call with patient on 03/28/19  Evaluation of current treatment plan related to diabetes management and patient's adherence to plan as established by provider.  Reports compliance with medications.  Comprehensive medication review performed.  NO changes to medication regimen at this time.  Patient denies hypoglycemia, reports BGs are better controlled.  She has been able to take medication as prescribed due to patient assistance.  She has also been doing better with diet/exercise.  She continues to work part time.  Patient states her FBG ranges from 120-130.  Patient's A1c has been trending down (most recently 7.4% on 01/29/19).   Anticipate lower A1c based on BG readings.  03/28/19-->Patient states she is using a Solus V2 glucometer.  Test strips called in per patient request for twice daily testing  Encouraged patient to stick with drinking water and avoid sugary drinks.  Patient verbalizes understanding.  Will continue to discuss adjusting medications to achieve optimal control.  Patient is still working part-time and continues to wear her mask in public areas; only shops in the early morning to stay safe  Educated patient on recommendations for carb amount for snacks and meals      Patient Self Care Activities:   Verbalizes understanding of  the education and information provided today  Self administers medications as prescribed  Attends all scheduled provider appointments  Calls pharmacy for medication refills  Patient Self Care Activities:  . Self administers medications as prescribed . Attends all scheduled provider  appointments . Calls pharmacy for medication refills . Attends church or other social activities . Performs ADL's independently . Performs IADL's independently . Calls provider office for new concerns or questions  Please see past updates related to this goal by clicking on the "Past Updates" button in the selected goal          The patient verbalized understanding of instructions provided today and declined a print copy of patient instruction materials.   The care management team will reach out to the patient again over the next 30 days.   SIGNATURE Regina Eck, PharmD, BCPS Clinical Pharmacist, [facility] [place]: [phone]

## 2019-03-31 NOTE — Progress Notes (Signed)
Chronic Care Management    Visit Note  03/28/2019 Name: Joanna Sandoval MRN: [id number] DOB: 1945-04-27  Referred by: [name], FNP Reason for referral : Chronic Care Management   Wandalee Klang is a 73 y.o. year old female who is a primary care patient of [name], [place]. The CCM team was consulted for assistance with chronic disease management and care coordination needs related to DMII  Review of patient status, including review of consultants reports, relevant laboratory and other test results, and collaboration with appropriate care team members and the patient's provider was performed as part of comprehensive patient evaluation and provision of chronic care management services.    I spoke with Joanna Sandoval by telephone today.  Medications: Outpatient Encounter Medications as of 03/27/2019  Medication Sig  . glucose blood (SOLUS V2 TEST) test strip Use as instructed to test blood sugar twice daily as directed. Brand: Solus V2 test strips. DX: E11.9  . [DISCONTINUED] glucose blood (SOLUS V2 TEST) test strip by Other route 2 (two) times daily. Use as instructed to test twice daily as directed. Brand: Solus V2 test strips. DX: E11.9  . albuterol (PROVENTIL HFA;VENTOLIN HFA) 108 (90 Base) MCG/ACT inhaler Inhale 2 puffs into the lungs every 6 (six) hours as needed.  [name] atenolol (TENORMIN) 100 MG tablet Take 50 mg by mouth daily.   [name] atorvastatin (LIPITOR) 40 MG tablet Take 1 tablet (40 mg total) by mouth daily. Take 1/2 pill at bedtime  . D3-50 50000 units capsule   . diclofenac sodium (VOLTAREN) 1 % GEL Apply 2 g topically 4 (four) times daily.  [name] glimepiride (AMARYL) 2 MG tablet TAKE 1 AND 1/2 TABLETS BY MOUTH DAILY  . [name] 100 MG tablet TAKE 1 TABLET(100 MG) BY MOUTH DAILY  . losartan (COZAAR) 50 MG tablet Take 50 mg by mouth daily.  . [DISCONTINUED] glucose blood test strip Check blood sugar two times a day   No facility-administered encounter medications on file as of  03/27/2019.     Objective:   Goals Addressed            This Visit's Progress     Patient Stated   . "I would like for my Diabetes to be better managed" (pt-stated)       Current Barriers:  [name] Knowledge Deficits related to Diabetes disease process and Self Health Management  Nurse Case Manager & PharmD Clinical Goal(s):  [name] Over the next 90 days, patient will verbalize basic understanding of Diabetes disease process and self health management plan as evidenced by patient will verbalize understanding of importance to adhering to her diabetic meal plan, report taking her diabetic medications exactly as prescribed and will verbalize knowing when to call the CCM team and or provider if needed for hypo/hyperglycemic events. Goal Met . New - 02/17/19 Over the next 90 days, patient will work with the [place] team for ongoing education on [place] management interventions to help lower her A1C <7.4  CCM RN CM Interventions:  03/27/19 call completed with patient  Inbound call received from patient stating the pharmacy did not receive an Rx from her provider for the Solo star glucose strips . Evaluation of current treatment plan related to Diabetes and patient's adherence to plan as established by provider. . Advised patient to check with her pharmacy later on today to ensure the refill has been completed and or someone from the PCP office and or embedded CCM team will contact her . Reviewed medications with patient and discussed  patient is adhering to taking all medications exactly as prescribed . Collaborated with provider [name], FNP regarding patient's request for a refill for her Solo Star glucose strips; discussed this is not formulary for patients insurance and may be expensive; Collaborated with embedded Pharm D [name] requesting information for an alternative glucose strip for Joanna Sandoval; discussed Almyra Free will contact the patient today to advise  . Discussed plans with patient  for ongoing care management follow up and provided patient with direct contact information for care management team . Advised patient, providing education and rationale, to check cbg daily before meals and record, calling the CCM team and or PCP for findings outside established parameters.  <80 and or >250   CCM PharmD Interventions:  Completed CCM PharmD Telephone Follow Up call with patient on 03/28/19  Evaluation of current treatment plan related to diabetes management and patient's adherence to plan as established by provider.  Reports compliance with medications.  Comprehensive medication review performed.  NO changes to medication regimen at this time.  Patient denies hypoglycemia, reports BGs are better controlled.  She has been able to take medication as prescribed due to patient assistance.  She has also been doing better with diet/exercise.  She continues to work part time.  Patient states her FBG ranges from 120-130.  Patient's A1c has been trending down (most recently 7.4% on 01/29/19).   Anticipate lower A1c based on BG readings.  03/28/19-->Patient states she is using a Solus V2 glucometer.  Test strips called in per patient request for twice daily testing  Encouraged patient to stick with drinking water and avoid sugary drinks.  Patient verbalizes understanding.  Will continue to discuss adjusting medications to achieve optimal control.  Patient is still working part-time and continues to wear her mask in public areas; only shops in the early morning to stay safe  Educated patient on recommendations for carb amount for snacks and meals      Patient Self Care Activities:   Verbalizes understanding of the education and information provided today  Self administers medications as prescribed  Attends all scheduled provider appointments  Calls pharmacy for medication refills  Patient Self Care Activities:  . Self administers medications as prescribed . Attends all scheduled  provider appointments . Calls pharmacy for medication refills . Attends church or other social activities . Performs ADL's independently . Performs IADL's independently . Calls provider office for new concerns or questions  Please see past updates related to this goal by clicking on the "Past Updates" button in the selected goal          Plan:   The care management team will reach out to the patient again over the next 30 days.   Provider Signature Regina Eck, PharmD, BCPS Clinical Pharmacist, [facility] [place]: [phone]

## 2019-04-14 ENCOUNTER — Ambulatory Visit: Payer: Self-pay | Admitting: Pharmacist

## 2019-04-14 DIAGNOSIS — E1165 Type 2 diabetes mellitus with hyperglycemia: Secondary | ICD-10-CM | POA: Diagnosis not present

## 2019-04-14 DIAGNOSIS — I10 Essential (primary) hypertension: Secondary | ICD-10-CM | POA: Diagnosis not present

## 2019-04-14 MED ORDER — SOLUS V2 TEST VI STRP: ORAL_STRIP | 3 refills | Status: DC

## 2019-04-14 NOTE — Progress Notes (Signed)
Chronic Care Management   Visit Note  04/14/2019 Name: Joanna Sandoval MRN: [id number] DOB: [birth year]  Referred by: [name], FNP Reason for referral : Chronic Care Management   Joanna Sandoval is a 73 y.o. year old female who is a primary care patient of [name], [place]. The CCM team was consulted for assistance with chronic disease management and care coordination needs related to DMII  Review of patient status, including review of consultants reports, relevant laboratory and other test results, and collaboration with appropriate care team members and the patient's provider was performed as part of comprehensive patient evaluation and provision of chronic care management services.     Medications: Outpatient Encounter Medications as of 04/14/2019  Medication Sig  . albuterol (PROVENTIL HFA;VENTOLIN HFA) 108 (90 Base) MCG/ACT inhaler Inhale 2 puffs into the lungs every 6 (six) hours as needed.  Joanna Sandoval atenolol (TENORMIN) 100 MG tablet Take 50 mg by mouth daily.   Joanna Sandoval atorvastatin (LIPITOR) 40 MG tablet Take 1 tablet (40 mg total) by mouth daily. Take 1/2 pill at bedtime  . D3-50 50000 units capsule   . diclofenac sodium (VOLTAREN) 1 % GEL Apply 2 g topically 4 (four) times daily.  Joanna Sandoval glimepiride (AMARYL) 2 MG tablet TAKE 1 AND 1/2 TABLETS BY MOUTH DAILY  . glucose blood (SOLUS V2 TEST) test strip Use as instructed to test blood sugar twice daily as directed. Brand: Solus V2 test strips. DX: E11.9  . [name] 100 MG tablet TAKE 1 TABLET(100 MG) BY MOUTH DAILY  . losartan (COZAAR) 50 MG tablet Take 50 mg by mouth daily.  . [DISCONTINUED] glucose blood (SOLUS V2 TEST) test strip Use as instructed to test blood sugar twice daily as directed. Brand: Solus V2 test strips. DX: E11.9   No facility-administered encounter medications on file as of 04/14/2019.     Objective:   Goals Addressed            This Visit's Progress     Patient Stated   . "I would like for my Diabetes to be  better managed" (pt-stated)       Current Barriers:  Joanna Sandoval Knowledge Deficits related to Diabetes disease process and Self Health Management  Nurse Case Manager & PharmD Clinical Goal(s):  Joanna Sandoval Over the next 90 days, patient will verbalize basic understanding of Diabetes disease process and self health management plan as evidenced by patient will verbalize understanding of importance to adhering to her diabetic meal plan, report taking her diabetic medications exactly as prescribed and will verbalize knowing when to call the CCM team and or provider if needed for hypo/hyperglycemic events. Goal Met . New - 02/17/19 Over the next 90 days, patient will work with the [place] team for ongoing education on [place] management interventions to help lower her A1C <7.4  CCM RN CM Interventions:  03/27/19 call completed with patient  Inbound call received from patient stating the pharmacy did not receive an Rx from her provider for the Solo star glucose strips . Evaluation of current treatment plan related to Diabetes and patient's adherence to plan as established by provider. . Advised patient to check with her pharmacy later on today to ensure the refill has been completed and or someone from the PCP office and or embedded CCM team will contact her . Reviewed medications with patient and discussed patient is adhering to taking all medications exactly as prescribed . Collaborated with provider [name], FNP regarding patient's request for a refill for her Solo Star glucose  strips; discussed this is not formulary for patients insurance and may be expensive; Collaborated with embedded Pharm D [name] requesting information for an alternative glucose strip for [name]; discussed Almyra Free will contact the patient today to advise  . Discussed plans with patient for ongoing care management follow up and provided patient with direct contact information for care management team . Advised patient, providing  education and rationale, to check cbg daily before meals and record, calling the CCM team and or PCP for findings outside established parameters.  <80 and or >250   CCM PharmD Interventions:  Completed CCM PharmD Telephone Follow Up call with patient on 04/14/19  Evaluation of current treatment plan related to diabetes management and patient's adherence to plan as established by provider.  Reports compliance with medications.  Comprehensive medication review performed.  NO changes to medication regimen at this time.  Patient denies hypoglycemia, reports BGs are better controlled.  She has been able to take medication as prescribed due to patient assistance.  She has also been doing better with diet/exercise.  She continues to work part time.  Patient states her FBG ranges from 120-130.  Patient's A1c has been trending down (most recently 7.4% on 01/29/19).   Anticipate lower A1c based on BG readings.  04/14/19-->Patient states she is using a Solus V2 glucometer.  Test strips called in per patient request for twice daily testing.  Test strip RX did not transmit to the pharmacy.  Will re-submit  Encouraged patient to stick with drinking water and avoid sugary drinks.  Patient verbalizes understanding.  Will continue to discuss adjusting medications to achieve optimal control.  Patient is still working part-time and continues to wear her mask in public areas; only shops in the early morning to stay safe  Educated patient on recommendations for carb amount for snacks and meals     Patient Self Care Activities:   Verbalizes understanding of the education and information provided today  Self administers medications as prescribed  Attends all scheduled provider appointments  Calls pharmacy for medication refills  Patient Self Care Activities:  . Self administers medications as prescribed . Attends all scheduled provider appointments . Calls pharmacy for medication refills . Attends church or  other social activities . Performs ADL's independently . Performs IADL's independently . Calls provider office for new concerns or questions  Please see past updates related to this goal by clicking on the "Past Updates" button in the selected goal        Plan:   The care management team will reach out to the patient again over the next 30 days.   Provider Signature Regina Eck, PharmD, BCPS Clinical Pharmacist, [facility] [place]: [phone]

## 2019-04-14 NOTE — Patient Instructions (Signed)
Visit Information  Goals Addressed            This Visit's Progress     Patient Stated   . "I would like for my Diabetes to be better managed" (pt-stated)       Current Barriers:  [name] Knowledge Deficits related to Diabetes disease process and Self Health Management  Nurse Case Manager & PharmD Clinical Goal(s):  [name] Over the next 90 days, patient will verbalize basic understanding of Diabetes disease process and self health management plan as evidenced by patient will verbalize understanding of importance to adhering to her diabetic meal plan, report taking her diabetic medications exactly as prescribed and will verbalize knowing when to call the CCM team and or provider if needed for hypo/hyperglycemic events. Goal Met . New - 02/17/19 Over the next 90 days, patient will work with the [place] team for ongoing education on [place] management interventions to help lower her A1C <7.4  CCM RN CM Interventions:  03/27/19 call completed with patient  Inbound call received from patient stating the pharmacy did not receive an Rx from her provider for the Solo star glucose strips . Evaluation of current treatment plan related to Diabetes and patient's adherence to plan as established by provider. . Advised patient to check with her pharmacy later on today to ensure the refill has been completed and or someone from the PCP office and or embedded CCM team will contact her . Reviewed medications with patient and discussed patient is adhering to taking all medications exactly as prescribed . Collaborated with provider [name], FNP regarding patient's request for a refill for her Solo Star glucose strips; discussed this is not formulary for patients insurance and may be expensive; Collaborated with embedded Pharm D [name] requesting information for an alternative glucose strip for [name]; discussed Almyra Free will contact the patient today to advise  . Discussed plans with patient for ongoing  care management follow up and provided patient with direct contact information for care management team . Advised patient, providing education and rationale, to check cbg daily before meals and record, calling the CCM team and or PCP for findings outside established parameters.  <80 and or >250   CCM PharmD Interventions:  Completed CCM PharmD Telephone Follow Up call with patient on 03/28/19  Evaluation of current treatment plan related to diabetes management and patient's adherence to plan as established by provider.  Reports compliance with medications.  Comprehensive medication review performed.  NO changes to medication regimen at this time.  Patient denies hypoglycemia, reports BGs are better controlled.  She has been able to take medication as prescribed due to patient assistance.  She has also been doing better with diet/exercise.  She continues to work part time.  Patient states her FBG ranges from 120-130.  Patient's A1c has been trending down (most recently 7.4% on 01/29/19).   Anticipate lower A1c based on BG readings.  04/14/19-->Patient states she is using a Solus V2 glucometer.  Test strips called in per patient request for twice daily testing.  Test strip RX did not transmit to the pharmacy.  Will re-submit  Encouraged patient to stick with drinking water and avoid sugary drinks.  Patient verbalizes understanding.  Will continue to discuss adjusting medications to achieve optimal control.  Patient is still working part-time and continues to wear her mask in public areas; only shops in the early morning to stay safe  Educated patient on recommendations for carb amount for snacks and meals  Patient Self Care Activities:   Verbalizes understanding of the education and information provided today  Self administers medications as prescribed  Attends all scheduled provider appointments  Calls pharmacy for medication refills  Patient Self Care Activities:  . Self  administers medications as prescribed . Attends all scheduled provider appointments . Calls pharmacy for medication refills . Attends church or other social activities . Performs ADL's independently . Performs IADL's independently . Calls provider office for new concerns or questions  Please see past updates related to this goal by clicking on the "Past Updates" button in the selected goal          The patient verbalized understanding of instructions provided today and declined a print copy of patient instruction materials.   The care management team will reach out to the patient again over the next 30 days.   SIGNATURE Regina Eck, PharmD, BCPS Clinical Pharmacist, [facility] [place]: [phone]

## 2019-04-15 ENCOUNTER — Telehealth: Payer: Self-pay

## 2019-04-15 NOTE — Telephone Encounter (Signed)
I called patient to see if she is ok with [place] sending her test strips [facility] instead because [facility] doesn't have any. I left pt v/m to call the office [facility]

## 2019-04-30 ENCOUNTER — Ambulatory Visit (INDEPENDENT_AMBULATORY_CARE_PROVIDER_SITE_OTHER): Payer: Medicare Other | Admitting: Pharmacist

## 2019-04-30 DIAGNOSIS — E1165 Type 2 diabetes mellitus with hyperglycemia: Secondary | ICD-10-CM | POA: Diagnosis not present

## 2019-05-02 MED ORDER — GLUCOSE BLOOD VI STRP: ORAL_STRIP | 12 refills | Status: DC

## 2019-05-02 MED ORDER — ACCU-CHEK AVIVA DEVI: 0 refills | Status: DC

## 2019-05-02 MED ORDER — ACCU-CHEK FASTCLIX LANCET KIT: PACK | 12 refills | Status: DC

## 2019-05-02 NOTE — Patient Instructions (Signed)
Visit Information  Goals Addressed            This Visit's Progress     Patient Stated   . "I would like for my Diabetes to be better managed" (pt-stated)       Current Barriers:  [name] Knowledge Deficits related to Diabetes disease process and Self Health Management  Nurse Case Manager & PharmD Clinical Goal(s):  [name] Over the next 90 days, patient will verbalize basic understanding of Diabetes disease process and self health management plan as evidenced by patient will verbalize understanding of importance to adhering to her diabetic meal plan, report taking her diabetic medications exactly as prescribed and will verbalize knowing when to call the CCM team and or provider if needed for hypo/hyperglycemic events. Goal Met . New - 02/17/19 Over the next 90 days, patient will work with the [place] team for ongoing education on [place] management interventions to help lower her A1C <7.4  CCM RN CM Interventions:  03/27/19 call completed with patient  Inbound call received from patient stating the pharmacy did not receive an Rx from her provider for the Solo star glucose strips . Evaluation of current treatment plan related to Diabetes and patient's adherence to plan as established by provider. . Advised patient to check with her pharmacy later on today to ensure the refill has been completed and or someone from the PCP office and or embedded CCM team will contact her . Reviewed medications with patient and discussed patient is adhering to taking all medications exactly as prescribed . Collaborated with provider Joanna Brine, FNP regarding patient's request for a refill for her Solo Star glucose strips; discussed this is not formulary for patients insurance and may be expensive; Collaborated with embedded Pharm D Joanna Sandoval requesting information for an alternative glucose strip for Joanna Sandoval; discussed Joanna Sandoval will contact the patient today to advise  . Discussed plans with patient for ongoing  care management follow up and provided patient with direct contact information for care management team . Advised patient, providing education and rationale, to check cbg daily before meals and record, calling the CCM team and or PCP for findings outside established parameters.  <80 and or >250   CCM PharmD Interventions:  Completed CCM PharmD Telephone Follow Up call with patient on 04/30/19  Evaluation of current treatment plan related to diabetes management and patient's adherence to plan as established by provider.  Reports compliance with medications.  Comprehensive medication review performed.  NO changes to medication regimen at this time.  Patient denies hypoglycemia, reports BGs are better controlled.  She has been able to take medication as prescribed due to patient assistance.  She has also been doing better with diet/exercise.  She continues to work part time.  Patient states her FBG ranges from 120-130.  Patient's A1c has been trending down (most recently 7.4% on 01/29/19).   Anticipate lower A1c based on BG readings.  04/14/19-->Patient states she is using a Solus V2 glucometer.  Test strips called in per patient request for twice daily testing.  Solus V2 Meter/strips not available--pt now admits meter is old.  Will try new meter covered by insurance.  Encouraged patient to stick with drinking water and avoid sugary drinks.  Patient verbalizes understanding.  Will continue to discuss adjusting medications to achieve optimal control.  Patient is still working part-time and continues to wear her mask in public areas; only shops in the early morning to stay safe  Educated patient on recommendations for carb  amount for snacks and meals      Patient Self Care Activities:   Verbalizes understanding of the education and information provided today  Self administers medications as prescribed  Attends all scheduled provider appointments  Calls pharmacy for medication refills  Patient  Self Care Activities:  . Self administers medications as prescribed . Attends all scheduled provider appointments . Calls pharmacy for medication refills . Attends church or other social activities . Performs ADL's independently . Performs IADL's independently . Calls provider office for new concerns or questions  Please see past updates related to this goal by clicking on the "Past Updates" button in the selected goal          The patient verbalized understanding of instructions provided today and declined a print copy of patient instruction materials.   The care management team will reach out to the patient again over the next 30 days.   SIGNATURE Joanna Sandoval, PharmD, BCPS Clinical Pharmacist, [facility] [place]: [phone]

## 2019-05-02 NOTE — Progress Notes (Signed)
Chronic Care Management   Visit Note  04/30/2019 Name: Joanna Sandoval MRN: [id number] DOB: [birth year]  Referred by: [name], FNP Reason for referral : Chronic Care Management (Diabetes)   Joanna Sandoval is a 74 y.o. year old female who is a primary care patient of [name], [place]. The CCM team was consulted for assistance with chronic disease management and care coordination needs related to DMII  Review of patient status, including review of consultants reports, relevant laboratory and other test results, and collaboration with appropriate care team members and the patient's provider was performed as part of comprehensive patient evaluation and provision of chronic care management services.    I spoke with Joanna Sandoval by telephone today.  Medications: Outpatient Encounter Medications as of 04/30/2019  Medication Sig  . albuterol (PROVENTIL HFA;VENTOLIN HFA) 108 (90 Base) MCG/ACT inhaler Inhale 2 puffs into the lungs every 6 (six) hours as needed.  [name] atenolol (TENORMIN) 100 MG tablet Take 50 mg by mouth daily.   [name] atorvastatin (LIPITOR) 40 MG tablet Take 1 tablet (40 mg total) by mouth daily. Take 1/2 pill at bedtime  . Blood Glucose Monitoring Suppl (ACCU-CHEK AVIVA) device Use as instructed to test blood sugars twice daily. E11.65  . D3-50 50000 units capsule   . diclofenac sodium (VOLTAREN) 1 % GEL Apply 2 g topically 4 (four) times daily.  [name] glimepiride (AMARYL) 2 MG tablet TAKE 1 AND 1/2 TABLETS BY MOUTH DAILY  . glucose blood test strip AccuChek aviva test strips. Use as instructed to test blood sugar twice daily as directed. E11.65  . [name] 100 MG tablet TAKE 1 TABLET(100 MG) BY MOUTH DAILY  . [name] KIT Use to test blood sugar twice daily as directed. E11.65  . losartan (COZAAR) 50 MG tablet Take 50 mg by mouth daily.  . [DISCONTINUED] glucose blood (SOLUS V2 TEST) test strip Use as instructed to test blood sugar twice daily as  directed. Brand: Solus V2 test strips. DX: E11.9   No facility-administered encounter medications on file as of 04/30/2019.     Objective:   Goals Addressed            This Visit's Progress     Patient Stated   . "I would like for my Diabetes to be better managed" (pt-stated)       Current Barriers:  [name] Knowledge Deficits related to Diabetes disease process and Self Health Management  Nurse Case Manager & PharmD Clinical Goal(s):  [name] Over the next 90 days, patient will verbalize basic understanding of Diabetes disease process and self health management plan as evidenced by patient will verbalize understanding of importance to adhering to her diabetic meal plan, report taking her diabetic medications exactly as prescribed and will verbalize knowing when to call the CCM team and or provider if needed for hypo/hyperglycemic events. Goal Met . New - 02/17/19 Over the next 90 days, patient will work with the [place] team for ongoing education on [place] management interventions to help lower her A1C <7.4  CCM RN CM Interventions:  03/27/19 call completed with patient  Inbound call received from patient stating the pharmacy did not receive an Rx from her provider for the Solo star glucose strips . Evaluation of current treatment plan related to Diabetes and patient's adherence to plan as established by provider. . Advised patient to check with her pharmacy later on today to ensure the refill has been completed and or someone from the PCP office and or  embedded CCM team will contact her . Reviewed medications with patient and discussed patient is adhering to taking all medications exactly as prescribed . Collaborated with provider [name], FNP regarding patient's request for a refill for her Solo Star glucose strips; discussed this is not formulary for patients insurance and may be expensive; Collaborated with embedded Pharm D [name] requesting information for an alternative glucose  strip for Joanna Sandoval; discussed Almyra Free will contact the patient today to advise  . Discussed plans with patient for ongoing care management follow up and provided patient with direct contact information for care management team . Advised patient, providing education and rationale, to check cbg daily before meals and record, calling the CCM team and or PCP for findings outside established parameters.  <80 and or >250   CCM PharmD Interventions:  Completed CCM PharmD Telephone Follow Up call with patient on 04/30/19  Evaluation of current treatment plan related to diabetes management and patient's adherence to plan as established by provider.  Reports compliance with medications.  Comprehensive medication review performed.  NO changes to medication regimen at this time.  Patient denies hypoglycemia, reports BGs are better controlled.  She has been able to take medication as prescribed due to patient assistance.  She has also been doing better with diet/exercise.  She continues to work part time.  Patient states her FBG ranges from 120-130.  Patient's A1c has been trending down (most recently 7.4% on 01/29/19).   Anticipate lower A1c based on BG readings.  04/14/19-->Patient states she is using a Solus V2 glucometer.  Test strips called in per patient request for twice daily testing.  Solus V2 Meter/strips not available--pt now admits meter is old.  Will try new meter covered by insurance.  Encouraged patient to stick with drinking water and avoid sugary drinks.  Patient verbalizes understanding.  Will continue to discuss adjusting medications to achieve optimal control.  Patient is still working part-time and continues to wear her mask in public areas; only shops in the early morning to stay safe  Educated patient on recommendations for carb amount for snacks and meals    **Consider SGLT2 for optimal DM control and benefits. Will address and next visit   Patient Self Care Activities:    Verbalizes understanding of the education and information provided today  Self administers medications as prescribed  Attends all scheduled provider appointments  Calls pharmacy for medication refills  Patient Self Care Activities:  . Self administers medications as prescribed . Attends all scheduled provider appointments . Calls pharmacy for medication refills . Attends church or other social activities . Performs ADL's independently . Performs IADL's independently . Calls provider office for new concerns or questions  Please see past updates related to this goal by clicking on the "Past Updates" button in the selected goal            Plan:   The care management team will reach out to the patient again over the next 30 days.   Provider Signature Regina Eck, PharmD, BCPS Clinical Pharmacist, [facility] [place]: [phone]

## 2019-05-05 ENCOUNTER — Other Ambulatory Visit: Payer: Self-pay

## 2019-05-05 MED ORDER — ACCU-CHEK SOFT TOUCH LANCETS MISC: 12 refills | Status: DC

## 2019-05-05 MED ORDER — ACCU-CHEK GUIDE VI STRP: ORAL_STRIP | 12 refills | Status: DC

## 2019-05-05 MED ORDER — ACCU-CHEK GUIDE W/DEVICE KIT: 1.0000 | PACK | Freq: Two times a day (BID) | 1 refills | Status: DC

## 2019-05-07 ENCOUNTER — Ambulatory Visit: Payer: Self-pay | Admitting: Pharmacist

## 2019-05-07 ENCOUNTER — Other Ambulatory Visit: Payer: Self-pay | Admitting: Pharmacy Technician

## 2019-05-07 DIAGNOSIS — E1165 Type 2 diabetes mellitus with hyperglycemia: Secondary | ICD-10-CM

## 2019-05-07 NOTE — Patient Outreach (Signed)
[facility]) Care Management  05/07/2019  Joanna Sandoval [birth year] [id number]    Follow up call placed to [employer] regarding patient assistance application(s) for [name] and Proventil HFA , Myra confirms that application was received and attestation form mailed out to patient on 05/07/19.   Follow up:  Will send message to [place] to inform.  Maud Deed Chana Bode [place] Certified Pharmacy Technician [place] Management Direct [phone]

## 2019-05-07 NOTE — Addendum Note (Signed)
Addended by: [name] D on: 05/07/2019 03:46 PM   Modules accepted: Orders

## 2019-05-07 NOTE — Progress Notes (Signed)
  Chronic Care Management   Outreach Note  05/07/2019 Name: Joanna Sandoval MRN: [id number] DOB: [birth year]  Referred by: [name], FNP Reason for referral : Chronic Care Management (Diabetes)   An unsuccessful telephone outreach was attempted today. The patient was referred to the case management team by for assistance with care management and care coordination.  Patient's VM box was full and is not accepting messages at this time.  Confirmed with [facility] that patient filled meter, test strips, lancents (AccuCheck GUIDE).  Follow Up Plan: The care management team will reach out to the patient again over the next 30 days.   SIGNATURE Regina Eck, PharmD, BCPS Clinical Pharmacist, [facility] [place]: [phone]

## 2019-05-08 ENCOUNTER — Ambulatory Visit (INDEPENDENT_AMBULATORY_CARE_PROVIDER_SITE_OTHER): Payer: Medicare Other | Admitting: Nurse Practitioner

## 2019-05-08 ENCOUNTER — Other Ambulatory Visit: Payer: Self-pay

## 2019-05-08 ENCOUNTER — Encounter: Payer: Self-pay | Admitting: Nurse Practitioner

## 2019-05-08 VITALS — BP 110/78 | HR 81 | Temp 98.4°F | Ht 60.6 in | Wt 213.4 lb

## 2019-05-08 DIAGNOSIS — E1165 Type 2 diabetes mellitus with hyperglycemia: Secondary | ICD-10-CM

## 2019-05-08 DIAGNOSIS — I10 Essential (primary) hypertension: Secondary | ICD-10-CM | POA: Diagnosis not present

## 2019-05-08 DIAGNOSIS — R208 Other disturbances of skin sensation: Secondary | ICD-10-CM

## 2019-05-08 DIAGNOSIS — E119 Type 2 diabetes mellitus without complications: Secondary | ICD-10-CM | POA: Diagnosis not present

## 2019-05-08 MED ORDER — VALACYCLOVIR HCL 500 MG PO TABS: 500.0000 mg | ORAL_TABLET | Freq: Two times a day (BID) | ORAL | 0 refills | Status: AC

## 2019-05-08 NOTE — Progress Notes (Signed)
Subjective:     Patient ID: Joanna Sandoval , female    DOB: [birth year] , 74 y.o.   MRN: [id number]   Chief Complaint  Patient presents with  . Diabetes  . face concerns    HPI  She reports her son had covid 3 months ago but is doing better.    Wt Readings from Last 3 Encounters: 05/08/19 : 213 lb 6.4 oz (96.8 kg) 01/30/19 : 210 lb 12.8 oz (95.6 kg) 10/30/18 : 204 lb (92.5 kg)   She is planning to get the covid vaccine at her sisters [place].    She is trying to avoid greasy foods and eating more vegetables  She reports having a line on the side of her face, she has burning on that side from the top of her left eye down her face. Has not been a rash. Denies neck pain.    Diabetes She presents for her follow-up diabetic visit. She has type 2 diabetes mellitus. Her disease course has been stable. There are no hypoglycemic associated symptoms. Pertinent negatives for hypoglycemia include no dizziness or headaches. There are no diabetic associated symptoms. Pertinent negatives for diabetes include no fatigue, no polydipsia, no polyphagia and no polyuria. There are no hypoglycemic complications. There are no diabetic complications. Risk factors for coronary artery disease include obesity and sedentary lifestyle. Current diabetic treatment includes oral agent (dual therapy). She is compliant with treatment all of the time. Her weight is stable. When asked about meal planning, she reported none. 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  She has been doing for about a month). There is no change in her home blood glucose trend. (She has not been checking her blood sugar due to not having any lancets.  ) An ACE inhibitor/angiotensin II receptor blocker is being taken. She does not see a podiatrist.Eye exam is not current.  Hypertension This is a chronic problem. The current episode started more than 1 year ago. The problem is unchanged. The  problem is controlled. Pertinent negatives include no anxiety or headaches. There are no associated agents to hypertension. Risk factors for coronary artery disease include obesity, sedentary lifestyle, dyslipidemia and diabetes mellitus. Past treatments include angiotensin blockers. There are no compliance problems.  There is no history of chronic renal disease.     Past Medical History:  Diagnosis Date  . Arthritis   . Diabetes mellitus   . Hypercholesteremia   . Hypertension   . Post-menopausal bleeding      Family History  Problem Relation Age of Onset  . Hypertension Mother   . Hypertension Father   . Cancer Father        ?lung  . Diabetes Sister      Current Outpatient Medications:  .  albuterol (PROVENTIL HFA;VENTOLIN HFA) 108 (90 Base) MCG/ACT inhaler, Inhale 2 puffs into the lungs every 6 (six) hours as needed., Disp: , Rfl:  .  atenolol (TENORMIN) 100 MG tablet, Take 50 mg by mouth daily. , Disp: , Rfl:  .  atorvastatin (LIPITOR) 40 MG tablet, Take 1 tablet (40 mg total) by mouth daily. Take 1/2 pill at bedtime, Disp: 90 tablet, Rfl: 1 .  Blood Glucose Monitoring Suppl (ACCU-CHEK GUIDE) w/Device KIT, 1 Stick by Does not apply route 2 (two) times daily at 10 AM and 5 PM., Disp: 1 kit, Rfl: 1 .  D3-50 50000 units capsule, , Disp: , Rfl: 0 .  diclofenac sodium (VOLTAREN) 1 %  GEL, Apply 2 g topically 4 (four) times daily., Disp: 100 g, Rfl: 1 .  glimepiride (AMARYL) 2 MG tablet, TAKE 1 AND 1/2 TABLETS BY MOUTH DAILY, Disp: 135 tablet, Rfl: 1 .  glucose blood (ACCU-CHEK GUIDE) test strip, Use as instructed, Disp: 100 each, Rfl: 12 .  [name] 100 MG tablet, TAKE 1 TABLET(100 MG) BY MOUTH DAILY, Disp: 90 tablet, Rfl: 1 .  [name] lancets, Use as instructed, Disp: 100 each, Rfl: 12 .  [name] KIT, Use to test blood sugar twice daily as directed. E11.65, Disp: 1 kit, Rfl: 12 .  losartan (COZAAR) 50 MG tablet, Take 50 mg by  mouth daily., Disp: , Rfl:    No Known Allergies   Review of Systems  Constitutional: Negative.  Negative for fatigue.  Respiratory: Negative.   Cardiovascular: Negative.   Endocrine: Negative for polydipsia, polyphagia and polyuria.  Musculoskeletal: Negative.   Skin: Negative.   Neurological: Negative for dizziness and headaches.  Psychiatric/Behavioral: Negative.      Today's Vitals   05/08/19 0900  BP: 110/78  Pulse: 81  Temp: 98.4 F (36.9 C)  TempSrc: Oral  Weight: 213 lb 6.4 oz (96.8 kg)  Height: 5' 0.6" (1.539 m)  PainSc: 0-No pain   Body mass index is 40.86 kg/m.   Objective:  Physical Exam Vitals reviewed.  Constitutional:      General: She is not in acute distress.    Appearance: Normal appearance. She is obese.  Cardiovascular:     Rate and Rhythm: Normal rate and regular rhythm.     Pulses: Normal pulses.     Heart sounds: Normal heart sounds. No murmur.  Pulmonary:     Effort: Pulmonary effort is normal. No respiratory distress.     Breath sounds: Normal breath sounds.  Musculoskeletal:        General: No swelling or tenderness. Normal range of motion.  Skin:    General: Skin is warm and dry.     Capillary Refill: Capillary refill takes less than 2 seconds.     Findings: No erythema, lesion or rash.     Comments: Small line of demarcation to left cheek  Neurological:     General: No focal deficit present.     Mental Status: She is alert and oriented to person, place, and time.     Cranial Nerves: No cranial nerve deficit.  Psychiatric:        Mood and Affect: Mood normal.        Behavior: Behavior normal.        Thought Content: Thought content normal.        Judgment: Judgment normal.         Assessment And Plan:     1. Type 2 diabetes mellitus without complication, without long-term current use of insulin (HCC)  Chronic, fairly controlled.    Continue with current medications pending results of HgbA1c  Encouraged to limit intake of  sugary foods and drinks  Encouraged to increase physical activity to 150 minutes per week as tolerated  Diabetic foot exam done with education on avoiding walking barefoot and regular foot checks, moisturizer - Hemoglobin A1c - CMP14 + Anion Gap - Lipid Profile  2. Essential hypertension . B/P under good controll  . CMP ordered to check renal function.  . The importance of regular exercise and dietary modification was stressed to the patient.  . Stressed importance of losing ten percent of her body weight to  help with B/P control.  . The weight loss would help with decreasing cardiac and cancer risk as well.   3. Burning sensation  Burning to left side of face, I am concerned this may be shingles  No rash present  - valACYclovir (VALTREX) 500 MG tablet; Take 1 tablet (500 mg total) by mouth 2 (two) times daily for 5 days.  Dispense: 10 tablet; Refill: 0   [name], FNP    THE PATIENT IS ENCOURAGED TO PRACTICE SOCIAL DISTANCING DUE TO THE COVID-19 PANDEMIC.

## 2019-05-09 LAB — CMP14+EGFR
ALT: 8 IU/L (ref 0–32)
AST: 13 IU/L (ref 0–40)
Albumin/Globulin Ratio: 1.4 (ref 1.2–2.2)
Albumin: 4.1 g/dL (ref 3.7–4.7)
Alkaline Phosphatase: 120 IU/L — ABNORMAL HIGH (ref 39–117)
BUN/Creatinine Ratio: 20 (ref 12–28)
BUN: 18 mg/dL (ref 8–27)
Bilirubin Total: 0.4 mg/dL (ref 0.0–1.2)
CO2: 27 mmol/L (ref 20–29)
Calcium: 9.5 mg/dL (ref 8.7–10.3)
Chloride: 104 mmol/L (ref 96–106)
Creatinine, Ser: 0.92 mg/dL (ref 0.57–1.00)
GFR calc Af Amer: 72 mL/min/{1.73_m2} (ref >59)
GFR calc non Af Amer: 62 mL/min/{1.73_m2} (ref >59)
Globulin, Total: 3 g/dL (ref 1.5–4.5)
Glucose: 164 mg/dL — ABNORMAL HIGH (ref 65–99)
Potassium: 4.7 mmol/L (ref 3.5–5.2)
Sodium: 145 mmol/L — ABNORMAL HIGH (ref 134–144)
Total Protein: 7.1 g/dL (ref 6.0–8.5)

## 2019-05-09 LAB — LIPID PANEL
Chol/HDL Ratio: 3.5 ratio (ref 0.0–4.4)
Cholesterol, Total: 176 mg/dL (ref 100–199)
HDL: 50 mg/dL (ref >39)
LDL Chol Calc (NIH): 103 mg/dL — ABNORMAL HIGH (ref 0–99)
Triglycerides: 127 mg/dL (ref 0–149)
VLDL Cholesterol Cal: 23 mg/dL (ref 5–40)

## 2019-05-09 LAB — HEMOGLOBIN A1C
Est. average glucose Bld gHb Est-mCnc: 186 mg/dL
Hgb A1c MFr Bld: 8.1 % — ABNORMAL HIGH (ref 4.8–5.6)

## 2019-05-19 ENCOUNTER — Ambulatory Visit: Payer: Self-pay

## 2019-05-19 ENCOUNTER — Telehealth: Payer: Self-pay

## 2019-05-19 DIAGNOSIS — E119 Type 2 diabetes mellitus without complications: Secondary | ICD-10-CM

## 2019-05-19 DIAGNOSIS — I10 Essential (primary) hypertension: Secondary | ICD-10-CM

## 2019-05-19 NOTE — Chronic Care Management (AMB) (Signed)
  Chronic Care Management   Outreach Note  05/19/2019 Name: Joanna Sandoval MRN: [id number] DOB: [birth year]  Referred by: [name], FNP Reason for referral : Chronic Care Management (CCM RNCM Telephone Follow up )   An unsuccessful telephone outreach was attempted today. The patient was referred to the case management team by [name] FNP for assistance with care management and care coordination.   Follow Up Plan: Telephone follow up appointment with care management team member scheduled for: 06/11/19  [name], RN, BSN, CCM Care Management Coordinator [place] [facility]  Direct Phone: [phone]

## 2019-05-20 ENCOUNTER — Other Ambulatory Visit: Payer: Self-pay | Admitting: Pharmacy Technician

## 2019-05-20 ENCOUNTER — Telehealth: Payer: Self-pay

## 2019-05-20 NOTE — Patient Outreach (Signed)
[facility]) Care Management  05/20/2019  Joanna Sandoval 08-23-1945 [id number]    Unsuccessful call placed to patient regarding patient assistance receipt of attestation form from [employer] for [name] and Proventil HFA, HIPAA compliant voicemail left.   Follow up:  Will route note to [place] to inform of call attempt.  Maud Deed Chana Bode [place] Certified Pharmacy Technician [place] Management Direct [phone]

## 2019-05-20 NOTE — Telephone Encounter (Signed)
1st attempt to give lab results  

## 2019-05-20 NOTE — Telephone Encounter (Signed)
-----   Message from [name], [place] sent at 05/15/2019 10:57 PM EST ----- Your kidney and liver functions are essentially normal however you need to drink water.  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 Avoid taking NSAIDs  are you having any issues with your medications?  Cholesterol levels are slightly up be sure to continue taking your atorvastatin daily.  HgbA1c is up to 8.1 from 7.4, avoid sugary foods and drinks.

## 2019-05-23 ENCOUNTER — Ambulatory Visit: Payer: Self-pay | Admitting: Pharmacist

## 2019-05-23 DIAGNOSIS — E119 Type 2 diabetes mellitus without complications: Secondary | ICD-10-CM

## 2019-05-25 ENCOUNTER — Ambulatory Visit: Payer: Medicare Other | Attending: Internal Medicine

## 2019-05-25 DIAGNOSIS — Z23 Encounter for immunization: Secondary | ICD-10-CM | POA: Insufficient documentation

## 2019-05-25 NOTE — Progress Notes (Signed)
   Covid-19 Vaccination Clinic  Name:  Joanna Sandoval    MRN: [id number] DOB: Aug 26, 1945  05/25/2019  Joanna Sandoval was observed post Covid-19 immunization for 15 minutes without incidence. She was provided with Vaccine Information Sheet and instruction to access the V-Safe system.   Joanna Sandoval was instructed to call 911 with any severe reactions post vaccine: [name] Difficulty breathing  . Swelling of your face and throat  . A fast heartbeat  . A bad rash all over your body  . Dizziness and weakness    Immunizations Administered    Name Date Dose VIS Date Route   Pfizer COVID-19 Vaccine 05/25/2019  2:44 PM 0.3 mL 03/28/2019 Intramuscular   Manufacturer: [place]   Lot: [id number]   [place]: [id number]

## 2019-05-26 ENCOUNTER — Ambulatory Visit (INDEPENDENT_AMBULATORY_CARE_PROVIDER_SITE_OTHER): Payer: Medicare Other | Admitting: Pharmacist

## 2019-05-26 DIAGNOSIS — E119 Type 2 diabetes mellitus without complications: Secondary | ICD-10-CM | POA: Diagnosis not present

## 2019-05-26 DIAGNOSIS — I10 Essential (primary) hypertension: Secondary | ICD-10-CM

## 2019-05-26 NOTE — Progress Notes (Signed)
  Chronic Care Management   Outreach Note  05/23/2019 Name: Joanna Sandoval MRN: [id number] DOB: 23-Aug-1945  Referred by: [name], FNP Reason for referral : Chronic Care Management  Successful outreahc to Joanna Sandoval with HIPPA identifiers verified.  Patient states she was denied from patient assistance program.  Will route message to CPhT, Etter Sjogren for assistance.    Follow Up Plan: The care management team will reach out to the patient again over the next 10 days.   SIGNATURE Regina Eck, PharmD, BCPS Clinical Pharmacist, [facility] [place]: [phone]

## 2019-05-27 ENCOUNTER — Other Ambulatory Visit: Payer: Self-pay | Admitting: Pharmacy Technician

## 2019-05-27 NOTE — Patient Outreach (Signed)
[place] [facility]) Care Management  05/27/2019  Joanna Sandoval July 24, 1945 [id number]    Successful call placed to patient regarding patient assistance receipt of attestation form from [employer] for [name] and Proventil HFA, HIPAA identifiers verified. Assisted Joanna Sandoval with completing attestation form. Patient to mail back to [employer] on 2/10.  Follow up:  Will follow up with company in 10-14 business days to check application status  Maud Deed. Chana Bode [place] Certified Pharmacy Technician [place] Management Direct Dial:[phone]

## 2019-05-28 NOTE — Progress Notes (Signed)
  Chronic Care Management   Outreach Note  05/26/2019 Name: Joanna Sandoval MRN: [id number] DOB: April 30, 1945  Referred by: [name], FNP Reason for referral : Chronic Care Management   An unsuccessful telephone outreach was attempted today. The patient was referred to the case management team for assistance with care management and care coordination.   Follow Up Plan: A HIPPA compliant phone message was left for the patient providing contact information and requesting a return call.  The care management team will reach out to the patient again over the next 30 days.   SIGNATURE Regina Eck, PharmD, BCPS Clinical Pharmacist, [facility] [place]: [phone]

## 2019-06-09 ENCOUNTER — Ambulatory Visit: Payer: Self-pay

## 2019-06-09 ENCOUNTER — Other Ambulatory Visit: Payer: Self-pay

## 2019-06-09 ENCOUNTER — Ambulatory Visit (HOSPITAL_COMMUNITY)
Admission: EM | Admit: 2019-06-09 | Discharge: 2019-06-09 | Disposition: A | Discharge status: Home / Self Care | Payer: Medicare Other | Attending: Urgent Care | Admitting: Urgent Care

## 2019-06-09 ENCOUNTER — Encounter (HOSPITAL_COMMUNITY): Payer: Self-pay

## 2019-06-09 DIAGNOSIS — E119 Type 2 diabetes mellitus without complications: Secondary | ICD-10-CM

## 2019-06-09 DIAGNOSIS — M25532 Pain in left wrist: Secondary | ICD-10-CM

## 2019-06-09 DIAGNOSIS — M25432 Effusion, left wrist: Secondary | ICD-10-CM

## 2019-06-09 DIAGNOSIS — M1990 Unspecified osteoarthritis, unspecified site: Secondary | ICD-10-CM

## 2019-06-09 MED ORDER — METHYLPREDNISOLONE SODIUM SUCC 125 MG IJ SOLR
80.0000 mg | Freq: Once | INTRAMUSCULAR | Status: AC
  Administered 2019-06-09: 80 mg via INTRAMUSCULAR

## 2019-06-09 MED ORDER — METHYLPREDNISOLONE SODIUM SUCC 125 MG IJ SOLR
INTRAMUSCULAR | Status: AC
  Filled 2019-06-09: qty 2

## 2019-06-09 MED ORDER — PREDNISONE 10 MG PO TABS: 10.0000 mg | ORAL_TABLET | Freq: Every day | ORAL | 0 refills | Status: DC

## 2019-06-09 MED ORDER — PREDNISONE 10 MG PO TABS: 30.0000 mg | ORAL_TABLET | Freq: Every day | ORAL | 0 refills | Status: DC

## 2019-06-09 NOTE — Discharge Instructions (Addendum)
We will use 80mg  of a steroid injection today. You can start oral prednisone tomorrow for just 3 days.

## 2019-06-09 NOTE — ED Provider Notes (Signed)
Joanna Sandoval   MRN: [id number] DOB: 1946/04/11  Subjective:   Joanna Sandoval is a 74 y.o. female presenting for 2-day history of acute onset persistent and worsening left wrist pain and swelling.  She has had decreased range of motion.  Denies fall, trauma.  Denies history of gout.  Patient is a diabetic, last A1c was 8.1%.  She also has a history of hypertension, managed well with medication and diet.  Patient has a history of significant arthritis in her knees.  She received her Covid vaccine about 3 weeks ago and is due for another in early March.   Current Facility-Administered Medications:  .  methylPREDNISolone sodium succinate (SOLU-MEDROL) 125 mg/2 mL injection 80 mg, 80 mg, Intramuscular, Once, Jaynee Eagles, PA-C  Current Outpatient Medications:  .  albuterol (PROVENTIL HFA;VENTOLIN HFA) 108 (90 Base) MCG/ACT inhaler, Inhale 2 puffs into the lungs every 6 (six) hours as needed., Disp: , Rfl:  .  atenolol (TENORMIN) 100 MG tablet, Take 50 mg by mouth daily. , Disp: , Rfl:  .  atorvastatin (LIPITOR) 40 MG tablet, Take 1 tablet (40 mg total) by mouth daily. Take 1/2 pill at bedtime, Disp: 90 tablet, Rfl: 1 .  Blood Glucose Monitoring Suppl (ACCU-CHEK GUIDE) w/Device KIT, 1 Stick by Does not apply route 2 (two) times daily at 10 AM and 5 PM., Disp: 1 kit, Rfl: 1 .  D3-50 50000 units capsule, , Disp: , Rfl: 0 .  diclofenac sodium (VOLTAREN) 1 % GEL, Apply 2 g topically 4 (four) times daily., Disp: 100 g, Rfl: 1 .  glimepiride (AMARYL) 2 MG tablet, TAKE 1 AND 1/2 TABLETS BY MOUTH DAILY, Disp: 135 tablet, Rfl: 1 .  glucose blood (ACCU-CHEK GUIDE) test strip, Use as instructed, Disp: 100 each, Rfl: 12 .  [name] 100 MG tablet, TAKE 1 TABLET(100 MG) BY MOUTH DAILY, Disp: 90 tablet, Rfl: 1 .  [name] lancets, Use as instructed, Disp: 100 each, Rfl: 12 .  [name] KIT, Use to test blood sugar twice daily as directed. E11.65, Disp:  1 kit, Rfl: 12 .  losartan (COZAAR) 50 MG tablet, Take 50 mg by mouth daily., Disp: , Rfl:  .  predniSONE (DELTASONE) 10 MG tablet, Take 3 tablets (30 mg total) by mouth daily with breakfast., Disp: 9 tablet, Rfl: 0   No Known Allergies  Past Medical History:  Diagnosis Date  . Arthritis   . Diabetes mellitus   . Hypercholesteremia   . Hypertension   . Post-menopausal bleeding      Past Surgical History:  Procedure Laterality Date  . CATARACT EXTRACTION    . DILATION AND CURETTAGE OF UTERUS    . HYSTEROSCOPY WITH D & C N/A 05/02/2017   Procedure: DILATATION AND CURETTAGE /HYSTEROSCOPY;  Surgeon: Mora Bellman, MD;  Location: [place];  Service: Gynecology;  Laterality: N/A;  . KNEE ARTHROSCOPY      Family History  Problem Relation Age of Onset  . Hypertension Mother   . Hypertension Father   . Cancer Father        ?lung  . Diabetes Sister     Social History   Tobacco Use  . Smoking status: Former Smoker    Types: Cigarettes  . Smokeless tobacco: Never Used  Substance Use Topics  . Alcohol use: No  . Drug use: No    ROS   Objective:   Vitals: BP (!) 150/72 (BP Location: Right Arm)   Pulse 96  Temp 99.2 F (37.3 C) (Oral)   Resp 16   SpO2 100%   Physical Exam Constitutional:      General: She is not in acute distress.    Appearance: Normal appearance. She is well-developed. She is not ill-appearing, toxic-appearing or diaphoretic.  HENT:     Head: Normocephalic and atraumatic.     Nose: Nose normal.     Mouth/Throat:     Mouth: Mucous membranes are moist.     Pharynx: Oropharynx is clear.  Eyes:     General: No scleral icterus.    Extraocular Movements: Extraocular movements intact.     Pupils: Pupils are equal, round, and reactive to light.  Cardiovascular:     Rate and Rhythm: Normal rate.  Pulmonary:     Effort: Pulmonary effort is normal.  Musculoskeletal:     Left wrist: Swelling and tenderness (worse over ulnar aspect  of wrist) present. No deformity, effusion, lacerations, bony tenderness, snuff box tenderness or crepitus. Decreased range of motion.  Skin:    General: Skin is warm and dry.  Neurological:     General: No focal deficit present.     Mental Status: She is alert and oriented to person, place, and time.  Psychiatric:        Mood and Affect: Mood normal.        Behavior: Behavior normal.        Thought Content: Thought content normal.        Judgment: Judgment normal.     Assessment and Plan :   1. Acute pain of left wrist   2. Pain and swelling of left wrist   3. Well controlled type 2 diabetes mellitus ([place])   4. Arthritis     Case discussed with Dr. Meda Coffee.  Will use IM Solu-Medrol, oral prednisone course for the next 3 days.  Recommended follow-up with orthopedist if her symptoms persist.  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 Counseled patient on potential for adverse effects with medications prescribed/recommended today, ER and return-to-clinic precautions discussed, patient verbalized understanding.    Jaynee Eagles, [place] 06/10/19 [phone]

## 2019-06-09 NOTE — ED Triage Notes (Signed)
Patient presents to Urgent Care with complaints of left wrist pain and swelling since last night. Patient reports the pain is worse on the ulnar side, denies known injury.

## 2019-06-10 ENCOUNTER — Other Ambulatory Visit: Payer: Self-pay | Admitting: Pharmacy Technician

## 2019-06-10 NOTE — Patient Outreach (Signed)
[place] [facility]) Care Management  06/10/2019  Jacynda Klinger 13-Oct-1945 [id number]    Follow up call placed to [employer] regarding patient assistance application(s) for [name] and Proventil HFA , [place] confirms patient has been approved as of 2/16 until 04/16/20.   Follow up:  Will follow up with patient in the next 7 business days to confirm medications have been received.  Maud Deed Chana Bode [place] Certified Pharmacy Technician [place] Management Direct [phone]

## 2019-06-11 ENCOUNTER — Telehealth: Payer: Self-pay

## 2019-06-12 ENCOUNTER — Other Ambulatory Visit: Payer: Self-pay | Admitting: Pharmacy Technician

## 2019-06-12 NOTE — Patient Outreach (Signed)
[place] [facility]) Care Management  06/12/2019  Ayleth Stelzner 11/17/1945 [id number]   Incoming call from patient checking status of [employer] application for [employer] and Proventil HFA. Informed patient that she has been approved until 04/16/2020. Provided ger with [facility] pharmacy contact information to call and set up shipment.  Return call from patient stating that [facility] will be shipping her medications to her and she should receive them no later than 06/16/2019.  Requested that she contact me if medication does not arrive and/or if she has any issues obtaining refills in the future.  Will route note to [place] to inform and will remove myself from care team.  Maud Deed. Chana Bode [place] Certified Pharmacy Technician [place] Management Direct [phone]

## 2019-06-18 ENCOUNTER — Ambulatory Visit: Payer: Medicare Other | Attending: Internal Medicine

## 2019-06-18 ENCOUNTER — Ambulatory Visit: Payer: Medicare Other

## 2019-06-18 DIAGNOSIS — Z23 Encounter for immunization: Secondary | ICD-10-CM | POA: Insufficient documentation

## 2019-06-18 NOTE — Progress Notes (Signed)
   Covid-19 Vaccination Clinic  Name:  Joanna Sandoval    MRN: [id number] DOB: April 09, 1946  06/18/2019  Ms. Schettino was observed post Covid-19 immunization for 15 minutes without incident. She was provided with Vaccine Information Sheet and instruction to access the V-Safe system.   Ms. Griesbach was instructed to call 911 with any severe reactions post vaccine: [name] Difficulty breathing  . Swelling of face and throat  . A fast heartbeat  . A bad rash all over body  . Dizziness and weakness   Immunizations Administered    Name Date Dose VIS Date Route   Pfizer COVID-19 Vaccine 06/18/2019  2:16 PM 0.3 mL 03/28/2019 Intramuscular   Manufacturer: [place]   Lot: [id number]   [place]: [id number]

## 2019-06-26 ENCOUNTER — Encounter: Payer: PPO | Admitting: Nurse Practitioner

## 2019-06-26 ENCOUNTER — Ambulatory Visit: Payer: PPO

## 2019-07-07 ENCOUNTER — Ambulatory Visit: Payer: Self-pay | Admitting: Pharmacist

## 2019-07-07 ENCOUNTER — Telehealth: Payer: Self-pay

## 2019-07-07 ENCOUNTER — Ambulatory Visit (INDEPENDENT_AMBULATORY_CARE_PROVIDER_SITE_OTHER): Payer: Medicare Other

## 2019-07-07 ENCOUNTER — Other Ambulatory Visit: Payer: Self-pay

## 2019-07-07 DIAGNOSIS — E119 Type 2 diabetes mellitus without complications: Secondary | ICD-10-CM | POA: Diagnosis not present

## 2019-07-07 DIAGNOSIS — I10 Essential (primary) hypertension: Secondary | ICD-10-CM

## 2019-07-08 ENCOUNTER — Ambulatory Visit: Payer: Self-pay

## 2019-07-08 ENCOUNTER — Telehealth: Payer: Self-pay

## 2019-07-08 DIAGNOSIS — I10 Essential (primary) hypertension: Secondary | ICD-10-CM

## 2019-07-08 DIAGNOSIS — E119 Type 2 diabetes mellitus without complications: Secondary | ICD-10-CM | POA: Diagnosis not present

## 2019-07-08 NOTE — Patient Instructions (Signed)
Visit Information  Goals Addressed      Patient Stated   . "I would like for my Diabetes to be better managed" (pt-stated)       Current Barriers:  [name] Knowledge Deficits related to Diabetes disease process and Self Health Management  Nurse Case Manager & PharmD Clinical Goal(s):  [name] Over the next 90 days, patient will verbalize basic understanding of Diabetes disease process and self health management plan as evidenced by patient will verbalize understanding of importance to adhering to her diabetic meal plan, report taking her diabetic medications exactly as prescribed and will verbalize knowing when to call the CCM team and or provider if needed for hypo/hyperglycemic events. Goal Met . New - 02/17/19 Over the next 90 days, patient will work with the [place] team for ongoing education on [place] management interventions to help lower her A1C <7.4  CCM RN CM Interventions:  07/07/19 Inbound call received from patient  . Inbound call received from patient stating she received a cortisone injection to help with pain management and it raised her BS to 400 . Evaluation of current treatment plan related to Diabetes and patient's adherence to plan as established by provider . Reviewed medications with patient and discussed patient is adhering to taking all medications exactly as prescribed . Instructed patient to drink plenty of water and recheck her BS when possible; patient verbalizes understanding; Determined patient is at work and does not have her glucometer at this time but will increase her water intake and recheck her BS once she returns home; patient agrees to call the CCM RN with an update on her BS once able to recheck it   . Education provided on the effects of steroid treatment on DM and reiterated importance of adhering to low carb diet and monitor BS more closely for up to 2 weeks after receiving this therapy . Discussed plans with patient for ongoing care management follow up and  provided patient with direct contact information for care management team . Advised patient, providing education and rationale, to check cbg daily before meals and record, calling the CCM team and or PCP for findings outside established parameters. FBS 80-130; after mealtime BS <190  07/07/19 Inbound call from patient  . Received voice message from patient stating her BS is down to 134 and she is feeling well  CCM PharmD Interventions:  Completed CCM PharmD Telephone Follow Up call with patient on 04/30/19  Evaluation of current treatment plan related to diabetes management and patient's adherence to plan as established by provider.  Reports compliance with medications.  Comprehensive medication review performed.  NO changes to medication regimen at this time.  Patient denies hypoglycemia, reports BGs are better controlled.  She has been able to take medication as prescribed due to patient assistance.  She has also been doing better with diet/exercise.  She continues to work part time.  Patient states her FBG ranges from 120-130.  Patient's A1c has been trending down (most recently 7.4% on 01/29/19).   Anticipate lower A1c based on BG readings.  04/14/19-->Patient states she is using a Solus V2 glucometer.  Test strips called in per patient request for twice daily testing.  Solus V2 Meter/strips not available--pt now admits meter is old.  Will try new meter covered by insurance.  Encouraged patient to stick with drinking water and avoid sugary drinks.  Patient verbalizes understanding.  Will continue to discuss adjusting medications to achieve optimal control.  Patient is still working part-time and continues  to wear her mask in public areas; only shops in the early morning to stay safe  Educated patient on recommendations for carb amount for snacks and meals    **Consider SGLT2 for optimal DM control and benefits. Will address and next visit   Patient Self Care Activities:   Verbalizes  understanding of the education and information provided today  Self administers medications as prescribed  Attends all scheduled provider appointments  Calls pharmacy for medication refills  Patient Self Care Activities:  . Patient verbalizes understanding to call the CCM team and or PCP to report ongoing hyperglycemic events following Cortisone injection  . Self administers medications as prescribed . Attends all scheduled provider appointments . Calls pharmacy for medication refills . Performs ADL's independently . Performs IADL's independently . Calls provider office for new concerns or questions  Please see past updates related to this goal by clicking on the "Past Updates" button in the selected goal        Patient verbalizes understanding of instructions provided today.   Telephone follow up appointment with care management team member scheduled for: 07/08/19  [name], RN, BSN, CCM Care Management Coordinator [place] [facility]  Direct Phone: [phone]

## 2019-07-08 NOTE — Patient Instructions (Signed)
Visit Information  Goals Addressed      Patient Stated   . "I would like for my Diabetes to be better managed" (pt-stated)       Current Barriers:  [name] Knowledge Deficits related to Diabetes disease process and Self Health Management  Nurse Case Manager & PharmD Clinical Goal(s):  [name] Over the next 90 days, patient will verbalize basic understanding of Diabetes disease process and self health management plan as evidenced by patient will verbalize understanding of importance to adhering to her diabetic meal plan, report taking her diabetic medications exactly as prescribed and will verbalize knowing when to call the CCM team and or provider if needed for hypo/hyperglycemic events. Goal Met . New - 02/17/19 Over the next 90 days, patient will work with the [facility] team for ongoing education on [place] management interventions to help lower her A1C <7.4 - Goal Not Met . New - 07/08/19 Over the next 90 days, patient will be able to verbalize how to Self management symptoms of Hyper and Hypoglycemia . New - 07/08/19 Over the next 90 days, patient will experience having no complications including no ED visits, and or IP events secondary to DM  CCM RN CM Interventions:  07/07/19 Inbound call received from patient  . Inbound call received from patient stating she received a cortisone injection to help with pain management and it raised her BS to 400 . Evaluation of current treatment plan related to Diabetes and patient's adherence to plan as established by provider . Reviewed medications with patient and discussed patient is adhering to taking all medications exactly as prescribed . Instructed patient to drink plenty of water and recheck her BS when possible; patient verbalizes understanding; Determined patient is at work and does not have her glucometer at this time but will increase her water intake and recheck her BS once she returns home; patient agrees to call the CCM RN with an update on her BS once  able to recheck it   . Education provided on the effects of steroid treatment on DM and reiterated importance of adhering to low carb diet and monitor BS more closely for up to 2 weeks after receiving this therapy . Discussed plans with patient for ongoing care management follow up and provided patient with direct contact information for care management team . Advised patient, providing education and rationale, to check cbg daily before meals and record, calling the CCM team and or PCP for findings outside established parameters. FBS 80-130; after mealtime BS <190 07/07/19 Inbound call from patient  . Received voice message from patient stating her BS is down to 134 and she is feeling well 07/08/19 Placed outbound follow up call to patient . Determined patient's FBS this am was 199; patient states she is asymptomatic of hyperglycemia and will recheck her BS after finishing work . Reiterated importance of taking diabetic medications exactly as prescribed, adhering to low carb, diabetic friendly diet and drinking plenty of water . Encouraged patient to eat an earlier diet avoiding complex carbohydrates and or if she prefers a bedtime snack to eat a low carb heart healthy snack (reviewed food groups with patient)  . Reiterated importance of closely monitoring CBG's at home and notifying the CCM team and or PCP promptly of BS continues to exceed given parameters and or if her Self health interventions do on lower BS  . Discussed plans with patient for ongoing care management follow up and provided patient with direct contact information for care management team .  Printed DM educational materials to be mailed to patient; Diabetes Management Using Meal Planning; Diabetes Safety Zone Tool; Carb Counting; Carb Choices; Hypoglycemia  CCM PharmD Interventions:  Completed CCM PharmD Telephone Follow Up call with patient on 04/30/19  Evaluation of current treatment plan related to diabetes management and  patient's adherence to plan as established by provider.  Reports compliance with medications.  Comprehensive medication review performed.  NO changes to medication regimen at this time.  Patient denies hypoglycemia, reports BGs are better controlled.  She has been able to take medication as prescribed due to patient assistance.  She has also been doing better with diet/exercise.  She continues to work part time.  Patient states her FBG ranges from 120-130.  Patient's A1c has been trending down (most recently 7.4% on 01/29/19).   Anticipate lower A1c based on BG readings.  04/14/19-->Patient states she is using a Solus V2 glucometer.  Test strips called in per patient request for twice daily testing.  Solus V2 Meter/strips not available--pt now admits meter is old.  Will try new meter covered by insurance.  Encouraged patient to stick with drinking water and avoid sugary drinks.  Patient verbalizes understanding.  Will continue to discuss adjusting medications to achieve optimal control.  Patient is still working part-time and continues to wear her mask in public areas; only shops in the early morning to stay safe  Educated patient on recommendations for carb amount for snacks and meals    **Consider SGLT2 for optimal DM control and benefits. Will address and next visit   Patient Self Care Activities:   Verbalizes understanding of the education and information provided today  Self administers medications as prescribed  Attends all scheduled provider appointments  Calls pharmacy for medication refills  Patient Self Care Activities:  . Patient verbalizes understanding to call the CCM team and or PCP to report ongoing hyperglycemic events following Cortisone injection  . Self administers medications as prescribed . Attends all scheduled provider appointments . Calls pharmacy for medication refills . Performs ADL's independently . Performs IADL's independently . Calls provider office for  new concerns or questions  Please see past updates related to this goal by clicking on the "Past Updates" button in the selected goal          Patient verbalizes understanding of instructions provided today.   Telephone follow up appointment with care management team member scheduled for: 07/29/19  [name], RN, BSN, CCM Care Management Coordinator [facility]  Direct Phone: [phone]

## 2019-07-08 NOTE — Chronic Care Management (AMB) (Signed)
Chronic Care Management   Follow Up Note   07/07/2019 Name: Joanna Sandoval MRN: [id number] DOB: 03/25/1946  Referred by: [name], [name], FNP Reason for referral : Chronic Care Management ([facility] Inbound Call )   Joanna Sandoval is a 74 y.o. year old female who is a primary care patient of [name], [name], FNP. The CCM team was consulted for assistance with chronic disease management and care coordination needs.    Review of patient status, including review of consultants reports, relevant laboratory and other test results, and collaboration with appropriate care team members and the patient's provider was performed as part of comprehensive patient evaluation and provision of chronic care management services.    SDOH (Social Determinants of Health) assessments performed: No See Care Plan activities for detailed interventions related to SDOH)   Inbound call received from patient to report elevated blood glucose level.     Outpatient Encounter Medications as of 07/07/2019  Medication Sig  . glimepiride (AMARYL) 2 MG tablet TAKE 1 AND 1/2 TABLETS BY MOUTH DAILY  . glucose blood (ACCU-CHEK GUIDE) test strip Use as instructed  . [name] 100 MG tablet TAKE 1 TABLET(100 MG) BY MOUTH DAILY  . albuterol (PROVENTIL HFA;VENTOLIN HFA) 108 (90 Base) MCG/ACT inhaler Inhale 2 puffs into the lungs every 6 (six) hours as needed.  . atenolol (TENORMIN) 100 MG tablet Take 50 mg by mouth daily.   . atorvastatin (LIPITOR) 40 MG tablet Take 1 tablet (40 mg total) by mouth daily. Take 1/2 pill at bedtime  . Blood Glucose Monitoring Suppl (ACCU-CHEK GUIDE) w/Device KIT 1 Stick by Does not apply route 2 (two) times daily at 10 AM and 5 PM.  . D3-50 50000 units capsule   . diclofenac sodium (VOLTAREN) 1 % GEL Apply 2 g topically 4 (four) times daily.  . [name] lancets Use as instructed  . [name] KIT Use to test blood sugar twice daily as directed. E11.65  .  losartan (COZAAR) 50 MG tablet Take 50 mg by mouth daily.  . predniSONE (DELTASONE) 10 MG tablet Take 3 tablets (30 mg total) by mouth daily with breakfast. (Patient not taking: Reported on 07/07/2019)   No facility-administered encounter medications on file as of 07/07/2019.     Objective:  Lab Results  Component Value Date   HGBA1C 8.1 (H) 05/08/2019   HGBA1C 7.4 (H) 01/30/2019   HGBA1C 7.3 (H) 10/30/2018   Lab Results  Component Value Date   MICROALBUR 10 06/19/2018   LDLCALC 103 (H) 05/08/2019   CREATININE 0.92 05/08/2019   BP Readings from Last 3 Encounters:  06/09/19 (!) 150/72  05/08/19 110/78  01/30/19 124/60    Goals Addressed      Patient Stated   . "I would like for my Diabetes to be better managed" (pt-stated)       Current Barriers:  . Knowledge Deficits related to Diabetes disease process and Self Health Management  Nurse Case Manager & PharmD Clinical Goal(s):  . Over the next 90 days, patient will verbalize basic understanding of Diabetes disease process and self health management plan as evidenced by patient will verbalize understanding of importance to adhering to her diabetic meal plan, report taking her diabetic medications exactly as prescribed and will verbalize knowing when to call the CCM team and or provider if needed for hypo/hyperglycemic events. Goal Met . New - 02/17/19 Over the next 90 days, patient will work with the CCM team for ongoing education on Self Health   management interventions to help lower her A1C <7.4  CCM RN CM Interventions:  07/07/19 Inbound call received from patient  . Inbound call received from patient stating she received a cortisone injection to help with pain management and it raised her BS to 400 . Evaluation of current treatment plan related to Diabetes and patient's adherence to plan as established by provider . Reviewed medications with patient and discussed patient is adhering to taking all medications exactly as  prescribed . Instructed patient to drink plenty of water and recheck her BS when possible; patient verbalizes understanding; Determined patient is at work and does not have her glucometer at this time but will increase her water intake and recheck her BS once she returns home; patient agrees to call the CCM RN with an update on her BS once able to recheck it   . Education provided on the effects of steroid treatment on DM and reiterated importance of adhering to low carb diet and monitor BS more closely for up to 2 weeks after receiving this therapy . Discussed plans with patient for ongoing care management follow up and provided patient with direct contact information for care management team . Advised patient, providing education and rationale, to check cbg daily before meals and record, calling the CCM team and or PCP for findings outside established parameters. FBS 80-130; after mealtime BS <190  07/07/19 Inbound call from patient  . Received voice message from patient stating her BS is down to 134 and she is feeling well  CCM PharmD Interventions:  Completed CCM PharmD Telephone Follow Up call with patient on 04/30/19  Evaluation of current treatment plan related to diabetes management and patient's adherence to plan as established by provider.  Reports compliance with medications.  Comprehensive medication review performed.  NO changes to medication regimen at this time.  Patient denies hypoglycemia, reports BGs are better controlled.  She has been able to take medication as prescribed due to patient assistance.  She has also been doing better with diet/exercise.  She continues to work part time.  Patient states her FBG ranges from 120-130.  Patient's A1c has been trending down (most recently 7.4% on 01/29/19).   Anticipate lower A1c based on BG readings.  04/14/19-->Patient states she is using a Solus V2 glucometer.  Test strips called in per patient request for twice daily testing.  Solus V2  Meter/strips not available--pt now admits meter is old.  Will try new meter covered by insurance.  Encouraged patient to stick with drinking water and avoid sugary drinks.  Patient verbalizes understanding.  Will continue to discuss adjusting medications to achieve optimal control.  Patient is still working part-time and continues to wear her mask in public areas; only shops in the early morning to stay safe  Educated patient on recommendations for carb amount for snacks and meals    **Consider SGLT2 for optimal DM control and benefits. Will address and next visit   Patient Self Care Activities:   Verbalizes understanding of the education and information provided today  Self administers medications as prescribed  Attends all scheduled provider appointments  Calls pharmacy for medication refills  Patient Self Care Activities:  . Patient verbalizes understanding to call the CCM team and or PCP to report ongoing hyperglycemic events following Cortisone injection  . Self administers medications as prescribed . Attends all scheduled provider appointments . Calls pharmacy for medication refills . Performs ADL's independently . Performs IADL's independently . Calls provider office for new concerns or questions    Please see past updates related to this goal by clicking on the "Past Updates" button in the selected goal         Plan:   Telephone follow up appointment with care management team member scheduled for: 07/08/19  Angel Little, RN, BSN, CCM Care Management Coordinator [facility]  Direct Phone: [phone]    

## 2019-07-08 NOTE — Chronic Care Management (AMB) (Signed)
Chronic Care Management   Follow Up Note   07/08/2019 Name: Joanna Sandoval MRN: [id number] DOB: 04-28-1945  Referred by: [name], FNP Reason for referral : Chronic Care Management ([facility] Call DM/Hyperlipidemia)   Joanna Sandoval is a 74 y.o. year old female who is a primary care patient of [name], [place]. The CCM team was consulted for assistance with chronic disease management and care coordination needs.    Review of patient status, including review of consultants reports, relevant laboratory and other test results, and collaboration with appropriate care team members and the patient's provider was performed as part of comprehensive patient evaluation and provision of chronic care management services.    SDOH (Social Determinants of Health) assessments performed: No See Care Plan activities for detailed interventions related to [place])   Placed outbound call to patient for a CCM RN CM DM update.     Outpatient Encounter Medications as of 07/08/2019  Medication Sig  . albuterol (PROVENTIL HFA;VENTOLIN HFA) 108 (90 Base) MCG/ACT inhaler Inhale 2 puffs into the lungs every 6 (six) hours as needed.  [name] atenolol (TENORMIN) 100 MG tablet Take 50 mg by mouth daily.   [name] atorvastatin (LIPITOR) 40 MG tablet Take 1 tablet (40 mg total) by mouth daily. Take 1/2 pill at bedtime  . Blood Glucose Monitoring Suppl (ACCU-CHEK GUIDE) w/Device KIT 1 Stick by Does not apply route 2 (two) times daily at 10 AM and 5 PM.  . D3-50 50000 units capsule   . diclofenac sodium (VOLTAREN) 1 % GEL Apply 2 g topically 4 (four) times daily.  [name] glimepiride (AMARYL) 2 MG tablet TAKE 1 AND 1/2 TABLETS BY MOUTH DAILY  . glucose blood (ACCU-CHEK GUIDE) test strip Use as instructed  . [name] 100 MG tablet TAKE 1 TABLET(100 MG) BY MOUTH DAILY  . [name] lancets Use as instructed  . [name] KIT Use to test blood sugar twice daily as directed. E11.65  .  losartan (COZAAR) 50 MG tablet Take 50 mg by mouth daily.  . predniSONE (DELTASONE) 10 MG tablet Take 3 tablets (30 mg total) by mouth daily with breakfast. (Patient not taking: Reported on 07/07/2019)   No facility-administered encounter medications on file as of 07/08/2019.     Objective:  Lab Results  Component Value Date   HGBA1C 8.1 (H) 05/08/2019   HGBA1C 7.4 (H) 01/30/2019   HGBA1C 7.3 (H) 10/30/2018   Lab Results  Component Value Date   MICROALBUR 10 06/19/2018   LDLCALC 103 (H) 05/08/2019   CREATININE 0.92 05/08/2019   BP Readings from Last 3 Encounters:  06/09/19 (!) 150/72  05/08/19 110/78  01/30/19 124/60   Goals Addressed      Patient Stated   . "I would like for my Diabetes to be better managed" (pt-stated)       Current Barriers:  [name] Knowledge Deficits related to Diabetes disease process and Self Health Management  Nurse Case Manager & PharmD Clinical Goal(s):  [name] Over the next 90 days, patient will verbalize basic understanding of Diabetes disease process and self health management plan as evidenced by patient will verbalize understanding of importance to adhering to her diabetic meal plan, report taking her diabetic medications exactly as prescribed and will verbalize knowing when to call the CCM team and or provider if needed for hypo/hyperglycemic events. Goal Met . New - 02/17/19 Over the next 90 days, patient will work with the [facility] [place] team for ongoing education on [place]  interventions to help lower her A1C <7.4 - Goal Not Met . New - 07/08/19 Over the next 90 days, patient will be able to verbalize how to Self management symptoms of Hyper and Hypoglycemia . New - 07/08/19 Over the next 90 days, patient will experience having no complications including no ED visits, and or IP events secondary to DM  CCM RN CM Interventions:  07/07/19 Inbound call received from patient  . Inbound call received from patient stating she received a cortisone injection  to help with pain management and it raised her BS to 400 . Evaluation of current treatment plan related to Diabetes and patient's adherence to plan as established by provider . Reviewed medications with patient and discussed patient is adhering to taking all medications exactly as prescribed . Instructed patient to drink plenty of water and recheck her BS when possible; patient verbalizes understanding; Determined patient is at work and does not have her glucometer at this time but will increase her water intake and recheck her BS once she returns home; patient agrees to call the CCM RN with an update on her BS once able to recheck it   . Education provided on the effects of steroid treatment on DM and reiterated importance of adhering to low carb diet and monitor BS more closely for up to 2 weeks after receiving this therapy . Discussed plans with patient for ongoing care management follow up and provided patient with direct contact information for care management team . Advised patient, providing education and rationale, to check cbg daily before meals and record, calling the CCM team and or PCP for findings outside established parameters. FBS 80-130; after mealtime BS <190 07/07/19 Inbound call from patient  . Received voice message from patient stating her BS is down to 134 and she is feeling well 07/08/19 Placed outbound follow up call to patient . Determined patient's FBS this am was 199; patient states she is asymptomatic of hyperglycemia and will recheck her BS after finishing work . Reiterated importance of taking diabetic medications exactly as prescribed, adhering to low carb, diabetic friendly diet and drinking plenty of water . Encouraged patient to eat an earlier diet avoiding complex carbohydrates and or if she prefers a bedtime snack to eat a low carb heart healthy snack (reviewed food groups with patient)  . Reiterated importance of closely monitoring CBG's at home and notifying the CCM  team and or PCP promptly of BS continues to exceed given parameters and or if her Self health interventions do on lower BS  . Discussed plans with patient for ongoing care management follow up and provided patient with direct contact information for care management team . Printed DM educational materials to be mailed to patient; Diabetes Management Using Meal Planning; Diabetes Safety Zone Tool; Carb Counting; Carb Choices; Hypoglycemia  CCM PharmD Interventions:  Completed CCM PharmD Telephone Follow Up call with patient on 04/30/19  Evaluation of current treatment plan related to diabetes management and patient's adherence to plan as established by provider.  Reports compliance with medications.  Comprehensive medication review performed.  NO changes to medication regimen at this time.  Patient denies hypoglycemia, reports BGs are better controlled.  She has been able to take medication as prescribed due to patient assistance.  She has also been doing better with diet/exercise.  She continues to work part time.  Patient states her FBG ranges from 120-130.  Patient's A1c has been trending down (most recently 7.4% on 01/29/19).   Anticipate lower A1c  based on BG readings.  04/14/19-->Patient states she is using a Solus V2 glucometer.  Test strips called in per patient request for twice daily testing.  Solus V2 Meter/strips not available--pt now admits meter is old.  Will try new meter covered by insurance.  Encouraged patient to stick with drinking water and avoid sugary drinks.  Patient verbalizes understanding.  Will continue to discuss adjusting medications to achieve optimal control.  Patient is still working part-time and continues to wear her mask in public areas; only shops in the early morning to stay safe  Educated patient on recommendations for carb amount for snacks and meals    **Consider SGLT2 for optimal DM control and benefits. Will address and next visit   Patient Self Care  Activities:   Verbalizes understanding of the education and information provided today  Self administers medications as prescribed  Attends all scheduled provider appointments  Calls pharmacy for medication refills  Patient Self Care Activities:  . Patient verbalizes understanding to call the CCM team and or PCP to report ongoing hyperglycemic events following Cortisone injection  . Self administers medications as prescribed . Attends all scheduled provider appointments . Calls pharmacy for medication refills . Performs ADL's independently . Performs IADL's independently . Calls provider office for new concerns or questions  Please see past updates related to this goal by clicking on the "Past Updates" button in the selected goal        Plan:   Telephone follow up appointment with care management team member scheduled for: 07/29/19  [name], RN, BSN, CCM Care Management Coordinator [place] [facility]  Direct Phone: [phone]

## 2019-07-10 NOTE — Progress Notes (Signed)
  Chronic Care Management   Outreach Note  07/07/2019 Name: Joanna Sandoval MRN: [id number] DOB: [birth year]  Referred by: [name], FNP Reason for referral : Chronic Care Management and Diabetes   An unsuccessful telephone outreach was attempted today. The patient was referred to the case management team for assistance with care management and care coordination.   Follow Up Plan: A HIPPA compliant phone message was left for the patient providing contact information and requesting a return call.   SIGNATURE Regina Eck, PharmD, BCPS Clinical Pharmacist, [facility] [place]: [phone]

## 2019-07-29 ENCOUNTER — Telehealth: Payer: Self-pay

## 2019-07-30 ENCOUNTER — Encounter: Payer: Self-pay | Admitting: Nurse Practitioner

## 2019-07-30 ENCOUNTER — Ambulatory Visit (INDEPENDENT_AMBULATORY_CARE_PROVIDER_SITE_OTHER): Payer: Medicare Other

## 2019-07-30 ENCOUNTER — Other Ambulatory Visit: Payer: Self-pay

## 2019-07-30 ENCOUNTER — Ambulatory Visit (INDEPENDENT_AMBULATORY_CARE_PROVIDER_SITE_OTHER): Payer: Medicare Other | Admitting: Nurse Practitioner

## 2019-07-30 VITALS — BP 138/78 | HR 78 | Temp 98.4°F | Ht 61.0 in | Wt 211.6 lb

## 2019-07-30 DIAGNOSIS — R5383 Other fatigue: Secondary | ICD-10-CM | POA: Diagnosis not present

## 2019-07-30 DIAGNOSIS — Z0000 Encounter for general adult medical examination without abnormal findings: Secondary | ICD-10-CM

## 2019-07-30 DIAGNOSIS — I10 Essential (primary) hypertension: Secondary | ICD-10-CM | POA: Diagnosis not present

## 2019-07-30 DIAGNOSIS — E119 Type 2 diabetes mellitus without complications: Secondary | ICD-10-CM | POA: Diagnosis not present

## 2019-07-30 DIAGNOSIS — F329 Major depressive disorder, single episode, unspecified: Secondary | ICD-10-CM | POA: Diagnosis not present

## 2019-07-30 DIAGNOSIS — E1165 Type 2 diabetes mellitus with hyperglycemia: Secondary | ICD-10-CM

## 2019-07-30 DIAGNOSIS — Z79899 Other long term (current) drug therapy: Secondary | ICD-10-CM | POA: Diagnosis not present

## 2019-07-30 DIAGNOSIS — Z01419 Encounter for gynecological examination (general) (routine) without abnormal findings: Secondary | ICD-10-CM

## 2019-07-30 LAB — POCT URINALYSIS DIPSTICK
Bilirubin, UA: NEGATIVE
Blood, UA: NEGATIVE
Glucose, UA: NEGATIVE
Ketones, UA: NEGATIVE
Leukocytes, UA: NEGATIVE
Nitrite, UA: NEGATIVE
Protein, UA: NEGATIVE
Spec Grav, UA: >=1.03 — AB (ref 1.010–1.025)
Urobilinogen, UA: 0.2 E.U./dL
pH, UA: 5 (ref 5.0–8.0)

## 2019-07-30 LAB — POCT UA - MICROALBUMIN
Albumin/Creatinine Ratio, Urine, POC: <30
Creatinine, POC: 300 mg/dL
Microalbumin Ur, POC: 30 mg/L

## 2019-07-30 MED ORDER — MAGNESIUM 200 MG PO TABS: ORAL_TABLET | ORAL | 2 refills | Status: DC

## 2019-07-30 NOTE — Patient Instructions (Signed)
Joanna Sandoval , Thank you for taking time to come for your Medicare Wellness Visit. I appreciate your ongoing commitment to your health goals. Please review the following plan we discussed and let me know if I can assist you in the future.   Screening recommendations/referrals: Colonoscopy: 01/2018 Mammogram: 04/2018 Bone Density: 07/2012 Recommended yearly ophthalmology/optometry visit for glaucoma screening and checkup Recommended yearly dental visit for hygiene and checkup  Vaccinations: Influenza vaccine: 12/2018 Pneumococcal vaccine: hold due to covid vaccine Tdap vaccine: 09/2018 Shingles vaccine: discussed    Advanced directives: Advance directive discussed with you today. Even though you declined this today please call our office should you change your mind and we can give you the proper paperwork for you to fill out.   Conditions/risks identified: obesity  Next appointment: 10/29/2019 at 9:15   Preventive Care 65 Years and Older, Female Preventive care refers to lifestyle choices and visits with your health care provider that can promote health and wellness. What does preventive care include?  A yearly physical exam. This is also called an annual well check.  Dental exams once or twice a year.  Routine eye exams. Ask your health care provider how often you should have your eyes checked.  Personal lifestyle choices, including:  Daily care of your teeth and gums.  Regular physical activity.  Eating a healthy diet.  Avoiding tobacco and drug use.  Limiting alcohol use.  Practicing safe sex.  Taking low-dose aspirin every day.  Taking vitamin and mineral supplements as recommended by your health care provider. What happens during an annual well check? The services and screenings done by your health care provider during your annual well check will depend on your age, overall health, lifestyle risk factors, and family history of disease. Counseling  Your health care  provider may ask you questions about your:  Alcohol use.  Tobacco use.  Drug use.  Emotional well-being.  Home and relationship well-being.  Sexual activity.  Eating habits.  History of falls.  Memory and ability to understand (cognition).  Work and work Statistician.  Reproductive health. Screening  You may have the following tests or measurements:  Height, weight, and BMI.  Blood pressure.  Lipid and cholesterol levels. These may be checked every 5 years, or more frequently if you are over 70 years old.  Skin check.  Lung cancer screening. You may have this screening every year starting at age 73 if you have a 30-pack-year history of smoking and currently smoke or have quit within the past 15 years.  Fecal occult blood test (FOBT) of the stool. You may have this test every year starting at age 73.  Flexible sigmoidoscopy or colonoscopy. You may have a sigmoidoscopy every 5 years or a colonoscopy every 10 years starting at age 25.  Hepatitis C blood test.  Hepatitis B blood test.  Sexually transmitted disease (STD) testing.  Diabetes screening. This is done by checking your blood sugar (glucose) after you have not eaten for a while (fasting). You may have this done every 1-3 years.  Bone density scan. This is done to screen for osteoporosis. You may have this done starting at age 73.  Mammogram. This may be done every 1-2 years. Talk to your health care provider about how often you should have regular mammograms. Talk with your health care provider about your test results, treatment options, and if necessary, the need for more tests. Vaccines  Your health care provider may recommend certain vaccines, such as:  Influenza vaccine.  This is recommended every year.  Tetanus, diphtheria, and acellular pertussis (Tdap, Td) vaccine. You may need a Td booster every 10 years.  Zoster vaccine. You may need this after age 73.  Pneumococcal 13-valent conjugate (PCV13)  vaccine. One dose is recommended after age 73.  Pneumococcal polysaccharide (PPSV23) vaccine. One dose is recommended after age 73. Talk to your health care provider about which screenings and vaccines you need and how often you need them. This information is not intended to replace advice given to you by your health care provider. Make sure you discuss any questions you have with your health care provider. Document Released: 04/30/2015 Document Revised: 12/22/2015 Document Reviewed: 02/02/2015 Elsevier Interactive Patient Education  2017 [place] Prevention in the Home Falls can cause injuries. They can happen to people of all ages. There are many things you can do to make your home safe and to help prevent falls. What can I do on the outside of my home?  Regularly fix the edges of walkways and driveways and fix any cracks.  Remove anything that might make you trip as you walk through a door, such as a raised step or threshold.  Trim any bushes or trees on the path to your home.  Use bright outdoor lighting.  Clear any walking paths of anything that might make someone trip, such as rocks or tools.  Regularly check to see if handrails are loose or broken. Make sure that both sides of any steps have handrails.  Any raised decks and porches should have guardrails on the edges.  Have any leaves, snow, or ice cleared regularly.  Use sand or salt on walking paths during winter.  Clean up any spills in your garage right away. This includes oil or grease spills. What can I do in the bathroom?  Use night lights.  Install grab bars by the toilet and in the tub and shower. Do not use towel bars as grab bars.  Use non-skid mats or decals in the tub or shower.  If you need to sit down in the shower, use a plastic, non-slip stool.  Keep the floor dry. Clean up any water that spills on the floor as soon as it happens.  Remove soap buildup in the tub or shower  regularly.  Attach bath mats securely with double-sided non-slip rug tape.  Do not have throw rugs and other things on the floor that can make you trip. What can I do in the bedroom?  Use night lights.  Make sure that you have a light by your bed that is easy to reach.  Do not use any sheets or blankets that are too big for your bed. They should not hang down onto the floor.  Have a firm chair that has side arms. You can use this for support while you get dressed.  Do not have throw rugs and other things on the floor that can make you trip. What can I do in the kitchen?  Clean up any spills right away.  Avoid walking on wet floors.  Keep items that you use a lot in easy-to-reach places.  If you need to reach something above you, use a strong step stool that has a grab bar.  Keep electrical cords out of the way.  Do not use floor polish or wax that makes floors slippery. If you must use wax, use non-skid floor wax.  Do not have throw rugs and other things on the floor that can make  you trip. What can I do with my stairs?  Do not leave any items on the stairs.  Make sure that there are handrails on both sides of the stairs and use them. Fix handrails that are broken or loose. Make sure that handrails are as long as the stairways.  Check any carpeting to make sure that it is firmly attached to the stairs. Fix any carpet that is loose or worn.  Avoid having throw rugs at the top or bottom of the stairs. If you do have throw rugs, attach them to the floor with carpet tape.  Make sure that you have a light switch at the top of the stairs and the bottom of the stairs. If you do not have them, ask someone to add them for you. What else can I do to help prevent falls?  Wear shoes that:  Do not have high heels.  Have rubber bottoms.  Are comfortable and fit you well.  Are closed at the toe. Do not wear sandals.  If you use a stepladder:  Make sure that it is fully  opened. Do not climb a closed stepladder.  Make sure that both sides of the stepladder are locked into place.  Ask someone to hold it for you, if possible.  Clearly mark and make sure that you can see:  Any grab bars or handrails.  First and last steps.  Where the edge of each step is.  Use tools that help you move around (mobility aids) if they are needed. These include:  Canes.  Walkers.  Scooters.  Crutches.  Turn on the lights when you go into a dark area. Replace any light bulbs as soon as they burn out.  Set up your furniture so you have a clear path. Avoid moving your furniture around.  If any of your floors are uneven, fix them.  If there are any pets around you, be aware of where they are.  Review your medicines with your doctor. Some medicines can make you feel dizzy. This can increase your chance of falling. Ask your doctor what other things that you can do to help prevent falls. This information is not intended to replace advice given to you by your health care provider. Make sure you discuss any questions you have with your health care provider. Document Released: 01/28/2009 Document Revised: 09/09/2015 Document Reviewed: 05/08/2014 Elsevier Interactive Patient Education  2017 [employer].

## 2019-07-30 NOTE — Patient Instructions (Signed)
Health Maintenance After Age 73 After age 73, you are at a higher risk for certain long-term diseases and infections as well as injuries from falls. Falls are a major cause of broken bones and head injuries in people who are older than age 73. Getting regular preventive care can help to keep you healthy and well. Preventive care includes getting regular testing and making lifestyle changes as recommended by your health care provider. Talk with your health care provider about:  Which screenings and tests you should have. A screening is a test that checks for a disease when you have no symptoms.  A diet and exercise plan that is right for you. What should I know about screenings and tests to prevent falls? Screening and testing are the best ways to find a health problem early. Early diagnosis and treatment give you the best chance of managing medical conditions that are common after age 73. Certain conditions and lifestyle choices may make you more likely to have a fall. Your health care provider may recommend:  Regular vision checks. Poor vision and conditions such as cataracts can make you more likely to have a fall. If you wear glasses, make sure to get your prescription updated if your vision changes.  Medicine review. Work with your health care provider to regularly review all of the medicines you are taking, including over-the-counter medicines. Ask your health care provider about any side effects that may make you more likely to have a fall. Tell your health care provider if any medicines that you take make you feel dizzy or sleepy.  Osteoporosis screening. Osteoporosis is a condition that causes the bones to get weaker. This can make the bones weak and cause them to break more easily.  Blood pressure screening. Blood pressure changes and medicines to control blood pressure can make you feel dizzy.  Strength and balance checks. Your health care provider may recommend certain tests to check your  strength and balance while standing, walking, or changing positions.  Foot health exam. Foot pain and numbness, as well as not wearing proper footwear, can make you more likely to have a fall.  Depression screening. You may be more likely to have a fall if you have a fear of falling, feel emotionally low, or feel unable to do activities that you used to do.  Alcohol use screening. Using too much alcohol can affect your balance and may make you more likely to have a fall. What actions can I take to lower my risk of falls? General instructions  Talk with your health care provider about your risks for falling. Tell your health care provider if: ? You fall. Be sure to tell your health care provider about all falls, even ones that seem minor. ? You feel dizzy, sleepy, or off-balance.  Take over-the-counter and prescription medicines only as told by your health care provider. These include any supplements.  Eat a healthy diet and maintain a healthy weight. A healthy diet includes low-fat dairy products, low-fat (lean) meats, and fiber from whole grains, beans, and lots of fruits and vegetables. Home safety  Remove any tripping hazards, such as rugs, cords, and clutter.  Install safety equipment such as grab bars in bathrooms and safety rails on stairs.  Keep rooms and walkways well-lit. Activity   Follow a regular exercise program to stay fit. This will help you maintain your balance. Ask your health care provider what types of exercise are appropriate for you.  If you need a cane or   walker, use it as recommended by your health care provider.  Wear supportive shoes that have nonskid soles. Lifestyle  Do not drink alcohol if your health care provider tells you not to drink.  If you drink alcohol, limit how much you have: ? 0-1 drink a day for women. ? 0-2 drinks a day for men.  Be aware of how much alcohol is in your drink. In the [place] one drink equals one typical bottle of beer (12  oz), one-half glass of wine (5 oz), or one shot of hard liquor (1 oz).  Do not use any products that contain nicotine or tobacco, such as cigarettes and e-cigarettes. If you need help quitting, ask your health care provider. Summary  Having a healthy lifestyle and getting preventive care can help to protect your health and wellness after age 73.  Screening and testing are the best way to find a health problem early and help you avoid having a fall. Early diagnosis and treatment give you the best chance for managing medical conditions that are more common for people who are older than age 73.  Falls are a major cause of broken bones and head injuries in people who are older than age 73. Take precautions to prevent a fall at home.  Work with your health care provider to learn what changes you can make to improve your health and wellness and to prevent falls. This information is not intended to replace advice given to you by your health care provider. Make sure you discuss any questions you have with your health care provider. Document Revised: 07/25/2018 Document Reviewed: 02/14/2017 Elsevier Patient Education  2020 Else[employer] Inc[employer]  

## 2019-07-30 NOTE — Addendum Note (Signed)
Addended by: Glenna Durand E on: 07/30/2019 11:00 AM   Modules accepted: Orders

## 2019-07-30 NOTE — Progress Notes (Addendum)
This visit occurred during the SARS-CoV-2 public health emergency.  Safety protocols were in place, including screening questions prior to the visit, additional usage of staff PPE, and extensive cleaning of exam room while observing appropriate contact time as indicated for disinfecting solutions.  Subjective:     Patient ID: Joanna Sandoval , female    DOB: October 20, 1945 , 74 y.o.   MRN: [id number]   Chief Complaint  Patient presents with  . Annual Exam    HPI  Here for HM  She has had her Pfizer vaccine, will wait until June for her Mammogram.   She had been seeing Dr Mora Bellman for her GYN.    Wt Readings from Last 3 Encounters: 07/30/19 : 211 lb 9.6 oz (96 kg) 07/30/19 : 211 lb 9.6 oz (96 kg) 05/08/19 : 213 lb 6.4 oz (96.8 kg)   Hypertension This is a chronic problem. The current episode started more than 1 year ago. The problem is unchanged. The problem is controlled. Pertinent negatives include no chest pain or palpitations. There are no associated agents to hypertension. Risk factors for coronary artery disease include sedentary lifestyle, obesity, dyslipidemia and diabetes mellitus. The current treatment provides no improvement. There are no compliance problems.  There is no history of angina.    The patient states she is post menopausal status for birth control. . Negative for Dysmenorrhea and Negative for Menorrhagia Mammogram last done 05/15/2018. Negative for: breast discharge, breast lump(s), breast pain and breast self exam.  Pertinent negatives include abnormal bleeding (hematology), anxiety, decreased libido, difficulty falling sleep, dyspareunia, history of infertility, nocturia, sexual dysfunction, sleep disturbances, urinary incontinence, urinary urgency, vaginal discharge and vaginal itching. She is feeling depression since the Pandemic.  Diet regular. The patient states her exercise level is minimal.     The patient's tobacco use is:  Social History   Tobacco Use   Smoking Status Former Smoker  . Types: Cigarettes  Smokeless Tobacco Never Used   She has been exposed to passive smoke. The patient's alcohol use is:  Social History   Substance and Sexual Activity  Alcohol Use No    Past Medical History:  Diagnosis Date  . Arthritis   . Diabetes mellitus   . Hypercholesteremia   . Hypertension   . Post-menopausal bleeding      Family History  Problem Relation Age of Onset  . Hypertension Mother   . Hypertension Father   . Cancer Father        ?lung  . Diabetes Sister      Current Outpatient Medications:  .  albuterol (PROVENTIL HFA;VENTOLIN HFA) 108 (90 Base) MCG/ACT inhaler, Inhale 2 puffs into the lungs every 6 (six) hours as needed., Disp: , Rfl:  .  atenolol (TENORMIN) 100 MG tablet, Take 50 mg by mouth daily. , Disp: , Rfl:  .  atorvastatin (LIPITOR) 40 MG tablet, Take 1 tablet (40 mg total) by mouth daily. Take 1/2 pill at bedtime, Disp: 90 tablet, Rfl: 1 .  Blood Glucose Monitoring Suppl (ACCU-CHEK GUIDE) w/Device KIT, 1 Stick by Does not apply route 2 (two) times daily at 10 AM and 5 PM., Disp: 1 kit, Rfl: 1 .  carboxymethylcellulose (REFRESH PLUS) 0.5 % SOLN, 1 drop 3 (three) times daily as needed., Disp: , Rfl:  .  D3-50 50000 units capsule, , Disp: , Rfl: 0 .  diclofenac sodium (VOLTAREN) 1 % GEL, Apply 2 g topically 4 (four) times daily., Disp: 100 g, Rfl: 1 .  glimepiride (AMARYL)  2 MG tablet, TAKE 1 AND 1/2 TABLETS BY MOUTH DAILY, Disp: 135 tablet, Rfl: 1 .  glucose blood (ACCU-CHEK GUIDE) test strip, Use as instructed, Disp: 100 each, Rfl: 12 .  [name] 100 MG tablet, TAKE 1 TABLET(100 MG) BY MOUTH DAILY, Disp: 90 tablet, Rfl: 1 .  [name] lancets, Use as instructed, Disp: 100 each, Rfl: 12 .  [name] KIT, Use to test blood sugar twice daily as directed. E11.65, Disp: 1 kit, Rfl: 12 .  latanoprost (XALATAN) 0.005 % ophthalmic solution, 1 drop at bedtime., Disp: ,  Rfl:  .  losartan (COZAAR) 50 MG tablet, Take 50 mg by mouth daily., Disp: , Rfl:  .  predniSONE (DELTASONE) 10 MG tablet, Take 3 tablets (30 mg total) by mouth daily with breakfast. (Patient not taking: Reported on 07/07/2019), Disp: 9 tablet, Rfl: 0   No Known Allergies   Review of Systems  Constitutional: Positive for fatigue.  HENT: Negative.   Eyes: Negative.   Respiratory: Negative.   Cardiovascular: Negative.  Negative for chest pain, palpitations and leg swelling.  Gastrointestinal: Negative.   Endocrine: Negative.  Negative for polydipsia, polyphagia and polyuria.  Genitourinary: Negative.   Musculoskeletal: Negative.   Skin: Negative.   Allergic/Immunologic: Negative.   Neurological: Negative.   Hematological: Negative.   Psychiatric/Behavioral:       She is feeling down     Today's Vitals   07/30/19 0928  BP: 138/78  Pulse: 78  Temp: 98.4 F (36.9 C)  TempSrc: Oral  SpO2: 93%  Weight: 211 lb 9.6 oz (96 kg)  Height: '5\' 1"'$  (1.549 m)   Body mass index is 39.98 kg/m.   Objective:  Physical Exam Constitutional:      Appearance: Normal appearance. She is well-developed.  HENT:     Head: Normocephalic and atraumatic.     Right Ear: Hearing, tympanic membrane, ear canal and external ear normal. There is no impacted cerumen.     Left Ear: Hearing, tympanic membrane, ear canal and external ear normal. There is no impacted cerumen.     Nose:     Comments: Deferred - mask    Mouth/Throat:     Comments: Deferred - mask Eyes:     General: Lids are normal.     Extraocular Movements: Extraocular movements intact.     Conjunctiva/sclera: Conjunctivae normal.     Pupils: Pupils are equal, round, and reactive to light.     Funduscopic exam:    Right eye: No papilledema.        Left eye: No papilledema.  Neck:     Thyroid: No thyroid mass.     Vascular: No carotid bruit.  Cardiovascular:     Rate and Rhythm: Normal rate and regular rhythm.     Pulses: Normal  pulses.     Heart sounds: Normal heart sounds. No murmur.  Pulmonary:     Effort: Pulmonary effort is normal.     Breath sounds: Normal breath sounds.  Abdominal:     General: Abdomen is flat. Bowel sounds are normal. There is no distension.     Palpations: Abdomen is soft.  Musculoskeletal:        General: No swelling. Normal range of motion.     Cervical back: Full passive range of motion without pain, normal range of motion and neck supple.     Right lower leg: No edema.     Left lower leg: No edema.  Skin:  General: Skin is warm and dry.     Capillary Refill: Capillary refill takes less than 2 seconds.  Neurological:     General: No focal deficit present.     Mental Status: She is alert and oriented to person, place, and time.     Cranial Nerves: No cranial nerve deficit.     Sensory: No sensory deficit.  Psychiatric:        Mood and Affect: Mood normal.        Behavior: Behavior normal.        Thought Content: Thought content normal.        Judgment: Judgment normal.         Assessment And Plan:     1. Health maintenance examination Behavior modifications discussed and diet history reviewed.   Pt will continue to exercise regularly and modify diet with low GI, plant based foods and decrease intake of processed foods.  Recommend intake of daily multivitamin, Vitamin D, and calcium.  Recommend for preventive screenings, as well as recommend immunizations that include influenza  2.  Uncontrolled Type 2 diabetes mellitus with hyperglycemia Chronic, controlled Continue with current medications Encouraged to limit intake of sugary foods and drinks Encouraged to increase physical activity to 150 minutes per week - EKG 12-Lead  3. Essential hypertension  Chronic, fair control  Continue with current medications  EKG done with NSR with HR 72  4. Reactive depression  She feels this is related to not being able to go out as much with the pandemic  She does not want  to start medications at this time.  Depression screen score is 10  5. Fatigue, unspecified type  May be related to deconditioning  Encouraged to make sure she is staying well hydrated.         [name], FNP    THE PATIENT IS ENCOURAGED TO PRACTICE SOCIAL DISTANCING DUE TO THE COVID-19 PANDEMIC.

## 2019-07-30 NOTE — Progress Notes (Signed)
This visit occurred during the SARS-CoV-2 public health emergency.  Safety protocols were in place, including screening questions prior to the visit, additional usage of staff PPE, and extensive cleaning of exam room while observing appropriate contact time as indicated for disinfecting solutions.  Subjective:   Anaalicia Reimann is a 74 y.o. female who presents for Medicare Annual (Subsequent) preventive examination.  Review of Systems:  n/a Cardiac Risk Factors include: advanced age (>54mn, >>47women);diabetes mellitus;hypertension;obesity (BMI >30kg/m2)     Objective:     Vitals: BP 138/78 (BP Location: Left Arm, Patient Position: Sitting, Cuff Size: Large)   Pulse 78   Temp 98.4 F (36.9 C) (Oral)   Ht 5' 1" (1.549 m)   Wt 211 lb 9.6 oz (96 kg)   SpO2 93%   BMI 39.98 kg/m   Body mass index is 39.98 kg/m.  Advanced Directives 07/30/2019 06/19/2018 05/02/2017  Does Patient Have a Medical Advance Directive? No No No  Would patient like information on creating a medical advance directive? No - Patient declined Yes (MAU/Ambulatory/Procedural Areas - Information given) Yes (MAU/Ambulatory/Procedural Areas - Information given)    Tobacco Social History   Tobacco Use  Smoking Status Former Smoker  . Types: Cigarettes  Smokeless Tobacco Never Used     Counseling given: Not Answered   Clinical Intake:  Pre-visit preparation completed: Yes  Pain : No/denies pain     Nutritional Status: BMI > 30  Obese Nutritional Risks: None Diabetes: Yes  How often do you need to have someone help you when you read instructions, pamphlets, or other written materials from your doctor or pharmacy?: 1 - Never What is the last grade level you completed in school?: 11th grade  Interpreter Needed?: No  Information entered by :: NAllen LPN  Past Medical History:  Diagnosis Date  . Arthritis   . Diabetes mellitus   . Hypercholesteremia   . Hypertension   . Post-menopausal bleeding     Past Surgical History:  Procedure Laterality Date  . CATARACT EXTRACTION    . DILATION AND CURETTAGE OF UTERUS    . HYSTEROSCOPY WITH D & C N/A 05/02/2017   Procedure: DILATATION AND CURETTAGE /HYSTEROSCOPY;  Surgeon: CMora Bellman MD;  [facility]  Service: Gynecology;  Laterality: N/A;  . KNEE ARTHROSCOPY     Family History  Problem Relation Age of Onset  . Hypertension Mother   . Hypertension Father   . Cancer Father        ?lung  . Diabetes Sister    Social History   Socioeconomic History  . Marital status: Divorced    Spouse name: Not on file  . Number of children: Not on file  . Years of education: Not on file  . Highest education level: Not on file  Occupational History  . Not on file  Tobacco Use  . Smoking status: Former Smoker    Types: Cigarettes  . Smokeless tobacco: Never Used  Substance and Sexual Activity  . Alcohol use: No  . Drug use: No  . Sexual activity: Not Currently    Birth control/protection: Post-menopausal  Other Topics Concern  . Not on file  Social History Narrative  . Not on file   Social Determinants of Health   Financial Resource Strain: Low Risk   . Difficulty of Paying Living Expenses: Not hard at all  Food Insecurity: No Food Insecurity  . Worried About RCharity fundraiserin the Last Year: Never true  . Ran  Out of Food in the Last Year: Never true  Transportation Needs: No Transportation Needs  . Lack of Transportation (Medical): No  . Lack of Transportation (Non-Medical): No  Physical Activity: Sufficiently Active  . Days of Exercise per Week: 7 days  . Minutes of Exercise per Session: 60 min  Stress: No Stress Concern Present  . Feeling of Stress : Not at all  Social Connections: Slightly Isolated  . Frequency of Communication with Friends and Family: More than three times a week  . Frequency of Social Gatherings with Friends and Family: Twice a week  . Attends Religious Services: More than 4  times per year  . Active Member of Clubs or Organizations: Yes  . Attends Archivist Meetings: More than 4 times per year  . Marital Status: Divorced    Outpatient Encounter Medications as of 07/30/2019  Medication Sig  . albuterol (PROVENTIL HFA;VENTOLIN HFA) 108 (90 Base) MCG/ACT inhaler Inhale 2 puffs into the lungs every 6 (six) hours as needed.  [name] atenolol (TENORMIN) 100 MG tablet Take 50 mg by mouth daily.   [name] atorvastatin (LIPITOR) 40 MG tablet Take 1 tablet (40 mg total) by mouth daily. Take 1/2 pill at bedtime  . Blood Glucose Monitoring Suppl (ACCU-CHEK GUIDE) w/Device KIT 1 Stick by Does not apply route 2 (two) times daily at 10 AM and 5 PM.  . carboxymethylcellulose (REFRESH PLUS) 0.5 % SOLN 1 drop 3 (three) times daily as needed.  . D3-50 50000 units capsule   . diclofenac sodium (VOLTAREN) 1 % GEL Apply 2 g topically 4 (four) times daily.  [name] glimepiride (AMARYL) 2 MG tablet TAKE 1 AND 1/2 TABLETS BY MOUTH DAILY  . glucose blood (ACCU-CHEK GUIDE) test strip Use as instructed  . [name] 100 MG tablet TAKE 1 TABLET(100 MG) BY MOUTH DAILY  . [name] lancets Use as instructed  . [name] KIT Use to test blood sugar twice daily as directed. E11.65  . latanoprost (XALATAN) 0.005 % ophthalmic solution 1 drop at bedtime.  [name] losartan (COZAAR) 50 MG tablet Take 50 mg by mouth daily.  . [DISCONTINUED] predniSONE (DELTASONE) 10 MG tablet Take 3 tablets (30 mg total) by mouth daily with breakfast. (Patient not taking: Reported on 07/07/2019)   No facility-administered encounter medications on file as of 07/30/2019.    Activities of Daily Living In your present state of health, do you have any difficulty performing the following activities: 07/30/2019  Hearing? Y  Comment decreased in the left ear  Vision? Y  Comment decreased focusing in left eye  Difficulty concentrating or making decisions? Y  Comment forgetfulness    Walking or climbing stairs? Y  Dressing or bathing? N  Doing errands, shopping? N  Preparing Food and eating ? N  Using the Toilet? N  In the past six months, have you accidently leaked urine? Y  Comment wears pads  Do you have problems with loss of bowel control? N  Managing your Medications? N  Managing your Finances? N  Housekeeping or managing your Housekeeping? N  Some recent data might be hidden    Patient Care Team: [name], FNP as PCP - General ([place]) Rex Kras, Claudette Stapler, RN as Case Manager [name] as Social Worker    Assessment:   This is a routine wellness examination for [place].  Exercise Activities and Dietary recommendations Current Exercise Habits: Home exercise routine, Type of exercise: walking, Time (Minutes): 60, Frequency (Times/Week): 7,  Weekly Exercise (Minutes/Week): 420  Goals    . "I would like for my Diabetes to be better managed" (pt-stated)     Current Barriers:  [name] Knowledge Deficits related to Diabetes disease process and Self Health Management  Nurse Case Manager & PharmD Clinical Goal(s):  [name] Over the next 90 days, patient will verbalize basic understanding of Diabetes disease process and self health management plan as evidenced by patient will verbalize understanding of importance to adhering to her diabetic meal plan, report taking her diabetic medications exactly as prescribed and will verbalize knowing when to call the CCM team and or provider if needed for hypo/hyperglycemic events. Goal Met . New - 02/17/19 Over the next 90 days, patient will work with the [facility] team for ongoing education on [place] management interventions to help lower her A1C <7.4 - Goal Not Met . New - 07/08/19 Over the next 90 days, patient will be able to verbalize how to Self management symptoms of Hyper and Hypoglycemia . New - 07/08/19 Over the next 90 days, patient will experience having no complications including no ED visits, and or IP events  secondary to DM  CCM RN CM Interventions:  07/07/19 Inbound call received from patient  . Inbound call received from patient stating she received a cortisone injection to help with pain management and it raised her BS to 400 . Evaluation of current treatment plan related to Diabetes and patient's adherence to plan as established by provider . Reviewed medications with patient and discussed patient is adhering to taking all medications exactly as prescribed . Instructed patient to drink plenty of water and recheck her BS when possible; patient verbalizes understanding; Determined patient is at work and does not have her glucometer at this time but will increase her water intake and recheck her BS once she returns home; patient agrees to call the CCM RN with an update on her BS once able to recheck it   . Education provided on the effects of steroid treatment on DM and reiterated importance of adhering to low carb diet and monitor BS more closely for up to 2 weeks after receiving this therapy . Discussed plans with patient for ongoing care management follow up and provided patient with direct contact information for care management team . Advised patient, providing education and rationale, to check cbg daily before meals and record, calling the CCM team and or PCP for findings outside established parameters. FBS 80-130; after mealtime BS <190 07/07/19 Inbound call from patient  . Received voice message from patient stating her BS is down to 134 and she is feeling well 07/08/19 Placed outbound follow up call to patient . Determined patient's FBS this am was 199; patient states she is asymptomatic of hyperglycemia and will recheck her BS after finishing work . Reiterated importance of taking diabetic medications exactly as prescribed, adhering to low carb, diabetic friendly diet and drinking plenty of water . Encouraged patient to eat an earlier diet avoiding complex carbohydrates and or if she prefers a  bedtime snack to eat a low carb heart healthy snack (reviewed food groups with patient)  . Reiterated importance of closely monitoring CBG's at home and notifying the CCM team and or PCP promptly of BS continues to exceed given parameters and or if her Self health interventions do on lower BS  . Discussed plans with patient for ongoing care management follow up and provided patient with direct contact information for care management team . Printed DM educational materials to  be mailed to patient; Diabetes Management Using Meal Planning; Diabetes Safety Zone Tool; Carb Counting; Carb Choices; Hypoglycemia  CCM PharmD Interventions:  Completed CCM PharmD Telephone Follow Up call with patient on 04/30/19  Evaluation of current treatment plan related to diabetes management and patient's adherence to plan as established by provider.  Reports compliance with medications.  Comprehensive medication review performed.  NO changes to medication regimen at this time.  Patient denies hypoglycemia, reports BGs are better controlled.  She has been able to take medication as prescribed due to patient assistance.  She has also been doing better with diet/exercise.  She continues to work part time.  Patient states her FBG ranges from 120-130.  Patient's A1c has been trending down (most recently 7.4% on 01/29/19).   Anticipate lower A1c based on BG readings.  04/14/19-->Patient states she is using a Solus V2 glucometer.  Test strips called in per patient request for twice daily testing.  Solus V2 Meter/strips not available--pt now admits meter is old.  Will try new meter covered by insurance.  Encouraged patient to stick with drinking water and avoid sugary drinks.  Patient verbalizes understanding.  Will continue to discuss adjusting medications to achieve optimal control.  Patient is still working part-time and continues to wear her mask in public areas; only shops in the early morning to stay safe  Educated patient  on recommendations for carb amount for snacks and meals    **Consider SGLT2 for optimal DM control and benefits. Will address and next visit   Patient Self Care Activities:   Verbalizes understanding of the education and information provided today  Self administers medications as prescribed  Attends all scheduled provider appointments  Calls pharmacy for medication refills  Patient Self Care Activities:  . Patient verbalizes understanding to call the CCM team and or PCP to report ongoing hyperglycemic events following Cortisone injection  . Self administers medications as prescribed . Attends all scheduled provider appointments . Calls pharmacy for medication refills . Performs ADL's independently . Performs IADL's independently . Calls provider office for new concerns or questions  Please see past updates related to this goal by clicking on the "Past Updates" button in the selected goal       . Exercise 150 min/wk Moderate Activity (pt-stated)     Walking regularly.  Walking further than usual to to move more. Eat more healthy and cut down on medications    . Patient Stated     07/30/2019, wants to be more active and do better with medication (remembering to take at bedtime)       Fall Risk Fall Risk  07/30/2019 05/08/2019 01/30/2019 07/31/2018 06/19/2018  Falls in the past year? 0 0 1 0 0  Number falls in past yr: - - 0 - -  Injury with Fall? - - 0 - -  Risk for fall due to : Medication side effect;Impaired balance/gait - - - Medication side effect  Follow up Falls evaluation completed;Education provided;Falls prevention discussed - - - Falls prevention discussed;Education provided   Is the patient's home free of loose throw rugs in walkways, pet beds, electrical cords, etc?   yes      Grab bars in the bathroom? yes      Handrails on the stairs?   n/a      Adequate lighting?   yes  Timed Get Up and Go performed: n/a  Depression Screen PHQ 2/9 Scores 07/30/2019 05/08/2019  01/30/2019 08/08/2018  PHQ - 2 Score 6 0  0 1  PHQ- 9 Score 10 - - 3     Cognitive Function     6CIT Screen 07/30/2019 06/19/2018  What Year? 0 points 0 points  What month? 0 points 0 points  What time? 3 points 3 points  Count back from 20 0 points 0 points  Months in reverse 4 points 2 points  Repeat phrase 4 points 0 points  Total Score 11 5    Immunization History  Administered Date(s) Administered  . Influenza, High Dose Seasonal PF 01/16/2018  . Influenza,inj,Quad PF,6+ Mos 01/12/2019  . PFIZER SARS-COV-2 Vaccination 05/25/2019, 06/18/2019  . Tdap 10/06/2018    Qualifies for Shingles Vaccine? yes  Screening Tests Health Maintenance  Topic Date Due  . PNA vac Low Risk Adult (2 of 2 - PPSV23) 07/29/2020 (Originally 12/27/2017)  . HEMOGLOBIN A1C  11/05/2019  . INFLUENZA VACCINE  11/16/2019  . OPHTHALMOLOGY EXAM  12/11/2019  . MAMMOGRAM  05/15/2020  . FOOT EXAM  07/29/2020  . COLONOSCOPY  02/05/2028  . TETANUS/TDAP  10/05/2028  . DEXA SCAN  Completed  . Hepatitis C Screening  Completed    Cancer Screenings: Lung: Low Dose CT Chest recommended if Age 59-80 years, 30 pack-year currently smoking OR have quit w/in 15years. Patient does not qualify. Breast:  Up to date on Mammogram? Yes   Up to date of Bone Density/Dexa? Yes Colorectal: up to date  Additional Screenings: : Hepatitis C Screening: 10/30/2018     Plan:    Patient wants to be more active and do better with remembering to take evening medicine.   I have personally reviewed and noted the following in the patient's chart:   . Medical and social history . Use of alcohol, tobacco or illicit drugs  . Current medications and supplements . Functional ability and status . Nutritional status . Physical activity . Advanced directives . List of other physicians . Hospitalizations, surgeries, and ER visits in previous 12 months . Vitals . Screenings to include cognitive, depression, and falls . Referrals and  appointments  In addition, I have reviewed and discussed with patient certain preventive protocols, quality metrics, and best practice recommendations. A written personalized care plan for preventive services as well as general preventive health recommendations were provided to patient.     [name], LPN  [phone]

## 2019-07-31 LAB — CMP14+EGFR
ALT: 16 IU/L (ref 0–32)
AST: 13 IU/L (ref 0–40)
Albumin/Globulin Ratio: 1.3 (ref 1.2–2.2)
Albumin: 4.7 g/dL (ref 3.7–4.7)
Alkaline Phosphatase: 83 IU/L (ref 39–117)
BUN/Creatinine Ratio: 10 — ABNORMAL LOW (ref 12–28)
BUN: 12 mg/dL (ref 8–27)
Bilirubin Total: 0.2 mg/dL (ref 0.0–1.2)
CO2: 22 mmol/L (ref 20–29)
Calcium: 10.4 mg/dL — ABNORMAL HIGH (ref 8.7–10.3)
Chloride: 99 mmol/L (ref 96–106)
Creatinine, Ser: 1.15 mg/dL — ABNORMAL HIGH (ref 0.57–1.00)
GFR calc Af Amer: 55 mL/min/{1.73_m2} — ABNORMAL LOW (ref >59)
GFR calc non Af Amer: 48 mL/min/{1.73_m2} — ABNORMAL LOW (ref >59)
Globulin, Total: 3.7 g/dL (ref 1.5–4.5)
Glucose: 389 mg/dL — ABNORMAL HIGH (ref 65–99)
Potassium: 4.6 mmol/L (ref 3.5–5.2)
Sodium: 136 mmol/L (ref 134–144)
Total Protein: 8.4 g/dL (ref 6.0–8.5)

## 2019-07-31 LAB — CBC
Hematocrit: 41.5 % (ref 34.0–46.6)
Hemoglobin: 13.3 g/dL (ref 11.1–15.9)
MCH: 28.4 pg (ref 26.6–33.0)
MCHC: 32 g/dL (ref 31.5–35.7)
MCV: 89 fL (ref 79–97)
Platelets: 386 10*3/uL (ref 150–450)
RBC: 4.68 x10E6/uL (ref 3.77–5.28)
RDW: 11.7 % (ref 11.7–15.4)
WBC: 6.9 10*3/uL (ref 3.4–10.8)

## 2019-07-31 LAB — LIPID PANEL
Chol/HDL Ratio: 4.9 ratio — ABNORMAL HIGH (ref 0.0–4.4)
Cholesterol, Total: 220 mg/dL — ABNORMAL HIGH (ref 100–199)
HDL: 45 mg/dL (ref >39)
LDL Chol Calc (NIH): 149 mg/dL — ABNORMAL HIGH (ref 0–99)
Triglycerides: 146 mg/dL (ref 0–149)
VLDL Cholesterol Cal: 26 mg/dL (ref 5–40)

## 2019-07-31 LAB — HEMOGLOBIN A1C
Est. average glucose Bld gHb Est-mCnc: 266 mg/dL
Hgb A1c MFr Bld: 10.9 % — ABNORMAL HIGH (ref 4.8–5.6)

## 2019-07-31 LAB — TSH: TSH: 0.49 u[IU]/mL (ref 0.450–4.500)

## 2019-08-03 ENCOUNTER — Other Ambulatory Visit: Payer: Self-pay | Admitting: Nurse Practitioner

## 2019-08-03 DIAGNOSIS — E119 Type 2 diabetes mellitus without complications: Secondary | ICD-10-CM

## 2019-08-04 ENCOUNTER — Other Ambulatory Visit: Payer: Self-pay | Admitting: Nurse Practitioner

## 2019-08-04 ENCOUNTER — Encounter: Payer: Self-pay | Admitting: *Deleted

## 2019-08-04 ENCOUNTER — Telehealth: Payer: Self-pay

## 2019-08-04 DIAGNOSIS — E1165 Type 2 diabetes mellitus with hyperglycemia: Secondary | ICD-10-CM

## 2019-08-04 MED ORDER — RYBELSUS 3 MG PO TABS: 1.0000 | ORAL_TABLET | Freq: Every day | ORAL | 2 refills | Status: DC

## 2019-08-20 ENCOUNTER — Telehealth: Payer: Self-pay

## 2019-08-20 ENCOUNTER — Other Ambulatory Visit: Payer: Self-pay

## 2019-08-20 ENCOUNTER — Ambulatory Visit: Payer: Self-pay

## 2019-08-20 DIAGNOSIS — I10 Essential (primary) hypertension: Secondary | ICD-10-CM

## 2019-08-20 DIAGNOSIS — E1165 Type 2 diabetes mellitus with hyperglycemia: Secondary | ICD-10-CM

## 2019-08-21 NOTE — Chronic Care Management (AMB) (Signed)
  Chronic Care Management   Outreach Note  08/21/2019 Name: Joanna Sandoval MRN: [id number] DOB: [birth year]  Referred by: [name], FNP Reason for referral : Chronic Care Management (FU Call DM f/u on patient ed mats/hyperglycemia)   An unsuccessful telephone outreach was attempted today. The patient was referred to the case management team for assistance with care management and care coordination.   Follow Up Plan: Telephone follow up appointment with care management team member scheduled for: 09/19/19  [name], RN, BSN, CCM Care Management Coordinator [facility]  Direct Phone: [phone]

## 2019-08-29 ENCOUNTER — Ambulatory Visit: Payer: Self-pay

## 2019-08-29 DIAGNOSIS — I10 Essential (primary) hypertension: Secondary | ICD-10-CM

## 2019-08-29 DIAGNOSIS — E1165 Type 2 diabetes mellitus with hyperglycemia: Secondary | ICD-10-CM

## 2019-08-29 NOTE — Chronic Care Management (AMB) (Signed)
  Chronic Care Management   Outreach Note  08/29/2019 Name: Joanna Sandoval MRN: [id number] DOB: [birth year]  Referred by: [name], FNP Reason for referral : Care Coordination   SW placed an unsuccessful outbound call to the patient in an effort to assist with care coordination needs. SW unable to leave a voice message due to the patients voice mailbox being full.   Follow Up Plan: The care management team will reach out to the patient again over the next 10 days.   [name], BSW, CDP Social Worker, Certified Dementia Practitioner [place] [facility] [phone]

## 2019-09-05 ENCOUNTER — Telehealth: Payer: Self-pay

## 2019-09-05 ENCOUNTER — Ambulatory Visit: Payer: Self-pay

## 2019-09-05 DIAGNOSIS — E1165 Type 2 diabetes mellitus with hyperglycemia: Secondary | ICD-10-CM

## 2019-09-05 DIAGNOSIS — I10 Essential (primary) hypertension: Secondary | ICD-10-CM

## 2019-09-05 NOTE — Chronic Care Management (AMB) (Signed)
  Chronic Care Management   Outreach Note  09/05/2019 Name: Myleigh Whitfill MRN: [id number] DOB: [birth year]  Referred by: [name], FNP Reason for referral : Care Coordination   SW placed a second unsuccessful outbound call to the patient to assist with care management and care coordination needs. SW left a HIPAA compliant voice message requesting a return call.  Follow Up Plan: Telephonic outreach scheduled for June 4th with RN Care Manager.  [name], BSW, CDP Social Worker, Certified Dementia Practitioner [place] [facility] [phone]

## 2019-09-08 DIAGNOSIS — E119 Type 2 diabetes mellitus without complications: Secondary | ICD-10-CM | POA: Diagnosis not present

## 2019-09-08 DIAGNOSIS — E785 Hyperlipidemia, unspecified: Secondary | ICD-10-CM | POA: Diagnosis not present

## 2019-09-08 DIAGNOSIS — I10 Essential (primary) hypertension: Secondary | ICD-10-CM | POA: Diagnosis not present

## 2019-09-08 DIAGNOSIS — M1990 Unspecified osteoarthritis, unspecified site: Secondary | ICD-10-CM | POA: Diagnosis not present

## 2019-09-11 ENCOUNTER — Encounter: Payer: Medicare Other | Admitting: Family Medicine

## 2019-09-19 ENCOUNTER — Telehealth: Payer: Self-pay

## 2019-09-23 LAB — HM DIABETES EYE EXAM

## 2019-09-29 ENCOUNTER — Telehealth: Payer: Self-pay

## 2019-09-29 NOTE — Telephone Encounter (Signed)
Patient called stating she only has enough rybelsus to last her until Saturday and she called the pharmacy and they told her they didn't have any in stock. I called pharmacy and they stated they have another shipment coming in tomorrow and they should get some rybelsus. Pt advised to check back with pharmacy tomorrow and give [place] a call if they don't have any. YL,RMA

## 2019-09-30 ENCOUNTER — Other Ambulatory Visit: Payer: Self-pay

## 2019-09-30 MED ORDER — ACCU-CHEK GUIDE W/DEVICE KIT: 1.0000 | PACK | Freq: Two times a day (BID) | 1 refills | Status: DC

## 2019-10-01 ENCOUNTER — Other Ambulatory Visit: Payer: Self-pay

## 2019-10-01 DIAGNOSIS — E119 Type 2 diabetes mellitus without complications: Secondary | ICD-10-CM

## 2019-10-01 DIAGNOSIS — E1165 Type 2 diabetes mellitus with hyperglycemia: Secondary | ICD-10-CM

## 2019-10-01 MED ORDER — GLIMEPIRIDE 2 MG PO TABS: 3.0000 mg | ORAL_TABLET | Freq: Every day | ORAL | 1 refills | Status: DC

## 2019-10-01 MED ORDER — ACCU-CHEK SOFT TOUCH LANCETS MISC: 12 refills | Status: DC

## 2019-10-01 MED ORDER — RYBELSUS 3 MG PO TABS: 1.0000 | ORAL_TABLET | Freq: Every day | ORAL | 2 refills | Status: DC

## 2019-10-07 ENCOUNTER — Encounter: Payer: Self-pay | Admitting: Nurse Practitioner

## 2019-10-16 ENCOUNTER — Telehealth: Payer: Self-pay

## 2019-10-16 ENCOUNTER — Ambulatory Visit: Payer: Self-pay

## 2019-10-16 DIAGNOSIS — E1165 Type 2 diabetes mellitus with hyperglycemia: Secondary | ICD-10-CM

## 2019-10-16 DIAGNOSIS — I10 Essential (primary) hypertension: Secondary | ICD-10-CM

## 2019-10-16 NOTE — Chronic Care Management (AMB) (Signed)
  Chronic Care Management   Outreach Note  10/16/2019 Name: Emonee Winkowski MRN: [id number] DOB: 1946-03-11  Referred by: [name], FNP Reason for referral : Chronic Care Management (FU RN CM Inbound call from patient )  Received a voice message from patient today requesting a return call. An unsuccessful telephone outreach was attempted today. The patient was referred to the case management team for assistance with care management and care coordination. Unable to leave a VM due to voicemail box is full.   Follow Up Plan: Telephone follow up appointment with care management team member scheduled for: 11/27/19  [name], RN, BSN, CCM Care Management Coordinator [facility]  Direct Phone: [phone]

## 2019-10-17 ENCOUNTER — Encounter (HOSPITAL_COMMUNITY): Payer: Self-pay

## 2019-10-17 ENCOUNTER — Other Ambulatory Visit: Payer: Self-pay

## 2019-10-17 ENCOUNTER — Ambulatory Visit (HOSPITAL_COMMUNITY)
Admission: EM | Admit: 2019-10-17 | Discharge: 2019-10-17 | Disposition: A | Discharge status: Home / Self Care | Payer: Medicare Other | Attending: Emergency Medicine | Admitting: Emergency Medicine

## 2019-10-17 ENCOUNTER — Ambulatory Visit (INDEPENDENT_AMBULATORY_CARE_PROVIDER_SITE_OTHER): Payer: Medicare Other

## 2019-10-17 DIAGNOSIS — M779 Enthesopathy, unspecified: Secondary | ICD-10-CM

## 2019-10-17 DIAGNOSIS — E1165 Type 2 diabetes mellitus with hyperglycemia: Secondary | ICD-10-CM

## 2019-10-17 DIAGNOSIS — M25521 Pain in right elbow: Secondary | ICD-10-CM

## 2019-10-17 DIAGNOSIS — I10 Essential (primary) hypertension: Secondary | ICD-10-CM

## 2019-10-17 MED ORDER — PREDNISONE 10 MG (21) PO TBPK
ORAL_TABLET | Freq: Every day | ORAL | 0 refills | Status: DC
Start: 2019-10-17 — End: 2019-10-28

## 2019-10-17 MED ORDER — NAPROXEN 500 MG PO TABS
500.0000 mg | ORAL_TABLET | Freq: Two times a day (BID) | ORAL | 0 refills | Status: DC
Start: 2019-10-17 — End: 2021-09-25

## 2019-10-17 NOTE — ED Provider Notes (Addendum)
[facility]    CSN: [id number] Arrival date & time: 10/17/19  0802      History   Chief Complaint Chief Complaint  Patient presents with  . arm swelling    HPI Joanna Sandoval is a 74 y.o. female.   Pain to lt arm elbow area with movement. Has full ROM but pain with moving area. Denies any injury states that she sleeps in a recliner at times and may have had elbow in the chair to log. Has not taken anything pta.      Past Medical History:  Diagnosis Date  . Arthritis   . Diabetes mellitus   . Hypercholesteremia   . Hypertension   . Post-menopausal bleeding     Patient Active Problem List   Diagnosis Date Noted  . Type 2 diabetes mellitus without complication, without long-term current use of insulin ([place]certain) 07/31/2018  . Essential hypertension 04/30/2018  . Uncontrolled type 2 diabetes mellitus with hyperglycemia ([place]) 04/30/2018  . Postmenopausal vaginal bleeding   . Fibroid 11/11/2011  . Postmenopausal bleeding 08/30/2011    Past Surgical History:  Procedure Laterality Date  . CATARACT EXTRACTION    . DILATION AND CURETTAGE OF UTERUS    . HYSTEROSCOPY WITH D & C N/A 05/02/2017   Procedure: DILATATION AND CURETTAGE /HYSTEROSCOPY;  Surgeon: Mora Bellman, MD;  Location: [facility];  Service: Gynecology;  Laterality: N/A;  . KNEE ARTHROSCOPY      OB History    Gravida  4   Para  3   Term  3   Preterm      AB  1   Living  1     SAB  1   TAB      Ectopic      Multiple      Live Births               Home Medications    Prior to Admission medications   Medication Sig Start Date End Date Taking? Authorizing Provider  albuterol (PROVENTIL HFA;VENTOLIN HFA) 108 (90 Base) MCG/ACT inhaler Inhale 2 puffs into the lungs every 6 (six) hours as needed.    [provider]  atenolol (TENORMIN) 100 MG tablet Take 50 mg by mouth daily.     [provider]  atorvastatin (LIPITOR) 40 MG tablet Take 1 tablet  (40 mg total) by mouth daily. Take 1/2 pill at bedtime 05/10/18   [name], FNP  Blood Glucose Monitoring Suppl (ACCU-CHEK GUIDE) w/Device KIT 1 Stick by Does not apply route 2 (two) times daily at 10 AM and 5 PM. 09/30/19   [name], FNP  carboxymethylcellulose (REFRESH PLUS) 0.5 % SOLN 1 drop 3 (three) times daily as needed.    [provider]  D3-50 50000 units capsule  01/14/17   [provider]  diclofenac sodium (VOLTAREN) 1 % GEL Apply 2 g topically 4 (four) times daily. 10/30/18   [name], FNP  glimepiride (AMARYL) 2 MG tablet Take 1.5 tablets (3 mg total) by mouth daily. 10/01/19   [name], FNP  glucose blood (ACCU-CHEK GUIDE) test strip Use as instructed 05/05/19   [name], FNP  [name] 100 MG tablet TAKE 1 TABLET(100 MG) BY MOUTH DAILY 04/26/18   Rodriguez-Southworth, Sunday Spillers, PA-C  [name] lancets Use as instructed 10/01/19   [name], FNP  [name] KIT Use to test blood sugar twice daily as directed. E11.65 05/02/19   [name], FNP  latanoprost (XALATAN) 0.005 % ophthalmic solution 1 drop at bedtime. 05/16/19   [provider]  losartan (COZAAR) 50 MG tablet Take 50 mg by mouth daily.    [provider]  Magnesium 200 MG TABS Take 1 tab by mouth daily with evening meal 07/30/19   [name], FNP  naproxen (NAPROSYN) 500 MG tablet Take 1 tablet (500 mg total) by mouth 2 (two) times daily. 10/17/19   Marney Setting, NP  predniSONE (STERAPRED UNI-PAK 21 TAB) 10 MG (21) TBPK tablet Take by mouth daily. Take 6 tabs by mouth daily  for 2 days, then 5 tabs for 2 days, then 4 tabs for 2 days, then 3 tabs for 2 days, 2 tabs for 2 days, then 1 tab by mouth daily for 2 days 10/17/19   Marney Setting, NP  Semaglutide (RYBELSUS) 3 MG TABS Take 1 tablet by mouth daily. 30 minutes before meals 10/01/19   [name], FNP    Family History Family History  Problem Relation  Age of Onset  . Hypertension Mother   . Hypertension Father   . Cancer Father        ?lung  . Diabetes Sister     Social History Social History   Tobacco Use  . Smoking status: Former Smoker    Types: Cigarettes  . Smokeless tobacco: Never Used  Vaping Use  . Vaping Use: Never used  Substance Use Topics  . Alcohol use: No  . Drug use: No     Allergies   Patient has no known allergies.   Review of Systems Review of Systems  Constitutional: Negative.   Respiratory: Negative.   Cardiovascular: Negative.   Musculoskeletal: Positive for joint swelling.       Rt elbow pain   Neurological:       Tingling pain to rt arm and pain under elbow area intermit      Physical Exam Triage Vital Signs ED Triage Vitals  Enc Vitals Group     BP 10/17/19 0823 (!) 150/71     Pulse Rate 10/17/19 0823 85     Resp 10/17/19 0823 16     Temp 10/17/19 0823 99.2 F (37.3 C)     Temp Source 10/17/19 0823 Oral     SpO2 10/17/19 0823 92 %     Weight 10/17/19 0824 210 lb (95.3 kg)     Height 10/17/19 0824 _0  (1.549 m)     Head Circumference --      Peak Flow --      Pain Score 10/17/19 0823 9     Pain Loc --      Pain Edu? --      Excl. in [place]? --    No data found.  Updated Vital Signs BP (!) 150/71   Pulse 85   Temp 99.2 F (37.3 C) (Oral)   Resp 16   Ht _1  (1.549 m)   Wt 210 lb (95.3 kg)   SpO2 92%   BMI 39.68 kg/m   Visual Acuity Right Eye Distance:   Left Eye Distance:   Bilateral Distance:    Right Eye Near:   Left Eye Near:    Bilateral Near:     Physical Exam Cardiovascular:     Rate and Rhythm: Normal rate.  Pulmonary:     Effort: Pulmonary effort is normal.  Musculoskeletal:        General: Swelling and tenderness present.     Comments: Strong pulses, no  visual edema noted, strong grip strength and pulses. Full ROM, able to make a fist .   Skin:    General: Skin is warm.     Capillary Refill: Capillary refill takes less than 2 seconds.    Neurological:     General: No focal deficit present.     Mental Status: She is alert.      UC Treatments / Results  Labs (all labs ordered are listed, but only abnormal results are displayed) Labs Reviewed - No data to display  EKG   Radiology No results found.  Procedures Procedures (including critical care time)  Medications Ordered in UC Medications - No data to display  Initial Impression / Assessment and Plan / UC Course  I have reviewed the triage vital signs and the nursing notes.  Pertinent labs & imaging results that were available during my care of the patient were reviewed by me and considered in my medical decision making (see chart for details).    May use warm compresses as needed  May have inflammation in the area Take NSADIS as needed for pain  Monitor sugar levels   Final Clinical Impressions(s) / UC Diagnoses   Final diagnoses:  Tendonitis  Right elbow pain     Discharge Instructions     Warm compresses to help  Cont to check sugar levels since medication can raise your sugar May need to follow up with primary care in 1 week to see orthopedic if needed  May use the voltaren gel as needed for pain     ED Prescriptions    Medication Sig Dispense Auth. Provider   naproxen (NAPROSYN) 500 MG tablet Take 1 tablet (500 mg total) by mouth 2 (two) times daily. 30 tablet Morley Kos L, NP   predniSONE (STERAPRED UNI-PAK 21 TAB) 10 MG (21) TBPK tablet Take by mouth daily. Take 6 tabs by mouth daily  for 2 days, then 5 tabs for 2 days, then 4 tabs for 2 days, then 3 tabs for 2 days, 2 tabs for 2 days, then 1 tab by mouth daily for 2 days 42 tablet Marney Setting, NP     PDMP not reviewed this encounter.   Marney Setting, NP 10/17/19 1025    Marney Setting, NP 10/17/19 1026

## 2019-10-17 NOTE — Discharge Instructions (Addendum)
Warm compresses to help  Cont to check sugar levels since medication can raise your sugar May need to follow up with primary care in 1 week to see orthopedic if needed  May use the voltaren gel as needed for pain

## 2019-10-17 NOTE — ED Triage Notes (Signed)
Pt states she woke up 3 days ago with her right arm swollen and stabbing pain in right arm. Pt has 1+ swelling of right arm from elbow down to forearm. Pt states she sleeps in a recliner. Pt c/o 8/10 sharp pain in right arm.

## 2019-10-21 NOTE — Chronic Care Management (AMB) (Signed)
Chronic Care Management   Follow Up Note   10/17/2019 Name: Joanna Sandoval MRN: [id number] DOB: 06/29/1945  Referred by: Joanna Brine, FNP Reason for referral : Chronic Care Management (FU RN CM Inbound call from patient )   Joanna Sandoval is a 74 y.o. year old female who is a primary care patient of Joanna Sandoval, [place]. The CCM team was consulted for assistance with chronic disease management and care coordination needs.    Review of patient status, including review of consultants reports, relevant laboratory and other test results, and collaboration with appropriate care team members and the patient's provider was performed as part of comprehensive patient evaluation and provision of chronic care management services.    SDOH (Social Determinants of Health) assessments performed: Yes - no acute challenges  See Care Plan activities for detailed interventions related to [place])   Placed outbound call to patient in response to inbound call received from patient concerning a new dx of tendonitis.     Outpatient Encounter Medications as of 10/17/2019  Medication Sig  . glimepiride (AMARYL) 2 MG tablet Take 1.5 tablets (3 mg total) by mouth daily.  [name] [name] 100 MG tablet TAKE 1 TABLET(100 MG) BY MOUTH DAILY  . naproxen (NAPROSYN) 500 MG tablet Take 1 tablet (500 mg total) by mouth 2 (two) times daily.  . predniSONE (STERAPRED UNI-PAK 21 TAB) 10 MG (21) TBPK tablet Take by mouth daily. Take 6 tabs by mouth daily  for 2 days, then 5 tabs for 2 days, then 4 tabs for 2 days, then 3 tabs for 2 days, 2 tabs for 2 days, then 1 tab by mouth daily for 2 days  . Semaglutide (RYBELSUS) 3 MG TABS Take 1 tablet by mouth daily. 30 minutes before meals  . albuterol (PROVENTIL HFA;VENTOLIN HFA) 108 (90 Base) MCG/ACT inhaler Inhale 2 puffs into the lungs every 6 (six) hours as needed.  [name] atenolol (TENORMIN) 100 MG tablet Take 50 mg by mouth daily.   [name] atorvastatin (LIPITOR) 40 MG tablet Take 1 tablet (40 mg  total) by mouth daily. Take 1/2 pill at bedtime  . Blood Glucose Monitoring Suppl (ACCU-CHEK GUIDE) w/Device KIT 1 Stick by Does not apply route 2 (two) times daily at 10 AM and 5 PM.  . carboxymethylcellulose (REFRESH PLUS) 0.5 % SOLN 1 drop 3 (three) times daily as needed.  . D3-50 50000 units capsule   . diclofenac sodium (VOLTAREN) 1 % GEL Apply 2 g topically 4 (four) times daily.  [name] glucose blood (ACCU-CHEK GUIDE) test strip Use as instructed  . [name] lancets Use as instructed  . [name] KIT Use to test blood sugar twice daily as directed. E11.65  . latanoprost (XALATAN) 0.005 % ophthalmic solution 1 drop at bedtime.  [name] losartan (COZAAR) 50 MG tablet Take 50 mg by mouth daily.  . Magnesium 200 MG TABS Take 1 tab by mouth daily with evening meal   No facility-administered encounter medications on file as of 10/17/2019.     Objective:  Lab Results  Component Value Date   HGBA1C 10.9 (H) 07/30/2019   HGBA1C 8.1 (H) 05/08/2019   HGBA1C 7.4 (H) 01/30/2019   Lab Results  Component Value Date   MICROALBUR 30 07/30/2019   LDLCALC 149 (H) 07/30/2019   CREATININE 1.15 (H) 07/30/2019   BP Readings from Last 3 Encounters:  10/17/19 (!) 150/71  07/30/19 138/78  07/30/19 138/78    Goals Addressed  Patient Stated   .  "I am scheduled to have dental procedure" (pt-stated)        CARE PLAN ENTRY (see longitudinal plan of care for additional care plan information)  Current Barriers:  [name] Knowledge Deficits related to post operative care of tooth extraction  . Chronic Disease Management support and education needs related to DM, Essential HTN  Nurse Case Manager Clinical Goal(s):  [name] Over the next 30 days, patient will work with CCM RN and Dental team to address needs related to optimal recovery from tooth extraction w/o complications  CCM RN CM Interventions:  10/17/19 call completed with patient  . Inter-disciplinary care  team collaboration (see longitudinal plan of care) . Evaluation of current treatment plan related to dental decay and patient's adherence to plan as established by provider . Determined patient is scheduled to f/u with [name] DDS on 10/24/19 for a tooth extraction secondary to dental decay . Advised patient to contact her dentist to advise of newly prescribed medications for treatment of Tendonisitis . Discussed plans with patient for ongoing care management follow up and provided patient with direct contact information for care management team  Patient Self Care Activities:  . Self administers medications as prescribed . Attends all scheduled provider appointments . Calls pharmacy for medication refills . Performs ADL's independently . Performs IADL's independently . Calls provider office for new concerns or questions  Initial goal documentation     .  "I was diagnosed with tendonitis" (pt-stated)        CARE PLAN ENTRY (see longitudinal plan of care for additional care plan information)  Current Barriers:  [name] Knowledge Deficits related to evaluation and treatment of Tendonitis of Rt arm . Chronic Disease Management support and education needs related to DM, Essential HTN  Nurse Case Manager Clinical Goal(s):  [name] Over the next 90 days, patient will work with the CCM team and PCP to address needs related to disease education and support for treatment management of Tendonitis.   CCM RN CM Interventions:  10/17/19 call completed with patient  . Inter-disciplinary care team collaboration (see longitudinal plan of care) . Evaluation of current treatment plan related to Tendonitis  and patient's adherence to plan as established by provider. . Provided education to patient re: disease process related to Tendonitis; Determined patient went to Urgent Care for diagnosis/treatment of this condition with the following Assessment/Plan reviewed and discussed: o Warm compresses to help  o Cont to  check sugar levels since medication can raise your sugar o May need to follow up with primary care in 1 week to see orthopedic if needed  o May use the voltaren gel as needed for pain   o  o  o  o  o  o  o  ED Prescriptions  o    o Medication o Sig o Dispense o Auth. Provider o    o naproxen (NAPROSYN) 500 MG tablet o Take 1 tablet (500 mg total) by mouth 2 (two) times daily. o 30 tablet o Mitchell, Melanie L, NP o    o predniSONE (STERAPRED UNI-PAK 21 TAB) 10 MG (21) TBPK tablet o Take by mouth daily. Take 6 tabs by mouth daily  for 2 days, then 5 tabs for 2 days, then 4 tabs for 2 days, then 3 tabs for 2 days, 2 tabs for 2 days, then 1 tab by mouth daily for 2 days o 42 tablet o  o   . Reviewed medications with patient and discussed  indication, dosage and frequency of the prescribed medications for treatment management of Tendonitis; Educated patient on potential SE of Naproxen related to adverse GI events; Educated patient on potential for elevated blood glucose levels secondary to taking Prednisone   . Encouraged patient to notify her dentist of the newly prescribed medications due to upcoming dental procedure for a tooth extraction  . Discussed plans with patient for ongoing care management follow up and provided patient with direct contact information for care management team  Patient Self Care Activities:  . Self administers medications as prescribed . Attends all scheduled provider appointments . Calls pharmacy for medication refills . Performs ADL's independently . Performs IADL's independently . Calls provider office for new concerns or questions  Initial goal documentation       Plan:   Telephone follow up appointment with care management team member scheduled for: 10/27/19  [name], RN, BSN, CCM Care Management Coordinator [facility]  Direct Phone: [phone]

## 2019-10-21 NOTE — Patient Instructions (Signed)
Visit Information  Goals Addressed      Patient Stated   .  "I am scheduled to have dental procedure" (pt-stated)        CARE PLAN ENTRY (see longitudinal plan of care for additional care plan information)  Current Barriers:  [name] Knowledge Deficits related to post operative care of tooth extraction  . Chronic Disease Management support and education needs related to DM, Essential HTN  Nurse Case Manager Clinical Goal(s):  [name] Over the next 30 days, patient will work with CCM RN and Dental team to address needs related to optimal recovery from tooth extraction w/o complications  CCM RN CM Interventions:  10/17/19 call completed with patient  . Inter-disciplinary care team collaboration (see longitudinal plan of care) . Evaluation of current treatment plan related to dental decay and patient's adherence to plan as established by provider . Determined patient is scheduled to f/u with [name] DDS on 10/24/19 for a tooth extraction secondary to dental decay . Advised patient to contact her dentist to advise of newly prescribed medications for treatment of Tendonisitis . Discussed plans with patient for ongoing care management follow up and provided patient with direct contact information for care management team  Patient Self Care Activities:  . Self administers medications as prescribed . Attends all scheduled provider appointments . Calls pharmacy for medication refills . Performs ADL's independently . Performs IADL's independently . Calls provider office for new concerns or questions  Initial goal documentation     .  "I was diagnosed with tendonitis" (pt-stated)        CARE PLAN ENTRY (see longitudinal plan of care for additional care plan information)  Current Barriers:  [name] Knowledge Deficits related to evaluation and treatment of Tendonitis of Rt arm . Chronic Disease Management support and education needs related to DM, Essential HTN  Nurse Case Manager Clinical Goal(s):   [name] Over the next 90 days, patient will work with the CCM team and PCP to address needs related to disease education and support for treatment management of Tendonitis.   CCM RN CM Interventions:  10/17/19 call completed with patient  . Inter-disciplinary care team collaboration (see longitudinal plan of care) . Evaluation of current treatment plan related to Tendonitis  and patient's adherence to plan as established by provider. . Provided education to patient re: disease process related to Tendonitis; Determined patient went to Urgent Care for diagnosis/treatment of this condition with the following Assessment/Plan reviewed and discussed: o Warm compresses to help  o Cont to check sugar levels since medication can raise your sugar o May need to follow up with primary care in 1 week to see orthopedic if needed  o May use the voltaren gel as needed for pain   o  o  o  o  o  o  o  ED Prescriptions  o    o Medication o Sig o Dispense o Auth. Provider o    o naproxen (NAPROSYN) 500 MG tablet o Take 1 tablet (500 mg total) by mouth 2 (two) times daily. o 30 tablet o Mitchell, Melanie L, NP o    o predniSONE (STERAPRED UNI-PAK 21 TAB) 10 MG (21) TBPK tablet o Take by mouth daily. Take 6 tabs by mouth daily  for 2 days, then 5 tabs for 2 days, then 4 tabs for 2 days, then 3 tabs for 2 days, 2 tabs for 2 days, then 1 tab by mouth daily for 2 days o 42 tablet o  o   .  Reviewed medications with patient and discussed indication, dosage and frequency of the prescribed medications for treatment management of Tendonitis; Educated patient on potential SE of Naproxen related to adverse GI events; Educated patient on potential for elevated blood glucose levels secondary to taking Prednisone   . Encouraged patient to notify her dentist of the newly prescribed medications due to upcoming dental procedure for a tooth extraction  . Discussed plans with patient for ongoing care management follow up and provided patient  with direct contact information for care management team  Patient Self Care Activities:  . Self administers medications as prescribed . Attends all scheduled provider appointments . Calls pharmacy for medication refills . Performs ADL's independently . Performs IADL's independently . Calls provider office for new concerns or questions  Initial goal documentation       Patient verbalizes understanding of instructions provided today.   Telephone follow up appointment with care management team member scheduled for: 10/27/19  [name], RN, BSN, CCM Care Management Coordinator [place] [facility]  Direct Phone: [phone]

## 2019-10-23 ENCOUNTER — Encounter: Payer: Self-pay | Admitting: Nurse Practitioner

## 2019-10-27 ENCOUNTER — Ambulatory Visit: Payer: Self-pay

## 2019-10-27 ENCOUNTER — Other Ambulatory Visit: Payer: Self-pay

## 2019-10-27 ENCOUNTER — Telehealth: Payer: Medicare Other

## 2019-10-27 DIAGNOSIS — I10 Essential (primary) hypertension: Secondary | ICD-10-CM

## 2019-10-27 DIAGNOSIS — E1165 Type 2 diabetes mellitus with hyperglycemia: Secondary | ICD-10-CM

## 2019-10-29 ENCOUNTER — Encounter: Payer: Self-pay | Admitting: Nurse Practitioner

## 2019-10-29 ENCOUNTER — Other Ambulatory Visit: Payer: Self-pay

## 2019-10-29 ENCOUNTER — Ambulatory Visit (INDEPENDENT_AMBULATORY_CARE_PROVIDER_SITE_OTHER): Payer: Medicare Other | Admitting: Nurse Practitioner

## 2019-10-29 VITALS — BP 124/72 | HR 88 | Temp 97.6°F | Ht 61.0 in | Wt 213.0 lb

## 2019-10-29 DIAGNOSIS — R251 Tremor, unspecified: Secondary | ICD-10-CM | POA: Diagnosis not present

## 2019-10-29 DIAGNOSIS — M79601 Pain in right arm: Secondary | ICD-10-CM | POA: Diagnosis not present

## 2019-10-29 DIAGNOSIS — I10 Essential (primary) hypertension: Secondary | ICD-10-CM | POA: Diagnosis not present

## 2019-10-29 DIAGNOSIS — E1165 Type 2 diabetes mellitus with hyperglycemia: Secondary | ICD-10-CM | POA: Diagnosis not present

## 2019-10-29 NOTE — Chronic Care Management (AMB) (Signed)
Chronic Care Management   Follow Up Note   10/28/2019 Name: Joanna Sandoval MRN: [id number] DOB: 06/18/1945  Referred by: Joanna Sandoval, [place] Reason for referral : Chronic Care Management (FU RN CM Call )   Joanna Sandoval is a 74 y.o. year old female who is a primary care patient of Joanna Sandoval, [place]. The CCM team was consulted for assistance with chronic disease management and care coordination needs.    Review of patient status, including review of consultants reports, relevant laboratory and other test results, and collaboration with appropriate care team members and the patient's provider was performed as part of comprehensive patient evaluation and provision of chronic care management services.    SDOH (Social Determinants of Health) assessments performed: Yes - no new acute challenges See Care Plan activities for detailed interventions related to [place])   Placed outbound CCM RN CM call to patient for f/u on s/p tooth extraction.     Outpatient Encounter Medications as of 10/27/2019  Medication Sig  . albuterol (PROVENTIL HFA;VENTOLIN HFA) 108 (90 Base) MCG/ACT inhaler Inhale 2 puffs into the lungs every 6 (six) hours as needed.  Joanna Sandoval atenolol (TENORMIN) 100 MG tablet Take 50 mg by mouth daily.   Joanna Sandoval atorvastatin (LIPITOR) 40 MG tablet Take 1 tablet (40 mg total) by mouth daily. Take 1/2 pill at bedtime  . Blood Glucose Monitoring Suppl (ACCU-CHEK GUIDE) w/Device KIT 1 Stick by Does not apply route 2 (two) times daily at 10 AM and 5 PM.  . carboxymethylcellulose (REFRESH PLUS) 0.5 % SOLN 1 drop 3 (three) times daily as needed.  . D3-50 50000 units capsule   . diclofenac sodium (VOLTAREN) 1 % GEL Apply 2 g topically 4 (four) times daily.  Joanna Sandoval glimepiride (AMARYL) 2 MG tablet Take 1.5 tablets (3 mg total) by mouth daily.  Joanna Sandoval glucose blood (ACCU-CHEK GUIDE) test strip Use as instructed  . [name] 100 MG tablet TAKE 1 TABLET(100 MG) BY MOUTH DAILY  . [name] lancets  Use as instructed  . [name] KIT Use to test blood sugar twice daily as directed. E11.65  . latanoprost (XALATAN) 0.005 % ophthalmic solution 1 drop at bedtime.  Joanna Sandoval losartan (COZAAR) 50 MG tablet Take 50 mg by mouth daily.  . Magnesium 200 MG TABS Take 1 tab by mouth daily with evening meal  . naproxen (NAPROSYN) 500 MG tablet Take 1 tablet (500 mg total) by mouth 2 (two) times daily.  . Semaglutide (RYBELSUS) 3 MG TABS Take 1 tablet by mouth daily. 30 minutes before meals  . [DISCONTINUED] predniSONE (STERAPRED UNI-PAK 21 TAB) 10 MG (21) TBPK tablet Take by mouth daily. Take 6 tabs by mouth daily  for 2 days, then 5 tabs for 2 days, then 4 tabs for 2 days, then 3 tabs for 2 days, 2 tabs for 2 days, then 1 tab by mouth daily for 2 days   No facility-administered encounter medications on file as of 10/27/2019.     Objective:  Lab Results  Component Value Date   HGBA1C 10.9 (H) 07/30/2019   HGBA1C 8.1 (H) 05/08/2019   HGBA1C 7.4 (H) 01/30/2019   Lab Results  Component Value Date   MICROALBUR 30 07/30/2019   LDLCALC 149 (H) 07/30/2019   CREATININE 1.15 (H) 07/30/2019   BP Readings from Last 3 Encounters:  10/29/19 124/72  10/17/19 (!) 150/71  07/30/19 138/78    Goals Addressed      Patient Stated   .  COMPLETED: "  I am scheduled to have dental procedure" (pt-stated)        CARE PLAN ENTRY (see longitudinal plan of care for additional care plan information)  Current Barriers:  Joanna Sandoval Knowledge Deficits related to post operative care of tooth extraction  . Chronic Disease Management support and education needs related to DM, Essential HTN  Nurse Case Manager Clinical Goal(s):  Joanna Sandoval Over the next 30 days, patient will work with CCM RN and Dental team to address needs related to optimal recovery from tooth extraction w/o complications  CCM RN CM Interventions:  10/17/19 call completed with patient  . Inter-disciplinary care team collaboration (see  longitudinal plan of care) . Evaluation of current treatment plan related to dental decay and patient's adherence to plan as established by provider . Determined patient is scheduled to f/u with Dr. Kirby Sandoval DDS on 10/24/19 for a tooth extraction secondary to dental decay . Advised patient to contact her dentist to advise of newly prescribed medications for treatment of Tendonisitis . Discussed plans with patient for ongoing care management follow up and provided patient with direct contact information for care management team 10/28/19 call completed with patient  . Placed outbound call to patient s/p tooth extraction  . Assessed for healing and recovery, determined patient is recovering without pain, bleeding or evidence of infection . Determined patient has a post operative f/u scheduled with her dentist, she states, "I am doing great" . Determined patient has provider's contact number and verbalizes understanding of when to call the doctor if needed to report any status change or other concerns if they occur . Discussed plans with patient for ongoing care management follow up and provided patient with direct contact information for care management team  Patient Self Care Activities:  . Self administers medications as prescribed . Attends all scheduled provider appointments . Calls pharmacy for medication refills . Performs ADL's independently . Performs IADL's independently . Calls provider office for new concerns or questions  Please see past updates related to this goal by clicking on the "Past Updates" button in the selected goal        Plan:   Telephone follow up appointment with care management team member scheduled for: 11/27/19  [name], RN, BSN, CCM Care Management Coordinator [place] [facility]  Direct Phone: [phone]

## 2019-10-29 NOTE — Patient Instructions (Signed)
Visit Information  Goals Addressed      Patient Stated   .  COMPLETED: "I am scheduled to have dental procedure" (pt-stated)        CARE PLAN ENTRY (see longitudinal plan of care for additional care plan information)  Current Barriers:  [name] Knowledge Deficits related to post operative care of tooth extraction  . Chronic Disease Management support and education needs related to DM, Essential HTN  Nurse Case Manager Clinical Goal(s):  [name] Over the next 30 days, patient will work with CCM RN and Dental team to address needs related to optimal recovery from tooth extraction w/o complications  CCM RN CM Interventions:  10/17/19 call completed with patient  . Inter-disciplinary care team collaboration (see longitudinal plan of care) . Evaluation of current treatment plan related to dental decay and patient's adherence to plan as established by provider . Determined patient is scheduled to f/u with [name] DDS on 10/24/19 for a tooth extraction secondary to dental decay . Advised patient to contact her dentist to advise of newly prescribed medications for treatment of Tendonisitis . Discussed plans with patient for ongoing care management follow up and provided patient with direct contact information for care management team 10/28/19 call completed with patient  . Placed outbound call to patient s/p tooth extraction  . Assessed for healing and recovery, determined patient is recovering without pain, bleeding or evidence of infection . Determined patient has a post operative f/u scheduled with her dentist, she states, "I am doing great" . Determined patient has provider's contact number and verbalizes understanding of when to call the doctor if needed to report any status change or other concerns if they occur . Discussed plans with patient for ongoing care management follow up and provided patient with direct contact information for care management team  Patient Self Care Activities:  . Self  administers medications as prescribed . Attends all scheduled provider appointments . Calls pharmacy for medication refills . Performs ADL's independently . Performs IADL's independently . Calls provider office for new concerns or questions  Please see past updates related to this goal by clicking on the "Past Updates" button in the selected goal        Patient verbalizes understanding of instructions provided today.   Telephone follow up appointment with care management team member scheduled for: 11/27/19  [name], RN, BSN, CCM Care Management Coordinator [place] [facility]  Direct Phone: [phone]

## 2019-10-29 NOTE — Progress Notes (Signed)
This visit occurred during the SARS-CoV-2 public health emergency.  Safety protocols were in place, including screening questions prior to the visit, additional usage of staff PPE, and extensive cleaning of exam room while observing appropriate contact time as indicated for disinfecting solutions.  Subjective:     Patient ID: Joanna Sandoval , female    DOB: 1945-08-30 , 74 y.o.   MRN: [id number]   Chief Complaint  Patient presents with  . Hypertension  . Diabetes    HPI  Patient is here today for diabetes and hypertension check. Advice given on eating in between meals  Wt Readings from Last 3 Encounters: 10/29/19 : 213 lb (96.6 kg) 10/17/19 : 210 lb (95.3 kg) 07/30/19 : 211 lb 9.6 oz (96 kg) Pt states she walks every other day 8 minutes. She will try to increase it to 15 minutes Patient complaining of hand tremors when holding objects, difficulty when pouring drinks, holding spoons and forks. Sometimes drops food off the fork when eating.  Diabetes She presents for her follow-up diabetic visit. She has type 2 diabetes mellitus. Her disease course has been worsening. Pertinent negatives for hypoglycemia include no dizziness or headaches. Pertinent negatives for diabetes include no fatigue, no polydipsia, no polyphagia and no polyuria. ([place] ) There are no diabetic complications. Risk factors for coronary artery disease include obesity, hypertension, diabetes mellitus, dyslipidemia, sedentary lifestyle and post-menopausal. Current diabetic treatment includes oral agent (dual therapy). She is compliant with treatment most of the time. Her weight is stable. She is following a generally unhealthy diet. When asked about meal planning, she reported none. She has not had a previous visit with a dietitian. She participates in exercise every other day. Her home blood glucose trend is decreasing steadily. (One day blood sugar 67 had grandson go to the store to purchase orange juice. This past   Sunday blood sugar 96 highest that her sugar has been 280. Takes medications everyday as scheduled. Has an issue with rybelsus don't like waiting 30 minutes to eat) She does not see a podiatrist.Eye exam is current (done 09/22/2019 neg retinopathy).     Past Medical History:  Diagnosis Date  . Arthritis   . Diabetes mellitus   . Hypercholesteremia   . Hypertension   . Post-menopausal bleeding      Family History  Problem Relation Age of Onset  . Hypertension Mother   . Hypertension Father   . Cancer Father        ?lung  . Diabetes Sister      Current Outpatient Medications:  .  albuterol (PROVENTIL HFA;VENTOLIN HFA) 108 (90 Base) MCG/ACT inhaler, Inhale 2 puffs into the lungs every 6 (six) hours as needed., Disp: , Rfl:  .  atenolol (TENORMIN) 100 MG tablet, Take 50 mg by mouth daily. , Disp: , Rfl:  .  atorvastatin (LIPITOR) 40 MG tablet, Take 1 tablet (40 mg total) by mouth daily. Take 1/2 pill at bedtime, Disp: 90 tablet, Rfl: 1 .  Blood Glucose Monitoring Suppl (ACCU-CHEK GUIDE) w/Device KIT, 1 Stick by Does not apply route 2 (two) times daily at 10 AM and 5 PM., Disp: 1 kit, Rfl: 1 .  carboxymethylcellulose (REFRESH PLUS) 0.5 % SOLN, 1 drop 3 (three) times daily as needed., Disp: , Rfl:  .  D3-50 50000 units capsule, , Disp: , Rfl: 0 .  diclofenac sodium (VOLTAREN) 1 % GEL, Apply 2 g topically 4 (four) times daily., Disp: 100 g, Rfl: 1 .  glimepiride (AMARYL) 2 MG tablet,  Take 1.5 tablets (3 mg total) by mouth daily., Disp: 135 tablet, Rfl: 1 .  glucose blood (ACCU-CHEK GUIDE) test strip, Use as instructed, Disp: 100 each, Rfl: 12 .  [name] 100 MG tablet, TAKE 1 TABLET(100 MG) BY MOUTH DAILY, Disp: 90 tablet, Rfl: 1 .  [name] lancets, Use as instructed, Disp: 100 each, Rfl: 12 .  [name] KIT, Use to test blood sugar twice daily as directed. E11.65, Disp: 1 kit, Rfl: 12 .  latanoprost (XALATAN) 0.005 % ophthalmic  solution, 1 drop at bedtime., Disp: , Rfl:  .  losartan (COZAAR) 50 MG tablet, Take 50 mg by mouth daily., Disp: , Rfl:  .  Magnesium 200 MG TABS, Take 1 tab by mouth daily with evening meal, Disp: 30 tablet, Rfl: 2 .  naproxen (NAPROSYN) 500 MG tablet, Take 1 tablet (500 mg total) by mouth 2 (two) times daily., Disp: 30 tablet, Rfl: 0 .  Semaglutide (RYBELSUS) 3 MG TABS, Take 1 tablet by mouth daily. 30 minutes before meals, Disp: 30 tablet, Rfl: 2   No Known Allergies   Review of Systems  Constitutional: Negative.  Negative for fatigue.  HENT: Negative.   Eyes: Negative.   Respiratory: Negative.   Cardiovascular: Negative.   Gastrointestinal: Negative.   Endocrine: Negative for polydipsia, polyphagia and polyuria.  Musculoskeletal: Negative.   Skin: Negative.   Neurological: Negative for dizziness and headaches.  Psychiatric/Behavioral: Negative.      Today's Vitals   10/29/19 0842  BP: 124/72  Pulse: 88  Temp: 97.6 F (36.4 C)  TempSrc: Oral  Weight: 213 lb (96.6 kg)  Height: '5\' 1"'$  (1.549 m)   Body mass index is 40.25 kg/m.   Objective:  Physical Exam Vitals reviewed.  Constitutional:      General: She is not in acute distress.    Appearance: Normal appearance. She is obese.  HENT:     Head: Normocephalic.     Right Ear: Tympanic membrane, ear canal and external ear normal. There is no impacted cerumen.     Left Ear: Tympanic membrane, ear canal and external ear normal. There is no impacted cerumen.     Nose: Nose normal. No congestion.     Mouth/Throat:     Mouth: Mucous membranes are moist.  Eyes:     Extraocular Movements: Extraocular movements intact.     Conjunctiva/sclera: Conjunctivae normal.     Pupils: Pupils are equal, round, and reactive to light.  Cardiovascular:     Rate and Rhythm: Normal rate and regular rhythm.     Pulses: Normal pulses.     Heart sounds: Normal heart sounds. No murmur heard.   Pulmonary:     Effort: Pulmonary effort is  normal. No respiratory distress.     Breath sounds: Normal breath sounds. No wheezing.  Skin:    General: Skin is warm and dry.     Capillary Refill: Capillary refill takes less than 2 seconds.  Neurological:     General: No focal deficit present.     Mental Status: She is alert and oriented to person, place, and time.     Cranial Nerves: No cranial nerve deficit.  Psychiatric:        Mood and Affect: Mood normal.        Behavior: Behavior normal.        Thought Content: Thought content normal.        Judgment: Judgment normal.  Assessment And Plan:  1. Essential hypertension . B/P is controlled.  . CMP ordered to check renal function.  . The importance of regular exercise increasing to at least 15 minutes a day and dietary modification was stressed to the patient.   2. Uncontrolled type 2 diabetes mellitus with hyperglycemia (HCC)  Chronic, elevated HgbA1c at last visit  Continue with current medications pending lab results  Stressed the importance of not eating sugary cereals   3. Pain of right upper extremity  She was treated for tendonitis with prednisone  She is advised to take Naproxen as needed for her arm pain.    4. Tremors of nervous system  Likely essential tremors however will check labs and advised her to check her blood sugar when it is occurring to ensure not had hypoglycemia.  Patient was given opportunity to ask questions. Patient verbalized understanding of the plan and was able to repeat key elements of the plan. All questions were answered to their satisfaction.  [name], FNP   I, [name], FNP, have reviewed all documentation for this visit. The documentation on 10/29/19 for the exam, diagnosis, procedures, and orders are all accurate and complete.   THE PATIENT IS ENCOURAGED TO PRACTICE SOCIAL DISTANCING DUE TO THE COVID-19 PANDEMIC.

## 2019-10-30 LAB — CMP14+EGFR
ALT: 20 IU/L (ref 0–32)
AST: 14 IU/L (ref 0–40)
Albumin/Globulin Ratio: 1.5 (ref 1.2–2.2)
Albumin: 3.8 g/dL (ref 3.7–4.7)
Alkaline Phosphatase: 96 IU/L (ref 48–121)
BUN/Creatinine Ratio: 23 (ref 12–28)
BUN: 21 mg/dL (ref 8–27)
Bilirubin Total: 0.6 mg/dL (ref 0.0–1.2)
CO2: 29 mmol/L (ref 20–29)
Calcium: 9.4 mg/dL (ref 8.7–10.3)
Chloride: 103 mmol/L (ref 96–106)
Creatinine, Ser: 0.9 mg/dL (ref 0.57–1.00)
GFR calc Af Amer: 74 mL/min/{1.73_m2} (ref >59)
GFR calc non Af Amer: 64 mL/min/{1.73_m2} (ref >59)
Globulin, Total: 2.5 g/dL (ref 1.5–4.5)
Glucose: 228 mg/dL — ABNORMAL HIGH (ref 65–99)
Potassium: 3.7 mmol/L (ref 3.5–5.2)
Sodium: 144 mmol/L (ref 134–144)
Total Protein: 6.3 g/dL (ref 6.0–8.5)

## 2019-10-30 LAB — LIPID PANEL
Chol/HDL Ratio: 2.2 ratio (ref 0.0–4.4)
Cholesterol, Total: 151 mg/dL (ref 100–199)
HDL: 68 mg/dL (ref >39)
LDL Chol Calc (NIH): 56 mg/dL (ref 0–99)
Triglycerides: 165 mg/dL — ABNORMAL HIGH (ref 0–149)
VLDL Cholesterol Cal: 27 mg/dL (ref 5–40)

## 2019-10-30 LAB — HEMOGLOBIN A1C
Est. average glucose Bld gHb Est-mCnc: 189 mg/dL
Hgb A1c MFr Bld: 8.2 % — ABNORMAL HIGH (ref 4.8–5.6)

## 2019-11-09 ENCOUNTER — Other Ambulatory Visit: Payer: Self-pay | Admitting: Nurse Practitioner

## 2019-11-09 DIAGNOSIS — E1165 Type 2 diabetes mellitus with hyperglycemia: Secondary | ICD-10-CM

## 2019-11-12 ENCOUNTER — Telehealth: Payer: Self-pay

## 2019-11-12 NOTE — Telephone Encounter (Signed)
Patient notified of her results. YL,RMA

## 2019-11-27 ENCOUNTER — Telehealth: Payer: Self-pay

## 2019-11-27 NOTE — Telephone Encounter (Signed)
  Chronic Care Management   Outreach Note  11/27/2019 Name: Joanna Sandoval MRN: [id number] DOB: 1945-07-06  Referred by: [name], FNP Reason for referral : No chief complaint on file.   An unsuccessful telephone outreach was attempted today. The patient was referred to the case management team for assistance with care management and care coordination.   Follow Up Plan: Telephone follow up appointment with care management team member scheduled for: 01/05/20  [name], RN, BSN, CCM Care Management Coordinator [place] [facility]  Direct Phone: [phone]

## 2019-12-10 ENCOUNTER — Other Ambulatory Visit: Payer: Self-pay | Admitting: Nurse Practitioner

## 2019-12-15 DIAGNOSIS — E119 Type 2 diabetes mellitus without complications: Secondary | ICD-10-CM | POA: Diagnosis not present

## 2019-12-15 DIAGNOSIS — E785 Hyperlipidemia, unspecified: Secondary | ICD-10-CM | POA: Diagnosis not present

## 2019-12-15 DIAGNOSIS — I10 Essential (primary) hypertension: Secondary | ICD-10-CM | POA: Diagnosis not present

## 2019-12-15 DIAGNOSIS — M1990 Unspecified osteoarthritis, unspecified site: Secondary | ICD-10-CM | POA: Diagnosis not present

## 2020-01-05 ENCOUNTER — Ambulatory Visit: Payer: Self-pay

## 2020-01-05 ENCOUNTER — Other Ambulatory Visit: Payer: Self-pay

## 2020-01-05 ENCOUNTER — Telehealth: Payer: Medicare Other

## 2020-01-05 DIAGNOSIS — E1165 Type 2 diabetes mellitus with hyperglycemia: Secondary | ICD-10-CM

## 2020-01-05 DIAGNOSIS — I10 Essential (primary) hypertension: Secondary | ICD-10-CM

## 2020-01-07 NOTE — Patient Instructions (Addendum)
Visit Information  Goals Addressed      Patient Stated   .  "I need to lower my Triglycerides" (pt-stated)        CARE PLAN ENTRY (see longitudinal plan of care for additional care plan information)  Current Barriers:  [name] Knowledge Deficits related to disease process and Self Health management of Hypertriglyceridemia . Chronic Disease Management support and education needs related to DM, Essential HTN  Nurse Case Manager Clinical Goal(s):  [name] Over the next 180 days, patient will work with the CCM team and PCP to address needs related to disease education and support for improved Self Health management of Hypertriglyceridemia   CCM RN CM Interventions:  01/05/20 call completed with patient  . Inter-disciplinary care team collaboration (see longitudinal plan of care) . Evaluation of current treatment plan related to Hypertriglyceridemia and patient's adherence to plan as established by provider. . Provided education to patient re: target Triglycerides <100; educated on current level of 165; Educated on dietary recommendations . Discussed plans with patient for ongoing care management follow up and provided patient with direct contact information for care management team . Provided patient with printed educational materials related to "Triglycerides: Why Do They Matter?"; "13 Simple Ways to Lower Your Triglycerides"  Patient Self Care Activities:  . Self administers medications as prescribed . Attends all scheduled provider appointments . Calls pharmacy for medication refills . Calls provider office for new concerns or questions  Initial goal documentation     .  COMPLETED: "I was diagnosed with tendonitis" (pt-stated)        CARE PLAN ENTRY (see longitudinal plan of care for additional care plan information)  Current Barriers:  [name] Knowledge Deficits related to evaluation and treatment of Tendonitis of Rt arm . Chronic Disease Management support and education needs related to DM,  Essential HTN  Nurse Case Manager Clinical Goal(s):  [name] Over the next 90 days, patient will work with the CCM team and PCP to address needs related to disease education and support for treatment management of Tendonitis.   CCM RN CM Interventions:  01/05/20 call completed with patient  . Inter-disciplinary care team collaboration (see longitudinal plan of care) . Evaluation of current treatment plan related to Tendonitis  and patient's adherence to plan as established by provider . Determined patient is currently not experiencing pain or discomfort secondary to Tendonitis previously reported . Discussed plans with patient for ongoing care management follow up and provided patient with direct contact information for care management team  Patient Self Care Activities:  . Self administers medications as prescribed . Attends all scheduled provider appointments . Calls pharmacy for medication refills . Performs ADL's independently . Performs IADL's independently . Calls provider office for new concerns or questions  Please see past updates related to this goal by clicking on the "Past Updates" button in the selected goal      .  "I would like for my Diabetes to be better managed" (pt-stated)   On track     Current Barriers:  [name] Knowledge Deficits related to disease process and Self Health management of DM . Chronic Disease Management support and education needs related to DM, Essential HTN  Nurse Case Manager & PharmD Clinical Goal(s):  [name] New 01/05/20 Over the next 180 days, patient will continue to work with the CCM team and PCP on disease education and support for improved Self Health management of Diabetes   CCM RN CM Interventions:  01/05/20 call completed with patient  Inter-disciplinary  care team collaboration (see longitudinal plan of care) . Evaluation of current treatment plan related to Diabetes and patient's adherence to plan as established by provider . Discussed and reviewed  with patient recent A1c is down to 8.2%; Re-educated on target A1c <7.0; Re-educated on dietary and exercise recommendations; Re-educated on daily glycemic target ranges, FBS 80-130, <180 after meals  . Determined patient has increased her activity with daily walking  . Positive Reinforcement given to patient for making efforts to lower her A1c and improve her overall health  . Advised patient, providing education and rationale, to check cbg daily before meals and record, calling the CCM team and or PCP for findings outside established parameters . Discussed plans with patient for ongoing care management follow up and provided patient with direct contact information for care management team . Mailed printed educational materials to patient related to "Diabetes Care Schedule"; "Preventing Complications from Diabetes"; "Grocery Shopping with Diabetes"  Patient Self Care Activities:  . Self administers medications as prescribed . Attends all scheduled provider appointments . Calls pharmacy for medication refills . Performs ADL's independently . Performs IADL's independently . Calls provider office for new concerns or questions  Please see past updates related to this goal by clicking on the "Past Updates" button in the selected goal        Patient verbalizes understanding of instructions provided today.   Telephone follow up appointment with care management team member scheduled for: 02/13/20  [name], RN, BSN, CCM Care Management Coordinator [place] [facility]  Direct Phone: [phone]

## 2020-01-07 NOTE — Chronic Care Management (AMB) (Signed)
Chronic Care Management   Follow Up Note   01/05/2020 Name: Joanna Sandoval MRN: [id number] DOB: [birth year]  Referred by: [name], [place] Reason for referral : Chronic Care Management (FU RN CM Call)   Joanna Sandoval is a 74 y.o. year old female who is a primary care patient of [name], [place]. The CCM team was consulted for assistance with chronic disease management and care coordination needs.    Review of patient status, including review of consultants reports, relevant laboratory and other test results, and collaboration with appropriate care team members and the patient's provider was performed as part of comprehensive patient evaluation and provision of chronic care management services.    SDOH (Social Determinants of Health) assessments performed: Yes - no acute needs identified at this time  See Care Plan activities for detailed interventions related to [place])   Placed outbound CCM RN CM follow up call to patient for a care plan update.     Outpatient Encounter Medications as of 01/05/2020  Medication Sig  . albuterol (PROVENTIL HFA;VENTOLIN HFA) 108 (90 Base) MCG/ACT inhaler Inhale 2 puffs into the lungs every 6 (six) hours as needed.  [name] atenolol (TENORMIN) 100 MG tablet Take 50 mg by mouth daily.   [name] atorvastatin (LIPITOR) 40 MG tablet Take 1 tablet (40 mg total) by mouth daily. Take 1/2 pill at bedtime  . Blood Glucose Monitoring Suppl (ACCU-CHEK GUIDE) w/Device KIT 1 Stick by Does not apply route 2 (two) times daily at 10 AM and 5 PM.  . carboxymethylcellulose (REFRESH PLUS) 0.5 % SOLN 1 drop 3 (three) times daily as needed.  . D3-50 50000 units capsule   . diclofenac sodium (VOLTAREN) 1 % GEL Apply 2 g topically 4 (four) times daily.  [name] glimepiride (AMARYL) 2 MG tablet Take 1.5 tablets (3 mg total) by mouth daily.  [name] glucose blood (ACCU-CHEK GUIDE) test strip Use as instructed  . [name] 100 MG tablet TAKE 1 TABLET(100 MG) BY MOUTH DAILY  . Lancets (ACCU-CHEK SOFT  TOUCH) lancets Use as instructed  . [name] KIT Use to test blood sugar twice daily as directed. E11.65  . latanoprost (XALATAN) 0.005 % ophthalmic solution 1 drop at bedtime.  [name] losartan (COZAAR) 50 MG tablet Take 50 mg by mouth daily.  . Magnesium 200 MG TABS Take 1 tab by mouth daily with evening meal  . naproxen (NAPROSYN) 500 MG tablet Take 1 tablet (500 mg total) by mouth 2 (two) times daily.  . RYBELSUS 3 MG TABS TAKE 1 TABLET BY MOUTH ONCE DAILY 30 MINUTES BEFORE A MEAL   No facility-administered encounter medications on file as of 01/05/2020.     Objective:  Lab Results  Component Value Date   HGBA1C 8.2 (H) 10/29/2019   HGBA1C 10.9 (H) 07/30/2019   HGBA1C 8.1 (H) 05/08/2019   Lab Results  Component Value Date   MICROALBUR 30 07/30/2019   LDLCALC 56 10/29/2019   CREATININE 0.90 10/29/2019   BP Readings from Last 3 Encounters:  10/29/19 124/72  10/17/19 (!) 150/71  07/30/19 138/78    Goals Addressed      Patient Stated   .  "I need to lower my Triglycerides" (pt-stated)        CARE PLAN ENTRY (see longitudinal plan of care for additional care plan information)  Current Barriers:  [name] Knowledge Deficits related to disease process and Self Health management of Hypertriglyceridemia . Chronic Disease Management support and education needs related to DM, Essential HTN  Nurse Case Manager Clinical Goal(s):  [name] Over the next 180 days, patient will work with the CCM team and PCP to address needs related to disease education and support for improved Self Health management of Hypertriglyceridemia   CCM RN CM Interventions:  01/05/20 call completed with patient  . Inter-disciplinary care team collaboration (see longitudinal plan of care) . Evaluation of current treatment plan related to Hypertriglyceridemia and patient's adherence to plan as established by provider. . Provided education to patient re: target Triglycerides <100; educated on  current level of 165; Educated on dietary recommendations . Discussed plans with patient for ongoing care management follow up and provided patient with direct contact information for care management team . Provided patient with printed educational materials related to "Triglycerides: Why Do They Matter?"; "13 Simple Ways to Lower Your Triglycerides"  Patient Self Care Activities:  . Self administers medications as prescribed . Attends all scheduled provider appointments . Calls pharmacy for medication refills . Calls provider office for new concerns or questions  Initial goal documentation     .  COMPLETED: "I was diagnosed with tendonitis" (pt-stated)        CARE PLAN ENTRY (see longitudinal plan of care for additional care plan information)  Current Barriers:  [name] Knowledge Deficits related to evaluation and treatment of Tendonitis of Rt arm . Chronic Disease Management support and education needs related to DM, Essential HTN  Nurse Case Manager Clinical Goal(s):  [name] Over the next 90 days, patient will work with the CCM team and PCP to address needs related to disease education and support for treatment management of Tendonitis.   CCM RN CM Interventions:  01/05/20 call completed with patient  . Inter-disciplinary care team collaboration (see longitudinal plan of care) . Evaluation of current treatment plan related to Tendonitis  and patient's adherence to plan as established by provider . Determined patient is currently not experiencing pain or discomfort secondary to Tendonitis previously reported . Discussed plans with patient for ongoing care management follow up and provided patient with direct contact information for care management team  Patient Self Care Activities:  . Self administers medications as prescribed . Attends all scheduled provider appointments . Calls pharmacy for medication refills . Performs ADL's independently . Performs IADL's independently . Calls  provider office for new concerns or questions  Please see past updates related to this goal by clicking on the "Past Updates" button in the selected goal      .  "I would like for my Diabetes to be better managed" (pt-stated)   On track     Current Barriers:  [name] Knowledge Deficits related to disease process and Self Health management of DM . Chronic Disease Management support and education needs related to DM, Essential HTN  Nurse Case Manager & PharmD Clinical Goal(s):  [name] New 01/05/20 Over the next 180 days, patient will continue to work with the CCM team and PCP on disease education and support for improved Self Health management of Diabetes   CCM RN CM Interventions:  01/05/20 call completed with patient  Inter-disciplinary care team collaboration (see longitudinal plan of care) . Evaluation of current treatment plan related to Diabetes and patient's adherence to plan as established by provider . Discussed and reviewed with patient recent A1c is down to 8.2%; Re-educated on target A1c <7.0; Re-educated on dietary and exercise recommendations; Re-educated on daily glycemic target ranges, FBS 80-130, <180 after meals  . Determined patient has increased her activity with daily walking  .  Positive Reinforcement given to patient for making efforts to lower her A1c and improve her overall health  . Advised patient, providing education and rationale, to check cbg daily before meals and record, calling the CCM team and or PCP for findings outside established parameters . Discussed plans with patient for ongoing care management follow up and provided patient with direct contact information for care management team . Mailed printed educational materials to patient related to "Diabetes Care Schedule"; "Preventing Complications from Diabetes"; "Grocery Shopping with Diabetes"  Patient Self Care Activities:  . Self administers medications as prescribed . Attends all scheduled provider  appointments . Calls pharmacy for medication refills . Performs ADL's independently . Performs IADL's independently . Calls provider office for new concerns or questions  Please see past updates related to this goal by clicking on the "Past Updates" button in the selected goal        Plan:   Telephone follow up appointment with care management team member scheduled for: 02/13/20  [name], RN, BSN, CCM Care Management Coordinator [facility]  Direct Phone: [phone]

## 2020-01-15 ENCOUNTER — Other Ambulatory Visit: Payer: Self-pay | Admitting: Nurse Practitioner

## 2020-01-15 DIAGNOSIS — E119 Type 2 diabetes mellitus without complications: Secondary | ICD-10-CM

## 2020-01-20 ENCOUNTER — Other Ambulatory Visit: Payer: Self-pay | Admitting: Nurse Practitioner

## 2020-01-20 DIAGNOSIS — E1165 Type 2 diabetes mellitus with hyperglycemia: Secondary | ICD-10-CM

## 2020-01-29 ENCOUNTER — Ambulatory Visit (INDEPENDENT_AMBULATORY_CARE_PROVIDER_SITE_OTHER): Payer: Medicare Other | Admitting: Nurse Practitioner

## 2020-01-29 ENCOUNTER — Other Ambulatory Visit: Payer: Self-pay

## 2020-01-29 ENCOUNTER — Encounter: Payer: Self-pay | Admitting: Nurse Practitioner

## 2020-01-29 VITALS — BP 140/64 | HR 82 | Temp 98.3°F | Ht 61.0 in | Wt 210.0 lb

## 2020-01-29 DIAGNOSIS — E1165 Type 2 diabetes mellitus with hyperglycemia: Secondary | ICD-10-CM

## 2020-01-29 DIAGNOSIS — E669 Obesity, unspecified: Secondary | ICD-10-CM | POA: Diagnosis not present

## 2020-01-29 DIAGNOSIS — Z79899 Other long term (current) drug therapy: Secondary | ICD-10-CM | POA: Diagnosis not present

## 2020-01-29 DIAGNOSIS — Z23 Encounter for immunization: Secondary | ICD-10-CM

## 2020-01-29 DIAGNOSIS — I10 Essential (primary) hypertension: Secondary | ICD-10-CM | POA: Diagnosis not present

## 2020-01-29 MED ORDER — RYBELSUS 7 MG PO TABS: 1.0000 | ORAL_TABLET | Freq: Every day | ORAL | 0 refills | Status: DC

## 2020-01-29 NOTE — Progress Notes (Signed)
This visit occurred during the SARS-CoV-2 public health emergency.  Safety protocols were in place, including screening questions prior to the visit, additional usage of staff PPE, and extensive cleaning of exam room while observing appropriate contact time as indicated for disinfecting solutions.  Subjective:     Patient ID: Joanna Sandoval , female    DOB: 1945/12/26 , 74 y.o.   MRN: [id number]   Chief Complaint  Patient presents with  . Diabetes  . Hypertension    HPI  Patient is here today for diabetes and hypertension check.   Wt Readings from Last 3 Encounters: 01/29/20 : 210 lb (95.3 kg) 10/29/19 : 213 lb (96.6 kg) 10/17/19 : 210 lb (95.3 kg)  She is planning to get her covid booster at the end of the month.     Diabetes She presents for her follow-up diabetic visit. She has type 2 diabetes mellitus. Her disease course has been worsening. Pertinent negatives for hypoglycemia include no dizziness or headaches. Pertinent negatives for diabetes include no chest pain, no fatigue, no polydipsia, no polyphagia and no polyuria. ([place] ) There are no diabetic complications. Risk factors for coronary artery disease include obesity, hypertension, diabetes mellitus, dyslipidemia, sedentary lifestyle and post-menopausal. Current diabetic treatment includes oral agent (dual therapy). She is compliant with treatment most of the time. Her weight is stable. She is following a generally unhealthy diet. When asked about meal planning, she reported none. She has not had a previous visit with a dietitian. She participates in exercise every other day. Her home blood glucose trend is decreasing steadily. (Blood sugar ranging 99-140.  ) She does not see a podiatrist.Eye exam is current (done 09/22/2019 neg retinopathy).  Hypertension This is a chronic problem. The current episode started more than 1 year ago. The problem is unchanged. The problem is uncontrolled (blood pressure slightly elevated -  systolic). Pertinent negatives include no chest pain, headaches or palpitations.     Past Medical History:  Diagnosis Date  . Arthritis   . Diabetes mellitus   . Hypercholesteremia   . Hypertension   . Post-menopausal bleeding      Family History  Problem Relation Age of Onset  . Hypertension Mother   . Hypertension Father   . Cancer Father        ?lung  . Diabetes Sister      Current Outpatient Medications:  .  albuterol (PROVENTIL HFA;VENTOLIN HFA) 108 (90 Base) MCG/ACT inhaler, Inhale 2 puffs into the lungs every 6 (six) hours as needed., Disp: , Rfl:  .  atenolol (TENORMIN) 100 MG tablet, Take 50 mg by mouth daily. , Disp: , Rfl:  .  atorvastatin (LIPITOR) 40 MG tablet, Take 1 tablet (40 mg total) by mouth daily. Take 1/2 pill at bedtime, Disp: 90 tablet, Rfl: 1 .  Blood Glucose Monitoring Suppl (ACCU-CHEK GUIDE) w/Device KIT, 1 Stick by Does not apply route 2 (two) times daily at 10 AM and 5 PM., Disp: 1 kit, Rfl: 1 .  carboxymethylcellulose (REFRESH PLUS) 0.5 % SOLN, 1 drop 3 (three) times daily as needed., Disp: , Rfl:  .  D3-50 50000 units capsule, , Disp: , Rfl: 0 .  diclofenac sodium (VOLTAREN) 1 % GEL, Apply 2 g topically 4 (four) times daily., Disp: 100 g, Rfl: 1 .  glimepiride (AMARYL) 2 MG tablet, TAKE 1 AND 1/2 TABLETS BY MOUTH DAILY, Disp: 135 tablet, Rfl: 1 .  glucose blood (ACCU-CHEK GUIDE) test strip, Use as instructed, Disp: 100 each, Rfl: 12 .  [name] lancets, Use as instructed, Disp: 100 each, Rfl: 12 .  [name] KIT, Use to test blood sugar twice daily as directed. E11.65, Disp: 1 kit, Rfl: 12 .  latanoprost (XALATAN) 0.005 % ophthalmic solution, 1 drop at bedtime., Disp: , Rfl:  .  losartan (COZAAR) 50 MG tablet, Take 50 mg by mouth daily., Disp: , Rfl:  .  Magnesium 200 MG TABS, Take 1 tab by mouth daily with evening meal, Disp: 30 tablet, Rfl: 2 .  naproxen (NAPROSYN) 500 MG tablet, Take 1 tablet  (500 mg total) by mouth 2 (two) times daily., Disp: 30 tablet, Rfl: 0 .  Semaglutide (RYBELSUS) 7 MG TABS, Take 1 tablet by mouth daily. Take 30 minutes before breakfast, Disp: 90 tablet, Rfl: 0   No Known Allergies   Review of Systems  Constitutional: Negative for fatigue.  Respiratory: Negative.   Cardiovascular: Negative.  Negative for chest pain, palpitations and leg swelling.  Endocrine: Negative for polydipsia, polyphagia and polyuria.  Neurological: Negative for dizziness and headaches.     Today's Vitals   01/29/20 0832  BP: 140/64  Pulse: 82  Temp: 98.3 F (36.8 C)  TempSrc: Oral  Weight: 210 lb (95.3 kg)  Height: $Remove'5\' 1"'Zrrzatk$  (1.549 m)  PainSc: 0-No pain   Body mass index is 39.68 kg/m.   Objective:  Physical Exam Vitals reviewed.  Constitutional:      General: She is not in acute distress.    Appearance: Normal appearance. She is obese.  HENT:     Left Ear: There is no impacted cerumen.     Nose: No congestion.  Eyes:     Extraocular Movements: Extraocular movements intact.     Conjunctiva/sclera: Conjunctivae normal.     Pupils: Pupils are equal, round, and reactive to light.  Cardiovascular:     Rate and Rhythm: Normal rate and regular rhythm.     Pulses: Normal pulses.     Heart sounds: Normal heart sounds. No murmur heard.   Pulmonary:     Effort: Pulmonary effort is normal. No respiratory distress.     Breath sounds: Normal breath sounds. No wheezing.  Musculoskeletal:     Right lower leg: No edema.     Left lower leg: No edema.  Skin:    General: Skin is warm and dry.     Capillary Refill: Capillary refill takes less than 2 seconds.  Neurological:     General: No focal deficit present.     Mental Status: She is alert and oriented to person, place, and time.     Cranial Nerves: No cranial nerve deficit.  Psychiatric:        Mood and Affect: Mood normal.        Behavior: Behavior normal.        Thought Content: Thought content normal.         Judgment: Judgment normal.         Assessment And Plan:     1. Essential hypertension . B/P is fairly controlled.  . CMP ordered to check renal function.  . The importance of regular exercise and dietary modification was stressed to the patient.   2. Uncontrolled type 2 diabetes mellitus with hyperglycemia ([place])  She is to stop taking [name]. Continue with Rybelsus to $RemoveBef'7mg'CjJzTzRgsw$  she has just received a new supply of 90 tabs of the 3 mg if she receives the $RemoveBefo'7mg'xHWjksOEUUO$  she is to start taking otherwise continue with the 3 mg  until finished  3. Obesity (BMI 30-39.9)  She has lost approximately 3 lbs since her last visit  Continue to eat a healthy diet low in sugar and exercise at least 150 minutes per week  4. Need for influenza vaccination  Influenza vaccine administered  Encouraged to take Tylenol as needed for fever or muscle aches. - Flu Vaccine QUAD High Dose(Fluad)    Patient was given opportunity to ask questions. Patient verbalized understanding of the plan and was able to repeat key elements of the plan. All questions were answered to their satisfaction.    Teola Bradley, FNP, have reviewed all documentation for this visit. The documentation on 01/29/20 for the exam, diagnosis, procedures, and orders are all accurate and complete.   THE PATIENT IS ENCOURAGED TO PRACTICE SOCIAL DISTANCING DUE TO THE COVID-19 PANDEMIC.

## 2020-01-29 NOTE — Patient Instructions (Addendum)
Diabetes Basics  Diabetes (diabetes mellitus) is a long-term (chronic) disease. It occurs when the body does not properly use sugar (glucose) that is released from food after you eat. Diabetes may be caused by one or both of these problems:  Your pancreas does not make enough of a hormone called insulin.  Your body does not react in a normal way to insulin that it makes. Insulin lets sugars (glucose) go into cells in your body. This gives you energy. If you have diabetes, sugars cannot get into cells. This causes high blood sugar (hyperglycemia). Follow these instructions at home: How is diabetes treated? You may need to take insulin or other diabetes medicines daily to keep your blood sugar in balance. Take your diabetes medicines every day as told by your doctor. List your diabetes medicines here: Diabetes medicines  Name of medicine: ______________________________ ? Amount (dose): _______________ Time (a.m./p.m.): _______________ Notes: ___________________________________  Name of medicine: ______________________________ ? Amount (dose): _______________ Time (a.m./p.m.): _______________ Notes: ___________________________________  Name of medicine: ______________________________ ? Amount (dose): _______________ Time (a.m./p.m.): _______________ Notes: ___________________________________ If you use insulin, you will learn how to give yourself insulin by injection. You may need to adjust the amount based on the food that you eat. List the types of insulin you use here: Insulin  Insulin type: ______________________________ ? Amount (dose): _______________ Time (a.m./p.m.): _______________ Notes: ___________________________________  Insulin type: ______________________________ ? Amount (dose): _______________ Time (a.m./p.m.): _______________ Notes: ___________________________________  Insulin type: ______________________________ ? Amount (dose): _______________ Time (a.m./p.m.):  _______________ Notes: ___________________________________  Insulin type: ______________________________ ? Amount (dose): _______________ Time (a.m./p.m.): _______________ Notes: ___________________________________  Insulin type: ______________________________ ? Amount (dose): _______________ Time (a.m./p.m.): _______________ Notes: ___________________________________ How do I manage my blood sugar?  Check your blood sugar levels using a blood glucose monitor as directed by your doctor. Your doctor will set treatment goals for you. Generally, you should have these blood sugar levels:  Before meals (preprandial): 80-130 mg/dL (4.4-7.2 mmol/L).  After meals (postprandial): below 180 mg/dL (10 mmol/L).  A1c level: less than 7%. Write down the times that you will check your blood sugar levels: Blood sugar checks  Time: _______________ Notes: ___________________________________  Time: _______________ Notes: ___________________________________  Time: _______________ Notes: ___________________________________  Time: _______________ Notes: ___________________________________  Time: _______________ Notes: ___________________________________  Time: _______________ Notes: ___________________________________  What do I need to know about low blood sugar? Low blood sugar is called hypoglycemia. This is when blood sugar is at or below 70 mg/dL (3.9 mmol/L). Symptoms may include:  Feeling: ? Hungry. ? Worried or nervous (anxious). ? Sweaty and clammy. ? Confused. ? Dizzy. ? Sleepy. ? Sick to your stomach (nauseous).  Having: ? A fast heartbeat. ? A headache. ? A change in your vision. ? Tingling or no feeling (numbness) around the mouth, lips, or tongue. ? Jerky movements that you cannot control (seizure).  Having trouble with: ? Moving (coordination). ? Sleeping. ? Passing out (fainting). ? Getting upset easily (irritability). Treating low blood sugar To treat low blood  sugar, eat or drink something sugary right away. If you can think clearly and swallow safely, follow the 15:15 rule:  Take 15 grams of a fast-acting carb (carbohydrate). Talk with your doctor about how much you should take.  Some fast-acting carbs are: ? Sugar tablets (glucose pills). Take 3-4 glucose pills. ? 6-8 pieces of hard candy. ? 4-6 oz (120-150 mL) of fruit juice. ? 4-6 oz (120-150 mL) of regular (not diet) soda. ? 1 Tbsp (15 mL) honey or sugar.    Check your blood sugar 15 minutes after you take the carb.  If your blood sugar is still at or below 70 mg/dL (3.9 mmol/L), take 15 grams of a carb again.  If your blood sugar does not go above 70 mg/dL (3.9 mmol/L) after 3 tries, get help right away.  After your blood sugar goes back to normal, eat a meal or a snack within 1 hour. Treating very low blood sugar If your blood sugar is at or below 54 mg/dL (3 mmol/L), you have very low blood sugar (severe hypoglycemia). This is an emergency. Do not wait to see if the symptoms will go away. Get medical help right away. Call your local emergency services (911 in the [place] Do not drive yourself to the hospital. Questions to ask your health care provider  Do I need to meet with a diabetes educator?  What equipment will I need to care for myself at home?  What diabetes medicines do I need? When should I take them?  How often do I need to check my blood sugar?  What number can I call if I have questions?  When is my next doctor's visit?  Where can I find a support group for people with diabetes? Where to find more information  American Diabetes Association: www.diabetes.org  American Association of Diabetes Educators: www.diabeteseducator.org/patient-resources Contact a doctor if:  Your blood sugar is at or above 240 mg/dL (13.3 mmol/L) for 2 days in a row.  You have been sick or have had a fever for 2 days or more, and you are not getting better.  You have any of these  problems for more than 6 hours: ? You cannot eat or drink. ? You feel sick to your stomach (nauseous). ? You throw up (vomit). ? You have watery poop (diarrhea). Get help right away if:  Your blood sugar is lower than 54 mg/dL (3 mmol/L).  You get confused.  You have trouble: ? Thinking clearly. ? Breathing. Summary  Diabetes (diabetes mellitus) is a long-term (chronic) disease. It occurs when the body does not properly use sugar (glucose) that is released from food after digestion.  Take insulin and diabetes medicines as told.  Check your blood sugar every day, as often as told.  Keep all follow-up visits as told by your doctor. This is important. This information is not intended to replace advice given to you by your health care provider. Make sure you discuss any questions you have with your health care provider. Document Revised: 12/25/2018 Document Reviewed: 07/06/2017 Elsevier Patient Education  2020 [employer].    Hypertension, Adult Hypertension is another name for high blood pressure. High blood pressure forces your heart to work harder to pump blood. This can cause problems over time. There are two numbers in a blood pressure reading. There is a top number (systolic) over a bottom number (diastolic). It is best to have a blood pressure that is below 120/80. Healthy choices can help lower your blood pressure, or you may need medicine to help lower it. What are the causes? The cause of this condition is not known. Some conditions may be related to high blood pressure. What increases the risk?  Smoking.  Having type 2 diabetes mellitus, high cholesterol, or both.  Not getting enough exercise or physical activity.  Being overweight.  Having too much fat, sugar, calories, or salt (sodium) in your diet.  Drinking too much alcohol.  Having long-term (chronic) kidney disease.  Having a family history of high  blood pressure.  Age. Risk increases with  age.  Race. You may be at higher risk if you are African American.  Gender. Men are at higher risk than women before age 26. After age 64, women are at higher risk than men.  Having obstructive sleep apnea.  Stress. What are the signs or symptoms?  High blood pressure may not cause symptoms. Very high blood pressure (hypertensive crisis) may cause: ? Headache. ? Feelings of worry or nervousness (anxiety). ? Shortness of breath. ? Nosebleed. ? A feeling of being sick to your stomach (nausea). ? Throwing up (vomiting). ? Changes in how you see. ? Very bad chest pain. ? Seizures. How is this treated?  This condition is treated by making healthy lifestyle changes, such as: ? Eating healthy foods. ? Exercising more. ? Drinking less alcohol.  Your health care provider may prescribe medicine if lifestyle changes are not enough to get your blood pressure under control, and if: ? Your top number is above 130. ? Your bottom number is above 80.  Your personal target blood pressure may vary. Follow these instructions at home: Eating and drinking   If told, follow the DASH eating plan. To follow this plan: ? Fill one half of your plate at each meal with fruits and vegetables. ? Fill one fourth of your plate at each meal with whole grains. Whole grains include whole-wheat pasta, brown rice, and whole-grain bread. ? Eat or drink low-fat dairy products, such as skim milk or low-fat yogurt. ? Fill one fourth of your plate at each meal with low-fat (lean) proteins. Low-fat proteins include fish, chicken without skin, eggs, beans, and tofu. ? Avoid fatty meat, cured and processed meat, or chicken with skin. ? Avoid pre-made or processed food.  Eat less than 1,500 mg of salt each day.  Do not drink alcohol if: ? Your doctor tells you not to drink. ? You are pregnant, may be pregnant, or are planning to become pregnant.  If you drink alcohol: ? Limit how much you use to:  0-1 drink a  day for women.  0-2 drinks a day for men. ? Be aware of how much alcohol is in your drink. In the [place] one drink equals one 12 oz bottle of beer (355 mL), one 5 oz glass of wine (148 mL), or one 1 oz glass of hard liquor (44 mL). Lifestyle   Work with your doctor to stay at a healthy weight or to lose weight. Ask your doctor what the best weight is for you.  Get at least 30 minutes of exercise most days of the week. This may include walking, swimming, or biking.  Get at least 30 minutes of exercise that strengthens your muscles (resistance exercise) at least 3 days a week. This may include lifting weights or doing Pilates.  Do not use any products that contain nicotine or tobacco, such as cigarettes, e-cigarettes, and chewing tobacco. If you need help quitting, ask your doctor.  Check your blood pressure at home as told by your doctor.  Keep all follow-up visits as told by your doctor. This is important. Medicines  Take over-the-counter and prescription medicines only as told by your doctor. Follow directions carefully.  Do not skip doses of blood pressure medicine. The medicine does not work as well if you skip doses. Skipping doses also puts you at risk for problems.  Ask your doctor about side effects or reactions to medicines that you should watch for. Contact a doctor if  you:  Think you are having a reaction to the medicine you are taking.  Have headaches that keep coming back (recurring).  Feel dizzy.  Have swelling in your ankles.  Have trouble with your vision. Get help right away if you:  Get a very bad headache.  Start to feel mixed up (confused).  Feel weak or numb.  Feel faint.  Have very bad pain in your: ? Chest. ? Belly (abdomen).  Throw up more than once.  Have trouble breathing. Summary  Hypertension is another name for high blood pressure.  High blood pressure forces your heart to work harder to pump blood.  For most people, a normal  blood pressure is less than 120/80.  Making healthy choices can help lower blood pressure. If your blood pressure does not get lower with healthy choices, you may need to take medicine. This information is not intended to replace advice given to you by your health care provider. Make sure you discuss any questions you have with your health care provider. Document Revised: 12/12/2017 Document Reviewed: 12/12/2017 Elsevier Patient Education  [place].  Influenza Virus Vaccine (Flucelvax) What is this medicine? INFLUENZA VIRUS VACCINE (in floo EN zuh VAHY ruhs vak SEEN) helps to reduce the risk of getting influenza also known as the flu. The vaccine only helps protect you against some strains of the flu. This medicine may be used for other purposes; ask your health care provider or pharmacist if you have questions. COMMON BRAND NAME(S): FLUCELVAX What should I tell my health care provider before I take this medicine? They need to know if you have any of these conditions: bleeding disorder like hemophilia fever or infection Guillain-Barre syndrome or other neurological problems immune system problems infection with the human immunodeficiency virus (HIV) or AIDS low blood platelet counts multiple sclerosis an unusual or allergic reaction to influenza virus vaccine, other medicines, foods, dyes or preservatives pregnant or trying to get pregnant breast-feeding How should I use this medicine? This vaccine is for injection into a muscle. It is given by a health care professional. A copy of Vaccine Information Statements will be given before each vaccination. Read this sheet carefully each time. The sheet may change frequently. Talk to your pediatrician regarding the use of this medicine in children. Special care may be needed. Overdosage: If you think you've taken too much of this medicine contact a poison control center or emergency room at once. Overdosage: If you think you have  taken too much of this medicine contact a poison control center or emergency room at once. NOTE: This medicine is only for you. Do not share this medicine with others. What if I miss a dose? This does not apply. What may interact with this medicine? chemotherapy or radiation therapy medicines that lower your immune system like etanercept, anakinra, infliximab, and adalimumab medicines that treat or prevent blood clots like warfarin phenytoin steroid medicines like prednisone or cortisone theophylline vaccines This list may not describe all possible interactions. Give your health care provider a list of all the medicines, herbs, non-prescription drugs, or dietary supplements you use. Also tell them if you smoke, drink alcohol, or use illegal drugs. Some items may interact with your medicine. What should I watch for while using this medicine? Report any side effects that do not go away within 3 days to your doctor or health care professional. Call your health care provider if any unusual symptoms occur within 6 weeks of receiving this vaccine. You may still catch  the flu, but the illness is not usually as bad. You cannot get the flu from the vaccine. The vaccine will not protect against colds or other illnesses that may cause fever. The vaccine is needed every year. What side effects may I notice from receiving this medicine? Side effects that you should report to your doctor or health care professional as soon as possible: allergic reactions like skin rash, itching or hives, swelling of the face, lips, or tongue Side effects that usually do not require medical attention (Report these to your doctor or health care professional if they continue or are bothersome.): fever headache muscle aches and pains pain, tenderness, redness, or swelling at the injection site tiredness This list may not describe all possible side effects. Call your doctor for medical advice about side effects. You may report  side effects to FDA at 1-800-FDA-1088. Where should I keep my medicine? The vaccine will be given by a health care professional in a clinic, pharmacy, doctor's office, or other health care setting. You will not be given vaccine doses to store at home. NOTE: This sheet is a summary. It may not cover all possible information. If you have questions about this medicine, talk to your doctor, pharmacist, or health care provider.  2020 Elsevier/Gold Standard (2011-03-15 14:06:47)  STOP TAKING [name] AND CONTINUE WITH RYBELSUS  If you receive the 7mg  you can start taking the 7 mg dose  otherwise continue with the 3 mg until finished

## 2020-01-30 LAB — LIPID PANEL
Chol/HDL Ratio: 2.8 ratio (ref 0.0–4.4)
Cholesterol, Total: 138 mg/dL (ref 100–199)
HDL: 49 mg/dL (ref >39)
LDL Chol Calc (NIH): 66 mg/dL (ref 0–99)
Triglycerides: 130 mg/dL (ref 0–149)
VLDL Cholesterol Cal: 23 mg/dL (ref 5–40)

## 2020-01-30 LAB — CMP14 + ANION GAP
ALT: 8 IU/L (ref 0–32)
AST: 14 IU/L (ref 0–40)
Albumin/Globulin Ratio: 1.5 (ref 1.2–2.2)
Albumin: 4.1 g/dL (ref 3.7–4.7)
Alkaline Phosphatase: 120 IU/L (ref 44–121)
Anion Gap: 13 mmol/L (ref 10.0–18.0)
BUN/Creatinine Ratio: 15 (ref 12–28)
BUN: 14 mg/dL (ref 8–27)
Bilirubin Total: 0.5 mg/dL (ref 0.0–1.2)
CO2: 27 mmol/L (ref 20–29)
Calcium: 9.5 mg/dL (ref 8.7–10.3)
Chloride: 103 mmol/L (ref 96–106)
Creatinine, Ser: 0.94 mg/dL (ref 0.57–1.00)
GFR calc Af Amer: 70 mL/min/{1.73_m2} (ref >59)
GFR calc non Af Amer: 60 mL/min/{1.73_m2} (ref >59)
Globulin, Total: 2.8 g/dL (ref 1.5–4.5)
Glucose: 199 mg/dL — ABNORMAL HIGH (ref 65–99)
Potassium: 4.4 mmol/L (ref 3.5–5.2)
Sodium: 143 mmol/L (ref 134–144)
Total Protein: 6.9 g/dL (ref 6.0–8.5)

## 2020-01-30 LAB — HEMOGLOBIN A1C
Est. average glucose Bld gHb Est-mCnc: 203 mg/dL
Hgb A1c MFr Bld: 8.7 % — ABNORMAL HIGH (ref 4.8–5.6)

## 2020-01-30 LAB — TSH: TSH: 0.721 u[IU]/mL (ref 0.450–4.500)

## 2020-02-03 ENCOUNTER — Other Ambulatory Visit: Payer: Self-pay | Admitting: Nurse Practitioner

## 2020-02-03 DIAGNOSIS — E119 Type 2 diabetes mellitus without complications: Secondary | ICD-10-CM

## 2020-02-03 MED ORDER — RYBELSUS 14 MG PO TABS: 1.0000 | ORAL_TABLET | Freq: Every day | ORAL | 1 refills | Status: DC

## 2020-02-03 MED ORDER — GLIMEPIRIDE 2 MG PO TABS: 2.0000 mg | ORAL_TABLET | Freq: Two times a day (BID) | ORAL | 1 refills | Status: DC

## 2020-02-03 NOTE — Progress Notes (Signed)
I have increased her amaryl to 1 tablet two times a day and will be increasing her Rybelsus to 14 mg when she gets her next refill

## 2020-02-13 ENCOUNTER — Telehealth: Payer: Medicare Other

## 2020-02-26 ENCOUNTER — Telehealth: Payer: Medicare Other

## 2020-02-26 ENCOUNTER — Telehealth: Payer: Self-pay

## 2020-02-26 NOTE — Telephone Encounter (Cosign Needed)
  Chronic Care Management   Outreach Note  02/26/2020 Name: Joanna Sandoval MRN: [id number] DOB: 1946/01/26  Referred by: [name], FNP Reason for referral : No chief complaint on file.   A second unsuccessful telephone outreach was attempted today. The patient was referred to the case management team for assistance with care management and care coordination.   Follow Up Plan: A HIPAA compliant phone message was left for the patient providing contact information and requesting a return call.  Telephone follow up appointment with care management team member scheduled for: 03/30/20  [name], RN, BSN, CCM Care Management Coordinator [facility]  Direct Phone: [phone]

## 2020-03-15 ENCOUNTER — Other Ambulatory Visit: Payer: Self-pay | Admitting: Nurse Practitioner

## 2020-03-15 DIAGNOSIS — I999 Unspecified disorder of circulatory system: Secondary | ICD-10-CM

## 2020-03-24 ENCOUNTER — Other Ambulatory Visit: Payer: Self-pay

## 2020-03-24 ENCOUNTER — Ambulatory Visit: Payer: Self-pay

## 2020-03-24 ENCOUNTER — Telehealth: Payer: Medicare Other

## 2020-03-24 DIAGNOSIS — I999 Unspecified disorder of circulatory system: Secondary | ICD-10-CM

## 2020-03-24 DIAGNOSIS — I10 Essential (primary) hypertension: Secondary | ICD-10-CM

## 2020-03-24 DIAGNOSIS — E119 Type 2 diabetes mellitus without complications: Secondary | ICD-10-CM

## 2020-03-30 ENCOUNTER — Telehealth: Payer: Medicare Other

## 2020-03-30 NOTE — Chronic Care Management (AMB) (Signed)
Chronic Care Management   Follow Up Note   03/24/2020 Name: Joanna Sandoval MRN: [id number] DOB: 06/08/1945  Referred by: [name], FNP Reason for referral : Chronic Care Management (Inbound Call from patient )   Joanna Sandoval is a 74 y.o. year old female who is a primary care patient of [name], [place]. The CCM team was consulted for assistance with chronic disease management and care coordination needs.    Review of patient status, including review of consultants reports, relevant laboratory and other test results, and collaboration with appropriate care team members and the patient's provider was performed as part of comprehensive patient evaluation and provision of chronic care management services.    SDOH (Social Determinants of Health) assessments performed: Yes See Care Plan activities for detailed interventions related to [place])   Completed CCM RN CM follow up call with patient for a care plan update.    Outpatient Encounter Medications as of 03/24/2020  Medication Sig  . albuterol (PROVENTIL HFA;VENTOLIN HFA) 108 (90 Base) MCG/ACT inhaler Inhale 2 puffs into the lungs every 6 (six) hours as needed.  [name] atenolol (TENORMIN) 100 MG tablet Take 50 mg by mouth daily.   [name] atorvastatin (LIPITOR) 40 MG tablet Take 1 tablet (40 mg total) by mouth daily. Take 1/2 pill at bedtime  . Blood Glucose Monitoring Suppl (ACCU-CHEK GUIDE) w/Device KIT 1 Stick by Does not apply route 2 (two) times daily at 10 AM and 5 PM.  . carboxymethylcellulose (REFRESH PLUS) 0.5 % SOLN 1 drop 3 (three) times daily as needed.  . D3-50 50000 units capsule   . diclofenac sodium (VOLTAREN) 1 % GEL Apply 2 g topically 4 (four) times daily.  [name] glimepiride (AMARYL) 2 MG tablet Take 1 tablet (2 mg total) by mouth in the morning and at bedtime.  [name] glucose blood (ACCU-CHEK GUIDE) test strip Use as instructed  . [name] lancets Use as instructed  . Lancets Misc. (ACCU-CHEK FASTCLIX  LANCET) KIT Use to test blood sugar twice daily as directed. E11.65  . latanoprost (XALATAN) 0.005 % ophthalmic solution 1 drop at bedtime.  [name] losartan (COZAAR) 50 MG tablet Take 50 mg by mouth daily.  . Magnesium 200 MG TABS Take 1 tab by mouth daily with evening meal  . naproxen (NAPROSYN) 500 MG tablet Take 1 tablet (500 mg total) by mouth 2 (two) times daily.  . Semaglutide (RYBELSUS) 14 MG TABS Take 1 tablet by mouth daily. Take 30 minutes before breakfast   No facility-administered encounter medications on file as of 03/24/2020.     Objective:  Lab Results  Component Value Date   HGBA1C 8.7 (H) 01/29/2020   HGBA1C 8.2 (H) 10/29/2019   HGBA1C 10.9 (H) 07/30/2019   Lab Results  Component Value Date   MICROALBUR 30 07/30/2019   LDLCALC 66 01/29/2020   CREATININE 0.94 01/29/2020   BP Readings from Last 3 Encounters:  01/29/20 140/64  10/29/19 124/72  10/17/19 (!) 150/71    Goals Addressed     Patient Care Plan: Diabetes Type 2 (Adult)    Problem Identified: Glycemic Management (Diabetes, Type 2)   Priority: High    Long-Range Goal: Glycemic Management Optimized   Start Date: 03/24/2020  This Visit's Progress: On track  Priority: High  Note:   Objective:  Lab Results  Component Value Date   HGBA1C 8.7 (H) 01/29/2020 .   Lab Results  Component Value Date   CREATININE 0.94 01/29/2020   CREATININE 0.90 10/29/2019  CREATININE 1.15 (H) 07/30/2019   . No results found for: EGFR Current Barriers:  [name] Knowledge Deficits related to basic Diabetes pathophysiology and self care/management . Knowledge Deficits related to medications used for management of diabetes Case Manager Clinical Goal(s):  [name] Over the next 90 days, patient will demonstrate improved adherence to prescribed treatment plan for diabetes self care/management as evidenced by:  . daily monitoring and recording of CBG  . adherence to ADA/ carb modified diet . exercise 3-5 days/week . adherence to  prescribed medication regimen Interventions:  . Reviewed medications with patient and discussed importance of medication adherence . Discussed plans with patient for ongoing care management follow up and provided patient with direct contact information for care management team . Advised patient, providing education and rationale, to check cbg before meals and at bedtime and record, calling the CCM team and or PCP for findings outside established parameters.   . Review of patient status, including review of consultants reports, relevant laboratory and other test results, and medications completed.  Anticipate A1C testing (point-of-care) every 3 to 6 months based on goal attainment.   Review mutually-set A1C goal or target range.   Compare self-reported symptoms of hypo or hyperglycemia to blood glucose levels, diet and fluid intake, current medications, psychosocial and physiologic stressors, change in activity and barriers to care adherence. Patient Goals/Self-Care Activities . Over the next 90 days, patient will:  - Self administers oral medications as prescribed Attends all scheduled provider appointments Checks blood sugars as prescribed and utilize hyper and hypoglycemia protocol as needed Adheres to prescribed ADA/carb modified - set target A1C Follow Up Plan: Telephone follow up appointment with care management team member scheduled for: 05/06/20   Problem Identified: Disease Progression (Diabetes, Type 2)   Priority: High    Long-Range Goal: Disease Progression Prevented or Minimized   Start Date: 03/24/2020  Expected End Date: 06/22/2020  This Visit's Progress: On track  Priority: High  Note:   Objective:  Lab Results  Component Value Date   HGBA1C 8.7 (H) 01/29/2020 .   Lab Results  Component Value Date   CREATININE 0.94 01/29/2020   CREATININE 0.90 10/29/2019   CREATININE 1.15 (H) 07/30/2019 .   [name] No results found for: EGFR Current Barriers:  [name] Knowledge Deficits  related to basic Diabetes pathophysiology and self care/management Case Manager Clinical Goal(s):  [name] Over the next 90 days, patient will demonstrate improved adherence to prescribed treatment plan for diabetes self care/management as evidenced by:  . daily monitoring and recording of CBG  . adherence to ADA/ carb modified diet . exercise 3-5 days/week . adherence to prescribed medication regimen Interventions:  . Provided education to patient about basic DM disease process . Reviewed medications with patient and discussed importance of medication adherence . Discussed plans with patient for ongoing care management follow up and provided patient with direct contact information for care management team . Review of patient status, including review of consultants reports, relevant laboratory and other test results, and medications completed.  Prepare patient for laboratory and diagnostic exams based on risk and presentation.   Encourage lifestyle changes, such as increased intake of plant-based foods, stress reduction, consistent physical activity and smoking cessation to prevent long-term complications and chronic disease.    Individualize activity and exercise recommendations while considering potential limitations, such as neuropathy, retinopathy or the ability to prevent hyperglycemia or hypoglycemia.   Patient Goals/Self-Care Activities . Over the next 90 days, patient will:  - Self administers oral medications  as prescribed Attends all scheduled provider appointments Checks blood sugars as prescribed and utilize hyper and hypoglycemia protocol as needed Adheres to prescribed ADA/carb modified - check blood sugar at prescribed times - check blood sugar before and after exercise - check blood sugar if I feel it is too high or too low - enter blood sugar readings and medication or insulin into daily log - take the blood sugar log to all doctor visits - take the blood sugar meter to all  doctor visits Follow Up Plan: Telephone follow up appointment with care management team member scheduled for: 05/06/20   Patient Care Plan: Coronary Artery Disease (Adult)    Problem Identified: Disease Progression (Coronary Artery Disease)   Priority: High  Onset Date: 03/24/2020    Long-Range Goal: Disease Progression Prevented or Minimized   Start Date: 03/24/2020  Expected End Date: 06/22/2020  This Visit's Progress: On track  Priority: High  Note:   Current Barriers:   Ineffective Self Health Maintenance  Currently UNABLE TO independently self manage needs related to chronic health conditions.   Knowledge Deficits related to short term plan for care coordination needs and long term plans for chronic disease management needs Nurse Case Manager Clinical Goal(s):   Over the next 90 days, patient will work with care management team to address care coordination and chronic disease management needs related to Disease Management  Educational Needs  Care Coordination  Medication Management and Education  Psychosocial Support   Interventions:   Correlate presence and severity of symptoms to response, adherence to therapy and changes in new or existing comorbidity.   Establish a mutually-agreed-upon early intervention process of communication with primary care provider for worsening signs/symptoms.   Promote a healthy diet that includes primarily plant-based foods, such as fruits, vegetables, whole grains, beans and legumes, low-fat dairy and lean meats.  Address risk factors for disease progression; promote lifestyle changes based on risk.   Help patients to develop an awareness of worsening cardiac ischemia, including atypical symptoms that may occur in women, diabetics and the elderly.  Patient Goals/Self Care Activities: Over the next 90 days, patient will:  Self administers oral medications as prescribed Attends all scheduled provider appointments Adheres to prescribed low  fat, heart healthy diet  -Review patient educational article "Triglycerides, Why Do They Matter"? (mailed to patient) -Follow up with Vascular Surgeon to further evaluate abnormal peripheral artery study   Follow Up Plan: Telephone follow up appointment with care management team member scheduled for: 05/06/20    Plan:   Telephone follow up appointment with care management team member scheduled for: 05/06/20  [name], RN, BSN, CCM Care Management Coordinator [place] [facility]  Direct Phone: [phone]

## 2020-03-30 NOTE — Patient Instructions (Signed)
Visit Information  Goals Addressed      Other   .  Manage Cholesterol/Triglycerides        Timeframe:  Long-Range Goal Priority:  High Start Date:  03/24/20                           Expected End Date:  06/22/20  Over the next 90 days, patient will:  -Self administers oral medications as prescribed -Attends all scheduled provider appointments -Adheres to prescribed low fat, heart healthy diet  -Review patient educational article "Triglycerides, Why Do They Matter"? (mailed to patient) -Follow up with Vascular Surgeon to further evaluate abnormal peripheral artery study                    .  Monitor and Manage My Blood Sugar-Diabetes Type 2        Timeframe:  Long-Range Goal Priority:  High Start Date: 03/24/20                        Expected End Date: 06/22/20                    Follow Up Date 05/06/20    - check blood sugar at prescribed times - check blood sugar before and after exercise - check blood sugar if I feel it is too high or too low - enter blood sugar readings and medication or insulin into daily log - take the blood sugar log to all doctor visits - take the blood sugar meter to all doctor visits    Why is this important?    Checking your blood sugar at home helps to keep it from getting very high or very low.   Writing the results in a diary or log helps the doctor know how to care for you.   Your blood sugar log should have the time, date and the results.   Also, write down the amount of insulin or other medicine that you take.   Other information, like what you ate, exercise done and how you were feeling, will also be helpful.     Notes:     .  Set My Target A1C-Diabetes Type 2        Timeframe:  Long-Range Goal Priority:  High Start Date: 03/24/20                            Expected End Date: 06/22/20                      Follow Up Date  05/06/20   - set target A1C    Why is this important?    Your target A1C is decided together by you and your  doctor.   It is based on several things like your age and other health issues.    Notes:        The patient verbalized understanding of instructions, educational materials, and care plan provided today and declined offer to receive copy of patient instructions, educational materials, and care plan.   Telephone follow up appointment with care management team member scheduled for: 05/06/20  [name], RN

## 2020-04-06 ENCOUNTER — Other Ambulatory Visit: Payer: Self-pay | Admitting: Nurse Practitioner

## 2020-04-21 DIAGNOSIS — E119 Type 2 diabetes mellitus without complications: Secondary | ICD-10-CM | POA: Diagnosis not present

## 2020-04-21 DIAGNOSIS — E559 Vitamin D deficiency, unspecified: Secondary | ICD-10-CM | POA: Diagnosis not present

## 2020-04-21 DIAGNOSIS — I10 Essential (primary) hypertension: Secondary | ICD-10-CM | POA: Diagnosis not present

## 2020-04-21 DIAGNOSIS — E785 Hyperlipidemia, unspecified: Secondary | ICD-10-CM | POA: Diagnosis not present

## 2020-05-04 ENCOUNTER — Ambulatory Visit: Payer: Medicare Other | Admitting: Nurse Practitioner

## 2020-05-05 ENCOUNTER — Other Ambulatory Visit: Payer: Self-pay

## 2020-05-05 DIAGNOSIS — R635 Abnormal weight gain: Secondary | ICD-10-CM | POA: Insufficient documentation

## 2020-05-05 DIAGNOSIS — I739 Peripheral vascular disease, unspecified: Secondary | ICD-10-CM

## 2020-05-05 DIAGNOSIS — Z1211 Encounter for screening for malignant neoplasm of colon: Secondary | ICD-10-CM | POA: Insufficient documentation

## 2020-05-05 DIAGNOSIS — R195 Other fecal abnormalities: Secondary | ICD-10-CM | POA: Insufficient documentation

## 2020-05-06 ENCOUNTER — Telehealth: Payer: Medicare Other

## 2020-05-20 ENCOUNTER — Ambulatory Visit: Payer: Medicare Other | Admitting: Nurse Practitioner

## 2020-05-29 ENCOUNTER — Other Ambulatory Visit: Payer: Self-pay | Admitting: Nurse Practitioner

## 2020-05-29 DIAGNOSIS — E119 Type 2 diabetes mellitus without complications: Secondary | ICD-10-CM

## 2020-05-31 ENCOUNTER — Other Ambulatory Visit: Payer: Self-pay

## 2020-05-31 ENCOUNTER — Ambulatory Visit: Payer: Medicare Other | Admitting: Surgery

## 2020-05-31 ENCOUNTER — Ambulatory Visit (HOSPITAL_COMMUNITY)
Admission: RE | Admit: 2020-05-31 | Discharge: 2020-05-31 | Disposition: A | Discharge status: Home / Self Care | Payer: Medicare Other | Source: Ambulatory Visit | Attending: Surgery | Admitting: Surgery

## 2020-05-31 ENCOUNTER — Encounter: Payer: Self-pay | Admitting: Surgery

## 2020-05-31 VITALS — BP 138/84 | HR 96 | Temp 97.7°F | Resp 20 | Ht 61.0 in | Wt 206.0 lb

## 2020-05-31 DIAGNOSIS — I739 Peripheral vascular disease, unspecified: Secondary | ICD-10-CM | POA: Diagnosis not present

## 2020-05-31 DIAGNOSIS — I70213 Atherosclerosis of native arteries of extremities with intermittent claudication, bilateral legs: Secondary | ICD-10-CM

## 2020-05-31 DIAGNOSIS — M7989 Other specified soft tissue disorders: Secondary | ICD-10-CM

## 2020-05-31 NOTE — Progress Notes (Signed)
[facility]  Patient name: Joanna Sandoval MRN: [id number] DOB: April 09, 1946 Sex: female   REQUESTING PROVIDER:    [name]   REASON FOR CONSULT:    PAD  HISTORY OF PRESENT ILLNESS:   Joanna Sandoval is a 75 y.o. female, who is referred for further follow-up of a abnormal PAD screen from her insurance company.  The patient denies any history of claudication.  She does not have any nonhealing wounds.  She denies rest pain.  Patient is medically managed for diabetes.  She is a former smoker.  She is on a statin for hypercholesterolemia.  She is medically managed for hypertension with an ARB.  PAST MEDICAL HISTORY    Past Medical History:  Diagnosis Date  . Arthritis   . Diabetes mellitus   . Hypercholesteremia   . Hypertension   . Post-menopausal bleeding      FAMILY HISTORY   Family History  Problem Relation Age of Onset  . Hypertension Mother   . Hypertension Father   . Cancer Father        ?lung  . Diabetes Sister     SOCIAL HISTORY:   Social History   Socioeconomic History  . Marital status: Divorced    Spouse name: Not on file  . Number of children: Not on file  . Years of education: Not on file  . Highest education level: Not on file  Occupational History  . Not on file  Tobacco Use  . Smoking status: Former Smoker    Types: Cigarettes  . Smokeless tobacco: Never Used  Vaping Use  . Vaping Use: Never used  Substance and Sexual Activity  . Alcohol use: No  . Drug use: No  . Sexual activity: Not Currently    Birth control/protection: Post-menopausal  Other Topics Concern  . Not on file  Social History Narrative  . Not on file   Social Determinants of Health   Financial Resource Strain: Low Risk   . Difficulty of Paying Living Expenses: Not hard at all  Food Insecurity: No Food Insecurity  . Worried About Charity fundraiser in the Last Year: Never true  . Ran Out of Food  in the Last Year: Never true  Transportation Needs: No Transportation Needs  . Lack of Transportation (Medical): No  . Lack of Transportation (Non-Medical): No  Physical Activity: Sufficiently Active  . Days of Exercise per Week: 7 days  . Minutes of Exercise per Session: 60 min  Stress: No Stress Concern Present  . Feeling of Stress : Not at all  Social Connections: Not on file  Intimate Partner Violence: Not on file    ALLERGIES:    No Known Allergies  CURRENT MEDICATIONS:    Current Outpatient Medications  Medication Sig Dispense Refill  . albuterol (PROVENTIL HFA;VENTOLIN HFA) 108 (90 Base) MCG/ACT inhaler Inhale 2 puffs into the lungs every 6 (six) hours as needed.    [name] atenolol (TENORMIN) 100 MG tablet Take 50 mg by mouth daily.     [name] atorvastatin (LIPITOR) 40 MG tablet Take 1 tablet (40 mg total) by mouth daily. Take 1/2 pill at bedtime 90 tablet 1  . Blood Glucose Monitoring Suppl (ACCU-CHEK GUIDE) w/Device KIT USE TWICE DAILY AT 10AM AND 5PM 1 kit 1  . carboxymethylcellulose (REFRESH PLUS) 0.5 % SOLN 1 drop 3 (three) times daily as needed.    [name] glimepiride (AMARYL) 2 MG tablet Take 1 tablet (2 mg total) by mouth in the  morning and at bedtime. 180 tablet 1  . glucose blood (ACCU-CHEK GUIDE) test strip Use as instructed 100 each 12  . [name] lancets Use as instructed 100 each 12  . [name] KIT Use to test blood sugar twice daily as directed. E11.65 1 kit 12  . latanoprost (XALATAN) 0.005 % ophthalmic solution 1 drop at bedtime.    . losartan (COZAAR) 50 MG tablet Take 50 mg by mouth daily.    . Magnesium 200 MG TABS Take 1 tab by mouth daily with evening meal 30 tablet 2  . naproxen (NAPROSYN) 500 MG tablet Take 1 tablet (500 mg total) by mouth 2 (two) times daily. 30 tablet 0  . RYBELSUS 14 MG TABS TAKE 1 TABLET BY MOUTH  DAILY 1/2 HOUR BEFORE  BREAKFAST 90 tablet 3  . D3-50 50000 units capsule  (Patient not  taking: Reported on 05/31/2020)  0  . diclofenac sodium (VOLTAREN) 1 % GEL Apply 2 g topically 4 (four) times daily. (Patient not taking: Reported on 05/31/2020) 100 g 1   No current facility-administered medications for this visit.    REVIEW OF SYSTEMS:   [X] denotes positive finding, [ ] denotes negative finding Cardiac  Comments:  Chest pain or chest pressure:    Shortness of breath upon exertion:    Short of breath when lying flat:    Irregular heart rhythm:        Vascular    Pain in calf, thigh, or hip brought on by ambulation:    Pain in feet at night that wakes you up from your sleep:     Blood clot in your veins:    Leg swelling:         Pulmonary    Oxygen at home:    Productive cough:     Wheezing:         Neurologic    Sudden weakness in arms or legs:     Sudden numbness in arms or legs:     Sudden onset of difficulty speaking or slurred speech:    Temporary loss of vision in one eye:     Problems with dizziness:         Gastrointestinal    Blood in stool:      Vomited blood:         Genitourinary    Burning when urinating:     Blood in urine:        Psychiatric    Major depression:         Hematologic    Bleeding problems:    Problems with blood clotting too easily:        Skin    Rashes or ulcers:        Constitutional    Fever or chills:     PHYSICAL EXAM:   Vitals:   05/31/20 1429  BP: 138/84  Pulse: 96  Resp: 20  Temp: 97.7 F (36.5 C)  SpO2: 95%  Weight: 206 lb (93.4 kg)  Height: 5' 1" (1.549 m)    GENERAL: The patient is a well-nourished female, in no acute distress. The vital signs are documented above. CARDIAC: There is a regular rate and rhythm.  VASCULAR: Palpable pedal pulses.  Trace edema bilaterally PULMONARY: Nonlabored respirations ABDOMEN: Soft and non-tender with normal pitched bowel sounds.  MUSCULOSKELETAL: There are no major deformities or cyanosis. NEUROLOGIC: No focal weakness or paresthesias are  detected. SKIN: There are no ulcers or   rashes noted. PSYCHIATRIC: The patient has a normal affect.  STUDIES:   I have reviewed the following: ABI/TBIToday's ABIToday's TBIPrevious ABIPrevious TBI  +-------+-----------+-----------+------------+------------+  Right 1.18    0.85                  +-------+-----------+-----------+------------+------------+  Left  0.93    0.89                  +-------+-----------+-----------+------------+------------+  Right toe:  135 Left toe: 141  ASSESSMENT and PLAN   PAD: I discussed with the patient that she has palpable pedal pulses and essentially a normal arterial duplex.  She does not have significant peripheral vascular disease.  No intervention is warranted at this time.  She is on optimal medical therapy currently.  Lower extremity edema.:  The patient was given 15-20 compression stockings to see if this helps with some of her swelling.  She will follow-up on an as-needed basis  Wells Brabham, IV, MD, FACS [facility] [place] Tel [phone] Pager [phone] 

## 2020-06-01 ENCOUNTER — Telehealth: Payer: Self-pay

## 2020-06-01 ENCOUNTER — Telehealth: Payer: Medicare Other

## 2020-06-01 NOTE — Telephone Encounter (Cosign Needed)
  Chronic Care Management   Outreach Note  06/01/2020 Name: Joanna Sandoval MRN: [id number] DOB: 1945-04-19  Referred by: [name], FNP Reason for referral : Chronic Care Management (RN CM FU Call )   An unsuccessful telephone outreach was attempted today. The patient was referred to the case management team for assistance with care management and care coordination.   Follow Up Plan: A HIPAA compliant phone message was left for the patient providing contact information and requesting a return call. Telephone follow up appointment with care management team member scheduled for: 06/30/20  [name], RN, BSN, CCM Care Management Coordinator [place] [facility]  Direct Phone: [phone]

## 2020-06-03 ENCOUNTER — Ambulatory Visit: Payer: Medicare Other | Admitting: Nurse Practitioner

## 2020-06-30 ENCOUNTER — Telehealth: Payer: Medicare Other

## 2020-07-06 ENCOUNTER — Other Ambulatory Visit: Payer: Self-pay | Admitting: Internal Medicine

## 2020-07-07 ENCOUNTER — Ambulatory Visit (INDEPENDENT_AMBULATORY_CARE_PROVIDER_SITE_OTHER): Payer: Medicare Other | Admitting: Nurse Practitioner

## 2020-07-07 ENCOUNTER — Encounter: Payer: Self-pay | Admitting: Nurse Practitioner

## 2020-07-07 ENCOUNTER — Other Ambulatory Visit: Payer: Self-pay

## 2020-07-07 ENCOUNTER — Other Ambulatory Visit: Payer: Self-pay | Admitting: Nurse Practitioner

## 2020-07-07 VITALS — BP 138/90 | HR 95 | Temp 98.3°F | Ht 60.8 in | Wt 206.6 lb

## 2020-07-07 DIAGNOSIS — Z6839 Body mass index (BMI) 39.0-39.9, adult: Secondary | ICD-10-CM

## 2020-07-07 DIAGNOSIS — N644 Mastodynia: Secondary | ICD-10-CM

## 2020-07-07 DIAGNOSIS — D229 Melanocytic nevi, unspecified: Secondary | ICD-10-CM

## 2020-07-07 NOTE — Progress Notes (Signed)
I,Tianna Badgett,acting as a Education administrator for [employer], NP.,have documented all relevant documentation on the behalf of [employer], NP,as directed by  Bary Castilla, NP while in the presence of Bary Castilla, NP.  This visit occurred during the SARS-CoV-2 public health emergency.  Safety protocols were in place, including screening questions prior to the visit, additional usage of staff PPE, and extensive cleaning of exam room while observing appropriate contact time as indicated for disinfecting solutions.  Subjective:     Patient ID: Joanna Sandoval , female    DOB: 1945/08/30 , 75 y.o.   MRN: [id number]   Chief Complaint  Patient presents with  . Breast Pain    HPI  Patient is here with complaints of breast pain. The pain started a couple of days ago. She missed the mammogram last year. She felt a small area on her left breast so she was concerned and therefore came to get it looked at more closely.     Past Medical History:  Diagnosis Date  . Arthritis   . Diabetes mellitus   . Hypercholesteremia   . Hypertension   . Post-menopausal bleeding      Family History  Problem Relation Age of Onset  . Hypertension Mother   . Hypertension Father   . Cancer Father        ?lung  . Diabetes Sister      Current Outpatient Medications:  .  albuterol (PROVENTIL HFA;VENTOLIN HFA) 108 (90 Base) MCG/ACT inhaler, Inhale 2 puffs into the lungs every 6 (six) hours as needed., Disp: , Rfl:  .  atenolol (TENORMIN) 100 MG tablet, Take 50 mg by mouth daily. , Disp: , Rfl:  .  atorvastatin (LIPITOR) 40 MG tablet, Take 1 tablet (40 mg total) by mouth daily. Take 1/2 pill at bedtime, Disp: 90 tablet, Rfl: 1 .  Blood Glucose Monitoring Suppl (ACCU-CHEK GUIDE) w/Device KIT, USE TWICE DAILY AT 10AM AND 5PM, Disp: 1 kit, Rfl: 1 .  carboxymethylcellulose (REFRESH PLUS) 0.5 % SOLN, 1 drop 3 (three) times daily as needed., Disp: , Rfl:  .  D3-50 50000 units capsule, , Disp: , Rfl:  0 .  diclofenac sodium (VOLTAREN) 1 % GEL, Apply 2 g topically 4 (four) times daily. (Patient not taking: Reported on 05/31/2020), Disp: 100 g, Rfl: 1 .  glimepiride (AMARYL) 2 MG tablet, Take 1 tablet (2 mg total) by mouth in the morning and at bedtime., Disp: 180 tablet, Rfl: 1 .  glucose blood (ACCU-CHEK GUIDE) test strip, Use as instructed, Disp: 100 each, Rfl: 12 .  [name] lancets, Use as instructed, Disp: 100 each, Rfl: 12 .  [name] KIT, Use to test blood sugar twice daily as directed. E11.65, Disp: 1 kit, Rfl: 12 .  latanoprost (XALATAN) 0.005 % ophthalmic solution, 1 drop at bedtime., Disp: , Rfl:  .  losartan (COZAAR) 50 MG tablet, Take 50 mg by mouth daily., Disp: , Rfl:  .  Magnesium 200 MG TABS, Take 1 tab by mouth daily with evening meal, Disp: 30 tablet, Rfl: 2 .  naproxen (NAPROSYN) 500 MG tablet, Take 1 tablet (500 mg total) by mouth 2 (two) times daily., Disp: 30 tablet, Rfl: 0 .  RYBELSUS 14 MG TABS, TAKE 1 TABLET BY MOUTH  DAILY 1/2 HOUR BEFORE  BREAKFAST, Disp: 90 tablet, Rfl: 3   No Known Allergies   Review of Systems  Constitutional: Negative.  Negative for chills, fatigue and fever.  Respiratory: Negative.  Cardiovascular: Negative.  Negative for chest pain and palpitations.  Gastrointestinal: Negative.   Musculoskeletal: Negative for back pain, myalgias and neck pain.  Neurological: Negative.      Today's Vitals   07/07/20 0937  BP: 138/90  Pulse: 95  Temp: 98.3 F (36.8 C)  TempSrc: Oral  Weight: 206 lb 9.6 oz (93.7 kg)  Height: 5' 0.8" (1.544 m)   Body mass index is 39.29 kg/m.  Wt Readings from Last 3 Encounters:  07/07/20 206 lb 9.6 oz (93.7 kg)  05/31/20 206 lb (93.4 kg)  01/29/20 210 lb (95.3 kg)    Objective:  Physical Exam Constitutional:      Appearance: Normal appearance. She is obese.  HENT:     Head: Normocephalic and atraumatic.  Cardiovascular:     Rate and Rhythm: Normal  rate and regular rhythm.     Pulses: Normal pulses.     Heart sounds: Normal heart sounds. No murmur heard.   Pulmonary:     Effort: Pulmonary effort is normal. No respiratory distress.     Breath sounds: Normal breath sounds. No wheezing.  Chest:  Breasts:     Tanner Score is 5.     Right: Normal.     Left: Mass and tenderness present.     Skin:    General: Skin is warm and dry.     Capillary Refill: Capillary refill takes less than 2 seconds.     Comments: Numerous amount of moles on the face   Neurological:     Mental Status: She is alert and oriented to person, place, and time.         Assessment And Plan:     1. Breast pain -Left breast pain. Patient did not get mammogram last year.  -Will send patient for diagnostic mammogram for further evaluation due ot palpable small cyst/mass found on her left breast where the patient has pain.  - MM Digital Diagnostic Bilat; Future  2. Numerous moles -Numerous amount of moles on face. -Educated patient that it could be heredity. -Educated patient to watch for any changes in size, color of moles  -Will send patient to dermatologist for further evaluation.  - Ambulatory referral to Dermatology  3. Class 2 severe obesity due to excess calories with serious comorbidity and body mass index (BMI) of 39.0 to 39.9 in adult [facility])  -Educated patient on the importance of diet and exercise including 30-45 min of exercise daily. Advised her on a healthy diet.   Patient was given opportunity to ask questions. Patient verbalized understanding of the plan and was able to repeat key elements of the plan. All questions were answered to their satisfaction.  Anisah Kuck DNP   I, [place] DNP, have reviewed all documentation for this visit. The documentation on 07/07/20 for the exam, diagnosis, procedures, and orders are all accurate and complete.   IF YOU HAVE BEEN REFERRED TO A SPECIALIST, IT MAY TAKE 1-2 WEEKS TO  SCHEDULE/PROCESS THE REFERRAL. IF YOU HAVE NOT HEARD FROM US/SPECIALIST IN TWO WEEKS, PLEASE GIVE [place] A CALL AT [phone].   THE PATIENT IS ENCOURAGED TO PRACTICE SOCIAL DISTANCING DUE TO THE COVID-19 PANDEMIC.

## 2020-07-07 NOTE — Patient Instructions (Signed)

## 2020-07-26 DIAGNOSIS — M1990 Unspecified osteoarthritis, unspecified site: Secondary | ICD-10-CM | POA: Diagnosis not present

## 2020-07-26 DIAGNOSIS — E785 Hyperlipidemia, unspecified: Secondary | ICD-10-CM | POA: Diagnosis not present

## 2020-07-26 DIAGNOSIS — I10 Essential (primary) hypertension: Secondary | ICD-10-CM | POA: Diagnosis not present

## 2020-07-26 DIAGNOSIS — E559 Vitamin D deficiency, unspecified: Secondary | ICD-10-CM | POA: Diagnosis not present

## 2020-07-26 DIAGNOSIS — E119 Type 2 diabetes mellitus without complications: Secondary | ICD-10-CM | POA: Diagnosis not present

## 2020-07-28 ENCOUNTER — Other Ambulatory Visit: Payer: Self-pay | Admitting: Nurse Practitioner

## 2020-07-28 DIAGNOSIS — E119 Type 2 diabetes mellitus without complications: Secondary | ICD-10-CM

## 2020-07-29 ENCOUNTER — Ambulatory Visit
Admission: RE | Admit: 2020-07-29 | Discharge: 2020-07-29 | Disposition: A | Discharge status: Home / Self Care | Payer: Medicare Other | Source: Ambulatory Visit | Attending: Nurse Practitioner | Admitting: Nurse Practitioner

## 2020-07-29 ENCOUNTER — Other Ambulatory Visit: Payer: Self-pay

## 2020-07-29 DIAGNOSIS — N644 Mastodynia: Secondary | ICD-10-CM

## 2020-07-29 DIAGNOSIS — R922 Inconclusive mammogram: Secondary | ICD-10-CM | POA: Diagnosis not present

## 2020-08-04 ENCOUNTER — Ambulatory Visit (INDEPENDENT_AMBULATORY_CARE_PROVIDER_SITE_OTHER): Payer: Medicare Other | Admitting: Nurse Practitioner

## 2020-08-04 ENCOUNTER — Encounter: Payer: Self-pay | Admitting: Nurse Practitioner

## 2020-08-04 ENCOUNTER — Ambulatory Visit: Payer: Medicare Other

## 2020-08-04 ENCOUNTER — Other Ambulatory Visit: Payer: Self-pay

## 2020-08-04 VITALS — BP 126/62 | HR 98 | Temp 98.1°F | Ht 60.8 in | Wt 203.4 lb

## 2020-08-04 DIAGNOSIS — I10 Essential (primary) hypertension: Secondary | ICD-10-CM

## 2020-08-04 DIAGNOSIS — Z0000 Encounter for general adult medical examination without abnormal findings: Secondary | ICD-10-CM

## 2020-08-04 DIAGNOSIS — Z6838 Body mass index (BMI) 38.0-38.9, adult: Secondary | ICD-10-CM

## 2020-08-04 DIAGNOSIS — E669 Obesity, unspecified: Secondary | ICD-10-CM | POA: Diagnosis not present

## 2020-08-04 DIAGNOSIS — Z23 Encounter for immunization: Secondary | ICD-10-CM

## 2020-08-04 DIAGNOSIS — E559 Vitamin D deficiency, unspecified: Secondary | ICD-10-CM | POA: Diagnosis not present

## 2020-08-04 DIAGNOSIS — E119 Type 2 diabetes mellitus without complications: Secondary | ICD-10-CM | POA: Diagnosis not present

## 2020-08-04 LAB — POCT URINALYSIS DIPSTICK
Bilirubin, UA: NEGATIVE
Blood, UA: NEGATIVE
Glucose, UA: NEGATIVE
Ketones, UA: NEGATIVE
Leukocytes, UA: NEGATIVE
Nitrite, UA: NEGATIVE
Protein, UA: NEGATIVE
Spec Grav, UA: >=1.03 — AB (ref 1.010–1.025)
Urobilinogen, UA: 0.2 E.U./dL
pH, UA: 5.5 (ref 5.0–8.0)

## 2020-08-04 LAB — POCT UA - MICROALBUMIN
Albumin/Creatinine Ratio, Urine, POC: 30
Creatinine, POC: 300 mg/dL
Microalbumin Ur, POC: 30 mg/L

## 2020-08-04 NOTE — Progress Notes (Signed)
Rutherford Nail as a scribe for [name], FNP.,have documented all relevant documentation on the behalf of [name], FNP,as directed by  [name], FNP while in the presence of [name], [place]. This visit occurred during the SARS-CoV-2 public health emergency.  Safety protocols were in place, including screening questions prior to the visit, additional usage of staff PPE, and extensive cleaning of exam room while observing appropriate contact time as indicated for disinfecting solutions.  Subjective:     Patient ID: Joanna Sandoval , female    DOB: 1945/05/25 , 75 y.o.   MRN: [id number]   Chief Complaint  Patient presents with  . Annual Exam    HPI  Here for HM she is compliant with all medications and has no other complaints today.  She is taking Rybelsus but states "she does not like it because she has to wake up early to take the medication".  She is in the process of trying to get dentures for her bottom teeth.   Wt Readings from Last 3 Encounters: 08/04/20 : 203 lb 6.4 oz (92.3 kg) 07/07/20 : 206 lb 9.6 oz (93.7 kg) 05/31/20 : 206 lb (93.4 kg)    Hypertension This is a chronic problem. The current episode started more than 1 year ago. The problem is unchanged. The problem is controlled. Pertinent negatives include no chest pain, headaches or palpitations. There are no associated agents to hypertension. Risk factors for coronary artery disease include sedentary lifestyle, obesity, dyslipidemia and diabetes mellitus. Past treatments include angiotensin blockers. The current treatment provides no improvement. There are no compliance problems.  There is no history of angina. There is no history of chronic renal disease or coarctation of the aorta.     Past Medical History:  Diagnosis Date  . Arthritis   . Diabetes mellitus   . Hypercholesteremia   . Hypertension   . Post-menopausal bleeding      Family History  Problem Relation Age of Onset  .  Hypertension Mother   . Hypertension Father   . Cancer Father        ?lung  . Diabetes Sister      Current Outpatient Medications:  .  albuterol (PROVENTIL HFA;VENTOLIN HFA) 108 (90 Base) MCG/ACT inhaler, Inhale 2 puffs into the lungs every 6 (six) hours as needed., Disp: , Rfl:  .  atenolol (TENORMIN) 100 MG tablet, Take 50 mg by mouth daily. , Disp: , Rfl:  .  atorvastatin (LIPITOR) 40 MG tablet, Take 1 tablet (40 mg total) by mouth daily. Take 1/2 pill at bedtime, Disp: 90 tablet, Rfl: 1 .  Blood Glucose Monitoring Suppl (ACCU-CHEK GUIDE) w/Device KIT, USE TWICE DAILY AT 10AM AND 5PM, Disp: 1 kit, Rfl: 1 .  carboxymethylcellulose (REFRESH PLUS) 0.5 % SOLN, 1 drop 3 (three) times daily as needed., Disp: , Rfl:  .  D3-50 50000 units capsule, , Disp: , Rfl: 0 .  diclofenac sodium (VOLTAREN) 1 % GEL, Apply 2 g topically 4 (four) times daily., Disp: 100 g, Rfl: 1 .  glimepiride (AMARYL) 2 MG tablet, TAKE 1 TABLET BY MOUTH IN  THE MORNING AND AT BEDTIME, Disp: 180 tablet, Rfl: 3 .  glucose blood (ACCU-CHEK GUIDE) test strip, Use as instructed, Disp: 100 each, Rfl: 12 .  [name] lancets, Use as instructed, Disp: 100 each, Rfl: 12 .  [name] KIT, Use to test blood sugar twice daily as directed. E11.65, Disp: 1 kit, Rfl: 12 .  latanoprost (  XALATAN) 0.005 % ophthalmic solution, 1 drop at bedtime., Disp: , Rfl:  .  losartan (COZAAR) 50 MG tablet, Take 50 mg by mouth daily., Disp: , Rfl:  .  Magnesium 200 MG TABS, Take 1 tab by mouth daily with evening meal, Disp: 30 tablet, Rfl: 2 .  RYBELSUS 14 MG TABS, TAKE 1 TABLET BY MOUTH  DAILY 1/2 HOUR BEFORE  BREAKFAST, Disp: 90 tablet, Rfl: 3 .  naproxen (NAPROSYN) 500 MG tablet, Take 1 tablet (500 mg total) by mouth 2 (two) times daily., Disp: 30 tablet, Rfl: 0   No Known Allergies    The patient states she uses post menopausal status for birth control. Last LMP was No LMP recorded. Patient is  postmenopausal.. Negative for Dysmenorrhea and Negative for Menorrhagia. Negative for: breast discharge, breast lump(s), breast pain and breast self exam. Associated symptoms include abnormal vaginal bleeding. Pertinent negatives include abnormal bleeding (hematology), anxiety, decreased libido, depression, difficulty falling sleep, dyspareunia, history of infertility, nocturia, sexual dysfunction, sleep disturbances, urinary incontinence, urinary urgency, vaginal discharge and vaginal itching. Diet regular.The patient states her exercise level is    . The patient's tobacco use is:  Social History   Tobacco Use  Smoking Status Former Smoker  . Types: Cigarettes  Smokeless Tobacco Never Used  . She has been exposed to passive smoke. The patient's alcohol use is:  Social History   Substance and Sexual Activity  Alcohol Use No     Review of Systems  Constitutional: Negative.  Negative for fatigue.  HENT: Negative.   Cardiovascular: Negative for chest pain and palpitations.  Endocrine: Negative for polydipsia, polyphagia and polyuria.  Musculoskeletal:       Bump around collar area (R) side  Skin: Negative.   Neurological: Negative for dizziness and headaches.  Psychiatric/Behavioral: Negative.      Today's Vitals   08/04/20 0918  BP: 126/62  Pulse: 98  Temp: 98.1 F (36.7 C)  TempSrc: Oral  Weight: 203 lb 6.4 oz (92.3 kg)  Height: 5' 0.8" (1.544 m)   Body mass index is 38.69 kg/m.  Wt Readings from Last 3 Encounters:  08/04/20 203 lb 6.4 oz (92.3 kg)  07/07/20 206 lb 9.6 oz (93.7 kg)  05/31/20 206 lb (93.4 kg)   Objective:  Physical Exam Constitutional:      General: She is not in acute distress.    Appearance: Normal appearance. She is well-developed. She is obese.  HENT:     Head: Normocephalic and atraumatic.     Right Ear: Hearing, tympanic membrane, ear canal and external ear normal. There is no impacted cerumen.     Left Ear: Hearing, tympanic membrane, ear  canal and external ear normal. There is no impacted cerumen.     Nose:     Comments: Deferred - mask    Mouth/Throat:     Comments: Deferred - mask Eyes:     General: Lids are normal.     Extraocular Movements: Extraocular movements intact.     Conjunctiva/sclera: Conjunctivae normal.     Pupils: Pupils are equal, round, and reactive to light.     Funduscopic exam:    Right eye: No papilledema.        Left eye: No papilledema.  Neck:     Thyroid: No thyroid mass.     Vascular: No carotid bruit.  Cardiovascular:     Rate and Rhythm: Normal rate and regular rhythm.     Pulses: Normal pulses.     Heart sounds:  Normal heart sounds. No murmur heard.   Pulmonary:     Effort: Pulmonary effort is normal. No respiratory distress.     Breath sounds: Normal breath sounds. No wheezing.  Abdominal:     General: Abdomen is flat. Bowel sounds are normal. There is no distension.     Palpations: Abdomen is soft. There is no mass.     Tenderness: There is no abdominal tenderness.  Musculoskeletal:        General: No swelling. Normal range of motion.     Cervical back: Full passive range of motion without pain, normal range of motion and neck supple.     Right lower leg: No edema.     Left lower leg: No edema.  Skin:    General: Skin is warm and dry.     Capillary Refill: Capillary refill takes less than 2 seconds.  Neurological:     General: No focal deficit present.     Mental Status: She is alert and oriented to person, place, and time.     Cranial Nerves: No cranial nerve deficit.     Sensory: No sensory deficit.  Psychiatric:        Mood and Affect: Mood normal.        Behavior: Behavior normal.        Thought Content: Thought content normal.        Judgment: Judgment normal.         Assessment And Plan:     1. Health maintenance examination . Behavior modifications discussed and diet history reviewed.   . Pt will continue to exercise regularly and modify diet with low GI,  plant based foods and decrease intake of processed foods.  . Recommend intake of daily multivitamin, Vitamin D, and calcium.  . Recommend mammogram and colonoscopy (up to date) for preventive screenings, as well as recommend immunizations that include influenza, TDAP, will administer pneumonia 23 today in office - VITAMIN D 25 Hydroxy (Vit-D Deficiency, Fractures) - CBC  2. Type 2 diabetes mellitus without complication, without long-term current use of insulin (HCC)  Chronic, controlled  Continue with current medications  Encouraged to limit intake of sugary foods and drinks  Encouraged to increase physical activity to 150 minutes per week  Diabetic foot exam done, decreased sensation to left foot  Eye exam is up to date - Lipid panel - Hemoglobin A1c  3. Essential hypertension . B/P is well controlled.  . CMP ordered to check renal function.  . The importance of regular exercise and dietary modification was stressed to the patient.  . Stressed importance of losing ten percent of her body weight to help with B/P control.  . The weight loss would help with decreasing cardiac and cancer risk as well.  - POCT Urinalysis Dipstick (81002) - POCT UA - Microalbumin - CMP14+EGFR - CBC  4. Obesity (BMI 30-39.9)  Chronic  Discussed healthy diet and regular exercise options   Encouraged to exercise at least 150 minutes per week with 2 days of strength training  She is encouraged to strive for BMI less than 30 to decrease cardiac risk.     Patient was given opportunity to ask questions. Patient verbalized understanding of the plan and was able to repeat key elements of the plan. All questions were answered to their satisfaction.   [name], FNP   I, [name], FNP, have reviewed all documentation for this visit. The documentation on 08/04/20 for the exam, diagnosis, procedures, and orders are all  accurate and complete.   THE PATIENT IS ENCOURAGED TO PRACTICE SOCIAL  DISTANCING DUE TO THE COVID-19 PANDEMIC.

## 2020-08-04 NOTE — Patient Instructions (Signed)
Diabetes Mellitus and Exercise Exercising regularly is important for overall health, especially for people who have diabetes mellitus. Exercising is not only about losing weight. It has many other health benefits, such as increasing muscle strength and bone density and reducing body fat and stress. This leads to improved fitness, flexibility, and endurance, all of which result in better overall health. What are the benefits of exercise if I have diabetes? Exercise has many benefits for people with diabetes. They include:  Helping to lower and control blood sugar (glucose).  Helping the body to respond better to the hormone insulin by improving insulin sensitivity.  Reducing how much insulin the body needs.  Lowering the risk for heart disease by: ? Lowering "bad" cholesterol and triglyceride levels. ? Increasing "good" cholesterol levels. ? Lowering blood pressure. ? Lowering blood glucose levels. What is my activity plan? Your health care provider or certified diabetes educator can help you make a plan for the type and frequency of exercise that works for you. This is called your activity plan. Be sure to:  Get at least 150 minutes of medium-intensity or high-intensity exercise each week. Exercises may include brisk walking, biking, or water aerobics.  Do stretching and strengthening exercises, such as yoga or weight lifting, at least 2 times a week.  Spread out your activity over at least 3 days of the week.  Get some form of physical activity each day. ? Do not go more than 2 days in a row without some kind of physical activity. ? Avoid being inactive for more than 90 minutes at a time. Take frequent breaks to walk or stretch.  Choose exercises or activities that you enjoy. Set realistic goals.  Start slowly and gradually increase your exercise intensity over time.   How do I manage my diabetes during exercise? Monitor your blood glucose  Check your blood glucose before and  after exercising. If your blood glucose is: ? 240 mg/dL (13.3 mmol/L) or higher before you exercise, check your urine for ketones. These are chemicals created by the liver. If you have ketones in your urine, do not exercise until your blood glucose returns to normal. ? 100 mg/dL (5.6 mmol/L) or lower, eat a snack containing 15-20 grams of carbohydrate. Check your blood glucose 15 minutes after the snack to make sure that your glucose level is above 100 mg/dL (5.6 mmol/L) before you start your exercise.  Know the symptoms of low blood glucose (hypoglycemia) and how to treat it. Your risk for hypoglycemia increases during and after exercise. Follow these tips and your health care provider's instructions  Keep a carbohydrate snack that is fast-acting for use before, during, and after exercise to help prevent or treat hypoglycemia.  Avoid injecting insulin into areas of the body that are going to be exercised. For example, avoid injecting insulin into: ? Your arms, when you are about to play tennis. ? Your legs, when you are about to go jogging.  Keep records of your exercise habits. Doing this can help you and your health care provider adjust your diabetes management plan as needed. Write down: ? Food that you eat before and after you exercise. ? Blood glucose levels before and after you exercise. ? The type and amount of exercise you have done.  Work with your health care provider when you start a new exercise or activity. He or she may need to: ? Make sure that the activity is safe for you. ? Adjust your insulin, other medicines, and food that   you eat.  Drink plenty of water while you exercise. This prevents loss of water (dehydration) and problems caused by a lot of heat in the body (heat stroke).   Where to find more information  American Diabetes Association: www.diabetes.org Summary  Exercising regularly is important for overall health, especially for people who have diabetes  mellitus.  Exercising has many health benefits. It increases muscle strength and bone density and reduces body fat and stress. It also lowers and controls blood glucose.  Your health care provider or certified diabetes educator can help you make an activity plan for the type and frequency of exercise that works for you.  Work with your health care provider to make sure any new activity is safe for you. Also work with your health care provider to adjust your insulin, other medicines, and the food you eat. This information is not intended to replace advice given to you by your health care provider. Make sure you discuss any questions you have with your health care provider. Document Revised: 12/30/2018 Document Reviewed: 12/30/2018 Elsevier Patient Education  2021 Elsevier Inc. Hypertension, Adult Hypertension is another name for high blood pressure. High blood pressure forces your heart to work harder to pump blood. This can cause problems over time. There are two numbers in a blood pressure reading. There is a top number (systolic) over a bottom number (diastolic). It is best to have a blood pressure that is below 120/80. Healthy choices can help lower your blood pressure, or you may need medicine to help lower it. What are the causes? The cause of this condition is not known. Some conditions may be related to high blood pressure. What increases the risk?  Smoking.  Having type 2 diabetes mellitus, high cholesterol, or both.  Not getting enough exercise or physical activity.  Being overweight.  Having too much fat, sugar, calories, or salt (sodium) in your diet.  Drinking too much alcohol.  Having long-term (chronic) kidney disease.  Having a family history of high blood pressure.  Age. Risk increases with age.  Race. You may be at higher risk if you are African American.  Gender. Men are at higher risk than women before age 45. After age 65, women are at higher risk than  men.  Having obstructive sleep apnea.  Stress. What are the signs or symptoms?  High blood pressure may not cause symptoms. Very high blood pressure (hypertensive crisis) may cause: ? Headache. ? Feelings of worry or nervousness (anxiety). ? Shortness of breath. ? Nosebleed. ? A feeling of being sick to your stomach (nausea). ? Throwing up (vomiting). ? Changes in how you see. ? Very bad chest pain. ? Seizures. How is this treated?  This condition is treated by making healthy lifestyle changes, such as: ? Eating healthy foods. ? Exercising more. ? Drinking less alcohol.  Your health care provider may prescribe medicine if lifestyle changes are not enough to get your blood pressure under control, and if: ? Your top number is above 130. ? Your bottom number is above 80.  Your personal target blood pressure may vary. Follow these instructions at home: Eating and drinking  If told, follow the DASH eating plan. To follow this plan: ? Fill one half of your plate at each meal with fruits and vegetables. ? Fill one fourth of your plate at each meal with whole grains. Whole grains include whole-wheat pasta, brown rice, and whole-grain bread. ? Eat or drink low-fat dairy products, such as skim milk or   low-fat yogurt. ? Fill one fourth of your plate at each meal with low-fat (lean) proteins. Low-fat proteins include fish, chicken without skin, eggs, beans, and tofu. ? Avoid fatty meat, cured and processed meat, or chicken with skin. ? Avoid pre-made or processed food.  Eat less than 1,500 mg of salt each day.  Do not drink alcohol if: ? Your doctor tells you not to drink. ? You are pregnant, may be pregnant, or are planning to become pregnant.  If you drink alcohol: ? Limit how much you use to:  0-1 drink a day for women.  0-2 drinks a day for men. ? Be aware of how much alcohol is in your drink. In the [place] one drink equals one 12 oz bottle of beer (355 mL), one 5 oz glass  of wine (148 mL), or one 1 oz glass of hard liquor (44 mL).   Lifestyle  Work with your doctor to stay at a healthy weight or to lose weight. Ask your doctor what the best weight is for you.  Get at least 30 minutes of exercise most days of the week. This may include walking, swimming, or biking.  Get at least 30 minutes of exercise that strengthens your muscles (resistance exercise) at least 3 days a week. This may include lifting weights or doing Pilates.  Do not use any products that contain nicotine or tobacco, such as cigarettes, e-cigarettes, and chewing tobacco. If you need help quitting, ask your doctor.  Check your blood pressure at home as told by your doctor.  Keep all follow-up visits as told by your doctor. This is important.   Medicines  Take over-the-counter and prescription medicines only as told by your doctor. Follow directions carefully.  Do not skip doses of blood pressure medicine. The medicine does not work as well if you skip doses. Skipping doses also puts you at risk for problems.  Ask your doctor about side effects or reactions to medicines that you should watch for. Contact a doctor if you:  Think you are having a reaction to the medicine you are taking.  Have headaches that keep coming back (recurring).  Feel dizzy.  Have swelling in your ankles.  Have trouble with your vision. Get help right away if you:  Get a very bad headache.  Start to feel mixed up (confused).  Feel weak or numb.  Feel faint.  Have very bad pain in your: ? Chest. ? Belly (abdomen).  Throw up more than once.  Have trouble breathing. Summary  Hypertension is another name for high blood pressure.  High blood pressure forces your heart to work harder to pump blood.  For most people, a normal blood pressure is less than 120/80.  Making healthy choices can help lower blood pressure. If your blood pressure does not get lower with healthy choices, you may need to  take medicine. This information is not intended to replace advice given to you by your health care provider. Make sure you discuss any questions you have with your health care provider. Document Revised: 12/12/2017 Document Reviewed: 12/12/2017 Elsevier Patient Education  2021 Elsevier Inc. Health Maintenance, Female Adopting a healthy lifestyle and getting preventive care are important in promoting health and wellness. Ask your health care provider about:  The right schedule for you to have regular tests and exams.  Things you can do on your own to prevent diseases and keep yourself healthy. What should I know about diet, weight, and exercise? Eat a healthy diet    Eat a diet that includes plenty of vegetables, fruits, low-fat dairy products, and lean protein.  Do not eat a lot of foods that are high in solid fats, added sugars, or sodium.   Maintain a healthy weight Body mass index (BMI) is used to identify weight problems. It estimates body fat based on height and weight. Your health care provider can help determine your BMI and help you achieve or maintain a healthy weight. Get regular exercise Get regular exercise. This is one of the most important things you can do for your health. Most adults should:  Exercise for at least 150 minutes each week. The exercise should increase your heart rate and make you sweat (moderate-intensity exercise).  Do strengthening exercises at least twice a week. This is in addition to the moderate-intensity exercise.  Spend less time sitting. Even light physical activity can be beneficial. Watch cholesterol and blood lipids Have your blood tested for lipids and cholesterol at 74 years of age, then have this test every 5 years. Have your cholesterol levels checked more often if:  Your lipid or cholesterol levels are high.  You are older than 74 years of age.  You are at high risk for heart disease. What should I know about cancer  screening? Depending on your health history and family history, you may need to have cancer screening at various ages. This may include screening for:  Breast cancer.  Cervical cancer.  Colorectal cancer.  Skin cancer.  Lung cancer. What should I know about heart disease, diabetes, and high blood pressure? Blood pressure and heart disease  High blood pressure causes heart disease and increases the risk of stroke. This is more likely to develop in people who have high blood pressure readings, are of African descent, or are overweight.  Have your blood pressure checked: ? Every 3-5 years if you are 18-39 years of age. ? Every year if you are 40 years old or older. Diabetes Have regular diabetes screenings. This checks your fasting blood sugar level. Have the screening done:  Once every three years after age 40 if you are at a normal weight and have a low risk for diabetes.  More often and at a younger age if you are overweight or have a high risk for diabetes. What should I know about preventing infection? Hepatitis B If you have a higher risk for hepatitis B, you should be screened for this virus. Talk with your health care provider to find out if you are at risk for hepatitis B infection. Hepatitis C Testing is recommended for:  Everyone born from 1945 through 1965.  Anyone with known risk factors for hepatitis C. Sexually transmitted infections (STIs)  Get screened for STIs, including gonorrhea and chlamydia, if: ? You are sexually active and are younger than 74 years of age. ? You are older than 74 years of age and your health care provider tells you that you are at risk for this type of infection. ? Your sexual activity has changed since you were last screened, and you are at increased risk for chlamydia or gonorrhea. Ask your health care provider if you are at risk.  Ask your health care provider about whether you are at high risk for HIV. Your health care provider may  recommend a prescription medicine to help prevent HIV infection. If you choose to take medicine to prevent HIV, you should first get tested for HIV. You should then be tested every 3 months for as long as you are   taking the medicine. Pregnancy  If you are about to stop having your period (premenopausal) and you may become pregnant, seek counseling before you get pregnant.  Take 400 to 800 micrograms (mcg) of folic acid every day if you become pregnant.  Ask for birth control (contraception) if you want to prevent pregnancy. Osteoporosis and menopause Osteoporosis is a disease in which the bones lose minerals and strength with aging. This can result in bone fractures. If you are 65 years old or older, or if you are at risk for osteoporosis and fractures, ask your health care provider if you should:  Be screened for bone loss.  Take a calcium or vitamin D supplement to lower your risk of fractures.  Be given hormone replacement therapy (HRT) to treat symptoms of menopause. Follow these instructions at home: Lifestyle  Do not use any products that contain nicotine or tobacco, such as cigarettes, e-cigarettes, and chewing tobacco. If you need help quitting, ask your health care provider.  Do not use street drugs.  Do not share needles.  Ask your health care provider for help if you need support or information about quitting drugs. Alcohol use  Do not drink alcohol if: ? Your health care provider tells you not to drink. ? You are pregnant, may be pregnant, or are planning to become pregnant.  If you drink alcohol: ? Limit how much you use to 0-1 drink a day. ? Limit intake if you are breastfeeding.  Be aware of how much alcohol is in your drink. In the [place] one drink equals one 12 oz bottle of beer (355 mL), one 5 oz glass of wine (148 mL), or one 1 oz glass of hard liquor (44 mL). General instructions  Schedule regular health, dental, and eye exams.  Stay current with your  vaccines.  Tell your health care provider if: ? You often feel depressed. ? You have ever been abused or do not feel safe at home. Summary  Adopting a healthy lifestyle and getting preventive care are important in promoting health and wellness.  Follow your health care provider's instructions about healthy diet, exercising, and getting tested or screened for diseases.  Follow your health care provider's instructions on monitoring your cholesterol and blood pressure. This information is not intended to replace advice given to you by your health care provider. Make sure you discuss any questions you have with your health care provider. Document Revised: 03/27/2018 Document Reviewed: 03/27/2018 Elsevier Patient Education  2021 Elsevier Inc.  

## 2020-08-05 ENCOUNTER — Ambulatory Visit (INDEPENDENT_AMBULATORY_CARE_PROVIDER_SITE_OTHER): Payer: Medicare Other

## 2020-08-05 ENCOUNTER — Telehealth: Payer: Self-pay

## 2020-08-05 ENCOUNTER — Telehealth: Payer: Medicare Other

## 2020-08-05 DIAGNOSIS — I10 Essential (primary) hypertension: Secondary | ICD-10-CM | POA: Diagnosis not present

## 2020-08-05 DIAGNOSIS — E559 Vitamin D deficiency, unspecified: Secondary | ICD-10-CM

## 2020-08-05 DIAGNOSIS — E119 Type 2 diabetes mellitus without complications: Secondary | ICD-10-CM | POA: Diagnosis not present

## 2020-08-05 LAB — HEMOGLOBIN A1C
Est. average glucose Bld gHb Est-mCnc: 146 mg/dL
Hgb A1c MFr Bld: 6.7 % — ABNORMAL HIGH (ref 4.8–5.6)

## 2020-08-05 LAB — CBC
Hematocrit: 39.6 % (ref 34.0–46.6)
Hemoglobin: 12.9 g/dL (ref 11.1–15.9)
MCH: 28.4 pg (ref 26.6–33.0)
MCHC: 32.6 g/dL (ref 31.5–35.7)
MCV: 87 fL (ref 79–97)
Platelets: 284 10*3/uL (ref 150–450)
RBC: 4.55 x10E6/uL (ref 3.77–5.28)
RDW: 18.1 % — ABNORMAL HIGH (ref 11.7–15.4)
WBC: 11.9 10*3/uL — ABNORMAL HIGH (ref 3.4–10.8)

## 2020-08-05 LAB — CMP14+EGFR
ALT: 9 IU/L (ref 0–32)
AST: 13 IU/L (ref 0–40)
Albumin/Globulin Ratio: 1.5 (ref 1.2–2.2)
Albumin: 4.3 g/dL (ref 3.7–4.7)
Alkaline Phosphatase: 137 IU/L — ABNORMAL HIGH (ref 44–121)
BUN/Creatinine Ratio: 21 (ref 12–28)
BUN: 17 mg/dL (ref 8–27)
Bilirubin Total: 0.5 mg/dL (ref 0.0–1.2)
CO2: 26 mmol/L (ref 20–29)
Calcium: 9.9 mg/dL (ref 8.7–10.3)
Chloride: 105 mmol/L (ref 96–106)
Creatinine, Ser: 0.81 mg/dL (ref 0.57–1.00)
Globulin, Total: 2.8 g/dL (ref 1.5–4.5)
Glucose: 79 mg/dL (ref 65–99)
Potassium: 4.8 mmol/L (ref 3.5–5.2)
Sodium: 146 mmol/L — ABNORMAL HIGH (ref 134–144)
Total Protein: 7.1 g/dL (ref 6.0–8.5)
eGFR: 77 mL/min/{1.73_m2} (ref >59)

## 2020-08-05 LAB — LIPID PANEL
Chol/HDL Ratio: 2.8 ratio (ref 0.0–4.4)
Cholesterol, Total: 156 mg/dL (ref 100–199)
HDL: 55 mg/dL (ref >39)
LDL Chol Calc (NIH): 79 mg/dL (ref 0–99)
Triglycerides: 125 mg/dL (ref 0–149)
VLDL Cholesterol Cal: 22 mg/dL (ref 5–40)

## 2020-08-05 LAB — VITAMIN D 25 HYDROXY (VIT D DEFICIENCY, FRACTURES): Vit D, 25-Hydroxy: 27.7 ng/mL — ABNORMAL LOW (ref 30.0–100.0)

## 2020-08-05 NOTE — Telephone Encounter (Signed)
  Chronic Care Management   Outreach Note  08/05/2020 Name: Caliope Ruppert MRN: [id number] DOB: [birth year]  Referred by: [name], FNP Reason for referral : Chronic Care Management (RN CM FU Call Attempt )   An unsuccessful telephone outreach was attempted today. I spoke with [name] briefly and requested a call back later today. The patient was referred to the case management team for assistance with care management and care coordination.   Follow Up Plan: Telephone follow up appointment with care management team member scheduled for: 08/05/20 @3  PM   [name], RN, BSN, CCM Care Management Coordinator [place] [facility]  Direct Phone: [phone]

## 2020-08-06 ENCOUNTER — Telehealth: Payer: Self-pay

## 2020-08-06 NOTE — Telephone Encounter (Signed)
Left a message pts sister Bobbi Huntely.

## 2020-08-06 NOTE — Telephone Encounter (Signed)
-----   Message from [name], [place] sent at 08/05/2020  8:37 AM EDT ----- Your sodium level is slightly up, make sure you stay well hydrated with water. Kidney and liver functions are normal. Also cut back on processed foods. Vitamin d is 27.7, are you taking vitamin d supplement? Your whit count is up a little, are you having any type of cough or urinary symptoms? Cholesterol levels are normal. HgbA1c is much better down to 6.7 from 8.7 this is definitely going in the right direction. Continue your current medications

## 2020-08-11 ENCOUNTER — Telehealth: Payer: Self-pay

## 2020-08-11 ENCOUNTER — Other Ambulatory Visit: Payer: Self-pay

## 2020-08-11 DIAGNOSIS — R82998 Other abnormal findings in urine: Secondary | ICD-10-CM

## 2020-08-11 NOTE — Telephone Encounter (Signed)
Your sodium level is slightly up, make sure you stay well hydrated with water. Kidney and liver functions are normal. Also cut back on processed foods. Vitamin d is 27.7, are you taking vitamin d supplement? Your whit count is up a little, are you having any type of cough or urinary symptoms? Cholesterol levels are normal. HgbA1c is much better down to 6.7 from 8.7 this is definitely going in the right direction. Continue your current medications  Patient notified of results she stated she is taking vitamin d 50,000 units weekly and she does not have a cough or urinary symptoms but she does report having back pain so I advised her to come drop off a urine sample so we can send it for a urine culture per your request. Tyler Deis

## 2020-08-12 ENCOUNTER — Other Ambulatory Visit: Payer: Medicare Other

## 2020-08-12 ENCOUNTER — Other Ambulatory Visit: Payer: Self-pay

## 2020-08-12 DIAGNOSIS — R82998 Other abnormal findings in urine: Secondary | ICD-10-CM

## 2020-08-13 ENCOUNTER — Other Ambulatory Visit: Payer: Medicare Other

## 2020-08-13 DIAGNOSIS — R82998 Other abnormal findings in urine: Secondary | ICD-10-CM | POA: Diagnosis not present

## 2020-08-14 NOTE — Chronic Care Management (AMB) (Signed)
Chronic Care Management   CCM RN Visit Note  08/05/2020 Name: Joanna Sandoval MRN: [id number] DOB: [birth year]  Subjective: Joanna Sandoval is a 75 y.o. year old female who is a primary care patient of [name], [place]. The care management team was consulted for assistance with disease management and care coordination needs.    Engaged with patient by telephone for follow up visit in response to provider referral for case management and/or care coordination services.   Consent to Services:  The patient was given information about Chronic Care Management services, agreed to services, and gave verbal consent prior to initiation of services.  Please see initial visit note for detailed documentation.   Patient agreed to services and verbal consent obtained.   Assessment: Review of patient past medical history, allergies, medications, health status, including review of consultants reports, laboratory and other test data, was performed as part of comprehensive evaluation and provision of chronic care management services.   SDOH (Social Determinants of Health) assessments and interventions performed:    CCM Care Plan  No Known Allergies  Outpatient Encounter Medications as of 08/05/2020  Medication Sig  . albuterol (PROVENTIL HFA;VENTOLIN HFA) 108 (90 Base) MCG/ACT inhaler Inhale 2 puffs into the lungs every 6 (six) hours as needed.  [name] atenolol (TENORMIN) 100 MG tablet Take 50 mg by mouth daily.   [name] atorvastatin (LIPITOR) 40 MG tablet Take 1 tablet (40 mg total) by mouth daily. Take 1/2 pill at bedtime  . Blood Glucose Monitoring Suppl (ACCU-CHEK GUIDE) w/Device KIT USE TWICE DAILY AT 10AM AND 5PM  . carboxymethylcellulose (REFRESH PLUS) 0.5 % SOLN 1 drop 3 (three) times daily as needed.  . D3-50 50000 units capsule   . diclofenac sodium (VOLTAREN) 1 % GEL Apply 2 g topically 4 (four) times daily.  [name] glimepiride (AMARYL) 2 MG tablet TAKE 1 TABLET BY MOUTH IN  THE MORNING AND AT BEDTIME  .  glucose blood (ACCU-CHEK GUIDE) test strip Use as instructed  . [name] lancets Use as instructed  . [name] KIT Use to test blood sugar twice daily as directed. E11.65  . latanoprost (XALATAN) 0.005 % ophthalmic solution 1 drop at bedtime.  [name] losartan (COZAAR) 50 MG tablet Take 50 mg by mouth daily.  . Magnesium 200 MG TABS Take 1 tab by mouth daily with evening meal  . naproxen (NAPROSYN) 500 MG tablet Take 1 tablet (500 mg total) by mouth 2 (two) times daily.  . RYBELSUS 14 MG TABS TAKE 1 TABLET BY MOUTH  DAILY 1/2 HOUR BEFORE  BREAKFAST   No facility-administered encounter medications on file as of 08/05/2020.    Patient Active Problem List   Diagnosis Date Noted  . Abnormal feces 05/05/2020  . Abnormal weight gain 05/05/2020  . Colon cancer screening 05/05/2020  . Morbid obesity ([place]) 05/05/2020  . Type 2 diabetes mellitus without complication, without long-term current use of insulin ([place]) 07/31/2018  . Essential hypertension 04/30/2018  . Uncontrolled type 2 diabetes mellitus with hyperglycemia ([place]) 04/30/2018  . Postmenopausal vaginal bleeding   . Fibroid 11/11/2011  . Postmenopausal bleeding 08/30/2011    Conditions to be addressed/monitored:DM, Essential HTN, Vitamin D deficiency   Care Plan : Diabetes Type 2 (Adult)  Updates made by [name], RN since 08/14/2020 12:00 AM    Problem: Glycemic Management (Diabetes, Type 2)   Priority: High    Long-Range Goal: Glycemic Management Optimized   Start Date: 08/05/2020  Expected End Date: 02/04/2021  Recent Progress: On track  Priority: High  Note:   Objective:  Lab Results  Component Value Date   HGBA1C 8.7 (H) 01/29/2020 .   Lab Results  Component Value Date   CREATININE 0.94 01/29/2020   CREATININE 0.90 10/29/2019   CREATININE 1.15 (H) 07/30/2019   . No results found for: EGFR Current Barriers:  [name] Knowledge Deficits related to basic Diabetes  pathophysiology and self care/management . Knowledge Deficits related to medications used for management of diabetes Case Manager Clinical Goal(s):  [name] Patient will demonstrate improved adherence to prescribed treatment plan for diabetes self care/management as evidenced by:  . daily monitoring and recording of CBG  . adherence to ADA/ carb modified diet . exercise 3-5 days/week . adherence to prescribed medication regimen Interventions:  . Collaboration with [name] FNP regarding development and update of comprehensive plan of care as evidenced by provider attestation and co-signature . Inter-disciplinary care team collaboration (see longitudinal plan of care) . 08/05/20 completed successful outbound call to patient  . Provided education to patient about basic DM disease process . Review of patient status, including review of consultants reports, relevant laboratory and other test results, and medications completed. . Educated patient on dietary and exercise recommendations; daily glycemic control FBS 80-130, <180 after meals;15'15' rule . Reviewed medications with patient and discussed importance of medication adherence . Advised patient, providing education and rationale, to check cbg before meals and at bedtime and record, calling the CCM team and or PCP for findings outside established parameters.    Discussed plans with patient for ongoing care management follow up and provided patient with direct contact information for care management team Patient Self-Care Activities  Self administers oral medications as prescribed Attends all scheduled provider appointments Checks blood sugars as prescribed and utilize hyper and hypoglycemia protocol as needed Adheres to prescribed ADA/carb modified Patient Goal:  - lower A1c Follow Up Plan: Telephone follow up appointment with care management team member scheduled for: 11/10/20   Problem: Disease Progression (Diabetes, Type 2)   Priority:  High    Long-Range Goal: Disease Progression Prevented or Minimized   Start Date: 08/05/2020  Expected End Date: 02/04/2021  Recent Progress: On track  Priority: High  Note:   Objective:  Lab Results  Component Value Date   HGBA1C 8.7 (H) 01/29/2020 .   Lab Results  Component Value Date   CREATININE 0.94 01/29/2020   CREATININE 0.90 10/29/2019   CREATININE 1.15 (H) 07/30/2019 .   [name] No results found for: EGFR Current Barriers:  [name] Knowledge Deficits related to basic Diabetes pathophysiology and self care/management Case Manager Clinical Goal(s):  [name] Patient will demonstrate improved adherence to prescribed treatment plan for diabetes self care/management as evidenced by:  . daily monitoring and recording of CBG  . adherence to ADA/ carb modified diet . exercise 3-5 days/week . adherence to prescribed medication regimen Interventions:  . Collaboration with [name] FNP regarding development and update of comprehensive plan of care as evidenced by provider attestation and co-signature . Inter-disciplinary care team collaboration (see longitudinal plan of care) . Provided education to patient about basic DM disease process . Review of patient status, including review of consultants reports, relevant laboratory and other test results, and medications completed. . Educated patient on dietary and exercise recommendations; daily glycemic control FBS 80-130, <180 after meals;15'15' rule  Encouraged lifestyle changes, such as increased intake of plant-based foods, stress reduction, consistent physical activity and smoking cessation to prevent long-term complications and chronic disease.  Individualized activity and exercise recommendations while considering potential limitations, such as neuropathy, retinopathy or the ability to prevent hyperglycemia or hypoglycemia.    Discussed plans with patient for ongoing care management follow up and provided patient with direct contact  information for care management team Patient Goals/Self-Care Activities - check blood sugar at prescribed times - check blood sugar before and after exercise - check blood sugar if I feel it is too high or too low - enter blood sugar readings and medication or insulin into daily log - take the blood sugar log to all doctor visits - take the blood sugar meter to all doctor visits  Follow Up Plan: Telephone follow up appointment with care management team member scheduled for: 11/10/20   Care Plan : Coronary Artery Disease (Adult)  Updates made by [name], RN since 08/14/2020 12:00 AM  Completed 08/14/2020  Problem: Disease Progression (Coronary Artery Disease) Resolved 08/05/2020  Priority: High  Onset Date: 03/24/2020    Long-Range Goal: Disease Progression Prevented or Minimized Completed 08/05/2020  Start Date: 03/24/2020  Expected End Date: 06/22/2020  Recent Progress: On track  Priority: High  Note:   Current Barriers:   Ineffective Self Health Maintenance  Currently UNABLE TO independently self manage needs related to chronic health conditions.   Knowledge Deficits related to short term plan for care coordination needs and long term plans for chronic disease management needs Nurse Case Manager Clinical Goal(s):   Over the next 90 days, patient will work with care management team to address care coordination and chronic disease management needs related to Disease Management  Educational Needs  Care Coordination  Medication Management and Education  Psychosocial Support   Interventions:  08/05/20 completed successful call with patient  . Collaboration with [name] FNP regarding development and update of comprehensive plan of care as evidenced by provider attestation and co-signature . Inter-disciplinary care team collaboration (see longitudinal plan of care) . Provided education to patient about basic disease process related to elevated Triglycerides . Review of  patient status, including review of consultants reports, relevant laboratory and other test results, and medications completed. . Reviewed medications with patient and discussed importance of medication adherence . Reviewed scheduled/upcoming provider appointments including: next PCP follow up appointment scheduled for 11/08/20 $[email or web address]$ :00 AM  . Discussed plans with patient for ongoing care management follow up and provided patient with direct contact information for care management team Patient Goals/Self Care Activities:  Self administers oral medications as prescribed Attends all scheduled provider appointments Adheres to prescribed low fat, heart healthy diet  -Review patient educational article "Triglycerides, Why Do They Matter"? (mailed to patient) -Follow up with Vascular Surgeon to further evaluate abnormal peripheral artery study   Follow Up Plan: Telephone follow up appointment with care management team member scheduled for: 05/06/20   Care Plan : Vitamin D deficiency  Updates made by [name], RN since 08/14/2020 12:00 AM    Problem: Vitamin D deficiency   Priority: High    Long-Range Goal: Vitamin D deficinecy improved or resolved   Start Date: 08/05/2020  Expected End Date: 02/04/2021  This Visit's Progress: On track  Priority: High  Note:   Current Barriers:   Ineffective Self Health Maintenance  Clinical Goal(s):  [name] Collaboration with [name], FNP regarding development and update of comprehensive plan of care as evidenced by provider attestation and co-signature . Inter-disciplinary care team collaboration (see longitudinal plan of care)  patient will work with care management team to address care  coordination and chronic disease management needs related to Disease Management  Educational Needs  Care Coordination  Medication Management and Education  Psychosocial Support   Interventions:   Evaluation of current treatment plan related to  Vitamin D  deficiency , self-management and patient's adherence to plan as established by provider.  Collaboration with [name], FNP regarding development and update of comprehensive plan of care as evidenced by provider attestation       and co-signature  Inter-disciplinary care team collaboration (see longitudinal plan of care) . 08/05/20 completed successful call with patient  . Provided education to patient about basic disease process related to Vitamin D deficiency . Review of patient status, including review of consultants reports, relevant laboratory and other test results, and medications completed. . Reviewed medications with patient and discussed importance of medication adherence . Reviewed scheduled/upcoming provider appointments including: next PCP follow up appointment scheduled for 11/08/20 $[email or web address]$ :00 AM   Discussed plans with patient for ongoing care management follow up and provided patient with direct contact information for care management team Self Care Activities:  . Continue to keep all scheduled follow up appointments . Take medications as directed  . Let your healthcare team know if you are unable to take your medications . Call your pharmacy for refills at least 7 days prior to running out of medication . Eat a Vitamin D rich diet and get at least 15 minutes of natural sunlight daily when able  Patient Goals: - improve or resolve Vitamin D deficiency  Follow Up Plan: Telephone follow up appointment with care management team member scheduled for: 11/10/20     Plan:Telephone follow up appointment with care management team member scheduled for:  11/10/20  [name], RN, BSN, CCM Care Management Coordinator [facility]  Direct Phone: [phone]

## 2020-08-14 NOTE — Patient Instructions (Signed)
Goals Addressed    . Diabetes Mellitus - glycemic management optimized   On track    Timeframe:  Long-Range Goal Priority:  High Start Date: 08/05/20                            Expected End Date:  02/04/21  Next Follow up date: 11/10/20         Patient Self-Care Activities  Self administers oral medications as prescribed Attends all scheduled provider appointments Checks blood sugars as prescribed and utilize hyper and hypoglycemia protocol as needed Adheres to prescribed ADA/carb modified Patient Goal:  - lower A1c                 . COMPLETED: Manage Cholesterol/Triglycerides       Timeframe:  Long-Range Goal Priority:  High Start Date:  03/24/20                           Expected End Date:  06/22/20  Over the next 90 days, patient will:  -Self administers oral medications as prescribed -Attends all scheduled provider appointments -Adheres to prescribed low fat, heart healthy diet  -Review patient educational article "Triglycerides, Why Do They Matter"? (mailed to patient) -Follow up with Vascular Surgeon to further evaluate abnormal peripheral artery study                          . Monitor and Manage My Blood Sugar-Diabetes Type 2   On track    Timeframe:  Long-Range Goal Priority:  High Start Date: 03/24/20                        Expected End Date: 06/22/20                    Follow Up Date 05/06/20    - check blood sugar at prescribed times - check blood sugar before and after exercise - check blood sugar if I feel it is too high or too low - enter blood sugar readings and medication or insulin into daily log - take the blood sugar log to all doctor visits - take the blood sugar meter to all doctor visits    Why is this important?    Checking your blood sugar at home helps to keep it from getting very high or very low.   Writing the results in a diary or log helps the doctor know how to care for you.   Your blood sugar log should have the time, date and the results.    Also, write down the amount of insulin or other medicine that you take.   Other information, like what you ate, exercise done and how you were feeling, will also be helpful.     Notes:     . COMPLETED: Set My Target A1C-Diabetes Type 2       Timeframe:  Long-Range Goal Priority:  High Start Date: 03/24/20                            Expected End Date: 06/22/20                      Follow Up Date  05/06/20   - set target A1C    Why is this important?  Your target A1C is decided together by you and your doctor.   It is based on several things like your age and other health issues.    Notes:     [name] Vitamin D deficiency - improved or resolved       Timeframe:  Long-Range Goal Priority:  High Start Date:  08/05/20                           Expected End Date:  02/04/21  Next Follow up date: 11/10/20        Self Care Activities:  . Continue to keep all scheduled follow up appointments . Take medications as directed  . Let your healthcare team know if you are unable to take your medications . Call your pharmacy for refills at least 7 days prior to running out of medication . Eat a Vitamin D rich diet and get at least 15 minutes of natural sunlight daily when able  Patient Goals: - improve or resolve Vitamin D deficiency

## 2020-08-17 LAB — URINE CULTURE

## 2020-08-17 NOTE — Telephone Encounter (Signed)
She is negative for a urinary tract infection. We can recheck her white count at her next visit make sure it is within 3 months of her last visit

## 2020-08-18 ENCOUNTER — Ambulatory Visit (INDEPENDENT_AMBULATORY_CARE_PROVIDER_SITE_OTHER): Payer: Medicare Other

## 2020-08-18 VITALS — Ht 62.0 in | Wt 208.0 lb

## 2020-08-18 DIAGNOSIS — Z0000 Encounter for general adult medical examination without abnormal findings: Secondary | ICD-10-CM

## 2020-08-18 NOTE — Telephone Encounter (Signed)
Patient notified YL,RMA 

## 2020-08-18 NOTE — Progress Notes (Signed)
I connected with Joanna Sandoval today by telephone and verified that I am speaking with the correct person using two identifiers. Location patient: home Location provider: work Persons participating in the virtual visit: Galvin Proffer LPN.   I discussed the limitations, risks, security and privacy concerns of performing an evaluation and management service by telephone and the availability of in person appointments. I also discussed with the patient that there may be a patient responsible charge related to this service. The patient expressed understanding and verbally consented to this telephonic visit.    Interactive audio and video telecommunications were attempted between this provider and patient, however failed, due to patient having technical difficulties OR patient did not have access to video capability.  We continued and completed visit with audio only.     Vital signs may be patient reported or missing.  Subjective:   Joanna Sandoval is a 75 y.o. female who presents for Medicare Annual (Subsequent) preventive examination.  Review of Systems     Cardiac Risk Factors include: advanced age (>83mn, >>19women);diabetes mellitus;hypertension;obesity (BMI >30kg/m2)     Objective:    Today's Vitals   08/18/20 0912  Weight: 208 lb (94.3 kg)  Height: _0  (1.575 m)   Body mass index is 38.04 kg/m.  Advanced Directives 08/18/2020 10/17/2019 07/30/2019 06/19/2018 05/02/2017  Does Patient Have a Medical Advance Directive? _1   Would patient like information on creating a medical advance directive? - No - Patient declined No - Patient declined Yes (MAU/Ambulatory/Procedural Areas - Information given) Yes (MAU/Ambulatory/Procedural Areas - Information given)    Current Medications (verified) Outpatient Encounter Medications as of 08/18/2020  Medication Sig  . albuterol (PROVENTIL HFA;VENTOLIN HFA) 108 (90 Base) MCG/ACT inhaler Inhale 2 puffs into the lungs  every 6 (six) hours as needed.  .Marland Kitchenatenolol (TENORMIN) 100 MG tablet Take 50 mg by mouth daily.   .Marland Kitchenatorvastatin (LIPITOR) 40 MG tablet Take 1 tablet (40 mg total) by mouth daily. Take 1/2 pill at bedtime  . Blood Glucose Monitoring Suppl (ACCU-CHEK GUIDE) w/Device KIT USE TWICE DAILY AT 10AM AND 5PM  . carboxymethylcellulose (REFRESH PLUS) 0.5 % SOLN 1 drop 3 (three) times daily as needed.  . D3-50 50000 units capsule   . diclofenac sodium (VOLTAREN) 1 % GEL Apply 2 g topically 4 (four) times daily.  .Marland Kitchenglimepiride (AMARYL) 2 MG tablet TAKE 1 TABLET BY MOUTH IN  THE MORNING AND AT BEDTIME  . glucose blood (ACCU-CHEK GUIDE) test strip Use as instructed  . [name] lancets Use as instructed  . [name] KIT Use to test blood sugar twice daily as directed. E11.65  . latanoprost (XALATAN) 0.005 % ophthalmic solution 1 drop at bedtime.  .Marland Kitchenlosartan (COZAAR) 50 MG tablet Take 50 mg by mouth daily.  . Magnesium 200 MG TABS Take 1 tab by mouth daily with evening meal  . RYBELSUS 14 MG TABS TAKE 1 TABLET BY MOUTH  DAILY 1/2 HOUR BEFORE  BREAKFAST  . naproxen (NAPROSYN) 500 MG tablet Take 1 tablet (500 mg total) by mouth 2 (two) times daily. (Patient not taking: Reported on 08/18/2020)   No facility-administered encounter medications on file as of 08/18/2020.    Allergies (verified) Patient has no known allergies.   History: Past Medical History:  Diagnosis Date  . Arthritis   . Diabetes mellitus   . Hypercholesteremia   . Hypertension   . Post-menopausal bleeding    Past Surgical  History:  Procedure Laterality Date  . CATARACT EXTRACTION    . DILATION AND CURETTAGE OF UTERUS    . HYSTEROSCOPY WITH D & C N/A 05/02/2017   Procedure: DILATATION AND CURETTAGE /HYSTEROSCOPY;  Surgeon: Mora Bellman, MD;  Location: [place];  Service: Gynecology;  Laterality: N/A;  . KNEE ARTHROSCOPY     Family History  Problem Relation Age  of Onset  . Hypertension Mother   . Hypertension Father   . Cancer Father        ?lung  . Diabetes Sister    Social History   Socioeconomic History  . Marital status: Divorced    Spouse name: Not on file  . Number of children: Not on file  . Years of education: Not on file  . Highest education level: Not on file  Occupational History  . Not on file  Tobacco Use  . Smoking status: Former Smoker    Types: Cigarettes  . Smokeless tobacco: Never Used  Vaping Use  . Vaping Use: Never used  Substance and Sexual Activity  . Alcohol use: No  . Drug use: No  . Sexual activity: Not Currently    Birth control/protection: Post-menopausal  Other Topics Concern  . Not on file  Social History Narrative  . Not on file   Social Determinants of Health   Financial Resource Strain: Low Risk   . Difficulty of Paying Living Expenses: Not hard at all  Food Insecurity: No Food Insecurity  . Worried About Charity fundraiser in the Last Year: Never true  . Ran Out of Food in the Last Year: Never true  Transportation Needs: No Transportation Needs  . Lack of Transportation (Medical): No  . Lack of Transportation (Non-Medical): No  Physical Activity: Insufficiently Active  . Days of Exercise per Week: 4 days  . Minutes of Exercise per Session: 30 min  Stress: No Stress Concern Present  . Feeling of Stress : Only a little  Social Connections: Not on file    Tobacco Counseling Counseling given: Not Answered   Clinical Intake:  Pre-visit preparation completed: Yes  Pain : No/denies pain     Nutritional Status: BMI > 30  Obese Nutritional Risks: Nausea/ vomitting/ diarrhea (diarrhea two weeks ago) Diabetes: Yes  How often do you need to have someone help you when you read instructions, pamphlets, or other written materials from your doctor or pharmacy?: 1 - Never What is the last grade level you completed in school?: 11th grade  Diabetic? Yes Nutrition Risk Assessment:  Has  the patient had any N/V/D within the last 2 months?  No  Does the patient have any non-healing wounds?  No  Has the patient had any unintentional weight loss or weight gain?  No   Diabetes:  Is the patient diabetic?  Yes  If diabetic, was a CBG obtained today?  No  Did the patient bring in their glucometer from home?  No  How often do you monitor your CBG's? Twice daily.   Financial Strains and Diabetes Management:  Are you having any financial strains with the device, your supplies or your medication? No .  Does the patient want to be seen by Chronic Care Management for management of their diabetes?  No  Would the patient like to be referred to a Nutritionist or for Diabetic Management?  No   Diabetic Exams:  Diabetic Eye Exam: Completed 09/23/2019 Diabetic Foot Exam: Completed 08/04/2020   Interpreter Needed?: No  Information entered  by :: NAllen LPN   Activities of Daily Living In your present state of health, do you have any difficulty performing the following activities: 08/18/2020 08/04/2020  Hearing? Y Y  Comment - Do not want hearing aids  Vision? Y Y  Comment seeing at night with driving -  Difficulty concentrating or making decisions? N N  Walking or climbing stairs? Y Y  Dressing or bathing? N N  Doing errands, shopping? N N  Preparing Food and eating ? N -  Using the Toilet? N -  In the past six months, have you accidently leaked urine? N -  Do you have problems with loss of bowel control? N -  Managing your Medications? N -  Managing your Finances? N -  Housekeeping or managing your Housekeeping? N -  Some recent data might be hidden    Patient Care Team: [name], FNP as PCP - General ([place]) Little, Claudette Stapler, RN as Case Manager  Indicate any recent Medical Services you may have received from other than [facility] providers in the past year (date may be approximate).     Assessment:   This is a routine wellness examination for  [place].  Hearing/Vision screen No exam data present  Dietary issues and exercise activities discussed: Current Exercise Habits: Home exercise routine, Type of exercise: walking, Time (Minutes): 30, Frequency (Times/Week): 4, Weekly Exercise (Minutes/Week): 120  Goals Addressed            This Visit's Progress   . Patient Stated       08/18/2020, wants to weigh 125-130 pounds and retire completely      Depression Screen PHQ 2/9 Scores 08/18/2020 07/07/2020 01/29/2020 10/28/2019 10/28/2019 07/30/2019 05/08/2019  PHQ - 2 Score 0 0 0 0 0 6 0  PHQ- 9 Score - 0 0 0 - 10 -    Fall Risk Fall Risk  08/18/2020 08/04/2020 07/30/2019 05/08/2019 01/30/2019  Falls in the past year? 0 0 0 0 1  Number falls in past yr: - - - - 0  Injury with Fall? - - - - 0  Risk for fall due to : Medication side effect - Medication side effect;Impaired balance/gait - -  Follow up Falls evaluation completed;Education provided;Falls prevention discussed - Falls evaluation completed;Education provided;Falls prevention discussed - -    FALL RISK PREVENTION PERTAINING TO THE HOME:  Any stairs in or around the home? No  If so, are there any without handrails? n/a Home free of loose throw rugs in walkways, pet beds, electrical cords, etc? Yes  Adequate lighting in your home to reduce risk of falls? Yes   ASSISTIVE DEVICES UTILIZED TO PREVENT FALLS:  Life alert? No  Use of a cane, walker or w/c? Yes  Grab bars in the bathroom? Yes  Shower chair or bench in shower? Yes  Elevated toilet seat or a handicapped toilet? No   TIMED UP AND GO:  Was the test performed? No .    Cognitive Function:     6CIT Screen 08/18/2020 07/30/2019 06/19/2018  What Year? 0 points 0 points 0 points  What month? 0 points 0 points 0 points  What time? 3 points 3 points 3 points  Count back from 20 4 points 0 points 0 points  Months in reverse 2 points 4 points 2 points  Repeat phrase 4 points 4 points 0 points  Total Score _0 Immunizations Immunization History  Administered Date(s) Administered  . Fluad Quad(high Dose  65+) 01/29/2020  . Influenza, High Dose Seasonal PF 01/16/2018  . Influenza,inj,Quad PF,6+ Mos 01/12/2019  . PFIZER(Purple Top)SARS-COV-2 Vaccination 05/25/2019, 06/18/2019, 02/14/2020  . Pneumococcal Polysaccharide-23 08/04/2020  . Tdap 10/06/2018    TDAP status: Up to date  Flu Vaccine status: Up to date  Pneumococcal vaccine status: Up to date  Covid-19 vaccine status: Completed vaccines  Qualifies for Shingles Vaccine? Yes   Zostavax completed No   Shingrix Completed?: No.    Education has been provided regarding the importance of this vaccine. Patient has been advised to call insurance company to determine out of pocket expense if they have not yet received this vaccine. Advised may also receive vaccine at local pharmacy or Health Dept. Verbalized acceptance and understanding.  Screening Tests Health Maintenance  Topic Date Due  . COVID-19 Vaccine (4 - Booster for Pfizer series) 08/14/2020  . OPHTHALMOLOGY EXAM  09/22/2020  . INFLUENZA VACCINE  11/15/2020  . HEMOGLOBIN A1C  02/03/2021  . FOOT EXAM  08/04/2021  . MAMMOGRAM  07/30/2022  . COLONOSCOPY (Pts 45-43yr Insurance coverage will need to be confirmed)  02/05/2028  . TETANUS/TDAP  10/05/2028  . DEXA SCAN  Completed  . Hepatitis C Screening  Completed  . PNA vac Low Risk Adult  Completed  . HPV VACCINES  Aged Out    Health Maintenance  Health Maintenance Due  Topic Date Due  . COVID-19 Vaccine (4 - Booster for Pfizer series) 08/14/2020    Colorectal cancer screening: No longer required.   Mammogram status: Completed 07/29/2020. Repeat every year  Bone Density status: Completed 07/16/2012.   Lung Cancer Screening: (Low Dose CT Chest recommended if Age 74-80years, 30 pack-year currently smoking OR have quit w/in 15years.) does not qualify.   Lung Cancer Screening Referral: no  Additional  Screening:  Hepatitis C Screening: does qualify; Completed 10/30/2018  Vision Screening: Recommended annual ophthalmology exams for early detection of glaucoma and other disorders of the eye. Is the patient up to date with their annual eye exam?  Yes  Who is the provider or what is the name of the office in which the patient attends annual eye exams? [facility]  If pt is not established with a provider, would they like to be referred to a provider to establish care? No .   Dental Screening: Recommended annual dental exams for proper oral hygiene  Community Resource Referral / Chronic Care Management: CRR required this visit?  No   CCM required this visit?  No      Plan:     I have personally reviewed and noted the following in the patient's chart:   . Medical and social history . Use of alcohol, tobacco or illicit drugs  . Current medications and supplements including opioid prescriptions.  . Functional ability and status . Nutritional status . Physical activity . Advanced directives . List of other physicians . Hospitalizations, surgeries, and ER visits in previous 12 months . Vitals . Screenings to include cognitive, depression, and falls . Referrals and appointments  In addition, I have reviewed and discussed with patient certain preventive protocols, quality metrics, and best practice recommendations. A written personalized care plan for preventive services as well as general preventive health recommendations were provided to patient.     NKellie Simmering LPN   [id number]  Nurse Notes:

## 2020-08-18 NOTE — Patient Instructions (Signed)
Joanna Sandoval , Thank you for taking time to come for your Medicare Wellness Visit. I appreciate your ongoing commitment to your health goals. Please review the following plan we discussed and let me know if I can assist you in the future.   Screening recommendations/referrals: Colonoscopy: not required Mammogram: completed 07/29/2020 Bone Density: completed 07/16/2012 Recommended yearly ophthalmology/optometry visit for glaucoma screening and checkup Recommended yearly dental visit for hygiene and checkup  Vaccinations: Influenza vaccine: completed 01/29/2020, due 11/15/2020 Pneumococcal vaccine: completed 08/04/2020 Tdap vaccine: completed 10/06/2018 Shingles vaccine: discussed   Covid-19: 02/14/2020, 06/18/2019, 05/25/2019  Advanced directives: Advance directive discussed with you today.   Conditions/risks identified: none  Next appointment: Follow up in one year for your annual wellness visit    Preventive Care 65 Years and Older, Female Preventive care refers to lifestyle choices and visits with your health care provider that can promote health and wellness. What does preventive care include?  A yearly physical exam. This is also called an annual well check.  Dental exams once or twice a year.  Routine eye exams. Ask your health care provider how often you should have your eyes checked.  Personal lifestyle choices, including:  Daily care of your teeth and gums.  Regular physical activity.  Eating a healthy diet.  Avoiding tobacco and drug use.  Limiting alcohol use.  Practicing safe sex.  Taking low-dose aspirin every day.  Taking vitamin and mineral supplements as recommended by your health care provider. What happens during an annual well check? The services and screenings done by your health care provider during your annual well check will depend on your age, overall health, lifestyle risk factors, and family history of disease. Counseling  Your health care provider  may ask you questions about your:  Alcohol use.  Tobacco use.  Drug use.  Emotional well-being.  Home and relationship well-being.  Sexual activity.  Eating habits.  History of falls.  Memory and ability to understand (cognition).  Work and work Statistician.  Reproductive health. Screening  You may have the following tests or measurements:  Height, weight, and BMI.  Blood pressure.  Lipid and cholesterol levels. These may be checked every 5 years, or more frequently if you are over 45 years old.  Skin check.  Lung cancer screening. You may have this screening every year starting at age 60 if you have a 30-pack-year history of smoking and currently smoke or have quit within the past 15 years.  Fecal occult blood test (FOBT) of the stool. You may have this test every year starting at age 56.  Flexible sigmoidoscopy or colonoscopy. You may have a sigmoidoscopy every 5 years or a colonoscopy every 10 years starting at age 69.  Hepatitis C blood test.  Hepatitis B blood test.  Sexually transmitted disease (STD) testing.  Diabetes screening. This is done by checking your blood sugar (glucose) after you have not eaten for a while (fasting). You may have this done every 1-3 years.  Bone density scan. This is done to screen for osteoporosis. You may have this done starting at age 67.  Mammogram. This may be done every 1-2 years. Talk to your health care provider about how often you should have regular mammograms. Talk with your health care provider about your test results, treatment options, and if necessary, the need for more tests. Vaccines  Your health care provider may recommend certain vaccines, such as:  Influenza vaccine. This is recommended every year.  Tetanus, diphtheria, and acellular pertussis (Tdap,  Td) vaccine. You may need a Td booster every 10 years.  Zoster vaccine. You may need this after age 48.  Pneumococcal 13-valent conjugate (PCV13) vaccine.  One dose is recommended after age 49.  Pneumococcal polysaccharide (PPSV23) vaccine. One dose is recommended after age 22. Talk to your health care provider about which screenings and vaccines you need and how often you need them. This information is not intended to replace advice given to you by your health care provider. Make sure you discuss any questions you have with your health care provider. Document Released: 04/30/2015 Document Revised: 12/22/2015 Document Reviewed: 02/02/2015 Elsevier Interactive Patient Education  2017 [place] Prevention in the Home Falls can cause injuries. They can happen to people of all ages. There are many things you can do to make your home safe and to help prevent falls. What can I do on the outside of my home?  Regularly fix the edges of walkways and driveways and fix any cracks.  Remove anything that might make you trip as you walk through a door, such as a raised step or threshold.  Trim any bushes or trees on the path to your home.  Use bright outdoor lighting.  Clear any walking paths of anything that might make someone trip, such as rocks or tools.  Regularly check to see if handrails are loose or broken. Make sure that both sides of any steps have handrails.  Any raised decks and porches should have guardrails on the edges.  Have any leaves, snow, or ice cleared regularly.  Use sand or salt on walking paths during winter.  Clean up any spills in your garage right away. This includes oil or grease spills. What can I do in the bathroom?  Use night lights.  Install grab bars by the toilet and in the tub and shower. Do not use towel bars as grab bars.  Use non-skid mats or decals in the tub or shower.  If you need to sit down in the shower, use a plastic, non-slip stool.  Keep the floor dry. Clean up any water that spills on the floor as soon as it happens.  Remove soap buildup in the tub or shower regularly.  Attach bath  mats securely with double-sided non-slip rug tape.  Do not have throw rugs and other things on the floor that can make you trip. What can I do in the bedroom?  Use night lights.  Make sure that you have a light by your bed that is easy to reach.  Do not use any sheets or blankets that are too big for your bed. They should not hang down onto the floor.  Have a firm chair that has side arms. You can use this for support while you get dressed.  Do not have throw rugs and other things on the floor that can make you trip. What can I do in the kitchen?  Clean up any spills right away.  Avoid walking on wet floors.  Keep items that you use a lot in easy-to-reach places.  If you need to reach something above you, use a strong step stool that has a grab bar.  Keep electrical cords out of the way.  Do not use floor polish or wax that makes floors slippery. If you must use wax, use non-skid floor wax.  Do not have throw rugs and other things on the floor that can make you trip. What can I do with my stairs?  Do not  leave any items on the stairs.  Make sure that there are handrails on both sides of the stairs and use them. Fix handrails that are broken or loose. Make sure that handrails are as long as the stairways.  Check any carpeting to make sure that it is firmly attached to the stairs. Fix any carpet that is loose or worn.  Avoid having throw rugs at the top or bottom of the stairs. If you do have throw rugs, attach them to the floor with carpet tape.  Make sure that you have a light switch at the top of the stairs and the bottom of the stairs. If you do not have them, ask someone to add them for you. What else can I do to help prevent falls?  Wear shoes that:  Do not have high heels.  Have rubber bottoms.  Are comfortable and fit you well.  Are closed at the toe. Do not wear sandals.  If you use a stepladder:  Make sure that it is fully opened. Do not climb a closed  stepladder.  Make sure that both sides of the stepladder are locked into place.  Ask someone to hold it for you, if possible.  Clearly mark and make sure that you can see:  Any grab bars or handrails.  First and last steps.  Where the edge of each step is.  Use tools that help you move around (mobility aids) if they are needed. These include:  Canes.  Walkers.  Scooters.  Crutches.  Turn on the lights when you go into a dark area. Replace any light bulbs as soon as they burn out.  Set up your furniture so you have a clear path. Avoid moving your furniture around.  If any of your floors are uneven, fix them.  If there are any pets around you, be aware of where they are.  Review your medicines with your doctor. Some medicines can make you feel dizzy. This can increase your chance of falling. Ask your doctor what other things that you can do to help prevent falls. This information is not intended to replace advice given to you by your health care provider. Make sure you discuss any questions you have with your health care provider. Document Released: 01/28/2009 Document Revised: 09/09/2015 Document Reviewed: 05/08/2014 Elsevier Interactive Patient Education  2017 [employer].

## 2020-08-19 ENCOUNTER — Ambulatory Visit: Payer: Medicare Other

## 2020-08-24 ENCOUNTER — Ambulatory Visit: Payer: Medicare Other

## 2020-08-24 ENCOUNTER — Other Ambulatory Visit: Payer: Self-pay

## 2020-08-24 VITALS — BP 130/78 | HR 93 | Temp 98.1°F | Ht 62.0 in | Wt 207.8 lb

## 2020-08-24 DIAGNOSIS — Z111 Encounter for screening for respiratory tuberculosis: Secondary | ICD-10-CM

## 2020-08-24 NOTE — Progress Notes (Signed)
Pt is here today for a TB skin test.

## 2020-08-27 LAB — QUANTIFERON-TB GOLD PLUS
QuantiFERON Mitogen Value: >10 IU/mL
QuantiFERON Nil Value: 0.04 IU/mL
QuantiFERON TB1 Ag Value: 0.04 IU/mL
QuantiFERON TB2 Ag Value: 0.05 IU/mL
QuantiFERON-TB Gold Plus: NEGATIVE

## 2020-10-12 DIAGNOSIS — H1045 Other chronic allergic conjunctivitis: Secondary | ICD-10-CM | POA: Diagnosis not present

## 2020-10-12 DIAGNOSIS — Q141 Congenital malformation of retina: Secondary | ICD-10-CM | POA: Diagnosis not present

## 2020-10-12 DIAGNOSIS — Z961 Presence of intraocular lens: Secondary | ICD-10-CM | POA: Diagnosis not present

## 2020-10-12 DIAGNOSIS — E119 Type 2 diabetes mellitus without complications: Secondary | ICD-10-CM | POA: Diagnosis not present

## 2020-10-12 DIAGNOSIS — H401131 Primary open-angle glaucoma, bilateral, mild stage: Secondary | ICD-10-CM | POA: Diagnosis not present

## 2020-10-12 DIAGNOSIS — H02834 Dermatochalasis of left upper eyelid: Secondary | ICD-10-CM | POA: Diagnosis not present

## 2020-10-12 DIAGNOSIS — H02831 Dermatochalasis of right upper eyelid: Secondary | ICD-10-CM | POA: Diagnosis not present

## 2020-10-12 DIAGNOSIS — H16223 Keratoconjunctivitis sicca, not specified as Sjogren's, bilateral: Secondary | ICD-10-CM | POA: Diagnosis not present

## 2020-10-12 LAB — HM DIABETES EYE EXAM

## 2020-10-25 DIAGNOSIS — I10 Essential (primary) hypertension: Secondary | ICD-10-CM | POA: Diagnosis not present

## 2020-10-25 DIAGNOSIS — E119 Type 2 diabetes mellitus without complications: Secondary | ICD-10-CM | POA: Diagnosis not present

## 2020-10-25 DIAGNOSIS — E785 Hyperlipidemia, unspecified: Secondary | ICD-10-CM | POA: Diagnosis not present

## 2020-10-25 DIAGNOSIS — E559 Vitamin D deficiency, unspecified: Secondary | ICD-10-CM | POA: Diagnosis not present

## 2020-11-01 IMAGING — MG DIGITAL SCREENING BILATERAL MAMMOGRAM WITH TOMO AND CAD
6 of 10 series · 6 of 30 positions shown · non-contrast
Comparison: Previous exam(s).

CLINICAL DATA: Screening.

EXAM:
DIGITAL SCREENING BILATERAL MAMMOGRAM WITH TOMO AND CAD

[L MLO synth-2D (1 of 2)]
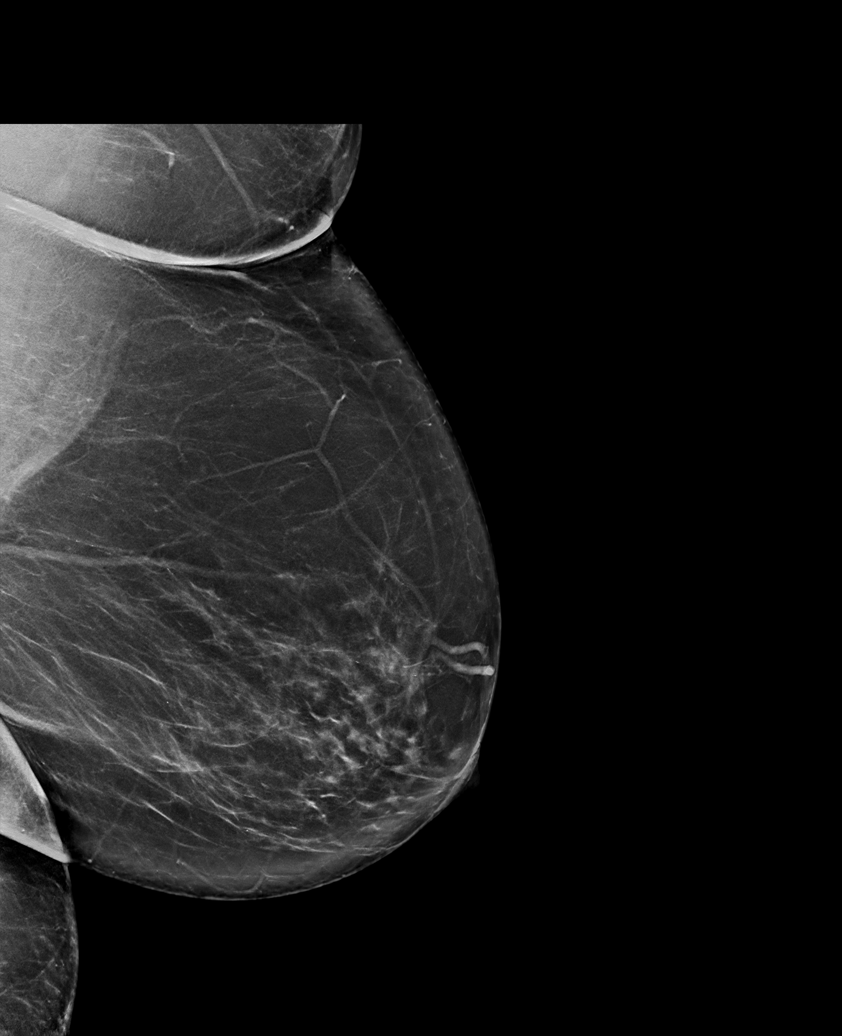

[R MLO synth-2D]
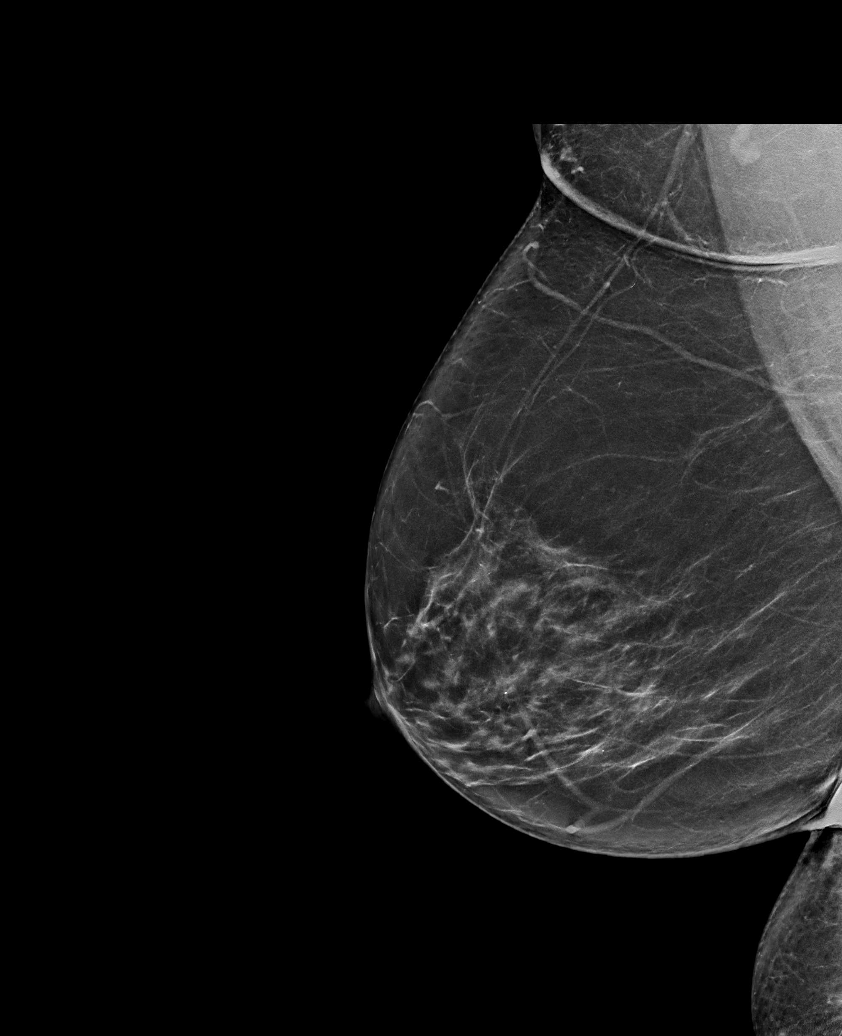

[L MLO synth-2D (2 of 2)]
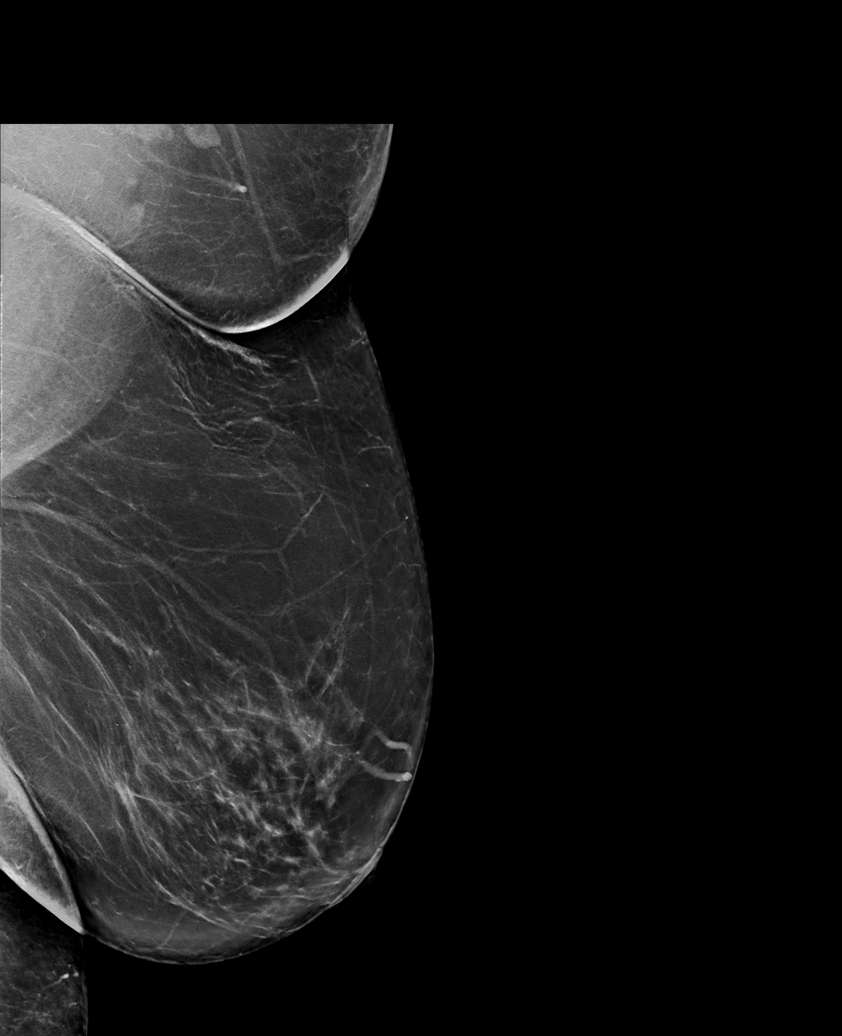

[R CC synth-2D]
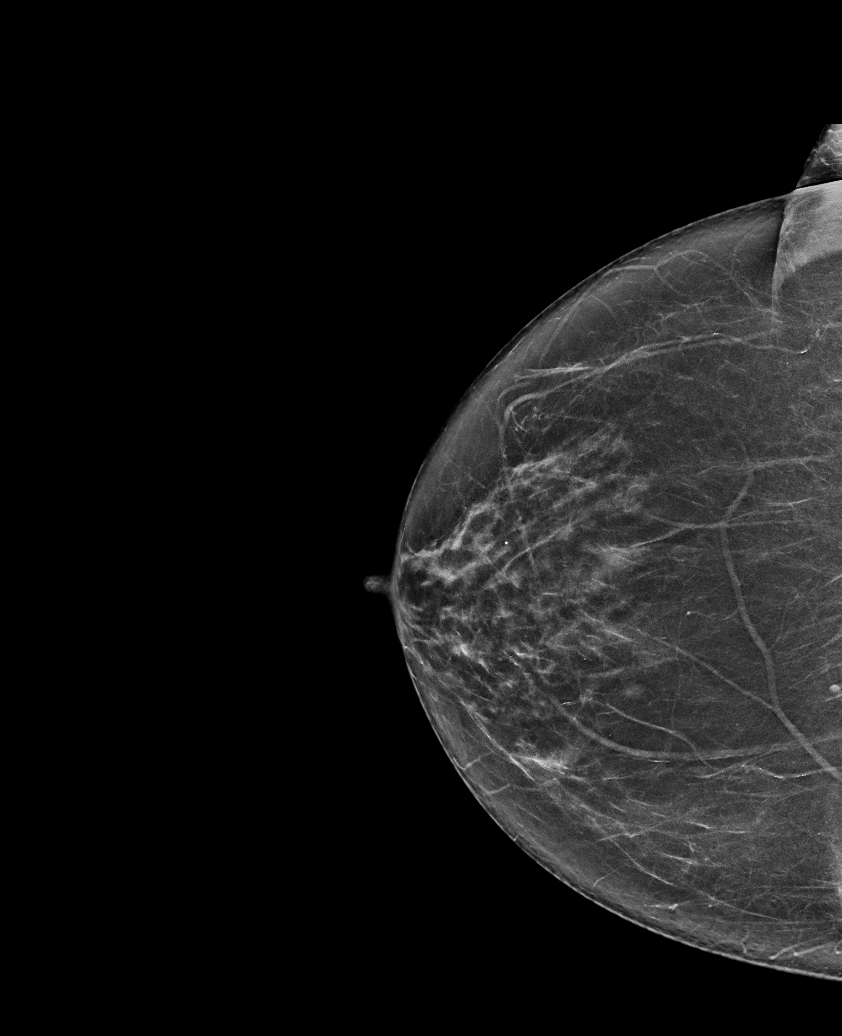

[L CC synth-2D]
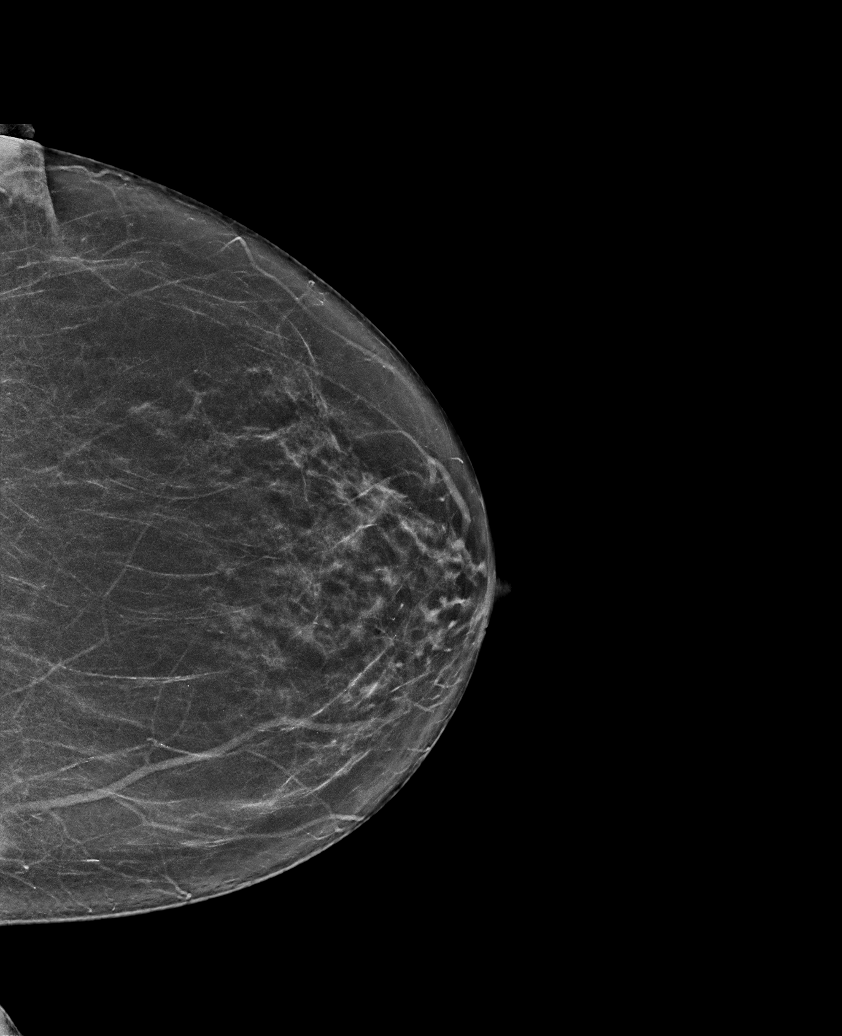

[R MLO tomo · tomo slice 39/77.0]
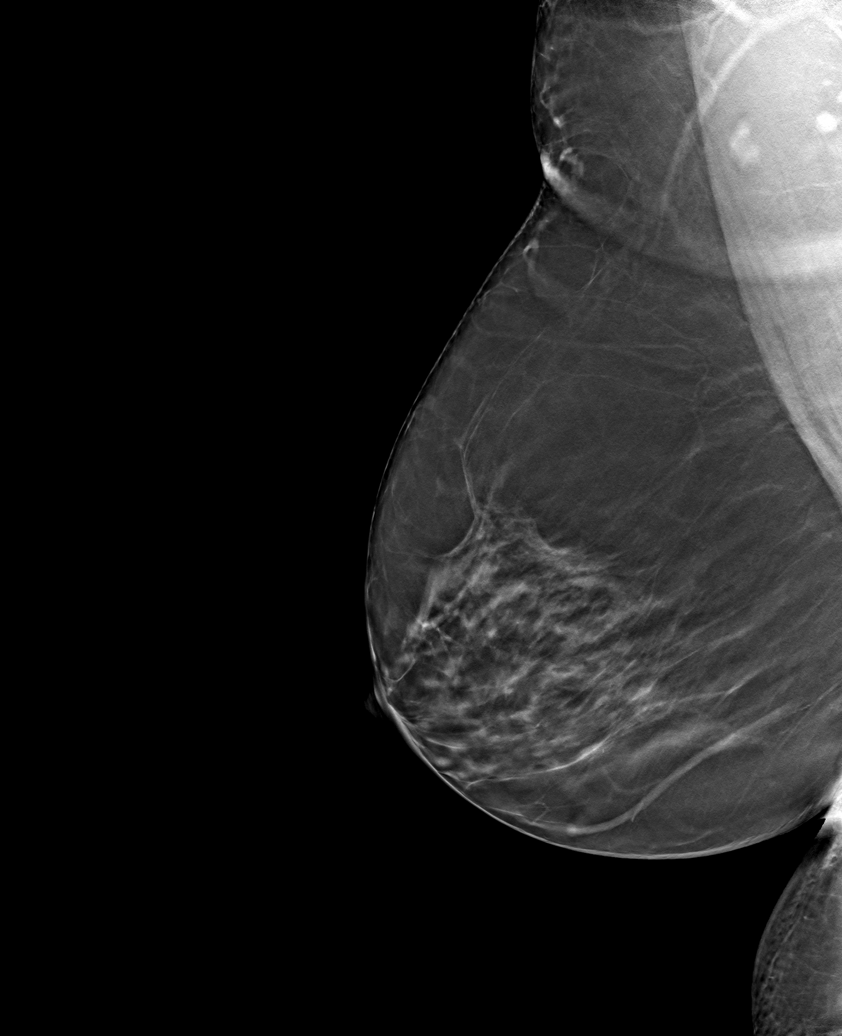

[6 of 30 positions shown; findings below may reference images not displayed]

ACR Breast Density Category b: There are scattered areas of
fibroglandular density.
FINDINGS: There are no findings suspicious for malignancy. Images were
processed with CAD.
IMPRESSION: No mammographic evidence of malignancy. A result letter of this
screening mammogram will be mailed directly to the patient.

RECOMMENDATION:
Screening mammogram in one year. (Code:[id number])

BI-RADS CATEGORY  1: Negative.

## 2020-11-08 ENCOUNTER — Other Ambulatory Visit: Payer: Self-pay

## 2020-11-08 ENCOUNTER — Ambulatory Visit (INDEPENDENT_AMBULATORY_CARE_PROVIDER_SITE_OTHER): Payer: Medicare Other | Admitting: Nurse Practitioner

## 2020-11-08 ENCOUNTER — Encounter: Payer: Self-pay | Admitting: Nurse Practitioner

## 2020-11-08 VITALS — BP 134/80 | HR 102 | Temp 98.2°F | Ht 62.0 in | Wt 206.0 lb

## 2020-11-08 DIAGNOSIS — Z23 Encounter for immunization: Secondary | ICD-10-CM

## 2020-11-08 DIAGNOSIS — E6609 Other obesity due to excess calories: Secondary | ICD-10-CM

## 2020-11-08 DIAGNOSIS — I10 Essential (primary) hypertension: Secondary | ICD-10-CM | POA: Diagnosis not present

## 2020-11-08 DIAGNOSIS — E559 Vitamin D deficiency, unspecified: Secondary | ICD-10-CM

## 2020-11-08 DIAGNOSIS — Z6837 Body mass index (BMI) 37.0-37.9, adult: Secondary | ICD-10-CM

## 2020-11-08 DIAGNOSIS — R1033 Periumbilical pain: Secondary | ICD-10-CM

## 2020-11-08 DIAGNOSIS — E119 Type 2 diabetes mellitus without complications: Secondary | ICD-10-CM

## 2020-11-08 MED ORDER — RYBELSUS 7 MG PO TABS: 1.0000 | ORAL_TABLET | Freq: Every day | ORAL | 1 refills | Status: DC

## 2020-11-08 MED ORDER — SHINGRIX 50 MCG/0.5ML IM SUSR: 0.5000 mL | Freq: Once | INTRAMUSCULAR | 0 refills | Status: AC

## 2020-11-08 NOTE — Progress Notes (Signed)
I,Joanna Sandoval as a Education administrator for [employer], FNP.,have documented all relevant documentation on the behalf of Joanna Brine, FNP,as directed by  Joanna Brine, FNP while in the presence of Joanna Sandoval, [place].  This visit occurred during the SARS-CoV-2 public health emergency.  Safety protocols were in place, including screening questions prior to the visit, additional usage of staff PPE, and extensive cleaning of exam room while observing appropriate contact time as indicated for disinfecting solutions.  Subjective:     Patient ID: Joanna Sandoval , female    DOB: 12-Apr-1946 , 75 y.o.   MRN: [id number]   Chief Complaint  Patient presents with   Diabetes   Hypertension     HPI  Patient is here today for diabetes and hypertension check. Patient also complains of stomach pain. She is having regular bowel movements. She feels like she is bloated.  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 She feels like being at home she is eating more and not walking.   She seen Dr. Terrence Dupont two weeks ago and her blood pressure was up. She is taking her medication at 10 am daily, she did not have any changes by him.   Wt Readings from Last 3 Encounters: 08/24/20 : 207 lb 12.8 oz (94.3 kg) 08/18/20 : 208 lb (94.3 kg) 08/04/20 : 203 lb 6.4 oz (92.3 kg)    Diabetes She presents for her follow-up diabetic visit. She has type 2 diabetes mellitus. Her disease course has been worsening. Pertinent negatives for hypoglycemia include no dizziness or headaches. Pertinent negatives for diabetes include no chest pain, no fatigue, no polydipsia, no polyphagia and no polyuria. Symptoms are resolved. There are no diabetic complications. Risk factors for coronary artery disease include obesity, hypertension, diabetes mellitus, dyslipidemia, sedentary lifestyle and post-menopausal. Current diabetic treatment includes oral agent (dual therapy). She is compliant with treatment most of  the time. Her weight is stable. She is following a generally unhealthy diet. When asked about meal planning, she reported none. She has not had a previous visit with a dietitian. She participates in exercise every other day. Her home blood glucose trend is decreasing steadily. (Her granddaughter has been going shopping with her. ) She does not see a podiatrist.Eye exam is current (done 09/22/2019 neg retinopathy).  Hypertension This is a chronic problem. The current episode started more than 1 year ago. The problem has been gradually improving since onset. The problem is controlled. Pertinent negatives include no chest pain, headaches or palpitations. There are no associated agents to hypertension. Risk factors for coronary artery disease include obesity, post-menopausal state, sedentary lifestyle and diabetes mellitus. Compliance problems include exercise.  There is no history of angina or kidney disease. There is no history of chronic renal disease.    Past Medical History:  Diagnosis Date   Arthritis    Diabetes mellitus    Hypercholesteremia    Hypertension    Post-menopausal bleeding    Postmenopausal bleeding 08/30/2011   Normal EBX 09/2011.     Family History  Problem Relation Age of Onset   Hypertension Mother    Hypertension Father    Cancer Father        ?lung   Diabetes Sister      Current Outpatient Medications:    albuterol (PROVENTIL HFA;VENTOLIN HFA) 108 (90 Base) MCG/ACT inhaler, Inhale 2 puffs into the lungs every 6 (six) hours as needed., Disp: , Rfl:    atenolol (  TENORMIN) 100 MG tablet, Take 50 mg by mouth daily. , Disp: , Rfl:    atorvastatin (LIPITOR) 40 MG tablet, Take 1 tablet (40 mg total) by mouth daily. Take 1/2 pill at bedtime, Disp: 90 tablet, Rfl: 1   Blood Glucose Monitoring Suppl (ACCU-CHEK GUIDE) w/Device KIT, USE TWICE DAILY AT 10AM AND 5PM, Disp: 1 kit, Rfl: 1   carboxymethylcellulose (REFRESH PLUS) 0.5 % SOLN, 1 drop 3 (three) times daily as needed.,  Disp: , Rfl:    D3-50 50000 units capsule, , Disp: , Rfl: 0   diclofenac sodium (VOLTAREN) 1 % GEL, Apply 2 g topically 4 (four) times daily., Disp: 100 g, Rfl: 1   glimepiride (AMARYL) 2 MG tablet, TAKE 1 TABLET BY MOUTH IN  THE MORNING AND AT BEDTIME, Disp: 180 tablet, Rfl: 3   glucose blood (ACCU-CHEK GUIDE) test strip, Use as instructed, Disp: 100 each, Rfl: 12   [name] lancets, Use as instructed, Disp: 100 each, Rfl: 12   [name] KIT, Use to test blood sugar twice daily as directed. E11.65, Disp: 1 kit, Rfl: 12   latanoprost (XALATAN) 0.005 % ophthalmic solution, 1 drop at bedtime., Disp: , Rfl:    losartan (COZAAR) 50 MG tablet, Take 50 mg by mouth daily., Disp: , Rfl:    Magnesium 200 MG TABS, Take 1 tab by mouth daily with evening meal, Disp: 30 tablet, Rfl: 2   naproxen (NAPROSYN) 500 MG tablet, Take 1 tablet (500 mg total) by mouth 2 (two) times daily., Disp: 30 tablet, Rfl: 0   Semaglutide (RYBELSUS) 7 MG TABS, Take 1 tablet by mouth daily., Disp: 90 tablet, Rfl: 1   Zoster Vaccine Adjuvanted (SHINGRIX) injection, Inject 0.5 mLs into the muscle once for 1 dose., Disp: 0.5 mL, Rfl: 0   No Known Allergies   Review of Systems  Constitutional: Negative.  Negative for fatigue.  HENT: Negative.    Eyes: Negative.   Respiratory: Negative.    Cardiovascular: Negative.  Negative for chest pain, palpitations and leg swelling.  Gastrointestinal:  Positive for abdominal pain (mid umbilicus sharp pain 4/53). Negative for abdominal distention, constipation, diarrhea, nausea and vomiting.  Endocrine: Negative for polydipsia, polyphagia and polyuria.  Musculoskeletal: Negative.   Skin: Negative.   Neurological:  Negative for dizziness and headaches.  Psychiatric/Behavioral: Negative.      Today's Vitals   11/08/20 0841  BP: 134/80  Pulse: (!) 102  Temp: 98.2 F (36.8 C)  Weight: 206 lb (93.4 kg)  Height: $Remove'5\' 2"'diogTMp$  (1.575 m)   PainSc: 8   PainLoc: Abdomen   Body mass index is 37.68 kg/m.   Objective:  Physical Exam Vitals reviewed.  Constitutional:      General: She is not in acute distress.    Appearance: Normal appearance. She is obese.  HENT:     Head: Normocephalic.     Nose:     Comments: Deferred - masked    Mouth/Throat:     Comments: Deferred - masked Eyes:     Extraocular Movements: Extraocular movements intact.     Conjunctiva/sclera: Conjunctivae normal.     Pupils: Pupils are equal, round, and reactive to light.  Cardiovascular:     Rate and Rhythm: Normal rate and regular rhythm.     Pulses: Normal pulses.     Heart sounds: Normal heart sounds. No murmur heard. Pulmonary:     Effort: Pulmonary effort is normal. No respiratory distress.     Breath sounds:  Normal breath sounds. No wheezing.  Abdominal:     General: Abdomen is flat. Bowel sounds are normal. There is no distension.     Palpations: Abdomen is soft. There is no mass.     Tenderness: There is no abdominal tenderness.  Musculoskeletal:     Comments: Using a cane for support  Skin:    General: Skin is warm and dry.     Capillary Refill: Capillary refill takes less than 2 seconds.  Neurological:     General: No focal deficit present.     Mental Status: She is alert and oriented to person, place, and time.     Cranial Nerves: No cranial nerve deficit.  Psychiatric:        Mood and Affect: Mood normal.        Behavior: Behavior normal.        Thought Content: Thought content normal.        Judgment: Judgment normal.        Assessment And Plan:     1. Type 2 diabetes mellitus without complication, without long-term current use of insulin (HCC) Comments: decreasing rybelsus to 7 mg due to nausea Will request records from Dr. Katy Fitch for her diabetic eye exam - Semaglutide (RYBELSUS) 7 MG TABS; Take 1 tablet by mouth daily.  Dispense: 90 tablet; Refill: 1 - CMP14+EGFR - Lipid panel - Hemoglobin A1c  2. Essential  hypertension Comments: Stable, fair control Continue follow up with Dr. Terrence Dupont - CMP14+EGFR  3. Vitamin D deficiency Will check vitamin D level and supplement as needed.    Also encouraged to spend 15 minutes in the sun daily.  - VITAMIN D 25 Hydroxy (Vit-D Deficiency, Fractures)  4. Periumbilical abdominal pain Comments: No tenderness on palpation will send for KUB Also checking Lipase and Amylase - Amylase - Lipase - DG Abd 1 View; Future  5. Class 2 obesity due to excess calories with body mass index (BMI) of 37.0 to 37.9 in adult, unspecified whether serious comorbidity present Chronic Discussed healthy diet and regular exercise options  Encouraged to exercise at least 150 minutes per week with 2 days of strength training  6. Encounter for immunization Rx sent to pharmacy - Zoster Vaccine Adjuvanted [facility]) injection; Inject 0.5 mLs into the muscle once for 1 dose.  Dispense: 0.5 mL; Refill: 0     Patient was given opportunity to ask questions. Patient verbalized understanding of the plan and was able to repeat key elements of the plan. All questions were answered to their satisfaction.  Joanna Brine, FNP   I, Joanna Brine, FNP, have reviewed all documentation for this visit. The documentation on 11/08/20 for the exam, diagnosis, procedures, and orders are all accurate and complete.   IF YOU HAVE BEEN REFERRED TO A SPECIALIST, IT MAY TAKE 1-2 WEEKS TO SCHEDULE/PROCESS THE REFERRAL. IF YOU HAVE NOT HEARD FROM US/SPECIALIST IN TWO WEEKS, PLEASE GIVE [place] A CALL AT [phone].   THE PATIENT IS ENCOURAGED TO PRACTICE SOCIAL DISTANCING DUE TO THE COVID-19 PANDEMIC.

## 2020-11-08 NOTE — Patient Instructions (Signed)

## 2020-11-09 LAB — CMP14+EGFR
ALT: 10 IU/L (ref 0–32)
AST: 13 IU/L (ref 0–40)
Albumin/Globulin Ratio: 1.7 (ref 1.2–2.2)
Albumin: 4.3 g/dL (ref 3.7–4.7)
Alkaline Phosphatase: 144 IU/L — ABNORMAL HIGH (ref 44–121)
BUN/Creatinine Ratio: 16 (ref 12–28)
BUN: 14 mg/dL (ref 8–27)
Bilirubin Total: 0.7 mg/dL (ref 0.0–1.2)
CO2: 25 mmol/L (ref 20–29)
Calcium: 9.3 mg/dL (ref 8.7–10.3)
Chloride: 104 mmol/L (ref 96–106)
Creatinine, Ser: 0.88 mg/dL (ref 0.57–1.00)
Globulin, Total: 2.6 g/dL (ref 1.5–4.5)
Glucose: 115 mg/dL — ABNORMAL HIGH (ref 65–99)
Potassium: 4.5 mmol/L (ref 3.5–5.2)
Sodium: 146 mmol/L — ABNORMAL HIGH (ref 134–144)
Total Protein: 6.9 g/dL (ref 6.0–8.5)
eGFR: 69 mL/min/{1.73_m2} (ref >59)

## 2020-11-09 LAB — HEMOGLOBIN A1C
Est. average glucose Bld gHb Est-mCnc: 166 mg/dL
Hgb A1c MFr Bld: 7.4 % — ABNORMAL HIGH (ref 4.8–5.6)

## 2020-11-09 LAB — LIPID PANEL
Chol/HDL Ratio: 3.1 ratio (ref 0.0–4.4)
Cholesterol, Total: 148 mg/dL (ref 100–199)
HDL: 48 mg/dL (ref >39)
LDL Chol Calc (NIH): 78 mg/dL (ref 0–99)
Triglycerides: 122 mg/dL (ref 0–149)
VLDL Cholesterol Cal: 22 mg/dL (ref 5–40)

## 2020-11-09 LAB — VITAMIN D 25 HYDROXY (VIT D DEFICIENCY, FRACTURES): Vit D, 25-Hydroxy: 41.2 ng/mL (ref 30.0–100.0)

## 2020-11-09 LAB — LIPASE: Lipase: 39 U/L (ref 14–85)

## 2020-11-09 LAB — AMYLASE: Amylase: 98 U/L (ref 31–110)

## 2020-11-10 ENCOUNTER — Ambulatory Visit (INDEPENDENT_AMBULATORY_CARE_PROVIDER_SITE_OTHER): Payer: Medicare Other

## 2020-11-10 ENCOUNTER — Telehealth: Payer: Medicare Other

## 2020-11-10 DIAGNOSIS — I10 Essential (primary) hypertension: Secondary | ICD-10-CM

## 2020-11-10 DIAGNOSIS — E119 Type 2 diabetes mellitus without complications: Secondary | ICD-10-CM

## 2020-11-10 DIAGNOSIS — E559 Vitamin D deficiency, unspecified: Secondary | ICD-10-CM

## 2020-11-12 ENCOUNTER — Other Ambulatory Visit: Payer: Self-pay | Admitting: Nurse Practitioner

## 2020-11-15 ENCOUNTER — Encounter: Payer: Self-pay | Admitting: Nurse Practitioner

## 2020-11-16 NOTE — Chronic Care Management (AMB) (Signed)
Chronic Care Management   CCM RN Visit Note  11/10/2020 Name: Joanna Sandoval MRN: [id number] DOB: 1945-04-27  Subjective: Joanna Sandoval is a 75 y.o. year old female who is a primary care patient of [name], [place]. The care management team was consulted for assistance with disease management and care coordination needs.    Engaged with patient by telephone for follow up visit in response to provider referral for case management and/or care coordination services.   Consent to Services:  The patient was given information about Chronic Care Management services, agreed to services, and gave verbal consent prior to initiation of services.  Please see initial visit note for detailed documentation.   Patient agreed to services and verbal consent obtained.   Assessment: Review of patient past medical history, allergies, medications, health status, including review of consultants reports, laboratory and other test data, was performed as part of comprehensive evaluation and provision of chronic care management services.   SDOH (Social Determinants of Health) assessments and interventions performed:  Yes, no acute challenges   CCM Care Plan  No Known Allergies  Outpatient Encounter Medications as of 11/10/2020  Medication Sig   albuterol (PROVENTIL HFA;VENTOLIN HFA) 108 (90 Base) MCG/ACT inhaler Inhale 2 puffs into the lungs every 6 (six) hours as needed.   atenolol (TENORMIN) 100 MG tablet Take 50 mg by mouth daily.    atorvastatin (LIPITOR) 40 MG tablet Take 1 tablet (40 mg total) by mouth daily. Take 1/2 pill at bedtime   Blood Glucose Monitoring Suppl (ACCU-CHEK GUIDE) w/Device KIT USE TWICE DAILY AT 10AM AND 5PM   carboxymethylcellulose (REFRESH PLUS) 0.5 % SOLN 1 drop 3 (three) times daily as needed.   D3-50 50000 units capsule    diclofenac sodium (VOLTAREN) 1 % GEL Apply 2 g topically 4 (four) times daily.   glimepiride (AMARYL) 2 MG tablet TAKE 1 TABLET BY MOUTH IN  THE MORNING  AND AT BEDTIME   glucose blood (ACCU-CHEK GUIDE) test strip Use as instructed   [name] KIT Use to test blood sugar twice daily as directed. E11.65   latanoprost (XALATAN) 0.005 % ophthalmic solution 1 drop at bedtime.   losartan (COZAAR) 50 MG tablet Take 50 mg by mouth daily.   Magnesium 200 MG TABS Take 1 tab by mouth daily with evening meal   naproxen (NAPROSYN) 500 MG tablet Take 1 tablet (500 mg total) by mouth 2 (two) times daily.   Semaglutide (RYBELSUS) 7 MG TABS Take 1 tablet by mouth daily.   [DISCONTINUED] [name] lancets Use as instructed   No facility-administered encounter medications on file as of 11/10/2020.    Patient Active Problem List   Diagnosis Date Noted   Colon cancer screening 05/05/2020   Morbid obesity ([place]) 05/05/2020   Type 2 diabetes mellitus without complication, without long-term current use of insulin ([place]) 07/31/2018   Essential hypertension 04/30/2018   Fibroid 11/11/2011    Conditions to be addressed/monitored: Type 2 diabetes mellitus, Essential HTN, Vitamin D deficiency   Care Plan : Diabetes Type 2 (Adult)  Updates made by [name], RN since 11/10/2020 12:00 AM     Problem: Glycemic Management (Diabetes, Type 2)   Priority: High     Long-Range Goal: Glycemic Management Optimized   Start Date: 08/05/2020  Expected End Date: 08/05/2021  Recent Progress: On track  Priority: High  Note:   Objective:  Lab Results  Component Value Date   HGBA1C 7.4 (H) 11/08/2020  Lab Results  Component Value Date   CREATININE 0.88 11/08/2020   CREATININE 0.81 08/04/2020   CREATININE 0.94 01/29/2020   Lab Results  Component Value Date   EGFR 69 11/08/2020   Current Barriers:  Knowledge Deficits related to basic Diabetes pathophysiology and self care/management Knowledge Deficits related to medications used for management of diabetes Case Manager Clinical Goal(s):  Patient will  demonstrate improved adherence to prescribed treatment plan for diabetes self care/management as evidenced by:  daily monitoring and recording of CBG  adherence to ADA/ carb modified diet exercise 3-5 days/week adherence to prescribed medication regimen Interventions:  11/10/20 completed successful outbound call with patient  Collaboration with [name] FNP regarding development and update of comprehensive plan of care as evidenced by provider attestation and co-signature Inter-disciplinary care team collaboration (see longitudinal plan of care) Provided education to patient about basic DM disease process Review of patient status, including review of consultants reports, relevant laboratory and other test results, and medications completed. Re-Educated patient on dietary and exercise recommendations; daily glycemic control FBS 80-130, <180 after meals;15'15' rule Reviewed medications with patient and discussed importance of medication adherence Advised patient, providing education and rationale, to check cbg before meals and at bedtime and record, calling the CCM team and or PCP for findings outside established parameters.  Reviewed and discussed complaints of mid abdominal pain with noted elevated Alkaline Phosphatase Determined patient reported symptoms to PCP and will undergo DG view of her mid abdomen for further evaluation  Mailed printed educational materials related to Diabetes Management  Discussed plans with patient for ongoing care management follow up and provided patient with direct contact information for care management team Patient Self-Care Activities  Self administers oral medications as prescribed Attends all scheduled provider appointments Checks blood sugars as prescribed and utilize hyper and hypoglycemia protocol as needed Adheres to prescribed ADA/carb modified Patient Goal:  - lower A1c Follow Up Plan: Telephone follow up appointment with care management team  member scheduled for: 02/16/21     Care Plan : Vitamin D deficiency  Updates made by [name], RN since 11/10/2020 12:00 AM     Problem: Vitamin D deficiency   Priority: High     Long-Range Goal: Vitamin D deficinecy improved or resolved   Start Date: 08/05/2020  Expected End Date: 08/05/2021  Recent Progress: On track  Priority: High  Note:   Current Barriers:  Ineffective Self Health Maintenance  Clinical Goal(s):  Collaboration with [name], FNP regarding development and update of comprehensive plan of care as evidenced by provider attestation and co-signature Inter-disciplinary care team collaboration (see longitudinal plan of care) patient will work with care management team to address care coordination and chronic disease management needs related to Disease Management Educational Needs Care Coordination Medication Management and Education Psychosocial Support   Interventions:  11/10/20 completed successful outbound call with patient  Evaluation of current treatment plan related to  Vitamin D deficiency , self-management and patient's adherence to plan as established by provider. Collaboration with [name], FNP regarding development and update of comprehensive plan of care as evidenced by provider attestation       and co-signature Inter-disciplinary care team collaboration (see longitudinal plan of care) Provided education to patient about basic disease process related to Vitamin D deficiency Review of patient status, including review of consultants reports, relevant laboratory and other test results, and medications completed. Reviewed medications with patient and discussed importance of medication adherence Educated on dietary recommendations, get at least 15  minutes of natural sunlight when possible  Reviewed scheduled/upcoming provider appointments including: next PCP follow up appointment scheduled for  Discussed plans with patient for ongoing care  management follow up and provided patient with direct contact information for care management team Self Care Activities:  Continue to keep all scheduled follow up appointments Take medications as directed  Let your healthcare team know if you are unable to take your medications Call your pharmacy for refills at least 7 days prior to running out of medication Patient Goals: Eat a Vitamin D rich diet and get at least 15 minutes of natural sunlight daily when possible   Follow Up Plan: Telephone follow up appointment with care management team member scheduled for: 02/16/21    Plan:Telephone follow up appointment with care management team member scheduled for:  02/16/21  [name], RN, BSN, CCM Care Management Coordinator [place] [facility]  Direct Phone: [phone]

## 2020-11-16 NOTE — Patient Instructions (Signed)
Goals Addressed      Diabetes Mellitus - glycemic management optimized   On track    Timeframe:  Long-Range Goal Priority:  High Start Date: 08/05/20                            Expected End Date: 08/05/21  Next Follow up date: 02/16/21       Patient Self-Care Activities  Self administers oral medications as prescribed Attends all scheduled provider appointments Checks blood sugars as prescribed and utilize hyper and hypoglycemia protocol as needed Adheres to prescribed ADA/carb modified Patient Goal:  - lower A1c                  Vitamin D deficiency - improved or resolved   On track    Timeframe:  Long-Range Goal Priority:  High Start Date:  08/05/20                           Expected End Date:  08/05/21  Next Follow up date: 02/16/21        Self Care Activities:  Continue to keep all scheduled follow up appointments Take medications as directed  Let your healthcare team know if you are unable to take your medications Call your pharmacy for refills at least 7 days prior to running out of medication Patient Goals: -Eat a Vitamin D rich diet and get at least 15 minutes of natural sunlight daily when possible

## 2020-11-29 ENCOUNTER — Other Ambulatory Visit: Payer: Self-pay

## 2020-11-29 ENCOUNTER — Emergency Department (HOSPITAL_COMMUNITY): Payer: Medicare Other

## 2020-11-29 ENCOUNTER — Encounter (HOSPITAL_COMMUNITY): Payer: Self-pay | Admitting: *Deleted

## 2020-11-29 ENCOUNTER — Emergency Department (HOSPITAL_COMMUNITY)
Admission: EM | Admit: 2020-11-29 | Discharge: 2020-11-29 | Disposition: A | Discharge status: Home / Self Care | Payer: Medicare Other | Attending: Emergency Medicine | Admitting: Emergency Medicine

## 2020-11-29 DIAGNOSIS — N201 Calculus of ureter: Secondary | ICD-10-CM | POA: Diagnosis not present

## 2020-11-29 DIAGNOSIS — Z7984 Long term (current) use of oral hypoglycemic drugs: Secondary | ICD-10-CM | POA: Diagnosis not present

## 2020-11-29 DIAGNOSIS — R197 Diarrhea, unspecified: Secondary | ICD-10-CM | POA: Insufficient documentation

## 2020-11-29 DIAGNOSIS — Z743 Need for continuous supervision: Secondary | ICD-10-CM | POA: Diagnosis not present

## 2020-11-29 DIAGNOSIS — R109 Unspecified abdominal pain: Secondary | ICD-10-CM | POA: Diagnosis not present

## 2020-11-29 DIAGNOSIS — Z79899 Other long term (current) drug therapy: Secondary | ICD-10-CM | POA: Insufficient documentation

## 2020-11-29 DIAGNOSIS — E119 Type 2 diabetes mellitus without complications: Secondary | ICD-10-CM | POA: Insufficient documentation

## 2020-11-29 DIAGNOSIS — Z87891 Personal history of nicotine dependence: Secondary | ICD-10-CM | POA: Diagnosis not present

## 2020-11-29 DIAGNOSIS — I10 Essential (primary) hypertension: Secondary | ICD-10-CM | POA: Insufficient documentation

## 2020-11-29 DIAGNOSIS — D72829 Elevated white blood cell count, unspecified: Secondary | ICD-10-CM | POA: Insufficient documentation

## 2020-11-29 DIAGNOSIS — R69 Illness, unspecified: Secondary | ICD-10-CM | POA: Diagnosis not present

## 2020-11-29 DIAGNOSIS — R1111 Vomiting without nausea: Secondary | ICD-10-CM | POA: Diagnosis not present

## 2020-11-29 DIAGNOSIS — R112 Nausea with vomiting, unspecified: Secondary | ICD-10-CM

## 2020-11-29 DIAGNOSIS — R110 Nausea: Secondary | ICD-10-CM | POA: Diagnosis not present

## 2020-11-29 LAB — URINALYSIS, ROUTINE W REFLEX MICROSCOPIC
Bacteria, UA: NONE SEEN
Bilirubin Urine: NEGATIVE
Glucose, UA: NEGATIVE mg/dL
Ketones, ur: NEGATIVE mg/dL
Leukocytes,Ua: NEGATIVE
Nitrite: NEGATIVE
Protein, ur: NEGATIVE mg/dL
Specific Gravity, Urine: 1.033 — ABNORMAL HIGH (ref 1.005–1.030)
pH: 5 (ref 5.0–8.0)

## 2020-11-29 LAB — CBC WITH DIFFERENTIAL/PLATELET
Abs Immature Granulocytes: 0.11 10*3/uL — ABNORMAL HIGH (ref 0.00–0.07)
Basophils Absolute: 0.1 10*3/uL (ref 0.0–0.1)
Basophils Relative: 0 %
Eosinophils Absolute: 0.9 10*3/uL — ABNORMAL HIGH (ref 0.0–0.5)
Eosinophils Relative: 8 %
HCT: 39.1 % (ref 36.0–46.0)
Hemoglobin: 12.5 g/dL (ref 12.0–15.0)
Immature Granulocytes: 1 %
Lymphocytes Relative: 28 %
Lymphs Abs: 3.1 10*3/uL (ref 0.7–4.0)
MCH: 28.4 pg (ref 26.0–34.0)
MCHC: 32 g/dL (ref 30.0–36.0)
MCV: 88.9 fL (ref 80.0–100.0)
Monocytes Absolute: 1.1 10*3/uL — ABNORMAL HIGH (ref 0.1–1.0)
Monocytes Relative: 9 %
Neutro Abs: 6 10*3/uL (ref 1.7–7.7)
Neutrophils Relative %: 54 %
Platelets: 249 10*3/uL (ref 150–400)
RBC: 4.4 MIL/uL (ref 3.87–5.11)
RDW: 19.8 % — ABNORMAL HIGH (ref 11.5–15.5)
WBC: 11.3 10*3/uL — ABNORMAL HIGH (ref 4.0–10.5)
nRBC: 0 % (ref 0.0–0.2)

## 2020-11-29 LAB — COMPREHENSIVE METABOLIC PANEL
ALT: 18 U/L (ref 0–44)
AST: 17 U/L (ref 15–41)
Albumin: 3.6 g/dL (ref 3.5–5.0)
Alkaline Phosphatase: 106 U/L (ref 38–126)
Anion gap: 8 (ref 5–15)
BUN: 19 mg/dL (ref 8–23)
CO2: 29 mmol/L (ref 22–32)
Calcium: 9.4 mg/dL (ref 8.9–10.3)
Chloride: 103 mmol/L (ref 98–111)
Creatinine, Ser: 0.97 mg/dL (ref 0.44–1.00)
GFR, Estimated: >60 mL/min (ref >60)
Glucose, Bld: 196 mg/dL — ABNORMAL HIGH (ref 70–99)
Potassium: 4.3 mmol/L (ref 3.5–5.1)
Sodium: 140 mmol/L (ref 135–145)
Total Bilirubin: 1 mg/dL (ref 0.3–1.2)
Total Protein: 7.1 g/dL (ref 6.5–8.1)

## 2020-11-29 LAB — LIPASE, BLOOD: Lipase: 45 U/L (ref 11–51)

## 2020-11-29 MED ORDER — IOHEXOL 350 MG/ML SOLN
75.0000 mL | Freq: Once | INTRAVENOUS | Status: AC | PRN
  Administered 2020-11-29: 75 mL via INTRAVENOUS

## 2020-11-29 MED ORDER — SODIUM CHLORIDE 0.9 % IV BOLUS
1000.0000 mL | Freq: Once | INTRAVENOUS | Status: AC
  Administered 2020-11-29: 1000 mL via INTRAVENOUS

## 2020-11-29 MED ORDER — ONDANSETRON 4 MG PO TBDP
4.0000 mg | ORAL_TABLET | Freq: Once | ORAL | Status: AC
  Administered 2020-11-29: 4 mg via ORAL
  Filled 2020-11-29: qty 1

## 2020-11-29 MED ORDER — ONDANSETRON 4 MG PO TBDP: 4.0000 mg | ORAL_TABLET | Freq: Three times a day (TID) | ORAL | 0 refills | Status: DC | PRN

## 2020-11-29 NOTE — ED Notes (Signed)
Reviewed discharge instructions with patient. Follow-up care and medications reviewed. Patient  verbalized understanding. Patient A&Ox4, VSS, and ambulatory with steady gait upon discharge.  °

## 2020-11-29 NOTE — ED Provider Notes (Signed)
Emergency Medicine Provider Triage Evaluation Note  Joanna Sandoval , a 75 y.o. female  was evaluated in triage.  Pt complains of vomiting.  The patient reports nausea, vomiting, diarrhea, and lower abdominal pain that began earlier tonight after going out for her birthday dinner.  She reports that she had a nonalcoholic [employer], steak, baked potato, salad, and roast at dinner.  No alcohol.  She reports associated lower abdominal pain.  No fever, chills, cough, shortness of breath, dysuria, hematuria.  She reports that she lives alone and was worried about becoming increasingly weak associated with her symptoms.  Review of Systems  Positive: Abdominal pain, nausea, vomiting, diarrhea Negative: Fever, chills, cough, shortness of breath, dysuria, hematuria, urinary frequency, syncope  Physical Exam  BP (!) 144/76 (BP Location: Left Arm)   Pulse 91   Temp 98.9 F (37.2 C) (Oral)   Resp 18   SpO2 95%  Gen:   Awake, no distress   Resp:  Normal effort  MSK:   Moves extremities without difficulty  Other:  Tender to palpation in the bilateral lower abdomen.  Medical Decision Making  Medically screening exam initiated at 3:33 AM.  Appropriate orders placed.  [name] was informed that the remainder of the evaluation will be completed by another provider, this initial triage assessment does not replace that evaluation, and the importance of remaining in the ED until their evaluation is complete.  Labs and imaging have been ordered.  She will require further work-up and evaluation in the emergency department.  Zofran given in triage for nausea.   Joanne Gavel, PA-C 11/29/20 0341    Fatima Blank, MD 11/29/20 [phone]

## 2020-11-29 NOTE — Discharge Instructions (Addendum)
Follow up with [facility] regarding ED visit today/likely passed kidney stone  Pick up the nausea medication and take as needed. Drink plenty of fluids to stay hydrated  Return to the ED for any new/worsening symptoms

## 2020-11-29 NOTE — ED Triage Notes (Signed)
C/o diarrhea earlier yst afternoon, states she went out to eat for her birthday and after returning home c/o sharp abd. Pain with one episode of vomiting. With more diarrhea, states after vomiting she didn't feel any better.

## 2020-11-29 NOTE — ED Provider Notes (Signed)
[facility] DEPART[facility] Provider Note   CSN: [id number] Arrival date & time: 11/29/20  0325     History Chief Complaint  Patient presents with   Abdominal Pain   Emesis    Joanna Sandoval is a 75 y.o. female with PMHx HTN, Hypercholesterolemia, and Diabetes who presents to the ED today via EMS with complaint of nausea, NBNB emesis (3 episodes), diarrhea that began yesterday on her way to her birthday dinner. She does mention having some left sided abdominal pain for the past few days but did not think much of it without hx of same. She states that she was very concerned as she lives alone and did not want anything bad to happen so she called EMS. Pt denies fevers, chills, chest pain, SOB, urinary symptoms.   The history is provided by the patient and medical records.      Past Medical History:  Diagnosis Date   Arthritis    Diabetes mellitus    Hypercholesteremia    Hypertension    Post-menopausal bleeding    Postmenopausal bleeding 08/30/2011   Normal EBX 09/2011.    Patient Active Problem List   Diagnosis Date Noted   Colon cancer screening 05/05/2020   Morbid obesity ([place]) 05/05/2020   Type 2 diabetes mellitus without complication, without long-term current use of insulin ([place]) 07/31/2018   Essential hypertension 04/30/2018   Fibroid 11/11/2011    Past Surgical History:  Procedure Laterality Date   CATARACT EXTRACTION     DILATION AND CURETTAGE OF UTERUS     HYSTEROSCOPY WITH D & C N/A 05/02/2017   Procedure: DILATATION AND CURETTAGE /HYSTEROSCOPY;  Surgeon: Mora Bellman, MD;  Location: [facility];  Service: Gynecology;  Laterality: N/A;   KNEE ARTHROSCOPY       OB History     Gravida  4   Para  3   Term  3   Preterm      AB  1   Living  1      SAB  1   IAB      Ectopic      Multiple      Live Births              Family History  Problem Relation Age of Onset   Hypertension Mother     Hypertension Father    Cancer Father        ?lung   Diabetes Sister     Social History   Tobacco Use   Smoking status: Former    Types: Cigarettes   Smokeless tobacco: Never  Vaping Use   Vaping Use: Never used  Substance Use Topics   Alcohol use: No   Drug use: No    Home Medications Prior to Admission medications   Medication Sig Start Date End Date Taking? Authorizing Provider  ondansetron (ZOFRAN ODT) 4 MG disintegrating tablet Take 1 tablet (4 mg total) by mouth every 8 (eight) hours as needed for nausea or vomiting. 11/29/20  Yes Alroy Bailiff, Beau Ramsburg, PA-C  Accu-Chek Softclix Lancets lancets USE AS DIRECTED 11/15/20   [name], FNP  albuterol (PROVENTIL HFA;VENTOLIN HFA) 108 (90 Base) MCG/ACT inhaler Inhale 2 puffs into the lungs every 6 (six) hours as needed.    [provider]  atenolol (TENORMIN) 100 MG tablet Take 50 mg by mouth daily.     [provider]  atorvastatin (LIPITOR) 40 MG tablet Take 1 tablet (40 mg total) by mouth daily. Take 1/2  pill at bedtime 05/10/18   [name], FNP  Blood Glucose Monitoring Suppl (ACCU-CHEK GUIDE) w/Device KIT USE TWICE DAILY AT 10AM AND 5PM 04/07/20   [name], FNP  carboxymethylcellulose (REFRESH PLUS) 0.5 % SOLN 1 drop 3 (three) times daily as needed.    [provider]  D3-50 50000 units capsule  01/14/17   [provider]  diclofenac sodium (VOLTAREN) 1 % GEL Apply 2 g topically 4 (four) times daily. 10/30/18   [name], FNP  glimepiride (AMARYL) 2 MG tablet TAKE 1 TABLET BY MOUTH IN  THE MORNING AND AT BEDTIME 07/29/20   [name], FNP  glucose blood (ACCU-CHEK GUIDE) test strip Use as instructed 05/05/19   [name], FNP  [name] KIT Use to test blood sugar twice daily as directed. E11.65 05/02/19   [name], FNP  latanoprost (XALATAN) 0.005 % ophthalmic solution 1 drop at bedtime. 05/16/19   [provider]  losartan (COZAAR) 50 MG  tablet Take 50 mg by mouth daily.    [provider]  Magnesium 200 MG TABS Take 1 tab by mouth daily with evening meal 07/30/19   [name], FNP  naproxen (NAPROSYN) 500 MG tablet Take 1 tablet (500 mg total) by mouth 2 (two) times daily. 10/17/19   Coralyn Mark, NP  Semaglutide (RYBELSUS) 7 MG TABS Take 1 tablet by mouth daily. 11/08/20   [name], FNP    Allergies    Patient has no known allergies.  Review of Systems   Review of Systems  Constitutional:  Negative for chills and fever.  Respiratory:  Negative for cough and shortness of breath.   Cardiovascular:  Negative for chest pain.  Gastrointestinal:  Positive for abdominal pain, diarrhea, nausea and vomiting.  Genitourinary:  Positive for flank pain. Negative for dysuria and frequency.  All other systems reviewed and are negative.  Physical Exam Updated Vital Signs BP 137/61 (BP Location: Left Arm)   Pulse 82   Temp 98.3 F (36.8 C) (Oral)   Resp 15   Ht 5\' 2"  (1.575 m)   Wt 93.4 kg   SpO2 99%   BMI 37.68 kg/m   Physical Exam Vitals and nursing note reviewed.  Constitutional:      Appearance: She is not ill-appearing or diaphoretic.  HENT:     Head: Normocephalic and atraumatic.  Eyes:     Conjunctiva/sclera: Conjunctivae normal.  Cardiovascular:     Rate and Rhythm: Normal rate and regular rhythm.     Heart sounds: Normal heart sounds.  Pulmonary:     Effort: Pulmonary effort is normal.     Breath sounds: Normal breath sounds. No wheezing, rhonchi or rales.  Abdominal:     Palpations: Abdomen is soft.     Tenderness: There is abdominal tenderness in the suprapubic area and left lower quadrant. There is no guarding or rebound.  Musculoskeletal:     Cervical back: Neck supple.  Skin:    General: Skin is warm and dry.  Neurological:     Mental Status: She is alert.    ED Results / Procedures / Treatments   Labs (all labs ordered are listed, but only abnormal results are  displayed) Labs Reviewed  CBC WITH DIFFERENTIAL/PLATELET - Abnormal; Notable for the following components:      Result Value   WBC 11.3 (*)    RDW 19.8 (*)    Monocytes Absolute 1.1 (*)    Eosinophils Absolute 0.9 (*)  Abs Immature Granulocytes 0.11 (*)    All other components within normal limits  COMPREHENSIVE METABOLIC PANEL - Abnormal; Notable for the following components:   Glucose, Bld 196 (*)    All other components within normal limits  URINALYSIS, ROUTINE W REFLEX MICROSCOPIC - Abnormal; Notable for the following components:   Specific Gravity, Urine 1.033 (*)    Hgb urine dipstick MODERATE (*)    All other components within normal limits  LIPASE, BLOOD    EKG None  Radiology CT ABDOMEN PELVIS W CONTRAST  Result Date: 11/29/2020 CLINICAL DATA:  76 year old female with nausea, abdominal pain and diarrhea. EXAM: CT ABDOMEN AND PELVIS WITH CONTRAST TECHNIQUE: Multidetector CT imaging of the abdomen and pelvis was performed using the standard protocol following bolus administration of intravenous contrast. CONTRAST:  11mL OMNIPAQUE IOHEXOL 350 MG/ML SOLN COMPARISON:  Noncontrast CT Abdomen and Pelvis 11/14/2011. FINDINGS: Lower chest: Stable elevation of the right hemidiaphragm. Cardiac size at the upper limits of normal. No pericardial effusion. Bilateral lung base atelectasis appears similar to that in 2013. No pleural effusion. Hepatobiliary: The top of the liver dome is included on the delayed images. Negative liver and gallbladder. No bile duct enlargement. Pancreas: Negative. Spleen: Negative. Adrenals/Urinary Tract: Normal adrenal glands. Chronic left nephrolithiasis, 5 mm mid to lower pole calculus. Punctate right nephrolithiasis. Right renal enhancement and contrast excretion appears normal, with no right hydronephrosis. However, there is mild left hydronephrosis and mild delayed nephrogram on the initial contrast phase. There is also subtle left hydroureter and urothelial  thickening continuing into the pelvis. No ureteral calculus is identified. However, there is a small 4-5 mm calculus located dependently in the bladder just to the right of midline on series 3, image 74. Bladder otherwise unremarkable. Incidental pelvic phleboliths noted. Subtle superimposed left renal cortical scarring. Stomach/Bowel: Negative large bowel. Redundant descending colon. Normal retrocecal appendix (series 3, image 45). Decompressed and negative terminal ileum. No dilated small bowel. Stomach and duodenum are within normal limits. No perigastric inflammation. No free air or free fluid. Vascular/Lymphatic: Mild Aortoiliac calcified atherosclerosis. Major arterial structures in the abdomen and pelvis are patent. Portal venous system is patent. Central venous structures appear patent. No lymphadenopathy. Reproductive: Chronic lobulated uterus appears related to multiple fibroids, some with small dystrophic calcifications. Size and configuration stable since 2013. Ovaries within normal limits. Other: No pelvic free fluid. Musculoskeletal: Chronic but increased grade 1 spondylolisthesis in the lower lumbar spine with associated advanced disc and facet degeneration. Associated vacuum phenomena. No acute osseous abnormality identified. IMPRESSION: 1. Constellation suggestive of a recently passed left side ureteral calculus: a 4-5 mm calculus located dependently in the bladder is superimposed on mild left hydronephrosis, left hydroureter, and mildly delayed left renal enhancement. Underlying bilateral nephrolithiasis. 2. No other acute or inflammatory process identified in the abdomen or pelvis. Normal appendix. 3. Fibroid uterus. Chronic but increased grade 1 spondylolisthesis in the lower lumbar spine since 2013. Aortic Atherosclerosis (ICD10-I70.0). Electronically Signed   By: Genevie Ann M.D.   On: 11/29/2020 05:28    Procedures Procedures   Medications Ordered in ED Medications  ondansetron (ZOFRAN-ODT)  disintegrating tablet 4 mg (4 mg Oral Given 11/29/20 0342)  iohexol (OMNIPAQUE) 350 MG/ML injection 75 mL (75 mLs Intravenous Contrast Given 11/29/20 0513)  sodium chloride 0.9 % bolus 1,000 mL (1,000 mLs Intravenous New Bag/Given 11/29/20 1151)    ED Course  I have reviewed the triage vital signs and the nursing notes.  Pertinent labs & imaging results that were available  during my care of the patient were reviewed by me and considered in my medical decision making (see chart for details).    MDM Rules/Calculators/A&P                           75 year old female who presents to the ED today with complaint of left-sided abdominal pain for the past few days, nausea, vomiting, diarrhea that started yesterday.  On arrival to the ED vitals are stable.  Patient's work-up was started while in the waiting room including lab work and CT abdomen and pelvis.  CBC does show leukocytosis of 11,300 today.  Hemoglobin stable at 12.5.  CMP with a slightly elevated sugar at 196, history of diabetes.  Bicarb within normal limits and no gap.  Creatinine stable at 0.97.  No LFT elevation.  Lipase within normal limits at 45.  CT scan does show findings consistent with likely passed kidney stone.  When patient is brought back to room she states that her pain is resolved.  She has some mild residual left lower quadrant and suprapubic tenderness palpation however states it is significantly improved from previous.  She has not urinated for [place] at this time.  We will plan to check UA to assess for any signs of infected kidney stone however patient is overall comfortable appearing and I doubt that she has a UTI today.  We will plan for fluid rehydration as well as she has some slightly tacky mucous membranes.  U/A without signs of infection Pt able to tolerate PO in the ED without emesis. Stable for discharge at this time. Will plan to have pt follow up with [facility] and PCP. She is in agreement with plan and stable for  discharge home.   This note was prepared using Dragon voice recognition software and may include unintentional dictation errors due to the inherent limitations of voice recognition software.   Final Clinical Impression(s) / ED Diagnoses Final diagnoses:  Nausea vomiting and diarrhea  Ureterolithiasis    Rx / DC Orders ED Discharge Orders          Ordered    ondansetron (ZOFRAN ODT) 4 MG disintegrating tablet  Every 8 hours PRN        11/29/20 1349             Discharge Instructions      Follow up with [facility] regarding ED visit today/likely passed kidney stone  Pick up the nausea medication and take as needed. Drink plenty of fluids to stay hydrated  Return to the ED for any new/worsening symptoms       Eustaquio Maize, PA-C 11/29/20 1350    Davonna Belling, MD 11/29/20 [phone]

## 2020-11-30 ENCOUNTER — Telehealth: Payer: Self-pay

## 2020-11-30 NOTE — Telephone Encounter (Signed)
I called pt to check on her after she went to the ER. I left pt vm to call the office back so schedule an appt. YL,RMA

## 2020-12-01 ENCOUNTER — Encounter: Payer: Self-pay | Admitting: Nurse Practitioner

## 2020-12-01 ENCOUNTER — Ambulatory Visit (INDEPENDENT_AMBULATORY_CARE_PROVIDER_SITE_OTHER): Payer: Medicare Other | Admitting: Nurse Practitioner

## 2020-12-01 ENCOUNTER — Ambulatory Visit (INDEPENDENT_AMBULATORY_CARE_PROVIDER_SITE_OTHER): Payer: Medicare Other

## 2020-12-01 ENCOUNTER — Telehealth: Payer: Medicare Other

## 2020-12-01 ENCOUNTER — Other Ambulatory Visit: Payer: Self-pay

## 2020-12-01 VITALS — BP 110/74 | HR 92 | Temp 99.1°F | Ht 62.0 in | Wt 210.8 lb

## 2020-12-01 DIAGNOSIS — N200 Calculus of kidney: Secondary | ICD-10-CM | POA: Diagnosis not present

## 2020-12-01 DIAGNOSIS — I700 Atherosclerosis of aorta: Secondary | ICD-10-CM | POA: Diagnosis not present

## 2020-12-01 DIAGNOSIS — E559 Vitamin D deficiency, unspecified: Secondary | ICD-10-CM

## 2020-12-01 DIAGNOSIS — I10 Essential (primary) hypertension: Secondary | ICD-10-CM | POA: Diagnosis not present

## 2020-12-01 DIAGNOSIS — E119 Type 2 diabetes mellitus without complications: Secondary | ICD-10-CM

## 2020-12-01 DIAGNOSIS — R5383 Other fatigue: Secondary | ICD-10-CM

## 2020-12-01 DIAGNOSIS — R319 Hematuria, unspecified: Secondary | ICD-10-CM | POA: Diagnosis not present

## 2020-12-01 DIAGNOSIS — R197 Diarrhea, unspecified: Secondary | ICD-10-CM

## 2020-12-01 LAB — CBC WITH DIFFERENTIAL/PLATELET
Basophils Absolute: 0.1 10*3/uL (ref 0.0–0.2)
Basos: 1 %
EOS (ABSOLUTE): 0.5 10*3/uL — ABNORMAL HIGH (ref 0.0–0.4)
Eos: 5 %
Hematocrit: 29.9 % — ABNORMAL LOW (ref 34.0–46.6)
Hemoglobin: 9.7 g/dL — ABNORMAL LOW (ref 11.1–15.9)
Immature Grans (Abs): 0 10*3/uL (ref 0.0–0.1)
Immature Granulocytes: 0 %
Lymphocytes Absolute: 2.2 10*3/uL (ref 0.7–3.1)
Lymphs: 21 %
MCH: 30.9 pg (ref 26.6–33.0)
MCHC: 32.4 g/dL (ref 31.5–35.7)
MCV: 95 fL (ref 79–97)
Monocytes Absolute: 0.5 10*3/uL (ref 0.1–0.9)
Monocytes: 5 %
Neutrophils Absolute: 6.9 10*3/uL (ref 1.4–7.0)
Neutrophils: 68 %
Platelets: 450 10*3/uL (ref 150–450)
RBC: 3.14 x10E6/uL — ABNORMAL LOW (ref 3.77–5.28)
RDW: 12.6 % (ref 11.7–15.4)
WBC: 10.2 10*3/uL (ref 3.4–10.8)

## 2020-12-01 LAB — POCT URINALYSIS DIPSTICK
Bilirubin, UA: NEGATIVE
Glucose, UA: NEGATIVE
Leukocytes, UA: NEGATIVE
Nitrite, UA: NEGATIVE
Protein, UA: NEGATIVE
Spec Grav, UA: 1.025 (ref 1.010–1.025)
Urobilinogen, UA: 0.2 E.U./dL
pH, UA: 5.5 (ref 5.0–8.0)

## 2020-12-01 MED ORDER — ATORVASTATIN CALCIUM 20 MG PO TABS: 20.0000 mg | ORAL_TABLET | Freq: Every day | ORAL | 1 refills | Status: DC

## 2020-12-01 NOTE — Progress Notes (Signed)
I,Katawbba Wiggins,acting as a Education administrator for [employer], FNP.,have documented all relevant documentation on the behalf of [name], FNP,as directed by  [name], FNP while in the presence of [name], [place].  This visit occurred during the SARS-CoV-2 public health emergency.  Safety protocols were in place, including screening questions prior to the visit, additional usage of staff PPE, and extensive cleaning of exam room while observing appropriate contact time as indicated for disinfecting solutions.  Subjective:     Patient ID: Joanna Sandoval , female    DOB: Aug 01, 1945 , 75 y.o.   MRN: [id number]   Chief Complaint  Patient presents with   Follow-up    HPI  The patient is here today for an emergency room f/u from 8/14 after becoming ill the night of her birthday dinner with diarrhea, vomiting and abdominal pain. She Admits to not drinking adequate amounts of water daily.  She does drink ginger ale and coke. She had also gone to [facility] Her CT scan of abdomen showed residual of a kidney stone and additional bilateral kidney stones. She also had an incidental finding of aortic atherosclerosis.  She was treated with IVFs and pain medications. In addition to antiemetics.   Since being home she has taken pepto bismol x 3 times for some mild diarrhea. She does report she is feeling better. She is here today with her niece Joanna Sandoval.   Other This is a new problem. The current episode started in the past 7 days. The problem occurs intermittently. The problem has been gradually improving. Pertinent negatives include no abdominal pain, chest pain, coughing, fatigue, nausea, numbness, vomiting or weakness.    Past Medical History:  Diagnosis Date   Arthritis    Diabetes mellitus    Hypercholesteremia    Hypertension    Post-menopausal bleeding    Postmenopausal bleeding 08/30/2011   Normal EBX 09/2011.     Family History  Problem Relation Age of Onset   Hypertension Mother     Hypertension Father    Cancer Father        ?lung   Diabetes Sister      Current Outpatient Medications:    Accu-Chek Softclix Lancets lancets, USE AS DIRECTED, Disp: 200 each, Rfl: 3   albuterol (PROVENTIL HFA;VENTOLIN HFA) 108 (90 Base) MCG/ACT inhaler, Inhale 2 puffs into the lungs every 6 (six) hours as needed., Disp: , Rfl:    atenolol (TENORMIN) 100 MG tablet, Take 50 mg by mouth daily. , Disp: , Rfl:    atorvastatin (LIPITOR) 20 MG tablet, Take 1 tablet (20 mg total) by mouth daily., Disp: 90 tablet, Rfl: 1   Blood Glucose Monitoring Suppl (ACCU-CHEK GUIDE) w/Device KIT, USE TWICE DAILY AT 10AM AND 5PM, Disp: 1 kit, Rfl: 1   carboxymethylcellulose (REFRESH PLUS) 0.5 % SOLN, 1 drop 3 (three) times daily as needed., Disp: , Rfl:    D3-50 50000 units capsule, , Disp: , Rfl: 0   diclofenac sodium (VOLTAREN) 1 % GEL, Apply 2 g topically 4 (four) times daily., Disp: 100 g, Rfl: 1   glimepiride (AMARYL) 2 MG tablet, TAKE 1 TABLET BY MOUTH IN  THE MORNING AND AT BEDTIME, Disp: 180 tablet, Rfl: 3   glucose blood (ACCU-CHEK GUIDE) test strip, Use as instructed, Disp: 100 each, Rfl: 12   [name] KIT, Use to test blood sugar twice daily as directed. E11.65, Disp: 1 kit, Rfl: 12   latanoprost (XALATAN) 0.005 % ophthalmic solution, 1 drop at bedtime.,  Disp: , Rfl:    losartan (COZAAR) 50 MG tablet, Take 50 mg by mouth daily., Disp: , Rfl:    Magnesium 200 MG TABS, Take 1 tab by mouth daily with evening meal, Disp: 30 tablet, Rfl: 2   naproxen (NAPROSYN) 500 MG tablet, Take 1 tablet (500 mg total) by mouth 2 (two) times daily., Disp: 30 tablet, Rfl: 0   ondansetron (ZOFRAN ODT) 4 MG disintegrating tablet, Take 1 tablet (4 mg total) by mouth every 8 (eight) hours as needed for nausea or vomiting., Disp: 20 tablet, Rfl: 0   Semaglutide (RYBELSUS) 7 MG TABS, Take 1 tablet by mouth daily., Disp: 90 tablet, Rfl: 1   No Known Allergies   Review of Systems   Constitutional:  Negative for fatigue.  Respiratory:  Negative for cough and wheezing.   Cardiovascular: Negative.  Negative for chest pain, palpitations and leg swelling.  Gastrointestinal:  Positive for diarrhea. Negative for abdominal pain, nausea and vomiting.  Neurological:  Negative for dizziness, weakness and numbness.  Psychiatric/Behavioral: Negative.      Today's Vitals   12/01/20 1524  BP: 110/74  Pulse: 92  Temp: 99.1 F (37.3 C)  TempSrc: Oral  Weight: 210 lb 12.8 oz (95.6 kg)  Height: $Remove'5\' 2"'vACmOmt$  (1.575 m)  PainSc: 8   PainLoc: Back   Body mass index is 38.56 kg/m.  Wt Readings from Last 3 Encounters:  12/01/20 210 lb 12.8 oz (95.6 kg)  11/29/20 206 lb (93.4 kg)  11/08/20 206 lb (93.4 kg)    BP Readings from Last 3 Encounters:  12/01/20 110/74  11/29/20 (!) 151/88  11/08/20 134/80    Objective:  Physical Exam Vitals reviewed.  Constitutional:      General: She is not in acute distress.    Appearance: Normal appearance.  Cardiovascular:     Rate and Rhythm: Normal rate and regular rhythm.     Pulses: Normal pulses.     Heart sounds: Normal heart sounds. No murmur heard. Pulmonary:     Effort: Pulmonary effort is normal. No respiratory distress.     Breath sounds: Normal breath sounds. No wheezing.  Abdominal:     General: Bowel sounds are normal. There is no distension.     Palpations: Abdomen is soft. There is no mass.     Tenderness: There is abdominal tenderness (upper abdomen area).  Skin:    Capillary Refill: Capillary refill takes less than 2 seconds.  Neurological:     General: No focal deficit present.     Mental Status: She is alert and oriented to person, place, and time.     Cranial Nerves: No cranial nerve deficit.     Motor: No weakness.  Psychiatric:        Mood and Affect: Mood normal.        Behavior: Behavior normal.        Thought Content: Thought content normal.        Judgment: Judgment normal.        Assessment And Plan:      1. Bilateral nephrolithiasis Comments: Will refer to Urology for further evaluation, it is thought she did pass one kidney stone via the left ureter - Ambulatory referral to Urology - CBC with Differential/Platelet  2. Aortic atherosclerosis ([place]) Comments: She is taking atorvastatin 20 mg daily Once completed with 1/2 of 40 mg tab she is to start 20 mg whole tab - atorvastatin (LIPITOR) 20 MG tablet; Take 1 tablet (20 mg total) by mouth daily.  Dispense: 90 tablet; Refill: 1 - AMB Referral to [facility]  3. Fatigue, unspecified type  4. Essential hypertension Comments: Well controlled  5. Type 2 diabetes mellitus without complication, without long-term current use of insulin ([facility]) Comments: No changes, discussed lab results during visit - AMB Referral to [facility]  6. Hematuria, unspecified type Comments: She had moderate blood at ER/ Now has trace blood urine - POCT Urinalysis Dipstick (81002)  7. Diarrhea, unspecified type Comments: Advised to avoid taking pepto bismol due to risk for bleeding Take probiotic daily    Patient was given opportunity to ask questions. Patient verbalized understanding of the plan and was able to repeat key elements of the plan. All questions were answered to their satisfaction.  [name], FNP   I, [name], FNP, have reviewed all documentation for this visit. The documentation on 12/01/20 for the exam, diagnosis, procedures, and orders are all accurate and complete.   IF YOU HAVE BEEN REFERRED TO A SPECIALIST, IT MAY TAKE 1-2 WEEKS TO SCHEDULE/PROCESS THE REFERRAL. IF YOU HAVE NOT HEARD FROM US/SPECIALIST IN TWO WEEKS, PLEASE GIVE [place] A CALL AT [phone] X [phone].   THE PATIENT IS ENCOURAGED TO PRACTICE SOCIAL DISTANCING DUE TO THE COVID-19 PANDEMIC.

## 2020-12-01 NOTE — Patient Instructions (Signed)
Kidney Stones Kidney stones are rock-like masses that form inside of the kidneys. Kidneys are organs that make pee (urine). A kidney stone may move into other parts of the urinary tract, including: The tubes that connect the kidneys to the bladder (ureters). The bladder. The tube that carries urine out of the body (urethra). Kidney stones can cause very bad pain and can block the flow of pee. The stone usually leaves your body (passes) through your pee. You may need to have a doctor take out the stone. What are the causes? Kidney stones may be caused by: A condition in which certain glands make too much parathyroid hormone (primary hyperparathyroidism). A buildup of a type of crystals in the bladder made of a chemical called uric acid. The body makes uric acid when you eat certain foods. Narrowing (stricture) of one or both of the ureters. A kidney blockage that you were born with. Past surgery on the kidney or the ureters, such as gastric bypass surgery. What increases the risk? You are more likely to develop this condition if: You have had a kidney stone in the past. You have a family history of kidney stones. You do not drink enough water. You eat a diet that is high in protein, salt (sodium), or sugar. You are overweight or very overweight (obese). What are the signs or symptoms? Symptoms of a kidney stone may include: Pain in the side of the belly, right below the ribs (flank pain). Pain usually spreads (radiates) to the groin. Needing to pee often or right away (urgently). Pain when going pee (urinating). Blood in your pee (hematuria). Feeling like you may vomit (nauseous). Vomiting. Fever and chills. How is this treated? Treatment depends on the size, location, and makeup of the kidney stones. The stones will often pass out of the body through peeing. You may need to: Drink more fluid to help pass the stone. In some cases, you may be given fluids through an IV tube put into one  of your veins at the hospital. Take medicine for pain. Make changes in your diet to help keep kidney stones from coming back. Sometimes, medical procedures are needed to remove a kidney stone. This may involve: A procedure to break up kidney stones using a beam of light (laser) or shock waves. Surgery to remove the kidney stones. Follow these instructions at home: Medicines Take over-the-counter and prescription medicines only as told by your doctor. Ask your doctor if the medicine prescribed to you requires you to avoid driving or using heavy machinery. Eating and drinking Drink enough fluid to keep your pee pale yellow. You may be told to drink at least 8-10 glasses of water each day. This will help you pass the stone. If told by your doctor, change your diet. This may include: Limiting how much salt you eat. Eating more fruits and vegetables. Limiting how much meat, poultry, fish, and eggs you eat. Follow instructions from your doctor about eating or drinking restrictions. General instructions Collect pee samples as told by your doctor. You may need to collect a pee sample: 24 hours after a stone comes out. 8-12 weeks after a stone comes out, and every 6-12 months after that. Strain your pee every time you pee (urinate), for as long as told. Use the strainer that your doctor recommends. Do not throw out the stone. Keep it so that it can be tested by your doctor. Keep all follow-up visits as told by your doctor. This is important. You may need   follow-up tests. How is this prevented? To prevent another kidney stone: Drink enough fluid to keep your pee pale yellow. This is the best way to prevent kidney stones. Eat healthy foods. Avoid certain foods as told by your doctor. You may be told to eat less protein. Stay at a healthy weight. Where to find more information [place] [facility]): www.kidney.[facility]): www.urologyhealth.org Contact a doctor  if: You have pain that gets worse or does not get better with medicine. Get help right away if: You have a fever or chills. You get very bad pain. You get new pain in your belly (abdomen). You pass out (faint). You cannot pee. Summary Kidney stones are rock-like masses that form inside of the kidneys. Kidney stones can cause very bad pain and can block the flow of pee. The stones will often pass out of the body through peeing. Drink enough fluid to keep your pee pale yellow. This information is not intended to replace advice given to you by your health care provider. Make sure you discuss any questions you have with your healthcare provider. Document Revised: 08/16/2018 Document Reviewed: 08/20/2018 Elsevier Patient Education  2022 [employer] (5th ed., pp. [id number]). [place], [place]: Elsevier.">  Dietary Guidelines to Help Prevent Kidney Stones Kidney stones are deposits of minerals and salts that form inside your kidneys. Your risk of developing kidney stones may be greater depending on your diet, your lifestyle, the medicines you take, and whether you have certain medical conditions. Most people can lower their chances of developing kidney stones by following the instructions below. Your dietitian may give you more specific instructions depending on your overall health and the type of kidney stones youtend to develop. What are tips for following this plan? Reading food labels  Choose foods with "no salt added" or "low-salt" labels. Limit your salt (sodium) intake to less than 1,500 mg a day. Choose foods with calcium for each meal and snack. Try to eat about 300 mg of calcium at each meal. Foods that contain 200-500 mg of calcium a serving include: 8 oz (237 mL) of milk, calcium-fortifiednon-dairy milk, and calcium-fortifiedfruit juice. Calcium-fortified means that calcium has been added to these drinks. 8 oz (237 mL) of kefir, yogurt, and soy  yogurt. 4 oz (114 g) of tofu. 1 oz (28 g) of cheese. 1 cup (150 g) of dried figs. 1 cup (91 g) of cooked broccoli. One 3 oz (85 g) can of sardines or mackerel. Most people need 1,000-1,500 mg of calcium a day. Talk to your dietitian abouthow much calcium is recommended for you. Shopping Buy plenty of fresh fruits and vegetables. Most people do not need to avoid fruits and vegetables, even if these foods contain nutrients that may contribute to kidney stones. When shopping for convenience foods, choose: Whole pieces of fruit. Pre-made salads with dressing on the side. Low-fat fruit and yogurt smoothies. Avoid buying frozen meals or prepared deli foods. These can be high in sodium. Look for foods with live cultures, such as yogurt and kefir. Choose high-fiber grains, such as whole-wheat breads, oat bran, and wheat cereals. Cooking Do not add salt to food when cooking. Place a salt shaker on the table and allow each person to add his or her own salt to taste. Use vegetable protein, such as beans, textured vegetable protein (TVP), or tofu, instead of meat in pasta, casseroles, and soups. Meal planning Eat less salt, if told by your dietitian.  To do this: Avoid eating processed or pre-made food. Avoid eating fast food. Eat less animal protein, including cheese, meat, poultry, or fish, if told by your dietitian. To do this: Limit the number of times you have meat, poultry, fish, or cheese each week. Eat a diet free of meat at least 2 days a week. Eat only one serving each day of meat, poultry, fish, or seafood. When you prepare animal protein, cut pieces into small portion sizes. For most meat and fish, one serving is about the size of the palm of your hand. Eat at least five servings of fresh fruits and vegetables each day. To do this: Keep fruits and vegetables on hand for snacks. Eat one piece of fruit or a handful of berries with breakfast. Have a salad and fruit at lunch. Have two kinds  of vegetables at dinner. Limit foods that are high in a substance called oxalate. These include: Spinach (cooked), rhubarb, beets, sweet potatoes, and Swiss chard. Peanuts. Potato chips, french fries, and baked potatoes with skin on. Nuts and nut products. Chocolate. If you regularly take a diuretic medicine, make sure to eat at least 1 or 2 servings of fruits or vegetables that are high in potassium each day. These include: Avocado. Banana. Orange, prune, carrot, or tomato juice. Baked potato. Cabbage. Beans and split peas. Lifestyle  Drink enough fluid to keep your urine pale yellow. This is the most important thing you can do. Spread your fluid intake throughout the day. If you drink alcohol: Limit how much you use to: 0-1 drink a day for women who are not pregnant. 0-2 drinks a day for men. Be aware of how much alcohol is in your drink. In the [place] one drink equals one 12 oz bottle of beer (355 mL), one 5 oz glass of wine (148 mL), or one 1 oz glass of hard liquor (44 mL). Lose weight if told by your health care provider. Work with your dietitian to find an eating plan and weight loss strategies that work best for you.  General information Talk to your health care provider and dietitian about taking daily supplements. You may be told the following depending on your health and the cause of your kidney stones: Not to take supplements with vitamin C. To take a calcium supplement. To take a daily probiotic supplement. To take other supplements such as magnesium, fish oil, or vitamin B6. Take over-the-counter and prescription medicines only as told by your health care provider. These include supplements. What foods should I limit? Limit your intake of the following foods, or eat them as told by your dietitian. Vegetables Spinach. Rhubarb. Beets. Canned vegetables. Angie Fava. Olives. Baked potatoeswith skin. Grains Wheat bran. Baked goods. Salted crackers. Cereals high in  sugar. Meats and other proteins Nuts. Nut butters. Large portions of meat, poultry, or fish. Salted, precooked,or cured meats, such as sausages, meat loaves, and hot dogs. Dairy Cheese. Beverages Regular soft drinks. Regular vegetable juice. Seasonings and condiments Seasoning blends with salt. Salad dressings. Soy sauce. Ketchup. Barbecue sauce. Other foods Canned soups. Canned pasta sauce. Casseroles. Pizza. Lasagna. Frozen meals.Potato chips. [place] fries. The items listed above may not be a complete list of foods and beverages you should limit. Contact a dietitian for more information. What foods should I avoid? Talk to your dietitian about specific foods you should avoid based on the typeof kidney stones you have and your overall health. Fruits Grapefruit. The item listed above may not be a complete list of foods  and beverages you should avoid. Contact a dietitian for more information. Summary Kidney stones are deposits of minerals and salts that form inside your kidneys. You can lower your risk of kidney stones by making changes to your diet. The most important thing you can do is drink enough fluid. Drink enough fluid to keep your urine pale yellow. Talk to your dietitian about how much calcium you should have each day, and eat less salt and animal protein as told by your dietitian. This information is not intended to replace advice given to you by your health care provider. Make sure you discuss any questions you have with your healthcare provider. Document Revised: 03/27/2019 Document Reviewed: 03/27/2019 Elsevier Patient Education  2022 [employer].

## 2020-12-02 ENCOUNTER — Telehealth: Payer: Self-pay | Admitting: *Deleted

## 2020-12-02 NOTE — Chronic Care Management (AMB) (Signed)
  Chronic Care Management   Note  12/02/2020 Name: Joanna Sandoval MRN: [id number] DOB: [birth year]  Joanna Sandoval is a 75 y.o. year old female who is a primary care patient of [name], [place]. Joanna Sandoval is currently enrolled in care management services. An additional referral for Pharmacy was placed.   Follow up plan: Telephone appointment with care management team member scheduled for:12/28/20  [facility]  Direct Dial: [phone]

## 2020-12-06 DIAGNOSIS — N202 Calculus of kidney with calculus of ureter: Secondary | ICD-10-CM | POA: Diagnosis not present

## 2020-12-09 NOTE — Patient Instructions (Signed)
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   On track    Timeframe:  Short-Term Goal Priority:  High Start Date:  12/01/20                           Expected End Date:  03/03/22  Next Scheduled follow up: 02/16/21      Self Care Activities:  Patient verbalizes understanding of plan to follow up with PCP provider [name] FNP this afternoon '@3'$ :16 PM to review imaging and discuss next steps  Patient Goals: - follow MD recommendations  - contact MD for new or worsening symptoms as directed                      Diabetes Mellitus - glycemic management optimized   On track    Timeframe:  Long-Range Goal Priority:  High Start Date: 08/05/20                            Expected End Date: 08/05/21  Next Follow up date: 02/16/21       Patient Goal:  - Adhere to prescribed ADA/carb modified diet - increase water to 64 oz daily  - check blood sugars as prescribed      - Self administer oral medications as prescribed - Attend all scheduled provider appointments          Vitamin D deficiency - improved or resolved   On track    Timeframe:  Long-Range Goal Priority:  High Start Date:  08/05/20                           Expected End Date:  08/05/21  Next Follow up date: 02/16/21        Self Care Activities:  Continue to keep all scheduled follow up appointments Take medications as directed  Let your healthcare team know if you are unable to take your medications Call your pharmacy for refills at least 7 days prior to running out of medication Patient Goals: -Eat a Vitamin D rich diet and get at least 15 minutes of natural sunlight daily when possible

## 2020-12-09 NOTE — Chronic Care Management (AMB) (Signed)
Chronic Care Management   CCM RN Visit Note  12/01/2020 Name: Joanna Sandoval MRN: [id number] DOB: [birth year]  Subjective: Joanna Sandoval is a 75 y.o. year old female who is a primary care patient of [name], [place]. The care management team was consulted for assistance with disease management and care coordination needs.    Engaged with patient by telephone for follow up visit in response to provider referral for case management and/or care coordination services.   Consent to Services:  The patient was given information about Chronic Care Management services, agreed to services, and gave verbal consent prior to initiation of services.  Please see initial visit note for detailed documentation.   Patient agreed to services and verbal consent obtained.   Assessment: Review of patient past medical history, allergies, medications, health status, including review of consultants reports, laboratory and other test data, was performed as part of comprehensive evaluation and provision of chronic care management services.   SDOH (Social Determinants of Health) assessments and interventions performed:    CCM Care Plan  No Known Allergies  Outpatient Encounter Medications as of 12/01/2020  Medication Sig   Accu-Chek Softclix Lancets lancets USE AS DIRECTED   albuterol (PROVENTIL HFA;VENTOLIN HFA) 108 (90 Base) MCG/ACT inhaler Inhale 2 puffs into the lungs every 6 (six) hours as needed.   atenolol (TENORMIN) 100 MG tablet Take 50 mg by mouth daily.    atorvastatin (LIPITOR) 20 MG tablet Take 1 tablet (20 mg total) by mouth daily.   Blood Glucose Monitoring Suppl (ACCU-CHEK GUIDE) w/Device KIT USE TWICE DAILY AT 10AM AND 5PM   carboxymethylcellulose (REFRESH PLUS) 0.5 % SOLN 1 drop 3 (three) times daily as needed.   D3-50 50000 units capsule    diclofenac sodium (VOLTAREN) 1 % GEL Apply 2 g topically 4 (four) times daily.   glimepiride (AMARYL) 2 MG tablet TAKE 1 TABLET BY MOUTH IN  THE  MORNING AND AT BEDTIME   glucose blood (ACCU-CHEK GUIDE) test strip Use as instructed   [name] KIT Use to test blood sugar twice daily as directed. E11.65   latanoprost (XALATAN) 0.005 % ophthalmic solution 1 drop at bedtime.   losartan (COZAAR) 50 MG tablet Take 50 mg by mouth daily.   Magnesium 200 MG TABS Take 1 tab by mouth daily with evening meal   naproxen (NAPROSYN) 500 MG tablet Take 1 tablet (500 mg total) by mouth 2 (two) times daily.   ondansetron (ZOFRAN ODT) 4 MG disintegrating tablet Take 1 tablet (4 mg total) by mouth every 8 (eight) hours as needed for nausea or vomiting.   Semaglutide (RYBELSUS) 7 MG TABS Take 1 tablet by mouth daily.   No facility-administered encounter medications on file as of 12/01/2020.    Patient Active Problem List   Diagnosis Date Noted   Colon cancer screening 05/05/2020   Morbid obesity ([place]) 05/05/2020   Type 2 diabetes mellitus without complication, without long-term current use of insulin ([place]) 07/31/2018   Essential hypertension 04/30/2018   Fibroid 11/11/2011    Conditions to be addressed/monitored: Type 2 diabetes mellitus, Essential HTN, Vitamin D deficiency, Bilateral Nephrolithiasis   Care Plan : Diabetes Type 2 (Adult)  Updates made by [name], RN since 12/01/2020 12:00 AM     Problem: Glycemic Management (Diabetes, Type 2)   Priority: High     Long-Range Goal: Glycemic Management Optimized   Start Date: 08/05/2020  Expected End Date: 08/05/2021  Recent Progress: On track  Priority: High  Note:   Objective:  Lab Results  Component Value Date   HGBA1C 7.4 (H) 11/08/2020   Lab Results  Component Value Date   CREATININE 0.88 11/08/2020   CREATININE 0.81 08/04/2020   CREATININE 0.94 01/29/2020   Lab Results  Component Value Date   EGFR 69 11/08/2020   Current Barriers:  Knowledge Deficits related to basic Diabetes pathophysiology and self care/management Knowledge Deficits  related to medications used for management of diabetes Case Manager Clinical Goal(s):  Patient will demonstrate improved adherence to prescribed treatment plan for diabetes self care/management as evidenced by:  daily monitoring and recording of CBG  adherence to ADA/ carb modified diet exercise 3-5 days/week adherence to prescribed medication regimen Interventions:  12/01/20 completed successful outbound call with patient  Collaboration with [name] FNP regarding development and update of comprehensive plan of care as evidenced by provider attestation and co-signature Inter-disciplinary care team collaboration (see longitudinal plan of care) Provided education to patient about basic DM disease process Review of patient status, including review of consultants reports, relevant laboratory and other test results, and medications completed. Re-Educated patient on dietary and exercise recommendations; daily glycemic control FBS 80-130, <180 after meals;15'15' rule Reviewed medications with patient and discussed importance of medication adherence Advised patient, providing education and rationale, to check cbg before meals and at bedtime and record, calling the CCM team and or PCP for findings outside established parameters.  Mailed printed educational materials related to Diabetes Management  Discussed plans with patient for ongoing care management follow up and provided patient with direct contact information for care management team Patient Self-Care Activities  Self administers oral medications as prescribed Attends all scheduled provider appointments Checks blood sugars as prescribed and utilize hyper and hypoglycemia protocol as needed Adheres to prescribed ADA/carb modified Patient Goal:  - Adhere to prescribed ADA/carb modified diet - increase water to 64 oz daily  - check blood sugars as prescribed      - Self administer oral medications as prescribed - Attend all scheduled  provider appointments       Follow Up Plan: Telephone follow up appointment with care management team member scheduled for: 02/16/21    Care Plan : Vitamin D deficiency  Updates made by [name], RN since 12/01/2020 12:00 AM     Problem: Vitamin D deficiency   Priority: High     Long-Range Goal: Vitamin D deficinecy improved or resolved   Start Date: 08/05/2020  Expected End Date: 08/05/2021  Recent Progress: On track  Priority: High  Note:   Current Barriers:  Ineffective Self Health Maintenance  Clinical Goal(s):  Collaboration with [name], FNP regarding development and update of comprehensive plan of care as evidenced by provider attestation and co-signature Inter-disciplinary care team collaboration (see longitudinal plan of care) patient will work with care management team to address care coordination and chronic disease management needs related to Disease Management Educational Needs Care Coordination Medication Management and Education Psychosocial Support   Interventions:  12/01/20 completed successful outbound call with patient  Evaluation of current treatment plan related to  Vitamin D deficiency , self-management and patient's adherence to plan as established by provider. Collaboration with [name], FNP regarding development and update of comprehensive plan of care as evidenced by provider attestation       and co-signature Inter-disciplinary care team collaboration (see longitudinal plan of care) Provided education to patient about basic disease process related to Vitamin D deficiency Review of patient status, including review of consultants  reports, relevant laboratory and other test results, and medications completed. Reviewed medications with patient and discussed importance of medication adherence Educated on dietary recommendations, get at least 15 minutes of natural sunlight when possible  Reviewed scheduled/upcoming provider appointments  including: next PCP follow up appointment scheduled for  Discussed plans with patient for ongoing care management follow up and provided patient with direct contact information for care management team Self Care Activities:  Continue to keep all scheduled follow up appointments Take medications as directed  Let your healthcare team know if you are unable to take your medications Call your pharmacy for refills at least 7 days prior to running out of medication Patient Goals: Eat a Vitamin D rich diet and get at least 15 minutes of natural sunlight daily when possible   Follow Up Plan: Telephone follow up appointment with care management team member scheduled for: 02/16/21     Care Plan : Wellness (Adult)  Updates made by [name], RN since 12/01/2020 12:00 AM     Problem: Bilateral Nephrolithasis   Priority: High     Goal: Bilateral Nephrolithasis evaluated and treated   Start Date: 12/01/2020  Expected End Date: 03/03/2021  This Visit's Progress: On track  Priority: High  Note:   Current Barriers:  Ineffective Self Health Maintenance in a patient with Type 2 diabetes mellitus, Essential HTN, Vitamin D deficiency, Bilateral Nephrolithiasis  Clinical Goal(s):  Collaboration with [name], FNP regarding development and update of comprehensive plan of care as evidenced by provider attestation and co-signature Inter-disciplinary care team collaboration (see longitudinal plan of care) patient will work with care management team to address care coordination and chronic disease management needs related to Disease Management Educational Needs Care Coordination Medication Management and Education Psychosocial Support   Interventions:  12/01/20 completed successful outbound call with patient  Evaluation of current treatment plan related to Type 2 diabetes mellitus, Essential HTN, Vitamin D deficiency, Bilateral Nephrolithiasis , self-management and patient's adherence to plan as  established by provider. Collaboration with [name], FNP regarding development and update of comprehensive plan of care as evidenced by provider attestation       and co-signature Inter-disciplinary care team collaboration (see longitudinal plan of care) Collaboration with Glendale Chard, MD regarding development and update of comprehensive plan of care as evidenced by provider attestation and co-signature Inter-disciplinary care team collaboration (see longitudinal plan of care) Review of patient status, including review of consultant's reports, relevant laboratory and other test results, and medications completed. Educated patient on signs/symptoms of UTI and when to notify MD of new or worsening symptoms  Reviewed scheduled/upcoming provider appointments including: next PCP follow up appointment to review imaging results, scheduled for today, 12/01/20 _0 :36 PM  Discussed plans with patient for ongoing care management follow up and provided patient with direct contact information for care management team Self Care Activities:  Patient verbalizes understanding of plan to follow up with PCP provider [name] FNP this afternoon _1 :12 PM to review imaging and discuss next steps  Patient Goals: - follow MD recommendations  - contact MD for new or worsening symptoms as directed   Follow Up Plan: Telephone follow up appointment with care management team member scheduled for: 02/16/21    Plan:Telephone follow up appointment with care management team member scheduled for:  02/16/21  [name], RN, BSN, CCM Care Management Coordinator [facility]  Direct Phone: [phone]

## 2020-12-14 ENCOUNTER — Ambulatory Visit: Payer: Medicare Other | Admitting: Dermatology

## 2020-12-16 DIAGNOSIS — N202 Calculus of kidney with calculus of ureter: Secondary | ICD-10-CM | POA: Diagnosis not present

## 2020-12-21 ENCOUNTER — Other Ambulatory Visit: Payer: Medicare Other

## 2020-12-22 ENCOUNTER — Telehealth: Payer: Self-pay

## 2020-12-22 NOTE — Chronic Care Management (AMB) (Addendum)
Chronic Care Management Pharmacy Assistant   Name: Joanna Sandoval  MRN: [id number] DOB: 09-30-1945   Reason for Encounter: Chart review for CPP visit on 12-28-2020   Conditions to be addressed/monitored: HTN and DMII  Recent office visits:  12-01-2020 [name], FNP. RBC= 3.14, Hemo= 9.7, Hema= 29.9, EOS Absolute= 0.5. Referral placed to urology.  12-01-2020 Delsa Sale, RN (CCM)  11-10-2020 Delsa Sale, RN (CCM)  11-08-2020 [name], FNP. Shingrix given. Glucose= 115, Sodium= 146, Alkaline phosphate= 114. A1C= 7.4  08-24-2020 [place], [place], [place]. TB skin test  08-18-2020 [name], LPN. Medicare wellness.  08-05-2020 Delsa Sale, RN (CCM)  08-04-2020 [name], FNP. WBC= 11.9, RDW= 18.1. Sodium= 146, Alkaline Phosphate= 137. A1C= 6.7. Spec Grav UA==1.030. Vit D 25= 27.7. Pneumococcal given.  07-07-2020 Charlesetta Ivory, NP. MM Diag breast TOMO bilateral preformed. Referral placed to dermatology.  Recent consult visits:  07-26-2020 Rinaldo Cloud, MD (Cardiology). Unable to view encounter.  Hospital visits:  Medication Reconciliation was completed by comparing discharge summary, patient's EMR and Pharmacy list, and upon discussion with patient.  Admitted to the hospital on 11-29-2020 due to nausea, vomiting and diarrhea. Discharge date was 11-29-2020. Discharged from [facility].    New?Medications Started at [facility] Discharge:?? Zofran 4 mg every 8 hours as needed  Medication Changes at Hospital Discharge: None  Medications Discontinued at Hospital Discharge: None  Medications that remain the same after Hospital Discharge:??  -All other medications will remain the same.    Medications: Outpatient Encounter Medications as of 12/22/2020  Medication Sig   Accu-Chek Softclix Lancets lancets USE AS DIRECTED   albuterol (PROVENTIL HFA;VENTOLIN HFA) 108 (90 Base) MCG/ACT inhaler Inhale 2 puffs into the lungs every 6 (six)  hours as needed.   atenolol (TENORMIN) 100 MG tablet Take 50 mg by mouth daily.    atorvastatin (LIPITOR) 20 MG tablet Take 1 tablet (20 mg total) by mouth daily.   Blood Glucose Monitoring Suppl (ACCU-CHEK GUIDE) w/Device KIT USE TWICE DAILY AT 10AM AND 5PM   carboxymethylcellulose (REFRESH PLUS) 0.5 % SOLN 1 drop 3 (three) times daily as needed.   D3-50 50000 units capsule    diclofenac sodium (VOLTAREN) 1 % GEL Apply 2 g topically 4 (four) times daily.   glimepiride (AMARYL) 2 MG tablet TAKE 1 TABLET BY MOUTH IN  THE MORNING AND AT BEDTIME   glucose blood (ACCU-CHEK GUIDE) test strip Use as instructed   [name] KIT Use to test blood sugar twice daily as directed. E11.65   latanoprost (XALATAN) 0.005 % ophthalmic solution 1 drop at bedtime.   losartan (COZAAR) 50 MG tablet Take 50 mg by mouth daily.   Magnesium 200 MG TABS Take 1 tab by mouth daily with evening meal   naproxen (NAPROSYN) 500 MG tablet Take 1 tablet (500 mg total) by mouth 2 (two) times daily.   ondansetron (ZOFRAN ODT) 4 MG disintegrating tablet Take 1 tablet (4 mg total) by mouth every 8 (eight) hours as needed for nausea or vomiting.   Semaglutide (RYBELSUS) 7 MG TABS Take 1 tablet by mouth daily.   No facility-administered encounter medications on file as of 12/22/2020.   Have you seen any other providers since your last visit? Patient states she saw a urologist on 12-16-2020 for kidney stones. Next appointment 12-30-2020.  Any changes in your medications or health? Patient stated no  Any side effects from any medications? Patient states she is still experiencing stomach pain and feels  it's from the Rybelsus.  Do you have an symptoms or problems not managed by your medications? Patient states kidney stones   Any concerns about your health right now? Patient states getting kidney stones under control.  Has your provider asked that you check blood pressure, blood sugar, or follow  special diet at home? Patient states she checks blood sugar.  Do you get any type of exercise on a regular basis? Patient states she tries to walk daily with her cane.  Can you think of a goal you would like to reach for your health? Patient states she wants to stay active. Patient states she had cataract surgery which holds her back from doing certain things.   Do you have any problems getting your medications? Patient states no  Is there anything that you would like to discuss during the appointment? Getting kidney stones under control.  Please bring medications and supplements to appointment  NOTES: Patient states the urologist didn't give her any education on preventing kidney stones nor did they provide a strainer. Patient was told to take ibuprofen for pain and to watch if she passes the stone. Encouraged patient to ask for helpful material that would help her manage the stone. Gave patient advice on what foods to avoid mainly red meat and oxalate containing foods (spinach, chocolate, tea, beets, nuts) and to drink plenty of water.  Care Gaps: Covid booster overdue Medicare wellness 08-31-2021  Star Rating Drugs: Rybelsus 7 mg-  Last filled 11-08-2020 [facility] Glimepiride 2 mg- Last filled 11-20-2020 [facility] Atorvastatin 20 mg- Last filled 11-25-2020 [facility] Losartan 50 mg- Last filled 11-25-2020 [facility] Clinical Pharmacist Assistant [phone]

## 2020-12-23 ENCOUNTER — Other Ambulatory Visit: Payer: Medicare Other

## 2020-12-23 ENCOUNTER — Other Ambulatory Visit: Payer: Self-pay

## 2020-12-23 DIAGNOSIS — D649 Anemia, unspecified: Secondary | ICD-10-CM | POA: Diagnosis not present

## 2020-12-24 LAB — CBC WITH DIFFERENTIAL/PLATELET
Basophils Absolute: 0.1 10*3/uL (ref 0.0–0.2)
Basos: 1 %
EOS (ABSOLUTE): 1.2 10*3/uL — ABNORMAL HIGH (ref 0.0–0.4)
Eos: 9 %
Hematocrit: 40.2 % (ref 34.0–46.6)
Hemoglobin: 12.8 g/dL (ref 11.1–15.9)
Immature Grans (Abs): 0.1 10*3/uL (ref 0.0–0.1)
Immature Granulocytes: 1 %
Lymphocytes Absolute: 5.2 10*3/uL — ABNORMAL HIGH (ref 0.7–3.1)
Lymphs: 39 %
MCH: 27.8 pg (ref 26.6–33.0)
MCHC: 31.8 g/dL (ref 31.5–35.7)
MCV: 87 fL (ref 79–97)
Monocytes Absolute: 1.2 10*3/uL — ABNORMAL HIGH (ref 0.1–0.9)
Monocytes: 9 %
Neutrophils Absolute: 5.5 10*3/uL (ref 1.4–7.0)
Neutrophils: 41 %
Platelets: 268 10*3/uL (ref 150–450)
RBC: 4.6 x10E6/uL (ref 3.77–5.28)
RDW: 18.8 % — ABNORMAL HIGH (ref 11.7–15.4)
WBC: 13.3 10*3/uL — ABNORMAL HIGH (ref 3.4–10.8)

## 2020-12-28 ENCOUNTER — Telehealth: Payer: Medicare Other

## 2020-12-28 ENCOUNTER — Ambulatory Visit (INDEPENDENT_AMBULATORY_CARE_PROVIDER_SITE_OTHER): Payer: Medicare Other

## 2020-12-28 DIAGNOSIS — I700 Atherosclerosis of aorta: Secondary | ICD-10-CM

## 2020-12-28 DIAGNOSIS — E119 Type 2 diabetes mellitus without complications: Secondary | ICD-10-CM

## 2020-12-28 NOTE — Progress Notes (Signed)
Chronic Care Management Pharmacy Note  01/06/2021 Name:  Blima Jaimes MRN:  [id number] DOB:  [birth year]  Summary: Patients reports taking her medication on  a schedule.    Recommendations/Changes made from today's visit: Recommend patients atorvastatin 20 mg be increaseD to 40 mg.   Plan: Patient to start taking two pills of 20 mg tablets and then start taking one tablet of 40 mg once per day.    Subjective: Louis Gaw is an 75 y.o. year old female who is a primary patient of [name], [place].  The CCM team was consulted for assistance with disease management and care coordination needs.    Engaged with patient by telephone for initial visit in response to provider referral for pharmacy case management and/or care coordination services. Patient reports that she has family who is able to help her but her kidney stones have been a huge stressor. She is going to a kidney specialist and they are out of network and she pays a copay. She has to take a cab to the appointments.  She has support from her family, but things are very expensive. She had to get an ambulance to carry her to the hospital, and she is trying to get on section 8 to help with her rent, because the cost of things are too high. Her son is a Administrator and helps her when he can. She is grateful for the help but she needs more support. She is paying over $400 dollars for her one bedroom apartment and she is worried that she is going to go back to work. She likes her own comfort zone and privacy and she needs her own space. 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  Her father passed when he was 63 years old from old age. She does have food stamps which are helpful. Her goal is to maintain her independence and not move in with her son and daughter in law.   Consent to Services:  The patient was given the following information about Chronic Care  Management services today, agreed to services, and gave verbal consent: 1. CCM service includes personalized support from designated clinical staff supervised by the primary care provider, including individualized plan of care and coordination with other care providers 2. 24/7 contact phone numbers for assistance for urgent and routine care needs. 3. Service will only be billed when office clinical staff spend 20 minutes or more in a month to coordinate care. 4. Only one practitioner may furnish and bill the service in a calendar month. 5.The patient may stop CCM services at any time (effective at the end of the month) by phone call to the office staff. 6. The patient will be responsible for cost sharing (co-pay) of up to 20% of the service fee (after annual deductible is met). Patient agreed to services and consent obtained.  Patient Care Team: [name], [place] as PCP - General (General Practice) Rex Kras Claudette Stapler, RN as Case Manager Mayford Knife, [facility] (Pharmacist)  Recent office visits:  12-01-2020 [name], [place]. RBC= 3.14, Hemo= 9.7, Hema= 29.9, EOS Absolute= 0.5. Referral placed to urology.   12-01-2020 [name], RN (CCM)   11-10-2020 [name], RN (CCM)   11-08-2020 [name], [place]. Shingrix given. Glucose= 115, Sodium= 146, Alkaline phosphate= 114. A1C= 7.4   08-24-2020 [place], [place], [place]. TB skin test   08-18-2020 Zenia Resides,  Marissa Calamity, LPN. Medicare wellness.   08-05-2020 [name], RN (CCM)   08-04-2020 [name], [place]. WBC= 11.9, RDW= 18.1. Sodium= 146, Alkaline Phosphate= 137. A1C= 6.7. Spec Grav UA==1.030. Vit D 25= 27.7. Pneumococcal given.   07-07-2020 Bary Castilla, NP. MM Diag breast TOMO bilateral preformed. Referral placed to dermatology.   Recent consult visits:  07-26-2020 Charolette Forward, MD (Cardiology). Unable to view encounter.   Hospital visits:  Medication Reconciliation was completed by comparing discharge summary, patient's  EMR and Pharmacy list, and upon discussion with patient.   Admitted to the hospital on 11-29-2020 due to nausea, vomiting and diarrhea. Discharge date was 11-29-2020. Discharged from [place]?Medications Started at [facility] Discharge:?? Zofran 4 mg every 8 hours as needed   Medication Changes at Hospital Discharge: None   Medications Discontinued at Hospital Discharge: None   Medications that remain the same after Hospital Discharge:??  -All other medications will remain the same.     Objective:  Lab Results  Component Value Date   CREATININE 0.97 11/29/2020   BUN 19 11/29/2020   GFRNONAA >60 11/29/2020   GFRAA 70 01/29/2020   NA 140 11/29/2020   K 4.3 11/29/2020   CALCIUM 9.4 11/29/2020   CO2 29 11/29/2020   GLUCOSE 196 (H) 11/29/2020    Lab Results  Component Value Date/Time   HGBA1C 7.4 (H) 11/08/2020 09:13 AM   HGBA1C 6.7 (H) 08/04/2020 10:13 AM   MICROALBUR 30 08/04/2020 09:46 AM   MICROALBUR 30 07/30/2019 10:58 AM    Last diabetic Eye exam:  Lab Results  Component Value Date/Time   HMDIABEYEEXA No Retinopathy 10/12/2020 12:00 AM    Last diabetic Foot exam: No results found for: HMDIABFOOTEX   Lab Results  Component Value Date   CHOL 148 11/08/2020   HDL 48 11/08/2020   LDLCALC 78 11/08/2020   TRIG 122 11/08/2020   CHOLHDL 3.1 11/08/2020    Hepatic Function Latest Ref Rng & Units 11/29/2020 11/08/2020 08/04/2020  Total Protein 6.5 - 8.1 g/dL 7.1 6.9 7.1  Albumin 3.5 - 5.0 g/dL 3.6 4.3 4.3  AST 15 - 41 U/L $Remo'17 13 13  'RVeab$ ALT 0 - 44 U/L $Remo'18 10 9  'oSdcy$ Alk Phosphatase 38 - 126 U/L 106 144(H) 137(H)  Total Bilirubin 0.3 - 1.2 mg/dL 1.0 0.7 0.5  Bilirubin, Direct - - - -    Lab Results  Component Value Date/Time   TSH 0.721 01/29/2020 09:35 AM   TSH 0.490 07/30/2019 10:59 AM    CBC Latest Ref Rng & Units 12/23/2020 12/01/2020 11/29/2020  WBC 3.4 - 10.8 x10E3/uL 13.3(H) 10.2 11.3(H)  Hemoglobin 11.1 - 15.9 g/dL 12.8 9.7(L) 12.5  Hematocrit  34.0 - 46.6 % 40.2 29.9(L) 39.1  Platelets 150 - 450 x10E3/uL 268 450 249    Lab Results  Component Value Date/Time   VD25OH 41.2 11/08/2020 09:13 AM   VD25OH 27.7 (L) 08/04/2020 10:13 AM    Clinical ASCVD: Yes  The 10-year ASCVD risk score (Arnett DK, et al., 2019) is: 19.2%   Values used to calculate the score:     Age: 70 years     Sex: Female     Is Non-Hispanic African American: Yes     Diabetic: Yes     Tobacco smoker: No     Systolic Blood Pressure: 286 mmHg     Is BP treated: Yes     HDL Cholesterol: 48 mg/dL     Total Cholesterol: 148 mg/dL  Depression screen [facility] 2/9 11/08/2020 08/18/2020 07/07/2020  Decreased Interest 0 0 0  Down, Depressed, Hopeless 0 0 0  PHQ - 2 Score 0 0 0  Altered sleeping 0 - 0  Tired, decreased energy 0 - 0  Change in appetite 0 - 0  Feeling bad or failure about yourself  0 - 0  Trouble concentrating 0 - 0  Moving slowly or fidgety/restless 0 - 0  Suicidal thoughts 0 - 0  PHQ-9 Score 0 - 0  Difficult doing work/chores Not difficult at all - -  Some recent data might be hidden      Social History   Tobacco Use  Smoking Status Former   Types: Cigarettes  Smokeless Tobacco Never   BP Readings from Last 3 Encounters:  12/01/20 110/74  11/29/20 (!) 151/88  11/08/20 134/80   Pulse Readings from Last 3 Encounters:  12/01/20 92  11/29/20 90  11/08/20 (!) 102   Wt Readings from Last 3 Encounters:  12/01/20 210 lb 12.8 oz (95.6 kg)  11/29/20 206 lb (93.4 kg)  11/08/20 206 lb (93.4 kg)   BMI Readings from Last 3 Encounters:  12/01/20 38.56 kg/m  11/29/20 37.68 kg/m  11/08/20 37.68 kg/m    Assessment/Interventions: Review of patient past medical history, allergies, medications, health status, including review of consultants reports, laboratory and other test data, was performed as part of comprehensive evaluation and provision of chronic care management services.   SDOH:  (Social Determinants of Health) assessments and  interventions performed: Yes SDOH Interventions    Flowsheet Row Most Recent Value  SDOH Interventions   Financial Strain Interventions Other (Comment)      SDOH Screenings   Alcohol Screen: Not on file  Depression (PHQ2-9): Low Risk    PHQ-2 Score: 0  Financial Resource Strain: High Risk   Difficulty of Paying Living Expenses: Very hard  Food Insecurity: No Food Insecurity   Worried About Programme researcher, broadcasting/film/video in the Last Year: Never true   Ran Out of Food in the Last Year: Never true  Housing: Not on file  Physical Activity: Insufficiently Active   Days of Exercise per Week: 4 days   Minutes of Exercise per Session: 30 min  Social Connections: Not on file  Stress: No Stress Concern Present   Feeling of Stress : Only a little  Tobacco Use: Medium Risk   Smoking Tobacco Use: Former   Smokeless Tobacco Use: Never  Transportation Needs: No Regulatory affairs officer (Medical): No   Lack of Transportation (Non-Medical): No    CCM Care Plan  No Known Allergies  Medications Reviewed Today     Reviewed by Harlan Stains, [facility] (Pharmacist) on 12/28/20 at [phone]  Med List Status: <None>   Medication Order Taking? Sig Documenting Provider Last Dose Status Informant  Accu-Chek Softclix Lancets lancets [id number] No USE AS DIRECTED [name], FNP Taking Active   albuterol (PROVENTIL HFA;VENTOLIN HFA) 108 (90 Base) MCG/ACT inhaler [id number] No Inhale 2 puffs into the lungs every 6 (six) hours as needed. [provider] Taking Active   atenolol (TENORMIN) 100 MG tablet [id number] No Take 50 mg by mouth daily.  [provider] Taking Active   atorvastatin (LIPITOR) 20 MG tablet [id number]  Take 1 tablet (20 mg total) by mouth daily. [name], FNP  Active   Blood Glucose Monitoring Suppl (ACCU-CHEK GUIDE) w/Device KIT [id number] No USE TWICE DAILY AT 10AM AND 5PM [name], FNP Taking Active  carboxymethylcellulose (REFRESH PLUS) 0.5 % SOLN  [id number] No 1 drop 3 (three) times daily as needed. [provider] Taking Active Self  D3-50 50000 units capsule [id number] No  [provider] Taking Active   diclofenac sodium (VOLTAREN) 1 % GEL [id number] No Apply 2 g topically 4 (four) times daily. [name], FNP Taking Active   glimepiride (AMARYL) 2 MG tablet [id number] No TAKE 1 TABLET BY MOUTH IN  THE MORNING AND AT BEDTIME [name], FNP Taking Active   glucose blood (ACCU-CHEK GUIDE) test strip [id number] No Use as instructed [name], FNP Taking Active   [name] KIT [id number] No Use to test blood sugar twice daily as directed. E11.65 [name], FNP Taking Active   latanoprost (XALATAN) 0.005 % ophthalmic solution [id number] No 1 drop at bedtime. [provider] Taking Active   losartan (COZAAR) 50 MG tablet [id number] No Take 50 mg by mouth daily. [provider] Taking Active   Magnesium 200 MG TABS [id number] No Take 1 tab by mouth daily with evening meal [name], FNP Taking Active   naproxen (NAPROSYN) 500 MG tablet [id number] No Take 1 tablet (500 mg total) by mouth 2 (two) times daily. Marney Setting, NP Taking Active   ondansetron (ZOFRAN ODT) 4 MG disintegrating tablet [id number] No Take 1 tablet (4 mg total) by mouth every 8 (eight) hours as needed for nausea or vomiting. Alroy Bailiff, [place], PA-C Taking Active   Semaglutide (RYBELSUS) 7 MG TABS [id number] No Take 1 tablet by mouth daily. [name], FNP Taking Active             Patient Active Problem List   Diagnosis Date Noted   Colon cancer screening 05/05/2020   Morbid obesity ([place]) 05/05/2020   Type 2 diabetes mellitus without complication, without long-term current use of insulin ([place]) 07/31/2018   Essential hypertension 04/30/2018   Fibroid 11/11/2011    Immunization History  Administered Date(s) Administered   Fluad Quad(high Dose 65+) 01/29/2020   Influenza, High Dose  Seasonal PF 01/16/2018   Influenza,inj,Quad PF,6+ Mos 01/12/2019   PFIZER(Purple Top)SARS-COV-2 Vaccination 05/25/2019, 06/18/2019, 02/14/2020   Pneumococcal Polysaccharide-23 08/04/2020   Tdap 10/06/2018    Conditions to be addressed/monitored:  Hyperlipidemia and Diabetes  Care Plan : [place]  Updates made by Mayford Knife, [place] since 01/05/2021 12:00 AM     Problem: DM, HLD   Priority: High     Long-Range Goal: Disease Management   This Visit's Progress: On track  Priority: High  Note:     Current Barriers:  Unable to independently monitor therapeutic efficacy Does not maintain contact with provider office Does not contact provider office for questions/concerns  Pharmacist Clinical Goal(s):  Patient will achieve adherence to monitoring guidelines and medication adherence to achieve therapeutic efficacy through collaboration with PharmD and provider.   Interventions: 1:1 collaboration with [name], FNP regarding development and update of comprehensive plan of care as evidenced by provider attestation and co-signature Inter-disciplinary care team collaboration (see longitudinal plan of care) Comprehensive medication review performed; medication list updated in electronic medical record   Atherosclerosis of Aorta: (LDL goal < 70) -Controlled -Current treatment: Atorvastatin 20 mg tablet once per day  Atenolol 100 mg tablet once per day  Losartan 50 mg tablet once per day  -Current dietary patterns: avoiding fried and fatty foods. -Current exercise habits: please see diabetes  -Educated on Cholesterol goals;  Benefits of statin for ASCVD risk reduction; Importance of  limiting foods high in cholesterol; Exercise goal of 150 minutes per week; -Recommended to continue current medication -Collaborate with PCP team to increase patients medication to Atorvastatin 40 mg tablet . Patient reports having plenty of medication (Atorvastatin 20 mg - for now  patient can take 2 tablets daily. )  Diabetes (A1c goal <8%) -Controlled -Current medications: Rybelsus 7mg  tablet once per day  Glimepiride 2 mg tablet once per day  -Medications previously tried: [name] 100 mg tablet, Rybelsus 14 mg tablet,   -Current home glucose readings: patient is checking her BS twice per day fasting glucose: 125 - 180  post prandial glucose:  -Denies hypoglycemic/hyperglycemic symptoms -Current meal patterns:  breakfast: oatmeal and toast with strawberry jelly, or strawberries and waffles  lunch: she does not eat lunch  dinner: baked chicken, eats a lot of vegetables, and beef hamburger, she eats plenty of mixed fruits  snacks: she tries to avoid sweets but she still has them  drinks: lemonade with a lot of ice,[place] brand, she also drinks about five bottles of water per day  -Current exercise: she walks at the back of her apartment complex for about 30 minutes with her cane, and she does it about four times per week.  -Educated on A1c and blood sugar goals; Exercise goal of 150 minutes per week; Benefits of routine self-monitoring of blood sugar; -Counseled to check feet daily and get yearly eye exams -Counseled on diet and exercise extensively Recommended to continue current medication  Health Maintenance -Vaccine gaps: Shingrix Vaccine, COVID-19 Booster vaccine , influenza vaccine   -COVID-19 Booster series: patient has already received:05/25/2019, 06/18/2019- [place] Booster: 02/14/2020 Second Booster, 09/18/2020 - [facility]   -Patient reports shingrix vaccine is $9  -Influenza vaccine   -Follow up on Pneumonia vaccine -Patient has 2 pill boxes one is for home and one is for traveling just in case.  -She also makes sure to keep her diabetic supplies close by as well.  -Collaborate with PCP team for patient have referral with SW, to help with the cost of her rent, and other resources that might need.   Patient Goals/Self-Care Activities Patient  will:  - take medications as prescribed  Follow Up Plan: The patient has been provided with contact information for the care management team and has been advised to call with any health related questions or concerns.        Medication Assistance: None required.  Patient affirms current coverage meets needs.  Compliance/Adherence/Medication fill history: Care Gaps: Shingrix vaccine COVID-19 Booster Influenza Vaccine   Star-Rating Drugs: Atorvastatin 20 mg tablet Glimepiride 2 mg tablet Losartan 50 mg tablet Semaglutide 7 mg tablet   Patient's preferred pharmacy is:  [facility], [place] [address] [place] [address] Phone: [phone] Fax: [phone]  [facility]  ([place]) - [place], [facility] [address] [place] [address] Phone: [phone] Fax: [phone]  Uses pill box? Yes Pt endorses 95% compliance  We discussed: Current pharmacy is preferred with insurance plan and patient is satisfied with pharmacy services Patient decided to: Continue current medication management strategy  Care Plan and Follow Up Patient Decision:  Patient agrees to Care Plan and Follow-up.  Plan: The patient has been provided with contact information for the care management team and has been advised to call with any health related questions or concerns.  [name], PharmD Clinical Pharmacist [facility] [phone]

## 2020-12-30 DIAGNOSIS — N202 Calculus of kidney with calculus of ureter: Secondary | ICD-10-CM | POA: Diagnosis not present

## 2021-01-03 ENCOUNTER — Other Ambulatory Visit: Payer: Self-pay | Admitting: Nurse Practitioner

## 2021-01-03 MED ORDER — AMOXICILLIN 875 MG PO TABS: 875.0000 mg | ORAL_TABLET | Freq: Two times a day (BID) | ORAL | 0 refills | Status: DC

## 2021-01-03 NOTE — Progress Notes (Signed)
I am sending her an antibiotic to take 2 times a day after she has completed the 7 days she needs to come back to recheck her CBC with diff.

## 2021-01-06 NOTE — Patient Instructions (Signed)
Visit Information It was great speaking with you today!  Please let me know if you have any questions about our visit.   Goals Addressed             This Visit's Progress    Manage My Medicine       Timeframe:  Long-Range Goal Priority:  Medium Start Date:                             Expected End Date:                       Follow Up Date 04/26/2021    - call for medicine refill 2 or 3 days before it runs out - call if I am sick and can't take my medicine - keep a list of all the medicines I take; vitamins and herbals too - use a pillbox to sort medicine    Why is this important?   These steps will help you keep on track with your medicines.   Notes:  Please call if you have any questions.         Patient Care Plan: CCM Pharmacy Care Plan     Problem Identified: DM, HLD   Priority: High     Long-Range Goal: Disease Management   This Visit's Progress: On track  Priority: High  Note:     Current Barriers:  Unable to independently monitor therapeutic efficacy Does not maintain contact with provider office Does not contact provider office for questions/concerns  Pharmacist Clinical Goal(s):  Patient will achieve adherence to monitoring guidelines and medication adherence to achieve therapeutic efficacy through collaboration with Joanna Sandoval and provider.   Interventions: 1:1 collaboration with [name], FNP regarding development and update of comprehensive plan of care as evidenced by provider attestation and co-signature Inter-disciplinary care team collaboration (see longitudinal plan of care) Comprehensive medication review performed; medication list updated in electronic medical record    Atherosclerosis of Aorta: (LDL goal < 70) -Controlled -Current treatment: Atorvastatin 20 mg tablet once per day  Atenolol 100 mg tablet once per day  Losartan 50 mg tablet once per day  -Current dietary patterns: avoiding fried and fatty foods. -Current exercise  habits: please see diabetes  -Educated on Cholesterol goals;  Importance of limiting foods high in cholesterol; Exercise goal of 150 minutes per week; -Recommended to continue current medication -Collaborate with PCP team to increase patients medication to Atorvastatin 40 mg tablet . Patient reports having plenty of medication (Atorvastatin 20 mg - for now patient can take 2 tablets daily. )  Diabetes (A1c goal <8%) -Controlled -Current medications: Rybelsus 7mg  tablet once per day  Glimepiride 2 mg tablet once per day  -Medications previously tried: [name] 100 mg tablet, Rybelsus 14 mg tablet,   -Current home glucose readings: patient is checking her BS twice per day fasting glucose: 125 - 180  post prandial glucose:  -Denies hypoglycemic/hyperglycemic symptoms -Current meal patterns:  breakfast: oatmeal and toast with strawberry jelly, or strawberries and waffles  lunch: she does not eat lunch  dinner: baked chicken, eats a lot of vegetables, and beef hamburger, she eats plenty of mixed fruits  snacks: she tries to avoid sweets but she still has them  drinks: lemonade with a lot of ice,[place] brand, she also drinks about five bottles of water per day  -Current exercise: she walks at the back of her apartment complex for about 30  minutes with her cane, and she does it about four times per week.  -Educated on A1c and blood sugar goals; Exercise goal of 150 minutes per week; Benefits of routine self-monitoring of blood sugar; -Counseled to check feet daily and get yearly eye exams -Counseled on diet and exercise extensively Recommended to continue current medication  Health Maintenance -Vaccine gaps: Shingrix Vaccine, COVID-19 Booster vaccine , influenza vaccine   -COVID-19 Booster series: patient has already received:05/25/2019, 06/18/2019- [place] Booster: 02/14/2020 Second Booster, 09/18/2020 - [facility]   -Patient reports shingrix vaccine is $9  -Influenza vaccine   -Follow  up on Pneumonia vaccine -Patient has 2 pill boxes one is for home and one is for traveling just in case.  -She also makes sure to keep her diabetic supplies close by as well.  -Collaborate with PCP team for patient have referral with SW, to help with the cost of her rent, and other resources that might need.   Patient Goals/Self-Care Activities Patient will:  - take medications as prescribed  Follow Up Plan: The patient has been provided with contact information for the care management team and has been advised to call with any health related questions or concerns.         [name] was given information about Chronic Care Management services today including:  CCM service includes personalized support from designated clinical staff supervised by her physician, including individualized plan of care and coordination with other care providers 24/7 contact phone numbers for assistance for urgent and routine care needs. Standard insurance, coinsurance, copays and deductibles apply for chronic care management only during months in which we provide at least 20 minutes of these services. Most insurances cover these services at 100%, however patients may be responsible for any copay, coinsurance and/or deductible if applicable. This service may help you avoid the need for more expensive face-to-face services. Only one practitioner may furnish and bill the service in a calendar month. The patient may stop CCM services at any time (effective at the end of the month) by phone call to the office staff.  Patient agreed to services and verbal consent obtained.   The patient verbalized understanding of instructions, educational materials, and care plan provided today and agreed to receive a mailed copy of patient instructions, educational materials, and care plan.   Joanna Sandoval, Joanna Sandoval Clinical Pharmacist [facility] [phone]

## 2021-01-14 DIAGNOSIS — E119 Type 2 diabetes mellitus without complications: Secondary | ICD-10-CM

## 2021-02-01 ENCOUNTER — Other Ambulatory Visit: Payer: Self-pay

## 2021-02-01 ENCOUNTER — Ambulatory Visit (INDEPENDENT_AMBULATORY_CARE_PROVIDER_SITE_OTHER): Payer: Medicare Other

## 2021-02-01 VITALS — BP 132/80 | HR 91 | Temp 98.6°F | Ht 62.0 in | Wt 208.0 lb

## 2021-02-01 DIAGNOSIS — Z23 Encounter for immunization: Secondary | ICD-10-CM

## 2021-02-01 NOTE — Progress Notes (Signed)
The patient is here today for a flu vaccination.

## 2021-02-14 ENCOUNTER — Other Ambulatory Visit: Payer: Self-pay

## 2021-02-14 ENCOUNTER — Encounter: Payer: Self-pay | Admitting: Nurse Practitioner

## 2021-02-14 ENCOUNTER — Ambulatory Visit (INDEPENDENT_AMBULATORY_CARE_PROVIDER_SITE_OTHER): Payer: Medicare Other | Admitting: Nurse Practitioner

## 2021-02-14 VITALS — BP 124/78 | HR 97 | Temp 97.9°F | Ht 62.0 in | Wt 206.0 lb

## 2021-02-14 DIAGNOSIS — I700 Atherosclerosis of aorta: Secondary | ICD-10-CM

## 2021-02-14 DIAGNOSIS — Z6837 Body mass index (BMI) 37.0-37.9, adult: Secondary | ICD-10-CM

## 2021-02-14 DIAGNOSIS — I10 Essential (primary) hypertension: Secondary | ICD-10-CM | POA: Diagnosis not present

## 2021-02-14 DIAGNOSIS — E119 Type 2 diabetes mellitus without complications: Secondary | ICD-10-CM | POA: Diagnosis not present

## 2021-02-14 DIAGNOSIS — E6609 Other obesity due to excess calories: Secondary | ICD-10-CM | POA: Diagnosis not present

## 2021-02-14 DIAGNOSIS — N644 Mastodynia: Secondary | ICD-10-CM

## 2021-02-14 DIAGNOSIS — E1139 Type 2 diabetes mellitus with other diabetic ophthalmic complication: Secondary | ICD-10-CM | POA: Diagnosis not present

## 2021-02-14 LAB — CMP14+EGFR
ALT: 9 IU/L (ref 0–32)
AST: 13 IU/L (ref 0–40)
Albumin/Globulin Ratio: 1.4 (ref 1.2–2.2)
Albumin: 4.3 g/dL (ref 3.7–4.7)
Alkaline Phosphatase: 122 IU/L — ABNORMAL HIGH (ref 44–121)
BUN/Creatinine Ratio: 23 (ref 12–28)
BUN: 19 mg/dL (ref 8–27)
Bilirubin Total: 0.5 mg/dL (ref 0.0–1.2)
CO2: 25 mmol/L (ref 20–29)
Calcium: 9.6 mg/dL (ref 8.7–10.3)
Chloride: 105 mmol/L (ref 96–106)
Creatinine, Ser: 0.83 mg/dL (ref 0.57–1.00)
Globulin, Total: 3 g/dL (ref 1.5–4.5)
Glucose: 114 mg/dL — ABNORMAL HIGH (ref 70–99)
Potassium: 4.3 mmol/L (ref 3.5–5.2)
Sodium: 145 mmol/L — ABNORMAL HIGH (ref 134–144)
Total Protein: 7.3 g/dL (ref 6.0–8.5)
eGFR: 74 mL/min/{1.73_m2} (ref >59)

## 2021-02-14 LAB — LIPID PANEL
Chol/HDL Ratio: 3.1 ratio (ref 0.0–4.4)
Cholesterol, Total: 147 mg/dL (ref 100–199)
HDL: 48 mg/dL (ref >39)
LDL Chol Calc (NIH): 72 mg/dL (ref 0–99)
Triglycerides: 158 mg/dL — ABNORMAL HIGH (ref 0–149)
VLDL Cholesterol Cal: 27 mg/dL (ref 5–40)

## 2021-02-14 LAB — HEMOGLOBIN A1C
Est. average glucose Bld gHb Est-mCnc: 174 mg/dL
Hgb A1c MFr Bld: 7.7 % — ABNORMAL HIGH (ref 4.8–5.6)

## 2021-02-14 NOTE — Patient Instructions (Signed)

## 2021-02-14 NOTE — Progress Notes (Signed)
Joanna Sandoval,acting as a Education administrator for Joanna Brine, FNP.,have documented all relevant documentation on the behalf of Joanna Brine, FNP,as directed by  Joanna Brine, FNP while in the presence of Joanna Sandoval, [place].  This visit occurred during the SARS-CoV-2 public health emergency.  Safety protocols were in place, including screening questions prior to the visit, additional usage of staff PPE, and extensive cleaning of exam room while observing appropriate contact time as indicated for disinfecting solutions.  Subjective:     Patient ID: Joanna Sandoval , female    DOB: Dec 06, 1945 , 75 y.o.   MRN: [id number]   Chief Complaint  Patient presents with   Diabetes   Hypertension    HPI  Patient is here today for diabetes and hypertension check. She is requesting a rollator seated walker due to concerns about falling. She feels like she needs for when she is going long distances. Sometimes when she is doing her walking she feels like she needs to sit down more.   Wt Readings from Last 3 Encounters: 02/14/21 : 206 lb (93.4 kg) 02/01/21 : 208 lb (94.3 kg) 12/01/20 : 210 lb 12.8 oz (95.6 kg)      Diabetes She presents for her follow-up diabetic visit. She has type 2 diabetes mellitus. Her disease course has been worsening. Pertinent negatives for hypoglycemia include no dizziness or headaches. Pertinent negatives for diabetes include no chest pain, no fatigue, no polydipsia, no polyphagia and no polyuria. Symptoms are resolved. There are no diabetic complications. Risk factors for coronary artery disease include obesity, hypertension, diabetes mellitus, dyslipidemia, sedentary lifestyle and post-menopausal. Current diabetic treatment includes oral agent (dual therapy). She is compliant with treatment most of the time. Her weight is stable. She is following a generally unhealthy diet. When asked about meal planning, she reported none. She has not had a previous visit with a dietitian. She  participates in exercise every other day. Her home blood glucose trend is decreasing steadily. (Reports her blood sugar has been from 120-140.  When she came to get her flu shot it was 236 after eating butter pecan ice cream. ) She does not see a podiatrist.Eye exam is current (done 09/22/2019 neg retinopathy).  Hypertension This is a chronic problem. The current episode started more than 1 year ago. The problem has been gradually improving since onset. The problem is controlled. Pertinent negatives include no chest pain, headaches or palpitations. There are no associated agents to hypertension. Risk factors for coronary artery disease include obesity, post-menopausal state, sedentary lifestyle and diabetes mellitus. Compliance problems include exercise.  There is no history of angina or kidney disease. There is no history of chronic renal disease or sleep apnea.    Past Medical History:  Diagnosis Date   Arthritis    Diabetes mellitus    Hypercholesteremia    Hypertension    Post-menopausal bleeding    Postmenopausal bleeding 08/30/2011   Normal EBX 09/2011.     Family History  Problem Relation Age of Onset   Hypertension Mother    Hypertension Father    Cancer Father        ?lung   Diabetes Sister      Current Outpatient Medications:    Accu-Chek Softclix Lancets lancets, USE AS DIRECTED, Disp: 200 each, Rfl: 3   albuterol (PROVENTIL HFA;VENTOLIN HFA) 108 (90 Base) MCG/ACT inhaler, Inhale 2 puffs into the lungs every 6 (six) hours as needed., Disp: , Rfl:    amoxicillin (AMOXIL) 875 MG tablet, Take 1 tablet (662  mg total) by mouth 2 (two) times daily., Disp: 14 tablet, Rfl: 0   atenolol (TENORMIN) 100 MG tablet, Take 50 mg by mouth daily. , Disp: , Rfl:    atorvastatin (LIPITOR) 20 MG tablet, Take 1 tablet (20 mg total) by mouth daily., Disp: 90 tablet, Rfl: 1   Blood Glucose Monitoring Suppl (ACCU-CHEK GUIDE) w/Device KIT, USE TWICE DAILY AT 10AM AND 5PM, Disp: 1 kit, Rfl: 1    carboxymethylcellulose (REFRESH PLUS) 0.5 % SOLN, 1 drop 3 (three) times daily as needed., Disp: , Rfl:    D3-50 50000 units capsule, , Disp: , Rfl: 0   diclofenac sodium (VOLTAREN) 1 % GEL, Apply 2 g topically 4 (four) times daily., Disp: 100 g, Rfl: 1   glimepiride (AMARYL) 2 MG tablet, TAKE 1 TABLET BY MOUTH IN  THE MORNING AND AT BEDTIME, Disp: 180 tablet, Rfl: 3   glucose blood (ACCU-CHEK GUIDE) test strip, Use as instructed, Disp: 100 each, Rfl: 12   [name] KIT, Use to test blood sugar twice daily as directed. E11.65, Disp: 1 kit, Rfl: 12   latanoprost (XALATAN) 0.005 % ophthalmic solution, 1 drop at bedtime., Disp: , Rfl:    losartan (COZAAR) 50 MG tablet, Take 50 mg by mouth daily., Disp: , Rfl:    Magnesium 200 MG TABS, Take 1 tab by mouth daily with evening meal, Disp: 30 tablet, Rfl: 2   naproxen (NAPROSYN) 500 MG tablet, Take 1 tablet (500 mg total) by mouth 2 (two) times daily., Disp: 30 tablet, Rfl: 0   ondansetron (ZOFRAN ODT) 4 MG disintegrating tablet, Take 1 tablet (4 mg total) by mouth every 8 (eight) hours as needed for nausea or vomiting., Disp: 20 tablet, Rfl: 0   Semaglutide (RYBELSUS) 7 MG TABS, Take 1 tablet by mouth daily., Disp: 90 tablet, Rfl: 1   No Known Allergies   Review of Systems  Constitutional: Negative.  Negative for fatigue.  HENT: Negative.    Eyes: Negative.   Respiratory: Negative.    Cardiovascular: Negative.  Negative for chest pain, palpitations and leg swelling.  Gastrointestinal:  Negative for abdominal distention, abdominal pain, constipation, diarrhea, nausea and vomiting.  Endocrine: Negative for polydipsia, polyphagia and polyuria.  Musculoskeletal: Negative.   Skin: Negative.   Neurological:  Negative for dizziness and headaches.  Psychiatric/Behavioral: Negative.      Today's Vitals   02/14/21 1038  BP: 124/78  Pulse: 97  Temp: 97.9 F (36.6 C)  Weight: 206 lb (93.4 kg)  Height: 5' 2" (1.575 m)   PainSc: 0-No pain   Body mass index is 37.68 kg/m.   Objective:  Physical Exam Vitals reviewed.  Constitutional:      General: She is not in acute distress.    Appearance: Normal appearance. She is obese.  HENT:     Head: Normocephalic.     Nose:     Comments: Deferred - masked    Mouth/Throat:     Comments: Deferred - masked Eyes:     Extraocular Movements: Extraocular movements intact.     Conjunctiva/sclera: Conjunctivae normal.     Pupils: Pupils are equal, round, and reactive to light.  Cardiovascular:     Rate and Rhythm: Normal rate and regular rhythm.     Pulses: Normal pulses.     Heart sounds: Normal heart sounds. No murmur heard. Pulmonary:     Effort: Pulmonary effort is normal. No respiratory distress.     Breath sounds: Normal breath sounds. No wheezing.  Chest:     Chest wall: No mass.  Breasts:    Tanner Score is 5.     Right: Normal. No tenderness.     Left: Tenderness (at 2 o'clock area) present.    Abdominal:     General: Abdomen is flat. Bowel sounds are normal. There is no distension.     Palpations: Abdomen is soft. There is no mass.     Tenderness: There is no abdominal tenderness.  Musculoskeletal:     Comments: Using a cane for support, sometimes will use a rollator walker  Skin:    General: Skin is warm and dry.     Capillary Refill: Capillary refill takes less than 2 seconds.  Neurological:     General: No focal deficit present.     Mental Status: She is alert and oriented to person, place, and time.     Cranial Nerves: No cranial nerve deficit.     Motor: No weakness.  Psychiatric:        Mood and Affect: Mood normal.        Behavior: Behavior normal.        Thought Content: Thought content normal.        Judgment: Judgment normal.        Assessment And Plan:     1. Type 2 diabetes mellitus other opthalmolgic complication, without long-term current use of insulin (HCC) Chronic, HgbA1c was worse at last visit.  Continue with  current medications Encouraged to limit intake of sugary foods and drinks Encouraged to increase physical activity to 150 minutes per week as tolerated - CMP14+EGFR - Hemoglobin A1c  2. Essential hypertension Comments: Blood pressure is well controlled  Continue current medications - CMP14+EGFR  3. Class 2 obesity due to excess calories with body mass index (BMI) of 37.0 to 37.9 in adult, unspecified whether serious comorbidity present Encouraged to continue with regular physical activity, will order rollator walker which will help with her ambulation and physical activity level  4. Morbid obesity ([place])  5. Aortic atherosclerosis (HCC) Comments: Continue current medications, tolerating statin well - Lipid panel  6. Breast tenderness Comments: She has had a diagnostic mammogram to this area was normal in 07/2020     Patient was given opportunity to ask questions. Patient verbalized understanding of the plan and was able to repeat key elements of the plan. All questions were answered to their satisfaction.  Joanna Brine, FNP   I, Joanna Brine, FNP, have reviewed all documentation for this visit. The documentation on 02/14/21 for the exam, diagnosis, procedures, and orders are all accurate and complete.   IF YOU HAVE BEEN REFERRED TO A SPECIALIST, IT MAY TAKE 1-2 WEEKS TO SCHEDULE/PROCESS THE REFERRAL. IF YOU HAVE NOT HEARD FROM US/SPECIALIST IN TWO WEEKS, PLEASE GIVE [place] A CALL AT [phone].   THE PATIENT IS ENCOURAGED TO PRACTICE SOCIAL DISTANCING DUE TO THE COVID-19 PANDEMIC.

## 2021-02-16 ENCOUNTER — Ambulatory Visit (INDEPENDENT_AMBULATORY_CARE_PROVIDER_SITE_OTHER): Payer: Medicare Other

## 2021-02-16 ENCOUNTER — Telehealth: Payer: Medicare Other

## 2021-02-16 DIAGNOSIS — E1139 Type 2 diabetes mellitus with other diabetic ophthalmic complication: Secondary | ICD-10-CM

## 2021-02-16 DIAGNOSIS — I700 Atherosclerosis of aorta: Secondary | ICD-10-CM

## 2021-02-16 DIAGNOSIS — N200 Calculus of kidney: Secondary | ICD-10-CM

## 2021-02-16 DIAGNOSIS — I10 Essential (primary) hypertension: Secondary | ICD-10-CM

## 2021-02-16 DIAGNOSIS — E559 Vitamin D deficiency, unspecified: Secondary | ICD-10-CM

## 2021-02-17 NOTE — Patient Instructions (Signed)
Visit Information   PATIENT GOALS/PLAN OF CARE:   Care Plan : RN Care Manager Plan of Care  Updates made by [name], RN since 02/16/2021 12:00 AM     Problem: No plan of care established for management of chronic disease states (Type 2 diabetes mellitus, Essential HTN, Vitamin D deficiency, Bilateral Nephrolithiasis, Aortic Atherosclerosis)   Priority: High     Long-Range Goal: Development of plan of care for chronic disease management for Type 2 diabetes mellitus, Essential HTN, Vitamin D deficiency, Bilateral Nephrolithiasis, Aortic Atherosclerosis   Start Date: 02/16/2021  Expected End Date: 02/16/2022  This Visit's Progress: On track  Priority: High  Note:   Current Barriers:  Knowledge Deficits related to plan of care for management of Type 2 diabetes mellitus, Essential HTN, Vitamin D deficiency, Bilateral Nephrolithiasis, Aortic Atherosclerosis  Chronic Disease Management support and education needs related to Type 2 diabetes mellitus, Essential HTN, Vitamin D deficiency, Bilateral Nephrolithiasis, Aortic Atherosclerosis  Financial Constraints  RNCM Clinical Goal(s):  Patient will verbalize basic understanding of  Type 2 diabetes mellitus, Essential HTN, Vitamin D deficiency, Bilateral Nephrolithiasis, Aortic Atherosclerosis  disease process and self health management plan   demonstrate Improved health management independence   continue to work with RN Care Manager to address care management and care coordination needs related to  Type 2 diabetes mellitus, Essential HTN, Vitamin D deficiency, Bilateral Nephrolithiasis, Aortic Atherosclerosis  will demonstrate ongoing self health care management ability    through collaboration with RN Care manager, provider, and care team.   Interventions: 1:1 collaboration with primary care provider regarding development and update of comprehensive plan of care as evidenced by provider attestation and co-signature Inter-disciplinary care  team collaboration (see longitudinal plan of care) Evaluation of current treatment plan related to  self management and patient's adherence to plan as established by provider  Hyperlipidemia Interventions: Medication review performed; medication list updated in electronic medical record.  Provider established cholesterol goals reviewed; Counseled on importance of regular laboratory monitoring as prescribed; Provided HLD educational materials; Reviewed importance of limiting foods high in cholesterol; Reviewed exercise goals and target of 150 minutes per week; Assessed social determinant of health barriers;  Goal on track:  Yes Evaluation of current treatment plan related to  Vitamin D deficiency , self-management and patient's adherence to plan as established by provider. Discussed plans with patient for ongoing care management follow up and provided patient with direct contact information for care management team Provided education to patient re: try to get at least 15 minutes of natural sunlight daily when possible; eat a Vitamin D rich diet ; Reviewed medications with patient and discussed importance of medication adherence, take Vitamin D supplement exactly as prescribed ; Discussed plans with patient for ongoing care management follow up and provided patient with direct contact information for care management team;  Diabetes Interventions: Assessed patient's understanding of A1c goal: <7% Provided education to patient about basic DM disease process; Reviewed medications with patient and discussed importance of medication adherence; Counseled on importance of regular laboratory monitoring as prescribed; Discussed plans with patient for ongoing care management follow up and provided patient with direct contact information for care management team; Provided patient with written educational materials related to hypo and hyperglycemia and importance of correct treatment; Advised patient,  providing education and rationale, to check cbg daily before meals and at bedtime and record, calling PCP and or RN CM for findings outside established parameters; Review of patient status, including review of consultants reports, relevant  laboratory and other test results, and medications completed; Assessed social determinant of health barriers;  Educated on dietary and exercise recommendations Mailed printed educational materials related to Diabetes Management  Lab Results  Component Value Date   HGBA1C 7.7 (H) 02/14/2021   Patient Goals/Self-Care Activities: Patient will self administer medications as prescribed Patient will attend all scheduled provider appointments Patient will call pharmacy for medication refills Patient will attend church or other social activities Patient will continue to perform ADL's independently Patient will continue to perform IADL's independently Patient will call provider office for new concerns or questions  Follow Up Plan:  Telephone follow up appointment with care management team member scheduled for:  04/20/21      Consent to CCM Services: Ms. Cowdrey was given information about Chronic Care Management services including:  CCM service includes personalized support from designated clinical staff supervised by her physician, including individualized plan of care and coordination with other care providers 24/7 contact phone numbers for assistance for urgent and routine care needs. Service will only be billed when office clinical staff spend 20 minutes or more in a month to coordinate care. Only one practitioner may furnish and bill the service in a calendar month. The patient may stop CCM services at any time (effective at the end of the month) by phone call to the office staff. The patient will be responsible for cost sharing (co-pay) of up to 20% of the service fee (after annual deductible is met).  Patient agreed to services and verbal consent  obtained.   The patient verbalized understanding of instructions, educational materials, and care plan provided today and declined offer to receive copy of patient instructions, educational materials, and care plan.   Telephone follow up appointment with care management team member scheduled for: 04/20/21  [name], RN, BSN, CCM Care Management Coordinator [facility]  Direct Phone: [phone]

## 2021-02-17 NOTE — Chronic Care Management (AMB) (Signed)
Chronic Care Management   CCM RN Visit Note  02/16/2021 Name: Joanna Sandoval MRN: [id number] DOB: [birth year]  Subjective: Joanna Sandoval is a 75 y.o. year old female who is a primary care patient of [name], [place]. The care management team was consulted for assistance with disease management and care coordination needs.    Engaged with patient by telephone for follow up visit in response to provider referral for case management and/or care coordination services.   Consent to Services:  The patient was given information about Chronic Care Management services, agreed to services, and gave verbal consent prior to initiation of services.  Please see initial visit note for detailed documentation.   Patient agreed to services and verbal consent obtained.   Assessment: Review of patient past medical history, allergies, medications, health status, including review of consultants reports, laboratory and other test data, was performed as part of comprehensive evaluation and provision of chronic care management services.   SDOH (Social Determinants of Health) assessments and interventions performed: yes, no acute challenges   SDOH Interventions    Flowsheet Row Most Recent Value  SDOH Interventions   Housing Interventions Intervention Not Indicated  Intimate Partner Violence Interventions Intervention Not Indicated  Physical Activity Interventions Other (Comments)  [Educated patient on recommendations for 150 minutes weekly]  Social Connections Interventions Intervention Not Indicated  Transportation Interventions Intervention Not Indicated        CCM Care Plan  No Known Allergies  Outpatient Encounter Medications as of 02/16/2021  Medication Sig   Accu-Chek Softclix Lancets lancets USE AS DIRECTED   albuterol (PROVENTIL HFA;VENTOLIN HFA) 108 (90 Base) MCG/ACT inhaler Inhale 2 puffs into the lungs every 6 (six) hours as needed.   amoxicillin (AMOXIL) 875 MG tablet Take 1 tablet  (875 mg total) by mouth 2 (two) times daily.   atenolol (TENORMIN) 100 MG tablet Take 50 mg by mouth daily.    atorvastatin (LIPITOR) 20 MG tablet Take 1 tablet (20 mg total) by mouth daily.   Blood Glucose Monitoring Suppl (ACCU-CHEK GUIDE) w/Device KIT USE TWICE DAILY AT 10AM AND 5PM   carboxymethylcellulose (REFRESH PLUS) 0.5 % SOLN 1 drop 3 (three) times daily as needed.   D3-50 50000 units capsule    diclofenac sodium (VOLTAREN) 1 % GEL Apply 2 g topically 4 (four) times daily.   glimepiride (AMARYL) 2 MG tablet TAKE 1 TABLET BY MOUTH IN  THE MORNING AND AT BEDTIME   glucose blood (ACCU-CHEK GUIDE) test strip Use as instructed   [name] KIT Use to test blood sugar twice daily as directed. E11.65   latanoprost (XALATAN) 0.005 % ophthalmic solution 1 drop at bedtime.   losartan (COZAAR) 50 MG tablet Take 50 mg by mouth daily.   Magnesium 200 MG TABS Take 1 tab by mouth daily with evening meal   naproxen (NAPROSYN) 500 MG tablet Take 1 tablet (500 mg total) by mouth 2 (two) times daily.   ondansetron (ZOFRAN ODT) 4 MG disintegrating tablet Take 1 tablet (4 mg total) by mouth every 8 (eight) hours as needed for nausea or vomiting.   Semaglutide (RYBELSUS) 7 MG TABS Take 1 tablet by mouth daily.   No facility-administered encounter medications on file as of 02/16/2021.    Patient Active Problem List   Diagnosis Date Noted   Colon cancer screening 05/05/2020   Morbid obesity ([place]) 05/05/2020   Type 2 diabetes mellitus without complication, without long-term current use of insulin ([place]) 07/31/2018   Essential hypertension 04/30/2018  Fibroid 11/11/2011    Conditions to be addressed/monitored: Type 2 diabetes mellitus, Essential HTN, Vitamin D deficiency, Bilateral Nephrolithiasis, Aortic Atherosclerosis    RN Care Manager Plan of Care   Problem: No plan of care established for management of chronic disease states (Type 2 diabetes mellitus, Essential  HTN, Vitamin D deficiency, Bilateral Nephrolithiasis, Aortic Atherosclerosis)   Priority: High     Long-Range Goal: Development of plan of care for chronic disease management for Type 2 diabetes mellitus, Essential HTN, Vitamin D deficiency, Bilateral Nephrolithiasis, Aortic Atherosclerosis   Start Date: 02/16/2021  Expected End Date: 02/16/2022  This Visit's Progress: On track  Priority: High  Note:   Current Barriers:  Knowledge Deficits related to plan of care for management of Type 2 diabetes mellitus, Essential HTN, Vitamin D deficiency, Bilateral Nephrolithiasis, Aortic Atherosclerosis  Chronic Disease Management support and education needs related to Type 2 diabetes mellitus, Essential HTN, Vitamin D deficiency, Bilateral Nephrolithiasis, Aortic Atherosclerosis  Financial Constraints  RNCM Clinical Goal(s):  Patient will verbalize basic understanding of  Type 2 diabetes mellitus, Essential HTN, Vitamin D deficiency, Bilateral Nephrolithiasis, Aortic Atherosclerosis  disease process and self health management plan   demonstrate Improved health management independence   continue to work with RN Care Manager to address care management and care coordination needs related to  Type 2 diabetes mellitus, Essential HTN, Vitamin D deficiency, Bilateral Nephrolithiasis, Aortic Atherosclerosis  will demonstrate ongoing self health care management ability    through collaboration with RN Care manager, provider, and care team.   Interventions: 1:1 collaboration with primary care provider regarding development and update of comprehensive plan of care as evidenced by provider attestation and co-signature Inter-disciplinary care team collaboration (see longitudinal plan of care) Evaluation of current treatment plan related to  self management and patient's adherence to plan as established by provider  Hyperlipidemia Interventions: Medication review performed; medication list updated in electronic  medical record.  Provider established cholesterol goals reviewed; Counseled on importance of regular laboratory monitoring as prescribed; Provided HLD educational materials; Reviewed importance of limiting foods high in cholesterol; Reviewed exercise goals and target of 150 minutes per week; Assessed social determinant of health barriers;  Goal on track:  Yes Evaluation of current treatment plan related to  Vitamin D deficiency , self-management and patient's adherence to plan as established by provider. Discussed plans with patient for ongoing care management follow up and provided patient with direct contact information for care management team Provided education to patient re: try to get at least 15 minutes of natural sunlight daily when possible; eat a Vitamin D rich diet ; Reviewed medications with patient and discussed importance of medication adherence, take Vitamin D supplement exactly as prescribed ; Discussed plans with patient for ongoing care management follow up and provided patient with direct contact information for care management team;  Diabetes Interventions: Assessed patient's understanding of A1c goal: <7% Provided education to patient about basic DM disease process; Reviewed medications with patient and discussed importance of medication adherence; Counseled on importance of regular laboratory monitoring as prescribed; Discussed plans with patient for ongoing care management follow up and provided patient with direct contact information for care management team; Provided patient with written educational materials related to hypo and hyperglycemia and importance of correct treatment; Advised patient, providing education and rationale, to check cbg daily before meals and at bedtime and record, calling PCP and or RN CM for findings outside established parameters; Review of patient status, including review of consultants reports, relevant laboratory and  other test results, and  medications completed; Assessed social determinant of health barriers;  Educated on dietary and exercise recommendations Mailed printed educational materials related to Diabetes Management  Lab Results  Component Value Date   HGBA1C 7.7 (H) 02/14/2021   Patient Goals/Self-Care Activities: Patient will self administer medications as prescribed Patient will attend all scheduled provider appointments Patient will call pharmacy for medication refills Patient will attend church or other social activities Patient will continue to perform ADL's independently Patient will continue to perform IADL's independently Patient will call provider office for new concerns or questions  Follow Up Plan:  Telephone follow up appointment with care management team member scheduled for:  04/20/21     Plan:Telephone follow up appointment with care management team member scheduled for:  04/20/21  [name], RN, BSN, CCM Care Management Coordinator [place] [facility]  Direct Phone: [phone]

## 2021-02-28 DIAGNOSIS — E785 Hyperlipidemia, unspecified: Secondary | ICD-10-CM | POA: Diagnosis not present

## 2021-02-28 DIAGNOSIS — E119 Type 2 diabetes mellitus without complications: Secondary | ICD-10-CM | POA: Diagnosis not present

## 2021-02-28 DIAGNOSIS — I10 Essential (primary) hypertension: Secondary | ICD-10-CM | POA: Diagnosis not present

## 2021-03-16 DIAGNOSIS — I10 Essential (primary) hypertension: Secondary | ICD-10-CM | POA: Diagnosis not present

## 2021-03-16 DIAGNOSIS — E1139 Type 2 diabetes mellitus with other diabetic ophthalmic complication: Secondary | ICD-10-CM

## 2021-03-28 ENCOUNTER — Other Ambulatory Visit: Payer: Self-pay | Admitting: Nurse Practitioner

## 2021-03-28 DIAGNOSIS — E119 Type 2 diabetes mellitus without complications: Secondary | ICD-10-CM

## 2021-04-12 ENCOUNTER — Ambulatory Visit: Payer: Medicare Other

## 2021-04-13 ENCOUNTER — Telehealth: Payer: Self-pay

## 2021-04-13 NOTE — Progress Notes (Signed)
Chronic Care Management Pharmacy Assistant   Name: Joanna Sandoval  MRN: [id number] DOB: 30-Oct-1945   Reason for Encounter: Disease State/ Diabetes Adherence Call    Recent office visits:   02/16/2021 [name] RN ( CCM)  02/14/2021 [name] MD (PCP)   02/01/2021 Michelle Nasuti CMA (PCP) Meredith Pel Dose 65+) given   Recent consult visits: None  Hospital visits:   Medication Reconciliation was completed by comparing discharge summary, patients EMR and Pharmacy list, and upon discussion with patient.   Admitted to the hospital on 11-29-2020 due to nausea, vomiting and diarrhea. Discharge date was 11-29-2020. Discharged from [place]?Medications Started at [facility] Discharge:?? Zofran 4 mg every 8 hours as needed   Medication Changes at Hospital Discharge: None   Medications Discontinued at Hospital Discharge: None   Medications that remain the same after Hospital Discharge:??  -All other medications will remain the same.    Medications: Outpatient Encounter Medications as of 04/13/2021  Medication Sig   Accu-Chek Softclix Lancets lancets USE AS DIRECTED   albuterol (PROVENTIL HFA;VENTOLIN HFA) 108 (90 Base) MCG/ACT inhaler Inhale 2 puffs into the lungs every 6 (six) hours as needed.   amoxicillin (AMOXIL) 875 MG tablet Take 1 tablet (875 mg total) by mouth 2 (two) times daily.   atenolol (TENORMIN) 100 MG tablet Take 50 mg by mouth daily.    atorvastatin (LIPITOR) 20 MG tablet Take 1 tablet (20 mg total) by mouth daily.   Blood Glucose Monitoring Suppl (ACCU-CHEK GUIDE) w/Device KIT USE TWICE DAILY AT 10AM AND 5PM   carboxymethylcellulose (REFRESH PLUS) 0.5 % SOLN 1 drop 3 (three) times daily as needed.   D3-50 50000 units capsule    diclofenac sodium (VOLTAREN) 1 % GEL Apply 2 g topically 4 (four) times daily.   glimepiride (AMARYL) 2 MG tablet TAKE 1 TABLET BY MOUTH IN  THE MORNING AND AT BEDTIME   glucose blood (ACCU-CHEK  GUIDE) test strip Use as instructed   [name] KIT Use to test blood sugar twice daily as directed. E11.65   latanoprost (XALATAN) 0.005 % ophthalmic solution 1 drop at bedtime.   losartan (COZAAR) 50 MG tablet Take 50 mg by mouth daily.   Magnesium 200 MG TABS Take 1 tab by mouth daily with evening meal   naproxen (NAPROSYN) 500 MG tablet Take 1 tablet (500 mg total) by mouth 2 (two) times daily.   ondansetron (ZOFRAN ODT) 4 MG disintegrating tablet Take 1 tablet (4 mg total) by mouth every 8 (eight) hours as needed for nausea or vomiting.   RYBELSUS 7 MG TABS TAKE 1 TABLET BY MOUTH  DAILY   No facility-administered encounter medications on file as of 04/13/2021.    Recent Relevant Labs: Lab Results  Component Value Date/Time   HGBA1C 7.7 (H) 02/14/2021 11:17 AM   HGBA1C 7.4 (H) 11/08/2020 09:13 AM   MICROALBUR 30 08/04/2020 09:46 AM   MICROALBUR 30 07/30/2019 10:58 AM    Kidney Function Lab Results  Component Value Date/Time   CREATININE 0.83 02/14/2021 11:17 AM   CREATININE 0.97 11/29/2020 03:33 AM   GFRNONAA >60 11/29/2020 03:33 AM   GFRAA 70 01/29/2020 09:35 AM     Current antihyperglycemic regimen:  Rybelsus $RemoveB'7mg'ekSglSrO$  tablet once per day  Glimepiride 2 mg tablet once per day    Patient verbally confirms she is taking the above medications as directed. Yes  What recent interventions/DTPs have been made to improve glycemic control:  Patient stays away from sweets and carbohydrates.  Have there been any recent hospitalizations or ED visits since last visit with CPP? No  Patient denies hypoglycemic symptoms, including   Patient denies hyperglycemic symptoms, including   How often are you checking your blood sugar? twice daily  What are your blood sugars ranging?  Fasting: 120 Before meals: 110 After meals: 136 Bedtime: 100  On insulin? No   During the week, how often does your blood glucose drop below 70? Never  Are you checking  your feet daily/regularly? Yes  Adherence Review: Is the patient currently on a STATIN medication? Yes Is the patient currently on ACE/ARB medication? Yes Does the patient have >5 day gap between last estimated fill dates? CPP to review  Called patient 04/13/2021 left message Called patient 04/14/2021 left message  Care Gaps: Last eye exam- 10/12/2020  Last Annual Wellness Visit- 08/18/2020 Last Diabetic Foot Exam-08/04/2020    Star Rating Drugs:  Medication:  Last Fill: Day Supply  Atorvastatin 20 mg           08/11, 11/04        90 DS Losartan 50 mg                08/11, 11/04         90 DS Rybelsus $RemoveBef'7mg'DgrvoHmyfQ$                   07/25, 10/18          90 DS Glimepiride 2 mg               08/06, 10/27         90 DS    Cherlyn Labella Clinical Pharmacist Assistant [phone]

## 2021-04-20 ENCOUNTER — Telehealth: Payer: Medicare Other

## 2021-04-20 ENCOUNTER — Ambulatory Visit (INDEPENDENT_AMBULATORY_CARE_PROVIDER_SITE_OTHER): Payer: Medicare Other

## 2021-04-20 DIAGNOSIS — E1139 Type 2 diabetes mellitus with other diabetic ophthalmic complication: Secondary | ICD-10-CM

## 2021-04-20 DIAGNOSIS — N200 Calculus of kidney: Secondary | ICD-10-CM

## 2021-04-20 DIAGNOSIS — I10 Essential (primary) hypertension: Secondary | ICD-10-CM

## 2021-04-20 DIAGNOSIS — I700 Atherosclerosis of aorta: Secondary | ICD-10-CM

## 2021-04-20 DIAGNOSIS — E559 Vitamin D deficiency, unspecified: Secondary | ICD-10-CM

## 2021-04-20 NOTE — Patient Instructions (Signed)
Visit Information  Thank you for taking time to visit with me today. Please don't hesitate to contact me if I can be of assistance to you before our next scheduled telephone appointment.  Following are the goals we discussed today:  (Copy and paste patient goals from clinical care plan here)  Our next appointment is by telephone on 06/02/21 at 12:05 PM  Please call the care guide team at [phone] if you need to cancel or reschedule your appointment.   If you are experiencing a Mental Health or [place] or need someone to talk to, please call [phone]-TALK (toll free, 24 hour hotline)   Patient verbalizes understanding of instructions provided today and agrees to view in [place].   [name], RN, BSN, CCM Care Management Coordinator [place] [facility]  Direct Phone: [phone]

## 2021-04-20 NOTE — Chronic Care Management (AMB) (Signed)
Chronic Care Management   CCM RN Visit Note  04/20/2021 Name: Joanna Sandoval MRN: [id number] DOB: Nov 01, 1945  Subjective: Joanna Sandoval is a 76 y.o. year old female who is a primary care patient of [name], [place]. The care management team was consulted for assistance with disease management and care coordination needs.    Engaged with patient by telephone for follow up visit in response to provider referral for case management and/or care coordination services.   Consent to Services:  The patient was given information about Chronic Care Management services, agreed to services, and gave verbal consent prior to initiation of services.  Please see initial visit note for detailed documentation.   Patient agreed to services and verbal consent obtained.   Assessment: Review of patient past medical history, allergies, medications, health status, including review of consultants reports, laboratory and other test data, was performed as part of comprehensive evaluation and provision of chronic care management services.   SDOH (Social Determinants of Health) assessments and interventions performed:  Yes, no acute challenges   CCM Care Plan  No Known Allergies  Outpatient Encounter Medications as of 04/20/2021  Medication Sig   Accu-Chek Softclix Lancets lancets USE AS DIRECTED   albuterol (PROVENTIL HFA;VENTOLIN HFA) 108 (90 Base) MCG/ACT inhaler Inhale 2 puffs into the lungs every 6 (six) hours as needed.   amoxicillin (AMOXIL) 875 MG tablet Take 1 tablet (875 mg total) by mouth 2 (two) times daily.   atenolol (TENORMIN) 100 MG tablet Take 50 mg by mouth daily.    atorvastatin (LIPITOR) 20 MG tablet Take 1 tablet (20 mg total) by mouth daily.   Blood Glucose Monitoring Suppl (ACCU-CHEK GUIDE) w/Device KIT USE TWICE DAILY AT 10AM AND 5PM   carboxymethylcellulose (REFRESH PLUS) 0.5 % SOLN 1 drop 3 (three) times daily as needed.   D3-50 50000 units capsule    diclofenac sodium (VOLTAREN) 1 %  GEL Apply 2 g topically 4 (four) times daily.   glimepiride (AMARYL) 2 MG tablet TAKE 1 TABLET BY MOUTH IN  THE MORNING AND AT BEDTIME   glucose blood (ACCU-CHEK GUIDE) test strip Use as instructed   [name] KIT Use to test blood sugar twice daily as directed. E11.65   latanoprost (XALATAN) 0.005 % ophthalmic solution 1 drop at bedtime.   losartan (COZAAR) 50 MG tablet Take 50 mg by mouth daily.   Magnesium 200 MG TABS Take 1 tab by mouth daily with evening meal   naproxen (NAPROSYN) 500 MG tablet Take 1 tablet (500 mg total) by mouth 2 (two) times daily.   ondansetron (ZOFRAN ODT) 4 MG disintegrating tablet Take 1 tablet (4 mg total) by mouth every 8 (eight) hours as needed for nausea or vomiting.   RYBELSUS 7 MG TABS TAKE 1 TABLET BY MOUTH  DAILY   No facility-administered encounter medications on file as of 04/20/2021.    Patient Active Problem List   Diagnosis Date Noted   Colon cancer screening 05/05/2020   Morbid obesity ([place]) 05/05/2020   Type 2 diabetes mellitus without complication, without long-term current use of insulin ([place]) 07/31/2018   Essential hypertension 04/30/2018   Fibroid 11/11/2011    Conditions to be addressed/monitored: Type 2 diabetes mellitus, Essential HTN, Vitamin D deficiency, Bilateral Nephrolithiasis, Aortic Atherosclerosis   Care Plan : RN Care Manager Plan of Care  Updates made by [name], RN since 04/20/2021 12:00 AM     Problem: No plan of care established for management of chronic disease  states (Type 2 diabetes mellitus, Essential HTN, Vitamin D deficiency, Bilateral Nephrolithiasis, Aortic Atherosclerosis)   Priority: High     Long-Range Goal: Development of plan of care for chronic disease management for Type 2 diabetes mellitus, Essential HTN, Vitamin D deficiency, Bilateral Nephrolithiasis, Aortic Atherosclerosis   Start Date: 02/16/2021  Expected End Date: 02/16/2022  Recent Progress: On track   Priority: High  Note:   Current Barriers:  Knowledge Deficits related to plan of care for management of Type 2 diabetes mellitus, Essential HTN, Vitamin D deficiency, Bilateral Nephrolithiasis, Aortic Atherosclerosis  Chronic Disease Management support and education needs related to Type 2 diabetes mellitus, Essential HTN, Vitamin D deficiency, Bilateral Nephrolithiasis, Aortic Atherosclerosis  Financial Constraints  RNCM Clinical Goal(s):  Patient will verbalize basic understanding of  Type 2 diabetes mellitus, Essential HTN, Vitamin D deficiency, Bilateral Nephrolithiasis, Aortic Atherosclerosis  disease process and self health management plan   demonstrate Improved health management independence   continue to work with RN Care Manager to address care management and care coordination needs related to  Type 2 diabetes mellitus, Essential HTN, Vitamin D deficiency, Bilateral Nephrolithiasis, Aortic Atherosclerosis  will demonstrate ongoing self health care management ability    through collaboration with RN Care manager, provider, and care team.   Interventions: 1:1 collaboration with primary care provider regarding development and update of comprehensive plan of care as evidenced by provider attestation and co-signature Inter-disciplinary care team collaboration (see longitudinal plan of care) Evaluation of current treatment plan related to  self management and patient's adherence to plan as established by provider  Hyperlipidemia Interventions: (Status: Condition stable. Not addressed with this visit.) Medication review performed; medication list updated in electronic medical record.  Provider established cholesterol goals reviewed; Counseled on importance of regular laboratory monitoring as prescribed; Provided HLD educational materials; Reviewed importance of limiting foods high in cholesterol; Reviewed exercise goals and target of 150 minutes per week; Assessed social determinant of  health barriers;  Goal on track:  Yes Evaluation of current treatment plan related to  Vitamin D deficiency , self-management and patient's adherence to plan as established by provider. Discussed plans with patient for ongoing care management follow up and provided patient with direct contact information for care management team Provided education to patient re: try to get at least 15 minutes of natural sunlight daily when possible; eat a Vitamin D rich diet ; Reviewed medications with patient and discussed importance of medication adherence, take Vitamin D supplement exactly as prescribed ; Discussed plans with patient for ongoing care management follow up and provided patient with direct contact information for care management team;  Diabetes Interventions: (Status: Goal on track. Yes) Assessed patient's understanding of A1c goal: <7% Reviewed medications with patient and discussed importance of medication adherence Counseled on importance of regular laboratory monitoring as prescribed Advised patient, providing education and rationale, to check cbg daily before meals and at bedtime and record, calling PCP and or RN CM for findings outside established parameters Review of patient status, including review of consultants reports, relevant laboratory and other test results, and medications completed Assessed social determinant of health barriers Educated on dietary and exercise recommendations Collaborated with embedded Pharm D [name] concerning patient's Rybelsus dosage, determined patient should take her Rybelsus in the morning with water (no more than 4 oz) on an empty stomach, wait 30 minutes before taking other medications and or eating, patient verbalizes understanding Determine patient does experience intermittent abdominal pain after taking Rybelsus Encouraged patient to notify PCP of  bothersome SE related to Rybelsus during next follow up visit  Reviewed scheduled/upcoming provider  appointments including: PCP follow up with [name] FNP scheduled for 05/23/21 $RemoveBe'@9'zuADIMcVd$ :00 AM Mailed printed educational materials related to the Dangers in Skipping Meals  Lab Results  Component Value Date   HGBA1C 7.7 (H) 02/14/2021  Patient Goals/Self-Care Activities: Patient will self administer medications as prescribed Patient will attend all scheduled provider appointments Patient will call pharmacy for medication refills Patient will attend church or other social activities Patient will continue to perform ADL's independently Patient will continue to perform IADL's independently Patient will call provider office for new concerns or questions  Follow Up Plan:  Telephone follow up appointment with care management team member scheduled for:  06/02/21      Plan:Telephone follow up appointment with care management team member scheduled for:  06/02/21  [name], RN, BSN, CCM Care Management Coordinator [place] [facility]  Direct Phone: [phone]

## 2021-04-20 NOTE — Progress Notes (Signed)
This encounter was created in error - please disregard.

## 2021-04-22 ENCOUNTER — Telehealth: Payer: Self-pay

## 2021-04-22 NOTE — Chronic Care Management (AMB) (Signed)
° ° °  Called Joanna Sandoval, No answer, left message of appointment on 04-26-2021 at 12:00 via telephone visit with [name], Pharm D. Notified to have all medications, supplements, blood pressure and/or blood sugar logs available during appointment and to return call if need to reschedule.   Care Gaps: Shingrix overdue Covid booster overdue AWV 08-31-2021  Star Rating Drug: Glimepiride 2 mg- Last filled 02-10-2021 90 DS [facility] Rybelsus 7 mg- Last filled 03-29-2021 90 DS [facility] Losartan 50 mg- Last filled 02-18-2021 90 DS [facility] Atorvastatin 20 mg- Last filled 02-18-2021 90 DS [facility]  Any gaps in medications fill history? No  [place] Pharmacist Assistant [phone]

## 2021-04-26 ENCOUNTER — Ambulatory Visit: Payer: Medicare Other

## 2021-04-26 DIAGNOSIS — E119 Type 2 diabetes mellitus without complications: Secondary | ICD-10-CM

## 2021-04-26 DIAGNOSIS — I700 Atherosclerosis of aorta: Secondary | ICD-10-CM

## 2021-04-26 NOTE — Progress Notes (Signed)
Chronic Care Management Pharmacy Note  05/04/2021 Name:  Joanna Sandoval MRN:  [id number] DOB:  03-21-1946  Summary: Patient reports that she is happy to be retired.   Recommendations/Changes made from today's visit: Recommend patient get a BP cuff and apply for patient assistance for Rybelsus.   Plan: Patient is going to purchase a BP cuff from [facility] [place]. She is going to complete patient assistance application for Rybelsus 7 mg tablet once per day.    Subjective: [name] is an 76 y.o. year old female who is a primary patient of [name], [place].  The CCM team was consulted for assistance with disease management and care coordination needs.    Engaged with patient by telephone for follow up visit in response to provider referral for pharmacy case management and/or care coordination services. Patient is no longer working anymore her last day was 10/18/2020. She reports that her dad was able to live until 17 but her mother died at 18 years old. She is doing well with her sleeping habits because she is using a cool weighed blanket that reduces stress an anxiety. It is machine washable and dry. Patient reports that her family is very supportive.   Consent to Services:  The patient was given information about Chronic Care Management services, agreed to services, and gave verbal consent prior to initiation of services.  Please see initial visit note for detailed documentation.   Patient Care Team: [name], FNP as PCP - General (General Practice) Rex Kras Claudette Stapler, RN as Case Manager Mayford Knife, [facility] (Pharmacist)  Recent office visits: 02/14/2021 PCP OV  Recent consult visits: 12/30/2020 Specialist Visit  Hospital visits: None in previous 6 months   Objective:  Lab Results  Component Value Date   CREATININE 0.83 02/14/2021   BUN 19 02/14/2021   GFRNONAA >60 11/29/2020   GFRAA 70 01/29/2020   NA 145 (H) 02/14/2021   K 4.3 02/14/2021   CALCIUM 9.6 02/14/2021    CO2 25 02/14/2021   GLUCOSE 114 (H) 02/14/2021    Lab Results  Component Value Date/Time   HGBA1C 7.7 (H) 02/14/2021 11:17 AM   HGBA1C 7.4 (H) 11/08/2020 09:13 AM   MICROALBUR 30 08/04/2020 09:46 AM   MICROALBUR 30 07/30/2019 10:58 AM    Last diabetic Eye exam:  Lab Results  Component Value Date/Time   HMDIABEYEEXA No Retinopathy 10/12/2020 12:00 AM    Last diabetic Foot exam: No results found for: HMDIABFOOTEX   Lab Results  Component Value Date   CHOL 147 02/14/2021   HDL 48 02/14/2021   LDLCALC 72 02/14/2021   TRIG 158 (H) 02/14/2021   CHOLHDL 3.1 02/14/2021    Hepatic Function Latest Ref Rng & Units 02/14/2021 11/29/2020 11/08/2020  Total Protein 6.0 - 8.5 g/dL 7.3 7.1 6.9  Albumin 3.7 - 4.7 g/dL 4.3 3.6 4.3  AST 0 - 40 IU/L $Remov'13 17 13  'FlgFji$ ALT 0 - 32 IU/L $Remov'9 18 10  'eZavZa$ Alk Phosphatase 44 - 121 IU/L 122(H) 106 144(H)  Total Bilirubin 0.0 - 1.2 mg/dL 0.5 1.0 0.7  Bilirubin, Direct - - - -    Lab Results  Component Value Date/Time   TSH 0.721 01/29/2020 09:35 AM   TSH 0.490 07/30/2019 10:59 AM    CBC Latest Ref Rng & Units 12/23/2020 12/01/2020 11/29/2020  WBC 3.4 - 10.8 x10E3/uL 13.3(H) 10.2 11.3(H)  Hemoglobin 11.1 - 15.9 g/dL 12.8 9.7(L) 12.5  Hematocrit 34.0 - 46.6 % 40.2 29.9(L) 39.1  Platelets 150 - 450  x10E3/uL 268 450 249    Lab Results  Component Value Date/Time   VD25OH 41.2 11/08/2020 09:13 AM   VD25OH 27.7 (L) 08/04/2020 10:13 AM    Clinical ASCVD: Yes  The 10-year ASCVD risk score (Arnett DK, et al., 2019) is: 22.7%   Values used to calculate the score:     Age: 31 years     Sex: Female     Is Non-Hispanic African American: Yes     Diabetic: Yes     Tobacco smoker: No     Systolic Blood Pressure: 620 mmHg     Is BP treated: Yes     HDL Cholesterol: 48 mg/dL     Total Cholesterol: 147 mg/dL    Depression screen [facility] 2/9 11/08/2020 08/18/2020 07/07/2020  Decreased Interest 0 0 0  Down, Depressed, Hopeless 0 0 0  PHQ - 2 Score 0 0 0  Altered sleeping  0 - 0  Tired, decreased energy 0 - 0  Change in appetite 0 - 0  Feeling bad or failure about yourself  0 - 0  Trouble concentrating 0 - 0  Moving slowly or fidgety/restless 0 - 0  Suicidal thoughts 0 - 0  PHQ-9 Score 0 - 0  Difficult doing work/chores Not difficult at all - -  Some recent data might be hidden     Social History   Tobacco Use  Smoking Status Former   Types: Cigarettes  Smokeless Tobacco Never   BP Readings from Last 3 Encounters:  02/14/21 124/78  02/01/21 132/80  12/01/20 110/74   Pulse Readings from Last 3 Encounters:  02/14/21 97  02/01/21 91  12/01/20 92   Wt Readings from Last 3 Encounters:  02/14/21 206 lb (93.4 kg)  02/01/21 208 lb (94.3 kg)  12/01/20 210 lb 12.8 oz (95.6 kg)   BMI Readings from Last 3 Encounters:  02/14/21 37.68 kg/m  02/01/21 38.04 kg/m  12/01/20 38.56 kg/m    Assessment/Interventions: Review of patient past medical history, allergies, medications, health status, including review of consultants reports, laboratory and other test data, was performed as part of comprehensive evaluation and provision of chronic care management services.   SDOH:  (Social Determinants of Health) assessments and interventions performed: No  SDOH Screenings   Alcohol Screen: Low Risk    Last Alcohol Screening Score (AUDIT): 0  Depression (PHQ2-9): Low Risk    PHQ-2 Score: 0  Financial Resource Strain: High Risk   Difficulty of Paying Living Expenses: Very hard  Food Insecurity: No Food Insecurity   Worried About Charity fundraiser in the Last Year: Never true   Ran Out of Food in the Last Year: Never true  Housing: Medium Risk   Last Housing Risk Score: 1  Physical Activity: Insufficiently Active   Days of Exercise per Week: 7 days   Minutes of Exercise per Session: 10 min  Social Connections: Moderately Isolated   Frequency of Communication with Friends and Family: More than three times a week   Frequency of Social Gatherings with  Friends and Family: Twice a week   Attends Religious Services: Never   Marine scientist or Organizations: Yes   Attends Archivist Meetings: Never   Marital Status: Divorced  Stress: No Stress Concern Present   Feeling of Stress : Only a little  Tobacco Use: Medium Risk   Smoking Tobacco Use: Former   Smokeless Tobacco Use: Never   Passive Exposure: Not on file  Transportation Needs: No Transportation  Needs   Lack of Transportation (Medical): No   Lack of Transportation (Non-Medical): No    CCM Care Plan  No Known Allergies  Medications Reviewed Today     Reviewed by Mayford Knife, RPH (Pharmacist) on 04/26/21 at 1304  Med List Status: <None>   Medication Order Taking? Sig Documenting Provider Last Dose Status Informant  Accu-Chek Softclix Lancets lancets [id number] Yes USE AS DIRECTED [name], FNP  Active   albuterol (PROVENTIL HFA;VENTOLIN HFA) 108 (90 Base) MCG/ACT inhaler [id number] Yes Inhale 2 puffs into the lungs every 6 (six) hours as needed. [provider]  Active   amoxicillin (AMOXIL) 875 MG tablet [id number]  Take 1 tablet (875 mg total) by mouth 2 (two) times daily. [name], FNP  Active   atenolol (TENORMIN) 100 MG tablet [id number] Yes Take 50 mg by mouth daily.  [provider]  Active   atorvastatin (LIPITOR) 20 MG tablet [id number] Yes Take 1 tablet (20 mg total) by mouth daily. [name], FNP  Active   Blood Glucose Monitoring Suppl (ACCU-CHEK GUIDE) w/Device KIT [id number]  USE TWICE DAILY AT 10AM AND 5PM [name], FNP  Active   carboxymethylcellulose (REFRESH PLUS) 0.5 % SOLN [id number]  1 drop 3 (three) times daily as needed. [provider]  Active Self  D3-50 50000 units capsule [id number]   [provider]  Active   diclofenac sodium (VOLTAREN) 1 % GEL [id number]  Apply 2 g topically 4 (four) times daily. [name], FNP  Active   glimepiride (AMARYL) 2 MG tablet [id number]  TAKE 1 TABLET  BY MOUTH IN  THE MORNING AND AT BEDTIME [name], FNP  Active   glucose blood (ACCU-CHEK GUIDE) test strip [id number]  Use as instructed [name], FNP  Active   [name] KIT [id number]  Use to test blood sugar twice daily as directed. E11.65 [name], FNP  Active   latanoprost (XALATAN) 0.005 % ophthalmic solution [id number]  1 drop at bedtime. [provider]  Active   losartan (COZAAR) 50 MG tablet [id number]  Take 50 mg by mouth daily. [provider]  Active   Magnesium 200 MG TABS [id number]  Take 1 tab by mouth daily with evening meal [name], FNP  Active   naproxen (NAPROSYN) 500 MG tablet [id number]  Take 1 tablet (500 mg total) by mouth 2 (two) times daily. Marney Setting, NP  Active   ondansetron (ZOFRAN ODT) 4 MG disintegrating tablet [id number]  Take 1 tablet (4 mg total) by mouth every 8 (eight) hours as needed for nausea or vomiting. Eustaquio Maize, PA-C  Active   RYBELSUS 7 MG TABS [id number]  TAKE 1 TABLET BY MOUTH  DAILY [name], FNP  Active             Patient Active Problem List   Diagnosis Date Noted   Colon cancer screening 05/05/2020   Morbid obesity ([place]) 05/05/2020   Type 2 diabetes mellitus without complication, without long-term current use of insulin ([place]) 07/31/2018   Essential hypertension 04/30/2018   Fibroid 11/11/2011    Immunization History  Administered Date(s) Administered   Fluad Quad(high Dose 65+) 01/29/2020, 02/01/2021   Influenza, High Dose Seasonal PF 01/16/2018   Influenza,inj,Quad PF,6+ Mos 01/12/2019   PFIZER(Purple Top)SARS-COV-2 Vaccination 05/25/2019, 06/18/2019, 02/14/2020, 09/18/2020   Pneumococcal Polysaccharide-23 08/04/2020   Tdap 10/06/2018   Zoster Recombinat (Shingrix) 02/08/2021    Conditions to be addressed/monitored:  Hypertension and Hyperlipidemia  Care Plan :  CCM Pharmacy Care Plan  Updates made by Mayford Knife, RPH since 05/04/2021 12:00  AM     Problem: DM, HLD   Priority: High     Long-Range Goal: Disease Management   Recent Progress: On track  Priority: High  Note:   Current Barriers:  Unable to independently monitor therapeutic efficacy Unable to achieve control of diabetes   Pharmacist Clinical Goal(s):  Patient will achieve control of cholesterol as evidenced by A1c less than 7 through collaboration with PharmD and provider.   Interventions: 1:1 collaboration with [name], FNP regarding development and update of comprehensive plan of care as evidenced by provider attestation and co-signature Inter-disciplinary care team collaboration (see longitudinal plan of care) Comprehensive medication review performed; medication list updated in electronic medical record  Hypertension (BP goal <130/80) -Controlled -Current treatment: Atenolol 10 mg tablet once per day Appropriate, Effective, Safe, Accessible Losartan 50 mg tablet once per day Appropriate, Effective, Safe, Accessible -Current home readings: she is not currently checking her BP   -Recommended patient receive BP cuff in April from her [place] book -Current dietary habits: she is cooking every 2-3 days, and her grand daughter takes her shopping for groceries once a month.  -Current exercise habits: patient reports that she has been walking a lot  -Denies hypotensive/hypertensive symptoms -Educated on BP goals and benefits of medications for prevention of heart attack, stroke and kidney damage; Importance of home blood pressure monitoring; -Counseled to monitor BP at home at least once per week, document, and provide log at future appointments -Recommended to continue current medication  Hyperlipidemia: (LDL goal < 70) -Not ideally controlled  -Current treatment: Atorvastatin 20 mg tablet once per day Appropriate, Effective, Safe, Accessible -Current dietary patterns: patient reports that she is eating plenty of grilled foods -Current exercise  habits: patient reports that she is walking at least 20 -30 minutes per day.  -Educated on Cholesterol goals;  Benefits of statin for ASCVD risk reduction; Importance of limiting foods high in cholesterol; -Recommended to continue current medication -We discussed the importance of her taking her medication everyday to achieve optimal results -Discussed with patient the possibility of increasing her medication, at this time she is not comfortable with this option and would like to wait until she has labs completed again to determine this possibility.   Patient Goals/Self-Care Activities Patient will:  - take medications as prescribed as evidenced by patient report and record review  Follow Up Plan: The patient has been provided with contact information for the care management team and has been advised to call with any health related questions or concerns.       Medication Assistance: Application for Rybelsus  medication assistance program. in process.  Anticipated assistance start date 05/18/2021.  See plan of care for additional detail.  Compliance/Adherence/Medication fill history: Care Gaps: COVID-19 Vaccine Booster - 03/04/2021 - will let PCP know that  Shingrix Vaccine  Star-Rating Drugs: Atorvastatin 20 mg tablet  Glimepiride 2 mg tablet Losartan 50 mg tablet Rybelsus 7 mg tablet   Patient's preferred pharmacy is:  [facility] [place], [address] [place] [address] Phone: [phone] Fax: [phone]  [facility] ([place], [place] [facility] [address] [place] [address] Phone: [phone] Fax: [phone]  [facility] ([facility] ) - [place], [place] - [phone]  [address] [place] [address] Phone: [phone] Fax: [phone]  Uses pill box? Yes Pt  endorses 90% compliance  We discussed: Benefits of medication synchronization, packaging and delivery as well as enhanced pharmacist oversight with [employer]. Patient decided to: Continue current medication management strategy  Care Plan and Follow Up Patient Decision:  Patient agrees to Care Plan and Follow-up.  Plan: The patient has been provided with contact information for the care management team and has been advised to call with any health related questions or concerns.   [name], CPP, PharmD Clinical Pharmacist Practitioner [facility] [phone]

## 2021-05-04 NOTE — Patient Instructions (Addendum)
Visit Information It was great speaking with you today!  Please let me know if you have any questions about our visit.   Goals Addressed             This Visit's Progress    Manage My Medicine       Timeframe:  Long-Range Goal Priority:  Medium Start Date:                             Expected End Date:                       Follow Up Date: 10/26/2021   - call for medicine refill 2 or 3 days before it runs out - call if I am sick and can't take my medicine - keep a list of all the medicines I take; vitamins and herbals too - use a pillbox to sort medicine    Why is this important?   These steps will help you keep on track with your medicines.   Notes:  Please call if you have any questions.         Patient Care Plan: CCM Pharmacy Care Plan     Problem Identified: DM, HLD   Priority: High     Long-Range Goal: Disease Management   Recent Progress: On track  Priority: High  Note:   Current Barriers:  Unable to independently monitor therapeutic efficacy Unable to achieve control of diabetes   Pharmacist Clinical Goal(s):  Patient will achieve control of cholesterol as evidenced by A1c less than 7 through collaboration with PharmD and provider.   Interventions: 1:1 collaboration with [name], FNP regarding development and update of comprehensive plan of care as evidenced by provider attestation and co-signature Inter-disciplinary care team collaboration (see longitudinal plan of care) Comprehensive medication review performed; medication list updated in electronic medical record  Hypertension (BP goal <130/80) -Controlled -Current treatment: Atenolol 10 mg tablet once per day Appropriate, Effective, Safe, Accessible Losartan 50 mg tablet once per day Appropriate, Effective, Safe, Accessible -Current home readings: she is not currently checking her BP   -Recommended patient receive BP cuff in April from her [place] book -Current dietary habits: she is  cooking every 2-3 days, and her grand daughter takes her shopping for groceries once a month.  -Current exercise habits: patient reports that she has been walking a lot  -Denies hypotensive/hypertensive symptoms -Educated on BP goals and benefits of medications for prevention of heart attack, stroke and kidney damage; Importance of home blood pressure monitoring; -Counseled to monitor BP at home at least once per week, document, and provide log at future appointments -Recommended to continue current medication  Hyperlipidemia: (LDL goal < 70) -Not ideally controlled  -Current treatment: Atorvastatin 20 mg tablet once per day Appropriate, Effective, Safe, Accessible -Current dietary patterns: patient reports that she is eating plenty of grilled foods -Current exercise habits: patient reports that she is walking at least 20 -30 minutes per day.  -Educated on Cholesterol goals;  Benefits of statin for ASCVD risk reduction; Importance of limiting foods high in cholesterol; -Recommended to continue current medication -Discussed with patient the possibility of increasing her medication, at this time she is not comfortable with this option and would like to wait until she has labs completed again to determine this possibility.   Patient Goals/Self-Care Activities Patient will:  - take medications as prescribed as evidenced by patient report and record  review  Follow Up Plan: The patient has been provided with contact information for the care management team and has been advised to call with any health related questions or concerns.        Patient agreed to services and verbal consent obtained.   The patient verbalized understanding of instructions, educational materials, and care plan provided today and agreed to receive a mailed copy of patient instructions, educational materials, and care plan.   [name], PharmD Clinical Pharmacist [facility] [phone]

## 2021-05-06 ENCOUNTER — Other Ambulatory Visit: Payer: Self-pay | Admitting: Nurse Practitioner

## 2021-05-17 ENCOUNTER — Other Ambulatory Visit: Payer: Self-pay

## 2021-05-17 DIAGNOSIS — I10 Essential (primary) hypertension: Secondary | ICD-10-CM

## 2021-05-17 DIAGNOSIS — E1139 Type 2 diabetes mellitus with other diabetic ophthalmic complication: Secondary | ICD-10-CM

## 2021-05-17 MED ORDER — ACCU-CHEK GUIDE VI STRP: ORAL_STRIP | 12 refills | Status: DC

## 2021-05-23 ENCOUNTER — Encounter: Payer: Self-pay | Admitting: Nurse Practitioner

## 2021-05-23 ENCOUNTER — Ambulatory Visit (INDEPENDENT_AMBULATORY_CARE_PROVIDER_SITE_OTHER): Payer: Medicare Other | Admitting: Nurse Practitioner

## 2021-05-23 ENCOUNTER — Other Ambulatory Visit: Payer: Self-pay

## 2021-05-23 VITALS — BP 122/60 | HR 63 | Temp 98.2°F | Ht 62.0 in | Wt 204.4 lb

## 2021-05-23 DIAGNOSIS — E1169 Type 2 diabetes mellitus with other specified complication: Secondary | ICD-10-CM

## 2021-05-23 DIAGNOSIS — I700 Atherosclerosis of aorta: Secondary | ICD-10-CM

## 2021-05-23 DIAGNOSIS — Z6837 Body mass index (BMI) 37.0-37.9, adult: Secondary | ICD-10-CM

## 2021-05-23 DIAGNOSIS — I10 Essential (primary) hypertension: Secondary | ICD-10-CM | POA: Diagnosis not present

## 2021-05-23 DIAGNOSIS — R82998 Other abnormal findings in urine: Secondary | ICD-10-CM | POA: Diagnosis not present

## 2021-05-23 DIAGNOSIS — E6609 Other obesity due to excess calories: Secondary | ICD-10-CM

## 2021-05-23 DIAGNOSIS — E119 Type 2 diabetes mellitus without complications: Secondary | ICD-10-CM | POA: Diagnosis not present

## 2021-05-23 MED ORDER — RYBELSUS 14 MG PO TABS: 1.0000 | ORAL_TABLET | Freq: Every day | ORAL | 1 refills | Status: DC

## 2021-05-23 NOTE — Progress Notes (Signed)
I,Tianna Badgett,acting as a Education administrator for [employer], FNP.,have documented all relevant documentation on the behalf of [name], FNP,as directed by  [name], FNP while in the presence of [name], [place].  This visit occurred during the SARS-CoV-2 public health emergency.  Safety protocols were in place, including screening questions prior to the visit, additional usage of staff PPE, and extensive cleaning of exam room while observing appropriate contact time as indicated for disinfecting solutions.  Subjective:     Patient ID: Joanna Sandoval , female    DOB: Mar 17, 1946 , 76 y.o.   MRN: [id number]   Chief Complaint  Patient presents with   Diabetes    HPI  Patient is here today for diabetes and hypertension check. 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 She feels it is time to be her and work on her health. She reports she gets out with her neighbors, states " I love walking even if I have to bundle up."  She is to see Dr. Terrence Dupont next Monday.  Wt Readings from Last 3 Encounters: 05/23/21 : 204 lb 6.4 oz (92.7 kg) 02/14/21 : 206 lb (93.4 kg) 02/01/21 : 208 lb (94.3 kg)        Diabetes She presents for her follow-up diabetic visit. She has type 2 diabetes mellitus. Her disease course has been worsening. Pertinent negatives for hypoglycemia include no dizziness or headaches. Pertinent negatives for diabetes include no chest pain, no fatigue, no polydipsia, no polyphagia and no polyuria. Symptoms are resolved. There are no diabetic complications. Risk factors for coronary artery disease include obesity, hypertension, diabetes mellitus, dyslipidemia, sedentary lifestyle and post-menopausal. Current diabetic treatment includes oral agent (dual therapy). She is compliant with treatment most of the time. Her weight is stable. She is following a generally unhealthy diet. When asked about meal planning, she reported none. She has not had a previous  visit with a dietitian. She participates in exercise every other day. Her home blood glucose trend is decreasing steadily. (Reports her blood sugar this morning was 84. She does admit to eating an increased amount of fruit. ) She does not see a podiatrist.Eye exam is current (done 09/22/2019 neg retinopathy).  Hypertension This is a chronic problem. The current episode started more than 1 year ago. The problem has been gradually improving since onset. The problem is controlled. Pertinent negatives include no chest pain, headaches or palpitations. There are no associated agents to hypertension. Risk factors for coronary artery disease include obesity, post-menopausal state, sedentary lifestyle and diabetes mellitus. Compliance problems include exercise.  There is no history of angina or kidney disease. There is no history of chronic renal disease or sleep apnea.    Past Medical History:  Diagnosis Date   Arthritis    Diabetes mellitus    Hypercholesteremia    Hypertension    Post-menopausal bleeding    Postmenopausal bleeding 08/30/2011   Normal EBX 09/2011.     Family History  Problem Relation Age of Onset   Hypertension Mother    Hypertension Father    Cancer Father        ?lung   Diabetes Sister      Current Outpatient Medications:    Semaglutide (RYBELSUS) 14 MG TABS, Take 1 tablet by mouth daily. Take 30 minutes before breakfast, Disp: 90 tablet, Rfl: 1   Accu-Chek Softclix Lancets lancets, USE AS DIRECTED, Disp: 200 each, Rfl: 3   albuterol (PROVENTIL HFA;VENTOLIN HFA) 108 (90 Base) MCG/ACT  inhaler, Inhale 2 puffs into the lungs every 6 (six) hours as needed., Disp: , Rfl:    atenolol (TENORMIN) 100 MG tablet, Take 50 mg by mouth daily. , Disp: , Rfl:    atorvastatin (LIPITOR) 20 MG tablet, Take 1 tablet (20 mg total) by mouth daily., Disp: 90 tablet, Rfl: 1   Blood Glucose Monitoring Suppl (ACCU-CHEK GUIDE) w/Device KIT, USE TO CHECK BLOOD SUGAR  TWICE DAILY AT 10AM AND 5PM, Disp:  1 kit, Rfl: 1   carboxymethylcellulose (REFRESH PLUS) 0.5 % SOLN, 1 drop 3 (three) times daily as needed., Disp: , Rfl:    D3-50 50000 units capsule, , Disp: , Rfl: 0   diclofenac sodium (VOLTAREN) 1 % GEL, Apply 2 g topically 4 (four) times daily., Disp: 100 g, Rfl: 1   glimepiride (AMARYL) 2 MG tablet, TAKE 1 TABLET BY MOUTH IN  THE MORNING AND AT BEDTIME, Disp: 180 tablet, Rfl: 3   glucose blood (ACCU-CHEK GUIDE) test strip, Use as instructed, Disp: 100 each, Rfl: 12   [name] KIT, Use to test blood sugar twice daily as directed. E11.65, Disp: 1 kit, Rfl: 12   latanoprost (XALATAN) 0.005 % ophthalmic solution, 1 drop at bedtime., Disp: , Rfl:    losartan (COZAAR) 50 MG tablet, Take 50 mg by mouth daily., Disp: , Rfl:    Magnesium 200 MG TABS, Take 1 tab by mouth daily with evening meal, Disp: 30 tablet, Rfl: 2   naproxen (NAPROSYN) 500 MG tablet, Take 1 tablet (500 mg total) by mouth 2 (two) times daily., Disp: 30 tablet, Rfl: 0   ondansetron (ZOFRAN ODT) 4 MG disintegrating tablet, Take 1 tablet (4 mg total) by mouth every 8 (eight) hours as needed for nausea or vomiting., Disp: 20 tablet, Rfl: 0   No Known Allergies   Review of Systems  Constitutional: Negative.  Negative for fatigue.  Respiratory: Negative.    Cardiovascular:  Negative for chest pain, palpitations and leg swelling.  Endocrine: Negative for polydipsia, polyphagia and polyuria.  Neurological:  Negative for dizziness and headaches.  Psychiatric/Behavioral: Negative.      Today's Vitals   05/23/21 0853  BP: 122/60  Pulse: 63  Temp: 98.2 F (36.8 C)  TempSrc: Oral  Weight: 204 lb 6.4 oz (92.7 kg)  Height: $Remove'5\' 2"'RqSjxHr$  (1.575 m)   Body mass index is 37.39 kg/m.  Wt Readings from Last 3 Encounters:  05/23/21 204 lb 6.4 oz (92.7 kg)  02/14/21 206 lb (93.4 kg)  02/01/21 208 lb (94.3 kg)    Objective:  Physical Exam Vitals reviewed.  Constitutional:      General: She is not in acute  distress.    Appearance: Normal appearance. She is obese.  HENT:     Head: Normocephalic.     Nose:     Comments: Deferred - masked    Mouth/Throat:     Comments: Deferred - masked Eyes:     Extraocular Movements: Extraocular movements intact.     Conjunctiva/sclera: Conjunctivae normal.     Pupils: Pupils are equal, round, and reactive to light.  Cardiovascular:     Rate and Rhythm: Normal rate and regular rhythm.     Pulses: Normal pulses.     Heart sounds: Normal heart sounds. No murmur heard. Pulmonary:     Effort: Pulmonary effort is normal. No respiratory distress.     Breath sounds: Normal breath sounds. No wheezing.  Chest:     Chest wall: No mass.  Breasts:  Tanner Score is 5.     Right: Normal. No tenderness.     Left: Tenderness present.  Abdominal:     General: Abdomen is flat. Bowel sounds are normal. There is no distension.     Palpations: Abdomen is soft. There is no mass.     Tenderness: There is no abdominal tenderness.  Musculoskeletal:     Comments: Using a cane for support, sometimes will use a rollator walker  Skin:    General: Skin is warm and dry.     Capillary Refill: Capillary refill takes less than 2 seconds.  Neurological:     General: No focal deficit present.     Mental Status: She is alert and oriented to person, place, and time.     Cranial Nerves: No cranial nerve deficit.     Motor: No weakness.  Psychiatric:        Mood and Affect: Mood normal.        Behavior: Behavior normal.        Thought Content: Thought content normal.        Judgment: Judgment normal.        Assessment And Plan:     1. Type 2 diabetes mellitus without complication, without long-term current use of insulin (HCC) Comments: Will send increased dose of Rybelsus to 14 mg when she gets down to one month.  - Semaglutide (RYBELSUS) 14 MG TABS; Take 1 tablet by mouth daily. Take 30 minutes before breakfast  Dispense: 90 tablet; Refill: 1 - CMP14+EGFR - Hemoglobin  A1c  2. Essential hypertension B/P is controlled.  CMP ordered to check renal function.  The importance of regular exercise and dietary modification was stressed to the patient.  - CMP14+EGFR  3. Aortic atherosclerosis (HCC) Tolerating statin well.  - Lipid panel  4. Class 2 obesity due to excess calories with body mass index (BMI) of 37.0 to 37.9 in adult, unspecified whether serious comorbidity present She is encouraged to strive for BMI less than 30 to decrease cardiac risk. Advised to aim for at least 150 minutes of exercise per week. Encouraged to make sure she is being active since she is now retired.     Patient was given opportunity to ask questions. Patient verbalized understanding of the plan and was able to repeat key elements of the plan. All questions were answered to their satisfaction.  [name], FNP   I, [name], FNP, have reviewed all documentation for this visit. The documentation on 05/23/21 for the exam, diagnosis, procedures, and orders are all accurate and complete.   IF YOU HAVE BEEN REFERRED TO A SPECIALIST, IT MAY TAKE 1-2 WEEKS TO SCHEDULE/PROCESS THE REFERRAL. IF YOU HAVE NOT HEARD FROM US/SPECIALIST IN TWO WEEKS, PLEASE GIVE [place] A CALL AT [phone].   THE PATIENT IS ENCOURAGED TO PRACTICE SOCIAL DISTANCING DUE TO THE COVID-19 PANDEMIC.

## 2021-05-23 NOTE — Patient Instructions (Addendum)

## 2021-05-23 NOTE — Addendum Note (Signed)
Addended by: Roxine Caddy on: 05/23/2021 10:39 AM   Modules accepted: Orders

## 2021-05-23 NOTE — Addendum Note (Signed)
Addended by: [name] F on: 05/23/2021 05:10 PM   Modules accepted: Orders

## 2021-05-24 LAB — CMP14+EGFR
ALT: 11 IU/L (ref 0–32)
AST: 13 IU/L (ref 0–40)
Albumin/Globulin Ratio: 1.5 (ref 1.2–2.2)
Albumin: 4.2 g/dL (ref 3.7–4.7)
Alkaline Phosphatase: 138 IU/L — ABNORMAL HIGH (ref 44–121)
BUN/Creatinine Ratio: 15 (ref 12–28)
BUN: 16 mg/dL (ref 8–27)
Bilirubin Total: 0.6 mg/dL (ref 0.0–1.2)
CO2: 26 mmol/L (ref 20–29)
Calcium: 9.4 mg/dL (ref 8.7–10.3)
Chloride: 104 mmol/L (ref 96–106)
Creatinine, Ser: 1.06 mg/dL — ABNORMAL HIGH (ref 0.57–1.00)
Globulin, Total: 2.8 g/dL (ref 1.5–4.5)
Glucose: 103 mg/dL — ABNORMAL HIGH (ref 70–99)
Potassium: 4.4 mmol/L (ref 3.5–5.2)
Sodium: 145 mmol/L — ABNORMAL HIGH (ref 134–144)
Total Protein: 7 g/dL (ref 6.0–8.5)
eGFR: 55 mL/min/{1.73_m2} — ABNORMAL LOW (ref >59)

## 2021-05-24 LAB — MICROALBUMIN / CREATININE URINE RATIO
Creatinine, Urine: 198.4 mg/dL
Microalb/Creat Ratio: 12 mg/g creat (ref 0–29)
Microalbumin, Urine: 23.8 ug/mL

## 2021-05-24 LAB — HEMOGLOBIN A1C
Est. average glucose Bld gHb Est-mCnc: 171 mg/dL
Hgb A1c MFr Bld: 7.6 % — ABNORMAL HIGH (ref 4.8–5.6)

## 2021-05-24 LAB — LIPID PANEL
Chol/HDL Ratio: 3.1 ratio (ref 0.0–4.4)
Cholesterol, Total: 138 mg/dL (ref 100–199)
HDL: 45 mg/dL (ref >39)
LDL Chol Calc (NIH): 69 mg/dL (ref 0–99)
Triglycerides: 139 mg/dL (ref 0–149)
VLDL Cholesterol Cal: 24 mg/dL (ref 5–40)

## 2021-05-25 LAB — URINE CULTURE

## 2021-05-27 ENCOUNTER — Telehealth: Payer: Self-pay

## 2021-05-27 NOTE — Chronic Care Management (AMB) (Addendum)
Chronic Care Management Pharmacy Assistant   Name: Joanna Sandoval  MRN: [id number] DOB: June 27, 1945   Reason for Encounter: Disease State/ Diabetes  Recent office visits:  05-23-2021 Joanna Sandoval, [place]. Glucose= 103 , Creatinine= 1.06, eGFR= 55, Sodium= 145. A1C= 7.6. Abnormal urine culture. FINISHED amoxicillin.  Recent consult visits:  None  Hospital visits:  Medication Reconciliation was completed by comparing discharge summary, patients EMR and Pharmacy list, and upon discussion with patient.  Admitted to the hospital on 11-29-2020 due to Nausea vomiting and diarrhea Discharge date was 11-29-2020 Discharged from [place]?Medications Started at [facility] Discharge:?? Zofran 4 mg every 8 hours as needed  Medication Changes at Hospital Discharge: None  Medications Discontinued at Hospital Discharge: None  Medications that remain the same after Hospital Discharge:??  -All other medications will remain the same.    Medications: Outpatient Encounter Medications as of 05/27/2021  Medication Sig   Accu-Chek Softclix Lancets lancets USE AS DIRECTED   albuterol (PROVENTIL HFA;VENTOLIN HFA) 108 (90 Base) MCG/ACT inhaler Inhale 2 puffs into the lungs every 6 (six) hours as needed.   atenolol (TENORMIN) 100 MG tablet Take 50 mg by mouth daily.    atorvastatin (LIPITOR) 20 MG tablet Take 1 tablet (20 mg total) by mouth daily.   Blood Glucose Monitoring Suppl (ACCU-CHEK GUIDE) w/Device KIT USE TO CHECK BLOOD SUGAR  TWICE DAILY AT 10AM AND 5PM   carboxymethylcellulose (REFRESH PLUS) 0.5 % SOLN 1 drop 3 (three) times daily as needed.   D3-50 50000 units capsule    diclofenac sodium (VOLTAREN) 1 % GEL Apply 2 g topically 4 (four) times daily.   glimepiride (AMARYL) 2 MG tablet TAKE 1 TABLET BY MOUTH IN  THE MORNING AND AT BEDTIME   glucose blood (ACCU-CHEK GUIDE) test strip Use as instructed   [name] KIT Use to test blood  sugar twice daily as directed. E11.65   latanoprost (XALATAN) 0.005 % ophthalmic solution 1 drop at bedtime.   losartan (COZAAR) 50 MG tablet Take 50 mg by mouth daily.   Magnesium 200 MG TABS Take 1 tab by mouth daily with evening meal   naproxen (NAPROSYN) 500 MG tablet Take 1 tablet (500 mg total) by mouth 2 (two) times daily.   ondansetron (ZOFRAN ODT) 4 MG disintegrating tablet Take 1 tablet (4 mg total) by mouth every 8 (eight) hours as needed for nausea or vomiting.   Semaglutide (RYBELSUS) 14 MG TABS Take 1 tablet by mouth daily. Take 30 minutes before breakfast   No facility-administered encounter medications on file as of 05/27/2021.  Recent Relevant Labs: Lab Results  Component Value Date/Time   HGBA1C 7.6 (H) 05/23/2021 09:49 AM   HGBA1C 7.7 (H) 02/14/2021 11:17 AM   MICROALBUR 30 08/04/2020 09:46 AM   MICROALBUR 30 07/30/2019 10:58 AM    Kidney Function Lab Results  Component Value Date/Time   CREATININE 1.06 (H) 05/23/2021 09:49 AM   CREATININE 0.83 02/14/2021 11:17 AM   GFRNONAA >60 11/29/2020 03:33 AM   GFRAA 70 01/29/2020 09:35 AM    Current antihyperglycemic regimen:  Rybelsus 7 mg daily Glimepiride 2 mg daily  What recent interventions/DTPs have been made to improve glycemic control:  Educated on A1c and blood sugar goals; Exercise goal of 150 minutes per week; Benefits of routine self-monitoring of blood sugar; -Counseled to check feet daily and get yearly eye exams -Counseled on diet and exercise extensively Recommended to continue current medication  Have there  been any recent hospitalizations or ED visits since last visit with CPP? No  Patient denies hypoglycemic symptoms  Patient denies hyperglycemic symptoms  How often are you checking your blood sugar? once daily  What are your blood sugars ranging?  Fasting: 02-08 130, 02-09 120, 02-10 67 Before meals: None After meals: 02-10 122 Bedtime: None  During the week, how often does your blood  glucose drop below 70? Patient stated sugar was 67 fasting this morning. After drinking orange juice and eating it went up to 122.  Are you checking your feet daily/regularly? Patient stated daily  Adherence Review: Is the patient currently on a STATIN medication? Yes Is the patient currently on ACE/ARB medication? Yes Does the patient have >5 day gap between last estimated fill dates? No  NOTES: Patient stated she is to finish the 2.5 bottles of Rybelsus 7 mg before starting 14 mg. Patient stated she isn't sure if medication is causing her bad cramps. Patient was given lab results from 05-23-2021 office visit. Informed Joanna Sandoval CPP and Joanna Brine FNP about concerns and what antibiotic was sent to pharmacy for her UTI. I was unable to find antibiotic that was sent in.  Care Gaps: Covid booster overdue Shingrix overdue AWV 08-31-2021  Star Rating Drugs: Atorvastatin 40 mg- Last filled 05-14-2021 90 DS [facility] Rybelsus 7 mg- Last filled 04-13-2021 90 DS [facility] Losartan 50 mg- Last filled 05-14-2021 90 DS  [place] Clinical Pharmacist Assistant [phone]

## 2021-05-30 DIAGNOSIS — E1169 Type 2 diabetes mellitus with other specified complication: Secondary | ICD-10-CM | POA: Diagnosis not present

## 2021-05-30 DIAGNOSIS — I10 Essential (primary) hypertension: Secondary | ICD-10-CM | POA: Diagnosis not present

## 2021-06-02 ENCOUNTER — Telehealth: Payer: Medicare Other

## 2021-06-02 ENCOUNTER — Ambulatory Visit (INDEPENDENT_AMBULATORY_CARE_PROVIDER_SITE_OTHER): Payer: Medicare Other

## 2021-06-02 ENCOUNTER — Other Ambulatory Visit: Payer: Self-pay | Admitting: Nurse Practitioner

## 2021-06-02 DIAGNOSIS — I700 Atherosclerosis of aorta: Secondary | ICD-10-CM

## 2021-06-02 DIAGNOSIS — N200 Calculus of kidney: Secondary | ICD-10-CM

## 2021-06-02 DIAGNOSIS — E559 Vitamin D deficiency, unspecified: Secondary | ICD-10-CM

## 2021-06-02 DIAGNOSIS — I10 Essential (primary) hypertension: Secondary | ICD-10-CM

## 2021-06-02 DIAGNOSIS — E119 Type 2 diabetes mellitus without complications: Secondary | ICD-10-CM

## 2021-06-02 DIAGNOSIS — R269 Unspecified abnormalities of gait and mobility: Secondary | ICD-10-CM | POA: Diagnosis not present

## 2021-06-02 MED ORDER — NITROFURANTOIN MONOHYD MACRO 100 MG PO CAPS: 100.0000 mg | ORAL_CAPSULE | Freq: Two times a day (BID) | ORAL | 0 refills | Status: AC

## 2021-06-02 NOTE — Chronic Care Management (AMB) (Signed)
Chronic Care Management   CCM RN Visit Note  06/02/2021 Name: Joanna Sandoval MRN: [id number] DOB: [birth year]  Subjective: Joanna Sandoval is a 76 y.o. year old female who is a primary care patient of [name], [place]. The care management team was consulted for assistance with disease management and care coordination needs.    Engaged with patient by telephone for follow up visit in response to provider referral for case management and/or care coordination services.   Consent to Services:  The patient was given information about Chronic Care Management services, agreed to services, and gave verbal consent prior to initiation of services.  Please see initial visit note for detailed documentation.   Patient agreed to services and verbal consent obtained.   Assessment: Review of patient past medical history, allergies, medications, health status, including review of consultants reports, laboratory and other test data, was performed as part of comprehensive evaluation and provision of chronic care management services.   SDOH (Social Determinants of Health) assessments and interventions performed:    CCM Care Plan  No Known Allergies  Outpatient Encounter Medications as of 06/02/2021  Medication Sig   Accu-Chek Softclix Lancets lancets USE AS DIRECTED   albuterol (PROVENTIL HFA;VENTOLIN HFA) 108 (90 Base) MCG/ACT inhaler Inhale 2 puffs into the lungs every 6 (six) hours as needed.   atenolol (TENORMIN) 100 MG tablet Take 50 mg by mouth daily.    atorvastatin (LIPITOR) 20 MG tablet Take 1 tablet (20 mg total) by mouth daily.   Blood Glucose Monitoring Suppl (ACCU-CHEK GUIDE) w/Device KIT USE TO CHECK BLOOD SUGAR  TWICE DAILY AT 10AM AND 5PM   carboxymethylcellulose (REFRESH PLUS) 0.5 % SOLN 1 drop 3 (three) times daily as needed.   D3-50 50000 units capsule    diclofenac sodium (VOLTAREN) 1 % GEL Apply 2 g topically 4 (four) times daily.   glimepiride (AMARYL) 2 MG tablet TAKE 1 TABLET  BY MOUTH IN  THE MORNING AND AT BEDTIME   glucose blood (ACCU-CHEK GUIDE) test strip Use as instructed   [name] KIT Use to test blood sugar twice daily as directed. E11.65   latanoprost (XALATAN) 0.005 % ophthalmic solution 1 drop at bedtime.   losartan (COZAAR) 50 MG tablet Take 50 mg by mouth daily.   Magnesium 200 MG TABS Take 1 tab by mouth daily with evening meal   naproxen (NAPROSYN) 500 MG tablet Take 1 tablet (500 mg total) by mouth 2 (two) times daily.   ondansetron (ZOFRAN ODT) 4 MG disintegrating tablet Take 1 tablet (4 mg total) by mouth every 8 (eight) hours as needed for nausea or vomiting.   Semaglutide (RYBELSUS) 14 MG TABS Take 1 tablet by mouth daily. Take 30 minutes before breakfast   No facility-administered encounter medications on file as of 06/02/2021.    Patient Active Problem List   Diagnosis Date Noted   Colon cancer screening 05/05/2020   Morbid obesity ([place]) 05/05/2020   Type 2 diabetes mellitus without complication, without long-term current use of insulin ([place]) 07/31/2018   Essential hypertension 04/30/2018   Fibroid 11/11/2011    Conditions to be addressed/monitored: Type 2 diabetes mellitus, Essential HTN, Vitamin D deficiency, Bilateral Nephrolithiasis, Aortic Atherosclerosis   Care Plan : RN Care Manager Plan of Care  Updates made by [name], RN since 06/02/2021 12:00 AM     Problem: No plan of care established for management of chronic disease states (Type 2 diabetes mellitus, Essential HTN, Vitamin D deficiency, Bilateral Nephrolithiasis, Aortic  Atherosclerosis)   Priority: High     Long-Range Goal: Development of plan of care for chronic disease management for Type 2 diabetes mellitus, Essential HTN, Vitamin D deficiency, Bilateral Nephrolithiasis, Aortic Atherosclerosis   Start Date: 02/16/2021  Expected End Date: 02/16/2022  Recent Progress: On track  Priority: High  Note:   Current Barriers:   Knowledge Deficits related to plan of care for management of Type 2 diabetes mellitus, Essential HTN, Vitamin D deficiency, Bilateral Nephrolithiasis, Aortic Atherosclerosis  Chronic Disease Management support and education needs related to Type 2 diabetes mellitus, Essential HTN, Vitamin D deficiency, Bilateral Nephrolithiasis, Aortic Atherosclerosis  Financial Constraints  RNCM Clinical Goal(s):  Patient will verbalize basic understanding of  Type 2 diabetes mellitus, Essential HTN, Vitamin D deficiency, Bilateral Nephrolithiasis, Aortic Atherosclerosis  disease process and self health management plan   demonstrate Improved health management independence   continue to work with RN Care Manager to address care management and care coordination needs related to  Type 2 diabetes mellitus, Essential HTN, Vitamin D deficiency, Bilateral Nephrolithiasis, Aortic Atherosclerosis  will demonstrate ongoing self health care management ability    through collaboration with RN Care manager, provider, and care team.   Interventions: 1:1 collaboration with primary care provider regarding development and update of comprehensive plan of care as evidenced by provider attestation and co-signature Inter-disciplinary care team collaboration (see longitudinal plan of care) Evaluation of current treatment plan related to  self management and patient's adherence to plan as established by provider  Hyperlipidemia Interventions: (Status: Condition stable. Not addressed with this visit.) Medication review performed; medication list updated in electronic medical record.  Provider established cholesterol goals reviewed; Counseled on importance of regular laboratory monitoring as prescribed; Provided HLD educational materials; Reviewed importance of limiting foods high in cholesterol; Reviewed exercise goals and target of 150 minutes per week; Assessed social determinant of health barriers;  Lipid Panel     Component  Value Date/Time   CHOL 138 05/23/2021 0949   TRIG 139 05/23/2021 0949   HDL 45 05/23/2021 0949   CHOLHDL 3.1 05/23/2021 0949   CHOLHDL 4.6 10/12/2006 0840   VLDL 16 10/12/2006 0840   LDLCALC 69 05/23/2021 0949   LABVLDL 24 05/23/2021 0949    Vitamin D deficiency Interventions: Status: (Condition stable.  Not addressed this visit.) Long Term Goal Evaluation of current treatment plan related to  Vitamin D deficiency , self-management and patient's adherence to plan as established by provider. Discussed plans with patient for ongoing care management follow up and provided patient with direct contact information for care management team Provided education to patient re: try to get at least 15 minutes of natural sunlight daily when possible; eat a Vitamin D rich diet ; Reviewed medications with patient and discussed importance of medication adherence, take Vitamin D supplement exactly as prescribed ; Discussed plans with patient for ongoing care management follow up and provided patient with direct contact information for care management team;   Chronic Kidney Disease Interventions:  (Status:  New goal.) Long Term Goal Completed successful outbound call with patient, she requested to keep this call brief  Assessed the Patient understanding of chronic kidney disease    Evaluation of current treatment plan related to chronic kidney disease self management and patient's adherence to plan as established by provider      Reviewed prescribed diet increase daily water intake to 48-64 oz daily to help with hydration and kidney function Provided education on kidney disease progression    Review of patient  status, including review of consultant's reports, relevant laboratory and other test results, and medications completed Determined patient has a UTI, culture results are final with E.coli detected Determined patient's pharmacy did not receive an Rx from PCP provider, discussed this RN CM will notify her  PCP and request an Rx get sent to pharmacy today Discussed with patient the importance of starting the antibiotic as soon as possible to help prevent worsening UTI and or hospitalization, emphasized importance to complete full course of antibiotic  Collaborated with PCP provider, [name] FNP via secure chat to notify patient's pharmacy did not receive an Rx for patient in order to treat her newly diagnosed UTI, reply received Rx sent to preferred pharmacy; Nitrofurantoin (Macrobid) 100 mg capsule 2 x daily x 5 days Discussed plans with patient for ongoing care management follow up and provided patient with direct contact information for care management team Last practice recorded BP readings:  BP Readings from Last 3 Encounters:  05/23/21 122/60  02/14/21 124/78  02/01/21 132/80  Most recent eGFR/CrCl:  Lab Results  Component Value Date   EGFR 55 (L) 05/23/2021    No components found for: CRCL   Diabetes Interventions:  (Status:  Goal on track:  Yes.) Long Term Goal Completed successful outbound call with patient, she requested to keep this call brief  Assessed patient's understanding of A1c goal: <6.5% Reviewed medications with patient and discussed importance of medication adherence Review of patient status, including review of consultants reports, relevant laboratory and other test results, and medications completed Discussed plans with patient for ongoing care management follow up and provided patient with direct contact information for care management team Lab Results  Component Value Date   HGBA1C 7.6 (H) 05/23/2021   Patient Goals/Self-Care Activities: Take all medications as prescribed Attend all scheduled provider appointments Call pharmacy for medication refills 3-7 days in advance of running out of medications Perform IADL's (shopping, preparing meals, housekeeping, managing finances) independently Call provider office for new concerns or questions  drink 6 to 8  glasses of water each day manage portion size Start antibiotic for UTI and complete full course of medicine  Follow Up Plan:  Telephone follow up appointment with care management team member scheduled for:  08/11/21     [name], RN, BSN, CCM Care Management Coordinator [place] [facility]  Direct Phone: [phone]

## 2021-06-02 NOTE — Patient Instructions (Signed)
Visit Information  Thank you for taking time to visit with me today. Please don't hesitate to contact me if I can be of assistance to you before our next scheduled telephone appointment.  Following are the goals we discussed today:  (Copy and paste patient goals from clinical care plan here)  Our next appointment is by telephone on 08/11/21 at 12:45 PM   Please call the care guide team at [phone] if you need to cancel or reschedule your appointment.   If you are experiencing a Mental Health or [place] or need someone to talk to, please call 1-800-273-TALK (toll free, 24 hour hotline)   Patient verbalizes understanding of instructions and care plan provided today and agrees to view in [place]. Active MyChart status confirmed with patient.    [name], RN, BSN, CCM Care Management Coordinator [place] [facility]  Direct Phone: [phone]

## 2021-06-09 ENCOUNTER — Telehealth: Payer: Self-pay

## 2021-06-09 ENCOUNTER — Telehealth: Payer: Medicare Other

## 2021-06-09 NOTE — Telephone Encounter (Signed)
°  Care Management   Follow Up Note   06/09/2021 Name: Joanna Sandoval MRN: [id number] DOB: 1945-12-26   Referred by: [name], FNP Reason for referral : Chronic Care Management (RN CM Follow up call )   An unsuccessful telephone outreach was attempted today. The patient was referred to the case management team for assistance with care management and care coordination.   Follow Up Plan: A HIPPA compliant phone message was left for the patient providing contact information and requesting a return call.   [name], RN, BSN, CCM Care Management Coordinator [place] [facility]  Direct Phone: [phone]

## 2021-06-14 DIAGNOSIS — I10 Essential (primary) hypertension: Secondary | ICD-10-CM | POA: Diagnosis not present

## 2021-06-14 DIAGNOSIS — E119 Type 2 diabetes mellitus without complications: Secondary | ICD-10-CM | POA: Diagnosis not present

## 2021-06-24 ENCOUNTER — Telehealth: Payer: Self-pay

## 2021-06-24 NOTE — Chronic Care Management (AMB) (Unsigned)
Chronic Care Management Pharmacy Assistant   Name: Miakoda Mcmillion  MRN: [id number] DOB: 1945/12/28  Reason for Encounter: Disease State/ General  Recent office visits:  06-02-2021 Riley Churches, RN (CCM)  05-23-2021 [name], FNP. Glucose= 103, Creatinine= 1.06, eGFR= 55, Sodium= 145. Alkaline phosphate= 138.  Recent consult visits:  None  Hospital visits:  None in previous 6 months  Medications: Outpatient Encounter Medications as of 06/24/2021  Medication Sig   Accu-Chek Softclix Lancets lancets USE AS DIRECTED   albuterol (PROVENTIL HFA;VENTOLIN HFA) 108 (90 Base) MCG/ACT inhaler Inhale 2 puffs into the lungs every 6 (six) hours as needed.   atenolol (TENORMIN) 100 MG tablet Take 50 mg by mouth daily.    atorvastatin (LIPITOR) 20 MG tablet Take 1 tablet (20 mg total) by mouth daily.   Blood Glucose Monitoring Suppl (ACCU-CHEK GUIDE) w/Device KIT USE TO CHECK BLOOD SUGAR  TWICE DAILY AT 10AM AND 5PM   carboxymethylcellulose (REFRESH PLUS) 0.5 % SOLN 1 drop 3 (three) times daily as needed.   D3-50 50000 units capsule    diclofenac sodium (VOLTAREN) 1 % GEL Apply 2 g topically 4 (four) times daily.   glimepiride (AMARYL) 2 MG tablet TAKE 1 TABLET BY MOUTH IN  THE MORNING AND AT BEDTIME   glucose blood (ACCU-CHEK GUIDE) test strip Use as instructed   [name] KIT Use to test blood sugar twice daily as directed. E11.65   latanoprost (XALATAN) 0.005 % ophthalmic solution 1 drop at bedtime.   losartan (COZAAR) 50 MG tablet Take 50 mg by mouth daily.   Magnesium 200 MG TABS Take 1 tab by mouth daily with evening meal   naproxen (NAPROSYN) 500 MG tablet Take 1 tablet (500 mg total) by mouth 2 (two) times daily.   ondansetron (ZOFRAN ODT) 4 MG disintegrating tablet Take 1 tablet (4 mg total) by mouth every 8 (eight) hours as needed for nausea or vomiting.   Semaglutide (RYBELSUS) 14 MG TABS Take 1 tablet by mouth daily. Take 30 minutes before  breakfast   No facility-administered encounter medications on file as of 06/24/2021.  Contacted Melton Krebs for general disease state and medication adherence call.   Patient {is/isnt:25531} > 5 days past due for refill on the following medications per chart history:  [facility] Medications: Medication Name/mg Last Fill Days Supply    What concerns do you have about your medications?  The patient {denies/reports:25180} side effects with her medications.   How often do you forget or accidentally miss a dose? {missed doses:25554}  Do you use a pillbox? {yes/no:20286}  Are you having any problems getting your medications from your pharmacy? {yes/no:20286}  Has the cost of your medications been a concern? {yes/no:20286} If yes, what medication and is patient assistance available or has it been applied for?  Since last visit with CPP, no interventions have been made:   The patient has not had an ED visit since last contact.   The patient {denies/reports:25180} problems with their health.   she {denies/reports:25180}  concerns or questions for Juliane Lack, at this time.   Patient states BP and BG readings are as follows***  Fasting: *** Before meals: *** After meals: *** Bedtime: ***  Counseled patient on: ***only leave if you discussed with patient   Great job taking medications! (If good adherence)***  Importance of taking medication daily without missed doses  Benefits of adherence packaging or a pillbox   Access to CCM team for any cost, medication,  or pharmacy concerns  06-24-2021: 1st attempt left VM  Care Gaps: Covid booster overdue Shingrix overdue AWV 08-31-2021  Star Rating Drugs: Atorvastatin 40 mg- Last filled 05-14-2021 90 [facility] Rybelsus 14 mg- Last filled 04-13-2021 90 [facility] Losartan 50 mg- Last filled 05-14-2021 90 [facility] Glimepiride 2 mg- Last filled 05-06-2021 90 DS [place] Clinical Pharmacist Assistant [phone]

## 2021-06-30 ENCOUNTER — Other Ambulatory Visit: Payer: Self-pay | Admitting: Nurse Practitioner

## 2021-07-27 ENCOUNTER — Telehealth: Payer: Self-pay

## 2021-07-27 NOTE — Chronic Care Management (AMB) (Signed)
? ? ?Chronic Care Management ?Pharmacy Assistant  ? ?Name: Joanna Sandoval  MRN: [id number] DOB: [birth year] ? ?Reason for Encounter: Disease State/ Diabetes ? ?Recent office visits:  ?None ? ?Recent consult visits:  ?None ? ?Hospital visits:  ?None in previous 6 months ? ?Medications: ?Outpatient Encounter Medications as of 07/27/2021  ?Medication Sig  ? Accu-Chek Softclix Lancets lancets USE AS DIRECTED  ? albuterol (PROVENTIL HFA;VENTOLIN HFA) 108 (90 Base) MCG/ACT inhaler Inhale 2 puffs into the lungs every 6 (six) hours as needed.  ? atenolol (TENORMIN) 100 MG tablet Take 50 mg by mouth daily.   ? atorvastatin (LIPITOR) 20 MG tablet Take 1 tablet (20 mg total) by mouth daily.  ? Blood Glucose Monitoring Suppl (ACCU-CHEK GUIDE) w/Device KIT USE TO CHECK BLOOD SUGAR  TWICE DAILY AT 10AM AND 5PM  ? carboxymethylcellulose (REFRESH PLUS) 0.5 % SOLN 1 drop 3 (three) times daily as needed.  ? D3-50 50000 units capsule   ? diclofenac sodium (VOLTAREN) 1 % GEL Apply 2 g topically 4 (four) times daily.  ? glimepiride (AMARYL) 2 MG tablet TAKE 1 TABLET BY MOUTH IN  THE MORNING AND AT BEDTIME  ? glucose blood (ACCU-CHEK GUIDE) test strip Use as instructed  ? [name] KIT Use to test blood sugar twice daily as directed. E11.65  ? latanoprost (XALATAN) 0.005 % ophthalmic solution 1 drop at bedtime.  ? losartan (COZAAR) 50 MG tablet Take 50 mg by mouth daily.  ? Magnesium 200 MG TABS Take 1 tab by mouth daily with evening meal  ? naproxen (NAPROSYN) 500 MG tablet Take 1 tablet (500 mg total) by mouth 2 (two) times daily.  ? ondansetron (ZOFRAN ODT) 4 MG disintegrating tablet Take 1 tablet (4 mg total) by mouth every 8 (eight) hours as needed for nausea or vomiting.  ? Semaglutide (RYBELSUS) 14 MG TABS Take 1 tablet by mouth daily. Take 30 minutes before breakfast  ? ?No facility-administered encounter medications on file as of 07/27/2021.  ?Recent Relevant Labs: ?Lab Results  ?Component Value  Date/Time  ? HGBA1C 7.6 (H) 05/23/2021 09:49 AM  ? HGBA1C 7.7 (H) 02/14/2021 11:17 AM  ? MICROALBUR 30 08/04/2020 09:46 AM  ? MICROALBUR 30 07/30/2019 10:58 AM  ?  ?Kidney Function ?Lab Results  ?Component Value Date/Time  ? CREATININE 1.06 (H) 05/23/2021 09:49 AM  ? CREATININE 0.83 02/14/2021 11:17 AM  ? GFRNONAA >60 11/29/2020 03:33 AM  ? GFRAA 70 01/29/2020 09:35 AM  ? ? ?Current antihyperglycemic regimen:  ?Glimepiride 2 mg daily ?Rybelsus 7 mg daily ? ?What recent interventions/DTPs have been made to improve glycemic control:  ?Educated on A1c and blood sugar goals; ?Exercise goal of 150 minutes per week; ?Benefits of routine self-monitoring of blood sugar; ?-Counseled to check feet daily and get yearly eye exams ?-Counseled on diet and exercise extensively ?Recommended to continue current medication ? ?Have there been any recent hospitalizations or ED visits since last visit with CPP? No ? ?Patient denies hypoglycemic symptoms ? ?Patient denies hyperglycemic symptoms ? ?How often are you checking your blood sugar? once daily ? ?What are your blood sugars ranging?  ?Fasting: 140, 120 ?Before meals: None ?After meals: None ?Bedtime: None ? ?During the week, how often does your blood glucose drop below 70? Never ? ?Are you checking your feet daily/regularly? Daily ? ?Adherence Review: ?Is the patient currently on a STATIN medication? Yes ?Is the patient currently on ACE/ARB medication? Yes ?Does the patient have >5 day gap between last estimated  fill dates? No ? ?Care Gaps: ?Covid booster overdue ?Shingrix overdue ?AWV 08-31-2021 ? ?Star Rating Drugs: ?Atorvastatin 40 mg- Last filled 05-14-2021 [facility] ?Rybelsus 7 mg- Last filled 07-07-2021 [facility] ?Losartan 50 mg- Last filled 05-14-2021 [facility] ?Glimepiride 2 mg- Last filled 05-06-2021 [facility] ? ?Malecca Hicks CMA ?Clinical Pharmacist Assistant ?[phone] ? ?

## 2021-08-09 ENCOUNTER — Encounter: Payer: Self-pay | Admitting: Nurse Practitioner

## 2021-08-09 ENCOUNTER — Ambulatory Visit (INDEPENDENT_AMBULATORY_CARE_PROVIDER_SITE_OTHER): Payer: Medicare Other | Admitting: Nurse Practitioner

## 2021-08-09 VITALS — BP 122/80 | HR 98 | Temp 97.7°F | Ht 62.0 in | Wt 205.0 lb

## 2021-08-09 DIAGNOSIS — Z23 Encounter for immunization: Secondary | ICD-10-CM

## 2021-08-09 DIAGNOSIS — E1169 Type 2 diabetes mellitus with other specified complication: Secondary | ICD-10-CM | POA: Diagnosis not present

## 2021-08-09 DIAGNOSIS — R9431 Abnormal electrocardiogram [ECG] [EKG]: Secondary | ICD-10-CM | POA: Diagnosis not present

## 2021-08-09 DIAGNOSIS — Z0000 Encounter for general adult medical examination without abnormal findings: Secondary | ICD-10-CM | POA: Diagnosis not present

## 2021-08-09 DIAGNOSIS — I10 Essential (primary) hypertension: Secondary | ICD-10-CM

## 2021-08-09 DIAGNOSIS — Z6837 Body mass index (BMI) 37.0-37.9, adult: Secondary | ICD-10-CM

## 2021-08-09 DIAGNOSIS — E119 Type 2 diabetes mellitus without complications: Secondary | ICD-10-CM

## 2021-08-09 DIAGNOSIS — E66812 Obesity, class 2: Secondary | ICD-10-CM

## 2021-08-09 DIAGNOSIS — E6609 Other obesity due to excess calories: Secondary | ICD-10-CM

## 2021-08-09 DIAGNOSIS — Z79899 Other long term (current) drug therapy: Secondary | ICD-10-CM

## 2021-08-09 LAB — POCT URINALYSIS DIPSTICK
Bilirubin, UA: NEGATIVE
Blood, UA: NEGATIVE
Glucose, UA: NEGATIVE
Ketones, UA: NEGATIVE
Leukocytes, UA: NEGATIVE
Nitrite, UA: NEGATIVE
Protein, UA: NEGATIVE
Spec Grav, UA: >=1.03 — AB (ref 1.010–1.025)
Urobilinogen, UA: 0.2 E.U./dL
pH, UA: 5 (ref 5.0–8.0)

## 2021-08-09 MED ORDER — RYBELSUS 7 MG PO TABS: 1.0000 | ORAL_TABLET | Freq: Every day | ORAL | 1 refills | Status: DC

## 2021-08-09 NOTE — Progress Notes (Signed)
This visit occurred during the SARS-CoV-2 public health emergency.  Safety protocols were in place, including screening questions prior to the visit, additional usage of staff PPE, and extensive cleaning of exam room while observing appropriate contact time as indicated for disinfecting solutions.  Subjective:     Patient ID: Joanna Sandoval , female    DOB: 1945-06-12 , 76 y.o.   MRN: [id number]   Chief Complaint  Patient presents with   Annual Exam    HPI  Here for HM.  She is not driving as much due to having some hand shaking.   Wt Readings from Last 3 Encounters: 08/09/21 : 205 lb (93 kg) 05/23/21 : 204 lb 6.4 oz (92.7 kg) 02/14/21 : 206 lb (93.4 kg)    Diabetes She presents for her follow-up diabetic visit. She has type 2 diabetes mellitus. Her disease course has been worsening. Pertinent negatives for hypoglycemia include no dizziness or headaches. Pertinent negatives for diabetes include no chest pain, no fatigue, no polydipsia, no polyphagia and no polyuria. Symptoms are resolved. There are no diabetic complications. Risk factors for coronary artery disease include obesity, hypertension, diabetes mellitus, dyslipidemia, sedentary lifestyle and post-menopausal. Current diabetic treatment includes oral agent (dual therapy). She is compliant with treatment most of the time. Her weight is stable. She is following a generally unhealthy diet. When asked about meal planning, she reported none. She has not had a previous visit with a dietitian. She participates in exercise every other day. Her home blood glucose trend is decreasing steadily. (Reports her blood sugar has been from 110-130) She does not see a podiatrist.Eye exam is current (done 09/22/2019 neg retinopathy).  Hypertension This is a chronic problem. The current episode started more than 1 year ago. The problem has been gradually improving since onset. The problem is controlled. Pertinent negatives include no chest pain,  headaches or palpitations. There are no associated agents to hypertension. Risk factors for coronary artery disease include obesity, post-menopausal state, sedentary lifestyle and diabetes mellitus. Compliance problems include exercise.  There is no history of angina or kidney disease. There is no history of chronic renal disease or sleep apnea.    Past Medical History:  Diagnosis Date   Arthritis    Diabetes mellitus    Hypercholesteremia    Hypertension    Post-menopausal bleeding    Postmenopausal bleeding 08/30/2011   Normal EBX 09/2011.     Family History  Problem Relation Age of Onset   Hypertension Mother    Hypertension Father    Cancer Father        ?lung   Diabetes Sister      Current Outpatient Medications:    Accu-Chek Softclix Lancets lancets, USE AS DIRECTED, Disp: 200 each, Rfl: 3   albuterol (PROVENTIL HFA;VENTOLIN HFA) 108 (90 Base) MCG/ACT inhaler, Inhale 2 puffs into the lungs every 6 (six) hours as needed., Disp: , Rfl:    atenolol (TENORMIN) 100 MG tablet, Take 50 mg by mouth daily. , Disp: , Rfl:    atorvastatin (LIPITOR) 20 MG tablet, Take 1 tablet (20 mg total) by mouth daily., Disp: 90 tablet, Rfl: 1   Blood Glucose Monitoring Suppl (ACCU-CHEK GUIDE) w/Device KIT, USE TO CHECK BLOOD SUGAR  TWICE DAILY AT 10AM AND 5PM, Disp: 1 kit, Rfl: 1   carboxymethylcellulose (REFRESH PLUS) 0.5 % SOLN, 1 drop 3 (three) times daily as needed., Disp: , Rfl:    D3-50 50000 units capsule, , Disp: , Rfl: 0   diclofenac sodium (VOLTAREN) 1 %  GEL, Apply 2 g topically 4 (four) times daily., Disp: 100 g, Rfl: 1   glimepiride (AMARYL) 2 MG tablet, TAKE 1 TABLET BY MOUTH IN  THE MORNING AND AT BEDTIME, Disp: 180 tablet, Rfl: 3   glucose blood (ACCU-CHEK GUIDE) test strip, Use as instructed, Disp: 100 each, Rfl: 12   [name] KIT, Use to test blood sugar twice daily as directed. E11.65, Disp: 1 kit, Rfl: 12   latanoprost (XALATAN) 0.005 % ophthalmic  solution, 1 drop at bedtime., Disp: , Rfl:    losartan (COZAAR) 50 MG tablet, Take 50 mg by mouth daily., Disp: , Rfl:    Magnesium 200 MG TABS, Take 1 tab by mouth daily with evening meal, Disp: 30 tablet, Rfl: 2   naproxen (NAPROSYN) 500 MG tablet, Take 1 tablet (500 mg total) by mouth 2 (two) times daily., Disp: 30 tablet, Rfl: 0   ondansetron (ZOFRAN ODT) 4 MG disintegrating tablet, Take 1 tablet (4 mg total) by mouth every 8 (eight) hours as needed for nausea or vomiting., Disp: 20 tablet, Rfl: 0   dapagliflozin propanediol (FARXIGA) 10 MG TABS tablet, Take 1 tablet (10 mg total) by mouth daily before breakfast., Disp: 30 tablet, Rfl: 1   RYBELSUS 7 MG TABS, Take 1 tablet by mouth daily., Disp: 90 tablet, Rfl: 1   No Known Allergies    The patient states she is post menopausal status  No LMP recorded. Patient is postmenopausal.. Negative for Dysmenorrhea and Negative for Menorrhagia. Negative for: breast discharge, breast lump(s), breast pain and breast self exam. Associated symptoms include abnormal vaginal bleeding. Pertinent negatives include abnormal bleeding (hematology), anxiety, decreased libido, depression, difficulty falling sleep, dyspareunia, history of infertility, nocturia, sexual dysfunction, sleep disturbances, urinary incontinence, urinary urgency, vaginal discharge and vaginal itching. Diet regular; she continues to snack since she is fully retired. She has been trying to keep herself busy. She is trying to stay away from sweets and and processed foods. The patient states her exercise level is moderate. She is walking two times a day for 30 minutes each time  The patient's tobacco use is:  Social History   Tobacco Use  Smoking Status Former   Types: Cigarettes  Smokeless Tobacco Never   She has been exposed to passive smoke. The patient's alcohol use is:  Social History   Substance and Sexual Activity  Alcohol Use No    Review of Systems  Constitutional: Negative.   Negative for fatigue.  HENT: Negative.    Eyes: Negative.   Cardiovascular:  Negative for chest pain, palpitations and leg swelling.  Endocrine: Negative.  Negative for polydipsia, polyphagia and polyuria.  Genitourinary: Negative.   Musculoskeletal: Negative.   Skin: Negative.   Allergic/Immunologic: Negative.   Neurological: Negative.  Negative for dizziness and headaches.  Hematological: Negative.   Psychiatric/Behavioral: Negative.      Today's Vitals   08/09/21 0855  BP: 122/80  Pulse: 98  Temp: 97.7 F (36.5 C)  TempSrc: Oral  SpO2: 98%  Weight: 205 lb (93 kg)  Height: $Remove'5\' 2"'PFFFZgq$  (1.575 m)   Body mass index is 37.49 kg/m.   Objective:  Physical Exam Vitals reviewed.  Constitutional:      General: She is not in acute distress.    Appearance: Normal appearance. She is well-developed. She is obese.  HENT:     Head: Normocephalic and atraumatic.     Right Ear: Hearing, tympanic membrane, ear canal and external ear normal. There is no impacted cerumen.  Left Ear: Hearing, tympanic membrane, ear canal and external ear normal. There is no impacted cerumen.     Nose:     Comments: Deferred - mask    Mouth/Throat:     Comments: Deferred - mask Eyes:     General: Lids are normal.     Extraocular Movements: Extraocular movements intact.     Conjunctiva/sclera: Conjunctivae normal.     Pupils: Pupils are equal, round, and reactive to light.     Funduscopic exam:    Right eye: No papilledema.        Left eye: No papilledema.  Neck:     Thyroid: No thyroid mass.     Vascular: No carotid bruit.  Cardiovascular:     Rate and Rhythm: Normal rate and regular rhythm.     Pulses: Normal pulses.     Heart sounds: Normal heart sounds. No murmur heard. Pulmonary:     Effort: Pulmonary effort is normal. No respiratory distress.     Breath sounds: Normal breath sounds. No wheezing.  Abdominal:     General: Abdomen is flat. Bowel sounds are normal. There is no distension.      Palpations: Abdomen is soft. There is no mass.     Tenderness: There is no abdominal tenderness.  Musculoskeletal:        General: No swelling. Normal range of motion.     Cervical back: Full passive range of motion without pain, normal range of motion and neck supple.     Right lower leg: No edema.     Left lower leg: No edema.  Skin:    General: Skin is warm and dry.     Capillary Refill: Capillary refill takes less than 2 seconds.  Neurological:     General: No focal deficit present.     Mental Status: She is alert and oriented to person, place, and time.     Cranial Nerves: No cranial nerve deficit.     Sensory: No sensory deficit.     Motor: No weakness.  Psychiatric:        Mood and Affect: Mood normal.        Behavior: Behavior normal.        Thought Content: Thought content normal.        Judgment: Judgment normal.        Assessment And Plan:     1. Health maintenance examination Behavior modifications discussed and diet history reviewed.   Pt will continue to exercise regularly and modify diet with low GI, plant based foods and decrease intake of processed foods.  Recommend intake of daily multivitamin, Vitamin D, and calcium.  Recommend mammogram and colonoscopy for preventive screenings, as well as recommend immunizations that include influenza, TDAP, and Shingles (given in office) - POCT Urinalysis Dipstick (81002) - Microalbumin / creatinine urine ratio - EKG 12-Lead  2. Encounter for immunization Prevnar 20 immunization given in office - Pneumococcal conjugate vaccine 20-valent  3. Class 2 obesity due to excess calories with body mass index (BMI) of 37.0 to 37.9 in adult, unspecified whether serious comorbidity present Chronic Discussed healthy diet and regular exercise options  Encouraged to exercise at least 150 minutes per week with 2 days of strength training as tolerated  4. Type 2 diabetes mellitus without complication, without long-term current use of  insulin (HCC) Comments: Diabetes is fairly controlled, continue Rybelsus she was unable to tolerate the higher dose of Rybelsus. - POCT Urinalysis Dipstick (81002) - Microalbumin / creatinine urine ratio -  EKG 12-Lead - CMP14+EGFR - CBC - Hemoglobin A1c - Lipid panel - RYBELSUS 7 MG TABS; Take 1 tablet by mouth daily.  Dispense: 90 tablet; Refill: 1  5. Essential hypertension Comments: Blood pressure is controlled, continue medications.  - POCT Urinalysis Dipstick (81002) - Microalbumin / creatinine urine ratio - EKG 12-Lead - CMP14+EGFR - CBC - Hemoglobin A1c - Lipid panel  6. Abnormal EKG Comments: She has a First degree heart block - she is established with Dr Terrence Dupont. We will send a copy to his office.  7. Other long term (current) drug therapy  - CBC    Patient was given opportunity to ask questions. Patient verbalized understanding of the plan and was able to repeat key elements of the plan. All questions were answered to their satisfaction.   [name], FNP   I, [name], FNP, have reviewed all documentation for this visit. The documentation on 08/09/21 for the exam, diagnosis, procedures, and orders are all accurate and complete.   THE PATIENT IS ENCOURAGED TO PRACTICE SOCIAL DISTANCING DUE TO THE COVID-19 PANDEMIC.

## 2021-08-09 NOTE — Progress Notes (Deleted)
I,Deuntae Kocsis T Tayonna Bacha,acting as a Education administrator for [name], FNP.,have documented all relevant documentation on the behalf of [name], FNP,as directed by  [name], FNP while in the presence of [name], [place].  This visit occurred during the SARS-CoV-2 public health emergency.  Safety protocols were in place, including screening questions prior to the visit, additional usage of staff PPE, and extensive cleaning of exam room while observing appropriate contact time as indicated for disinfecting solutions.  Subjective:     Patient ID: Joanna Sandoval , female    DOB: 1946/04/08 , 76 y.o.   MRN: [id number]   Chief Complaint  Patient presents with   Annual Exam    HPI  Pt here for HM. She reports compliance with medications, she does have a concern about Rybelsus 30m. She feels as if the medication makes her feel bloated and nauseated, so she feels that upping the dose will not be a good idea.      Past Medical History:  Diagnosis Date   Arthritis    Diabetes mellitus    Hypercholesteremia    Hypertension    Post-menopausal bleeding    Postmenopausal bleeding 08/30/2011   Normal EBX 09/2011.     Family History  Problem Relation Age of Onset   Hypertension Mother    Hypertension Father    Cancer Father        ?lung   Diabetes Sister      Current Outpatient Medications:    Accu-Chek Softclix Lancets lancets, USE AS DIRECTED, Disp: 200 each, Rfl: 3   albuterol (PROVENTIL HFA;VENTOLIN HFA) 108 (90 Base) MCG/ACT inhaler, Inhale 2 puffs into the lungs every 6 (six) hours as needed., Disp: , Rfl:    atenolol (TENORMIN) 100 MG tablet, Take 50 mg by mouth daily. , Disp: , Rfl:    atorvastatin (LIPITOR) 20 MG tablet, Take 1 tablet (20 mg total) by mouth daily., Disp: 90 tablet, Rfl: 1   Blood Glucose Monitoring Suppl (ACCU-CHEK GUIDE) w/Device KIT, USE TO CHECK BLOOD SUGAR  TWICE DAILY AT 10AM AND 5PM, Disp: 1 kit, Rfl: 1   carboxymethylcellulose (REFRESH PLUS) 0.5 % SOLN, 1  drop 3 (three) times daily as needed., Disp: , Rfl:    D3-50 50000 units capsule, , Disp: , Rfl: 0   diclofenac sodium (VOLTAREN) 1 % GEL, Apply 2 g topically 4 (four) times daily., Disp: 100 g, Rfl: 1   glimepiride (AMARYL) 2 MG tablet, TAKE 1 TABLET BY MOUTH IN  THE MORNING AND AT BEDTIME, Disp: 180 tablet, Rfl: 3   glucose blood (ACCU-CHEK GUIDE) test strip, Use as instructed, Disp: 100 each, Rfl: 12   [name] KIT, Use to test blood sugar twice daily as directed. E11.65, Disp: 1 kit, Rfl: 12   latanoprost (XALATAN) 0.005 % ophthalmic solution, 1 drop at bedtime., Disp: , Rfl:    losartan (COZAAR) 50 MG tablet, Take 50 mg by mouth daily., Disp: , Rfl:    Magnesium 200 MG TABS, Take 1 tab by mouth daily with evening meal, Disp: 30 tablet, Rfl: 2   naproxen (NAPROSYN) 500 MG tablet, Take 1 tablet (500 mg total) by mouth 2 (two) times daily., Disp: 30 tablet, Rfl: 0   ondansetron (ZOFRAN ODT) 4 MG disintegrating tablet, Take 1 tablet (4 mg total) by mouth every 8 (eight) hours as needed for nausea or vomiting., Disp: 20 tablet, Rfl: 0   RYBELSUS 7 MG TABS, Take 1 tablet by mouth daily., Disp: , Rfl:  Semaglutide (RYBELSUS) 14 MG TABS, Take 1 tablet by mouth daily. Take 30 minutes before breakfast (Patient not taking: Reported on 08/09/2021), Disp: 90 tablet, Rfl: 1   No Known Allergies    The patient states she uses {contraceptive methods:5051} for birth control. Last LMP was No LMP recorded. Patient is postmenopausal.. {Dysmenorrhea-menorrhagia:21918}. Negative for: breast discharge, breast lump(s), breast pain and breast self exam. Associated symptoms include abnormal vaginal bleeding. Pertinent negatives include abnormal bleeding (hematology), anxiety, decreased libido, depression, difficulty falling sleep, dyspareunia, history of infertility, nocturia, sexual dysfunction, sleep disturbances, urinary incontinence, urinary urgency, vaginal discharge and vaginal  itching. Diet regular.The patient states her exercise level is    . The patient's tobacco use is:  Social History   Tobacco Use  Smoking Status Former   Types: Cigarettes  Smokeless Tobacco Never  . She has been exposed to passive smoke. The patient's alcohol use is:  Social History   Substance and Sexual Activity  Alcohol Use No  . Additional information: Last pap ***, next one scheduled for ***.    Review of Systems   Today's Vitals   08/09/21 0855  BP: 122/80  Pulse: 98  Temp: 97.7 F (36.5 C)  TempSrc: Oral  SpO2: 98%  Weight: 205 lb (93 kg)  Height: _0  (1.575 m)   Body mass index is 37.49 kg/m.  Wt Readings from Last 3 Encounters:  08/09/21 205 lb (93 kg)  05/23/21 204 lb 6.4 oz (92.7 kg)  02/14/21 206 lb (93.4 kg)    Objective:  Physical Exam      Assessment And Plan:     1. Health maintenance examination - POCT Urinalysis Dipstick (81002) - Microalbumin / creatinine urine ratio - EKG 12-Lead  2. Type 2 diabetes mellitus without complication, without long-term current use of insulin (HCC) - POCT Urinalysis Dipstick (81002) - Microalbumin / creatinine urine ratio - EKG 12-Lead  3. Essential hypertension - POCT Urinalysis Dipstick (81002) - Microalbumin / creatinine urine ratio - EKG 12-Lead  4. Class 2 obesity due to excess calories with body mass index (BMI) of 37.0 to 37.9 in adult, unspecified whether serious comorbidity present - POCT Urinalysis Dipstick (81002) - Microalbumin / creatinine urine ratio - EKG 12-Lead     Patient was given opportunity to ask questions. Patient verbalized understanding of the plan and was able to repeat key elements of the plan. All questions were answered to their satisfaction.   Debbora Dus, CMA   I, Debbora Dus, CMA, have reviewed all documentation for this visit. The documentation on 08/09/21 for the exam, diagnosis, procedures, and orders are all accurate and complete.  THE PATIENT IS  ENCOURAGED TO PRACTICE SOCIAL DISTANCING DUE TO THE COVID-19 PANDEMIC.

## 2021-08-09 NOTE — Patient Instructions (Signed)

## 2021-08-10 LAB — CBC
Hematocrit: 42.3 % (ref 34.0–46.6)
Hemoglobin: 13.6 g/dL (ref 11.1–15.9)
MCH: 27.9 pg (ref 26.6–33.0)
MCHC: 32.2 g/dL (ref 31.5–35.7)
MCV: 87 fL (ref 79–97)
Platelets: 302 10*3/uL (ref 150–450)
RBC: 4.87 x10E6/uL (ref 3.77–5.28)
RDW: 18.2 % — ABNORMAL HIGH (ref 11.7–15.4)
WBC: 12.7 10*3/uL — ABNORMAL HIGH (ref 3.4–10.8)

## 2021-08-10 LAB — LIPID PANEL
Chol/HDL Ratio: 2.8 ratio (ref 0.0–4.4)
Cholesterol, Total: 145 mg/dL (ref 100–199)
HDL: 52 mg/dL (ref >39)
LDL Chol Calc (NIH): 73 mg/dL (ref 0–99)
Triglycerides: 108 mg/dL (ref 0–149)
VLDL Cholesterol Cal: 20 mg/dL (ref 5–40)

## 2021-08-10 LAB — CMP14+EGFR
ALT: 9 IU/L (ref 0–32)
AST: 12 IU/L (ref 0–40)
Albumin/Globulin Ratio: 1.3 (ref 1.2–2.2)
Albumin: 4.3 g/dL (ref 3.7–4.7)
Alkaline Phosphatase: 130 IU/L — ABNORMAL HIGH (ref 44–121)
BUN/Creatinine Ratio: 17 (ref 12–28)
BUN: 16 mg/dL (ref 8–27)
Bilirubin Total: 0.5 mg/dL (ref 0.0–1.2)
CO2: 27 mmol/L (ref 20–29)
Calcium: 10.4 mg/dL — ABNORMAL HIGH (ref 8.7–10.3)
Chloride: 104 mmol/L (ref 96–106)
Creatinine, Ser: 0.94 mg/dL (ref 0.57–1.00)
Globulin, Total: 3.3 g/dL (ref 1.5–4.5)
Glucose: 88 mg/dL (ref 70–99)
Potassium: 4.7 mmol/L (ref 3.5–5.2)
Sodium: 145 mmol/L — ABNORMAL HIGH (ref 134–144)
Total Protein: 7.6 g/dL (ref 6.0–8.5)
eGFR: 64 mL/min/{1.73_m2} (ref >59)

## 2021-08-10 LAB — HEMOGLOBIN A1C
Est. average glucose Bld gHb Est-mCnc: 169 mg/dL
Hgb A1c MFr Bld: 7.5 % — ABNORMAL HIGH (ref 4.8–5.6)

## 2021-08-10 LAB — MICROALBUMIN / CREATININE URINE RATIO
Creatinine, Urine: 172.9 mg/dL
Microalb/Creat Ratio: 10 mg/g creat (ref 0–29)
Microalbumin, Urine: 17.1 ug/mL

## 2021-08-11 ENCOUNTER — Telehealth: Payer: Medicare Other

## 2021-08-11 ENCOUNTER — Ambulatory Visit (INDEPENDENT_AMBULATORY_CARE_PROVIDER_SITE_OTHER): Payer: Medicare Other

## 2021-08-11 DIAGNOSIS — I700 Atherosclerosis of aorta: Secondary | ICD-10-CM

## 2021-08-11 DIAGNOSIS — N200 Calculus of kidney: Secondary | ICD-10-CM

## 2021-08-11 DIAGNOSIS — I10 Essential (primary) hypertension: Secondary | ICD-10-CM

## 2021-08-11 DIAGNOSIS — E559 Vitamin D deficiency, unspecified: Secondary | ICD-10-CM

## 2021-08-11 DIAGNOSIS — E119 Type 2 diabetes mellitus without complications: Secondary | ICD-10-CM

## 2021-08-11 NOTE — Patient Instructions (Signed)
Visit Information ? ?Thank you for taking time to visit with me today. Please don't hesitate to contact me if I can be of assistance to you before our next scheduled telephone appointment. ? ?Following are the goals we discussed today:  ?(Copy and paste patient goals from clinical care plan here) ? ?Our next appointment is by telephone on 10/11/21 at 10:30 AM ? ?Please call the care guide team at [phone] if you need to cancel or reschedule your appointment.  ? ?If you are experiencing a Mental Health or [place] or need someone to talk to, please call [phone]-TALK (toll free, 24 hour hotline)  ? ?Patient verbalizes understanding of instructions and care plan provided today and agrees to view in [place]. Active MyChart status confirmed with patient.   ? ?[name], RN, BSN, CCM ?Care Management Coordinator ?[facility]  ?Direct Phone: [phone] ? ? ?

## 2021-08-11 NOTE — Chronic Care Management (AMB) (Signed)
?Chronic Care Management  ? ?CCM RN Visit Note ? ?08/11/2021 ?Name: Joanna Sandoval MRN: [id number] DOB: Feb 13, 1946 ? ?Subjective: ?Joanna Sandoval is a 76 y.o. year old female who is a primary care patient of [name], [place]. The care management team was consulted for assistance with disease management and care coordination needs.   ? ?Engaged with patient by telephone for follow up visit in response to provider referral for case management and/or care coordination services.  ? ?Consent to Services:  ?The patient was given information about Chronic Care Management services, agreed to services, and gave verbal consent prior to initiation of services.  Please see initial visit note for detailed documentation.  ? ?Patient agreed to services and verbal consent obtained.  ? ?Assessment: Review of patient past medical history, allergies, medications, health status, including review of consultants reports, laboratory and other test data, was performed as part of comprehensive evaluation and provision of chronic care management services.  ? ?SDOH (Social Determinants of Health) assessments and interventions performed:  Yes, SW referral to assist with food resources and educate patient regarding Medicaid eligibility  ? ?CCM Care Plan ? ?No Known Allergies ? ?Outpatient Encounter Medications as of 08/11/2021  ?Medication Sig  ? Accu-Chek Softclix Lancets lancets USE AS DIRECTED  ? albuterol (PROVENTIL HFA;VENTOLIN HFA) 108 (90 Base) MCG/ACT inhaler Inhale 2 puffs into the lungs every 6 (six) hours as needed.  ? atenolol (TENORMIN) 100 MG tablet Take 50 mg by mouth daily.   ? atorvastatin (LIPITOR) 20 MG tablet Take 1 tablet (20 mg total) by mouth daily.  ? Blood Glucose Monitoring Suppl (ACCU-CHEK GUIDE) w/Device KIT USE TO CHECK BLOOD SUGAR  TWICE DAILY AT 10AM AND 5PM  ? carboxymethylcellulose (REFRESH PLUS) 0.5 % SOLN 1 drop 3 (three) times daily as needed.  ? D3-50 50000 units capsule   ? diclofenac sodium (VOLTAREN) 1 %  GEL Apply 2 g topically 4 (four) times daily.  ? glimepiride (AMARYL) 2 MG tablet TAKE 1 TABLET BY MOUTH IN  THE MORNING AND AT BEDTIME  ? glucose blood (ACCU-CHEK GUIDE) test strip Use as instructed  ? [name] KIT Use to test blood sugar twice daily as directed. E11.65  ? latanoprost (XALATAN) 0.005 % ophthalmic solution 1 drop at bedtime.  ? losartan (COZAAR) 50 MG tablet Take 50 mg by mouth daily.  ? Magnesium 200 MG TABS Take 1 tab by mouth daily with evening meal  ? naproxen (NAPROSYN) 500 MG tablet Take 1 tablet (500 mg total) by mouth 2 (two) times daily.  ? ondansetron (ZOFRAN ODT) 4 MG disintegrating tablet Take 1 tablet (4 mg total) by mouth every 8 (eight) hours as needed for nausea or vomiting.  ? RYBELSUS 7 MG TABS Take 1 tablet by mouth daily.  ? ?No facility-administered encounter medications on file as of 08/11/2021.  ? ? ?Patient Active Problem List  ? Diagnosis Date Noted  ? Colon cancer screening 05/05/2020  ? Morbid obesity ([place]) 05/05/2020  ? Type 2 diabetes mellitus without complication, without long-term current use of insulin ([place]) 07/31/2018  ? Essential hypertension 04/30/2018  ? Fibroid 11/11/2011  ? ? ?Conditions to be addressed/monitored: Type 2 diabetes mellitus, Essential HTN, Vitamin D deficiency, Bilateral Nephrolithiasis, Aortic Atherosclerosis  ? ?Care Plan : RN Care Manager Plan of Care  ?Updates made by [name], RN since 08/11/2021 12:00 AM  ?  ? ?Problem: No plan of care established for management of chronic disease states (Type 2 diabetes mellitus,  Essential HTN, Vitamin D deficiency, Bilateral Nephrolithiasis, Aortic Atherosclerosis)   ?Priority: High  ?  ? ?Long-Range Goal: Development of plan of care for chronic disease management for Type 2 diabetes mellitus, Essential HTN, Vitamin D deficiency, Bilateral Nephrolithiasis, Aortic Atherosclerosis   ?Start Date: 02/16/2021  ?Expected End Date: 02/16/2022  ?Recent Progress: On track   ?Priority: High  ?Note:   ?Current Barriers:  ?Knowledge Deficits related to plan of care for management of Type 2 diabetes mellitus, Essential HTN, Vitamin D deficiency, Bilateral Nephrolithiasis, Aortic Atherosclerosis  ?Chronic Disease Management support and education needs related to Type 2 diabetes mellitus, Essential HTN, Vitamin D deficiency, Bilateral Nephrolithiasis, Aortic Atherosclerosis  ?Financial Constraints ? ?RNCM Clinical Goal(s):  ?Patient will verbalize basic understanding of  Type 2 diabetes mellitus, Essential HTN, Vitamin D deficiency, Bilateral Nephrolithiasis, Aortic Atherosclerosis  disease process and self health management plan   ?demonstrate Improved health management independence   ?continue to work with RN Care Manager to address care management and care coordination needs related to  Type 2 diabetes mellitus, Essential HTN, Vitamin D deficiency, Bilateral Nephrolithiasis, Aortic Atherosclerosis  ?will demonstrate ongoing self health care management ability    through collaboration with RN Care manager, provider, and care team.  ? ?Interventions: ?1:1 collaboration with primary care provider regarding development and update of comprehensive plan of care as evidenced by provider attestation and co-signature ?Inter-disciplinary care team collaboration (see longitudinal plan of care) ?Evaluation of current treatment plan related to  self management and patient's adherence to plan as established by provider ? ?Hyperlipidemia Interventions:  (Status:  Goal on track:  Yes.) Long Term Goal ?Medication review performed; medication list updated in electronic medical record.  ?Provider established cholesterol goals reviewed ?Counseled on importance of regular laboratory monitoring as prescribed ?Reviewed importance of limiting foods high in cholesterol ?Reviewed exercise goals and target of 150 minutes per week  ?Lipid Panel  ?   ?Component Value Date/Time  ? CHOL 145 08/09/2021 1018  ? TRIG 108  08/09/2021 1018  ? HDL 52 08/09/2021 1018  ? CHOLHDL 2.8 08/09/2021 1018  ? CHOLHDL 4.6 10/12/2006 0840  ? VLDL 16 10/12/2006 0840  ? [place] 73 08/09/2021 1018  ? LABVLDL 20 08/09/2021 1018  ?  ? ?Vitamin D deficiency Interventions: Status: (Condition stable.  Not addressed this visit.) Long Term Goal ?Evaluation of current treatment plan related to  Vitamin D deficiency , self-management and patient's adherence to plan as established by provider. ?Discussed plans with patient for ongoing care management follow up and provided patient with direct contact information for care management team ?Provided education to patient re: try to get at least 15 minutes of natural sunlight daily when possible; eat a Vitamin D rich diet ; ?Reviewed medications with patient and discussed importance of medication adherence, take Vitamin D supplement exactly as prescribed ; ?Discussed plans with patient for ongoing care management follow up and provided patient with direct contact information for care management team; ? ? Chronic Kidney Disease Interventions:  (Status:  Goal on track:  Yes.) Long Term Goal ?Assessed the Patient understanding of chronic kidney disease    ?Evaluation of current treatment plan related to chronic kidney disease self management and patient's adherence to plan as established by provider      ?Reviewed prescribed diet increase water intake to 48-64 oz daily  ?Advised patient, providing education and rationale, to monitor blood pressure daily and record, calling PCP for findings outside established parameters    ?Discussed complications of poorly controlled  blood pressure such as heart disease, stroke, circulatory complications, vision complications, kidney impairment, sexual dysfunction    ?Provided education on kidney disease progression    ?Last practice recorded BP readings:  ?BP Readings from Last 3 Encounters:  ?08/09/21 122/80  ?05/23/21 122/60  ?02/14/21 124/78  ?Most recent eGFR/CrCl:  ?Lab Results   ?Component Value Date  ? EGFR 64 08/09/2021  ?  No components found for: CRCL ? ? ?Diabetes Interventions:  (Status:  Goal on track:  Yes.) Long Term Goal ?Assessed patient's understanding of A1c goal: <7

## 2021-08-14 DIAGNOSIS — E119 Type 2 diabetes mellitus without complications: Secondary | ICD-10-CM

## 2021-08-14 DIAGNOSIS — I10 Essential (primary) hypertension: Secondary | ICD-10-CM

## 2021-08-16 ENCOUNTER — Telehealth: Payer: Medicare Other

## 2021-08-16 ENCOUNTER — Ambulatory Visit (INDEPENDENT_AMBULATORY_CARE_PROVIDER_SITE_OTHER): Payer: Medicare Other

## 2021-08-16 DIAGNOSIS — I700 Atherosclerosis of aorta: Secondary | ICD-10-CM

## 2021-08-16 DIAGNOSIS — I10 Essential (primary) hypertension: Secondary | ICD-10-CM

## 2021-08-16 DIAGNOSIS — E119 Type 2 diabetes mellitus without complications: Secondary | ICD-10-CM

## 2021-08-16 NOTE — Chronic Care Management (AMB) (Signed)
?Chronic Care Management  ? ? Social Work Note ? ?08/16/2021 ?Name: Joanna Sandoval MRN: [id number] DOB: 02/17/1946 ? ?Joanna Sandoval is a 76 y.o. year old female who is a primary care patient of [name], [place]. The CCM team was consulted to assist the patient with chronic disease management and/or care coordination needs related to:  DM II, HTN, Aortic Atherosclerosis, and Financial Strain .  ? ?Engaged with patient by telephone for follow up visit in response to provider referral for social work chronic care management and care coordination services.  ? ?Consent to Services:  ?The patient was given information about Chronic Care Management services, agreed to services, and gave verbal consent prior to initiation of services.  Please see initial visit note for detailed documentation.  ? ?Patient agreed to services and consent obtained.  ? ?Assessment: Review of patient past medical history, allergies, medications, and health status, including review of relevant consultants reports was performed today as part of a comprehensive evaluation and provision of chronic care management and care coordination services.    ? ?SDOH (Social Determinants of Health) assessments and interventions performed:  ?SDOH Interventions   ? ?Flowsheet Row Most Recent Value  ?SDOH Interventions   ?Food Insecurity Interventions Other (Comment)  [provided patient with a list of food pantry sites. Pt already receiving FNS]  ?Financial Strain Interventions Other (Comment)  [Resources provided for food pantry and dental clinics. Discussed opportunity to apply for Medicaid- app provided]  ?Housing Interventions Intervention Not Indicated  ?Transportation Interventions Intervention Not Indicated  ? ?  ?  ? ?Advanced Directives Status: Not addressed in this encounter. ? ?CCM Care Plan ? ?No Known Allergies ? ?Outpatient Encounter Medications as of 08/16/2021  ?Medication Sig  ? Accu-Chek Softclix Lancets lancets USE AS DIRECTED  ? albuterol  (PROVENTIL HFA;VENTOLIN HFA) 108 (90 Base) MCG/ACT inhaler Inhale 2 puffs into the lungs every 6 (six) hours as needed.  ? atenolol (TENORMIN) 100 MG tablet Take 50 mg by mouth daily.   ? atorvastatin (LIPITOR) 20 MG tablet Take 1 tablet (20 mg total) by mouth daily.  ? Blood Glucose Monitoring Suppl (ACCU-CHEK GUIDE) w/Device KIT USE TO CHECK BLOOD SUGAR  TWICE DAILY AT 10AM AND 5PM  ? carboxymethylcellulose (REFRESH PLUS) 0.5 % SOLN 1 drop 3 (three) times daily as needed.  ? D3-50 50000 units capsule   ? diclofenac sodium (VOLTAREN) 1 % GEL Apply 2 g topically 4 (four) times daily.  ? glimepiride (AMARYL) 2 MG tablet TAKE 1 TABLET BY MOUTH IN  THE MORNING AND AT BEDTIME  ? glucose blood (ACCU-CHEK GUIDE) test strip Use as instructed  ? [name] KIT Use to test blood sugar twice daily as directed. E11.65  ? latanoprost (XALATAN) 0.005 % ophthalmic solution 1 drop at bedtime.  ? losartan (COZAAR) 50 MG tablet Take 50 mg by mouth daily.  ? Magnesium 200 MG TABS Take 1 tab by mouth daily with evening meal  ? naproxen (NAPROSYN) 500 MG tablet Take 1 tablet (500 mg total) by mouth 2 (two) times daily.  ? ondansetron (ZOFRAN ODT) 4 MG disintegrating tablet Take 1 tablet (4 mg total) by mouth every 8 (eight) hours as needed for nausea or vomiting.  ? RYBELSUS 7 MG TABS Take 1 tablet by mouth daily.  ? ?No facility-administered encounter medications on file as of 08/16/2021.  ? ? ?Patient Active Problem List  ? Diagnosis Date Noted  ? Colon cancer screening 05/05/2020  ? Morbid obesity ([place]) 05/05/2020  ?  Type 2 diabetes mellitus without complication, without long-term current use of insulin ([place]) 07/31/2018  ? Essential hypertension 04/30/2018  ? Fibroid 11/11/2011  ? ? ?Conditions to be addressed/monitored: HTN, DMII, and Aortic Atherosclerosis ; Financial constraints related to costs of food and dental care ? ?Care Plan : Social Work Plan of Care  ?Updates made by [name] since  08/16/2021 12:00 AM  ?  ? ?Problem: Quality of Life (General Plan of Care)   ?  ? ?Goal: Quality of Life Maintained   ?Start Date: 08/16/2021  ?Priority: High  ?Note:   ?Current Barriers:  ?Chronic disease management support and education needs related to HTN, DM, and Aortic Atherosclerosis   ?Financial constraints related to costs of food and dental care ? ?Social Worker Clinical Goal(s):  ?patient will work with SW to identify and address any acute and/or chronic care coordination needs related to the self health management of HTN, DM, and Aortic Atherosclerosis   ?explore community resource options for unmet needs related to:  Financial Strain  ?SW Interventions:  ?Inter-disciplinary care team collaboration (see longitudinal plan of care) ?Collaboration with [name], FNP regarding development and update of comprehensive plan of care as evidenced by provider attestation and co-signature ?Collaboration with RN Care Manager who requests SW involvement with patients care coordination needs ?Telephonic visit completed with the patient who reports she is struggling to afford food since her benefit has been cut. Patient is currently receiving approximately $50 per month ?Determined the patient is interested in resources for local food pantries - resource list mailed to patients home ?Discussed the patient is in need of an extraction but is having trouble affording the copay amount. 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 Patients apartment complex does accept Section 8 and she hopes to switch to this once she receives the voucher ?Prior to ending today's call patient reports she has had diarrhea since Sunday- Advised the patient SW would collaborate with her primary care team requesting they contact the patient ?Discussed follow up with SW to address resource needs while patient remains actively involved with  RN Case Manager  to address care management needs ?Collaboration with [name] FNP to advise of interventions and plan to meet resource needs while also advising of patients complaint of Diarrhea x 3 days ?Scheduled follow up call over the next 21days ? ?Patient Goals/Self-Care Activities ?patient will:  ? -  Engage with her primary care provider to address medical needs ?-Review mailed resources ?-Complete Medicaid application and submit to DSS ?-Contact SW as needed prior to next scheduled call ? ?Follow Up Plan: The care management team will reach out to the patient again over the next 21 days.  ? ?  ?  ? ?Follow Up Plan: SW will follow up with patient by phone over the next 21 days. ?     ?[name], BSW, CDP ?  Social Worker, Certified Dementia Practitioner ?[facility] [place] [facility] ?[phone] ? ?   ? ? ? ? ?

## 2021-08-16 NOTE — Patient Instructions (Signed)
Social Worker Visit Information ? ?Goals we discussed today:  ? ?Patient Goals/Self-Care Activities ?patient will:  ? - Engage with her primary care provider to address medical needs ?-Review mailed resources ?-Complete Medicaid application and submit to DSS ?-Contact SW as needed prior to next scheduled call ? ?Materials Provided: Yes: mailed resources to the patient ? ?Patient verbalizes understanding of instructions and care plan provided today and agrees to view in Advance. Active MyChart status confirmed with patient.   ? ?Follow Up Plan: SW will follow up with patient by phone over the next 21 days ? ?[name], BSW, CDP ?Social Worker, Certified Dementia Practitioner ?[facility] [place] [facility] ?[phone] ? ?   ? ?

## 2021-08-17 ENCOUNTER — Telehealth: Payer: Self-pay

## 2021-08-17 NOTE — Chronic Care Management (AMB) (Signed)
? ? ?Chronic Care Management ?Pharmacy Assistant  ? ?Name: Joanna Sandoval  MRN: [id number] DOB: 1945-10-18 ? ? ?Reason for Encounter: Disease State/ General ? ?Recent office visits:  ?08-16-2021 [name] (CCM) ? ?08-11-2021 Little, Claudette Stapler, RN (CCM) ? ?08-09-2021 [name], FNP. WBC= 12.7, RDW= 18.2. Sodium= 145, Calcium= 10.4, Alkaline= 130. A1C= 7.5. Abnormal UA. EKG completed. DECREASE rybelsus 14 mg daily TO 7 mg daily. ? ?Recent consult visits:  ?None ? ?Hospital visits:  ?None in previous 6 months ? ?Medications: ?Outpatient Encounter Medications as of 08/17/2021  ?Medication Sig  ? Accu-Chek Softclix Lancets lancets USE AS DIRECTED  ? albuterol (PROVENTIL HFA;VENTOLIN HFA) 108 (90 Base) MCG/ACT inhaler Inhale 2 puffs into the lungs every 6 (six) hours as needed.  ? atenolol (TENORMIN) 100 MG tablet Take 50 mg by mouth daily.   ? atorvastatin (LIPITOR) 20 MG tablet Take 1 tablet (20 mg total) by mouth daily.  ? Blood Glucose Monitoring Suppl (ACCU-CHEK GUIDE) w/Device KIT USE TO CHECK BLOOD SUGAR  TWICE DAILY AT 10AM AND 5PM  ? carboxymethylcellulose (REFRESH PLUS) 0.5 % SOLN 1 drop 3 (three) times daily as needed.  ? D3-50 50000 units capsule   ? diclofenac sodium (VOLTAREN) 1 % GEL Apply 2 g topically 4 (four) times daily.  ? glimepiride (AMARYL) 2 MG tablet TAKE 1 TABLET BY MOUTH IN  THE MORNING AND AT BEDTIME  ? glucose blood (ACCU-CHEK GUIDE) test strip Use as instructed  ? [name] KIT Use to test blood sugar twice daily as directed. E11.65  ? latanoprost (XALATAN) 0.005 % ophthalmic solution 1 drop at bedtime.  ? losartan (COZAAR) 50 MG tablet Take 50 mg by mouth daily.  ? Magnesium 200 MG TABS Take 1 tab by mouth daily with evening meal  ? naproxen (NAPROSYN) 500 MG tablet Take 1 tablet (500 mg total) by mouth 2 (two) times daily.  ? ondansetron (ZOFRAN ODT) 4 MG disintegrating tablet Take 1 tablet (4 mg total) by mouth every 8 (eight) hours as needed for nausea  or vomiting.  ? RYBELSUS 7 MG TABS Take 1 tablet by mouth daily.  ? ?No facility-administered encounter medications on file as of 08/17/2021.  ? ?Contacted [name] for General Review Call ? ? ?Chart Review: ? ?Have there been any documented new, changed, or discontinued medications since last visit? Yes (08-09-2021 rybelsus 14 mg daily decreased to 7 mg daily) ?Has there been any documented recent hospitalizations or ED visits since last visit with Clinical Pharmacist? No ?Brief Summary: None ? ? ?Adherence Review: ? ?Does the Clinical Pharmacist Assistant have access to adherence rates? No ?Adherence rates for STAR metric medications: None ?Adherence rates for medications indicated for disease state being reviewed: None ?Does the patient have >5 day gap between last estimated fill dates for any of the above medications or other medication gaps? No ?Reason for medication gaps: None ? ? ?Disease State Questions: ? ?Able to connect with Patient? No ? ?Did patient have any problems with their health recently? No ? ?Have you had any admissions or emergency room visits or worsening of your condition(s) since last visit? No ? ?Have you had any visits with new specialists or providers since your last visit? No ? ?Have you had any new health care problem(s) since your last visit? No. Patient stated she had some diarrhea the past few days. Patient spoke with someone form [facility] and was recommended to each some starch. Patient ate rice and is feeling better.  ? ?  Have you run out of any of your medications since you last spoke with clinical pharmacist? No ? ?Are there any medications you are not taking as prescribed? No ? ?Are you having any issues or side effects with your medications? No ? ?Do you have any other health concerns or questions you want to discuss with your Clinical Pharmacist before your next visit? No ? ?Are there any health concerns that you feel we can do a better job addressing? No ? ?Are you having any  problems with any of the following since the last visit: (select all that apply) ? None ?  ?12. Any falls since last visit? No ?  ?13. Any increased or uncontrolled pain since last visit? No ?  ?14. Next visit Type: office ?      Visit with: [name], LPN ?       Date: 08-31-2021 ?       Time: 10:00 ? ?15. Additional Details? Yes. Patient stated she is a little depressed because her rent is increasing next month. She is working on getting assistance. ? ?Care Gaps: ?Covid booster overdue ?AWV 08-31-2021 ? ?Star Rating Drugs: ?Atorvastatin 40 mg- Last filled 08-02-2021 90 [facility] ?Rybelsus 7 mg- Last filled 07-07-2021 90 [facility] ?Losartan 50 mg- Last filled 05-14-2021 90 [facility] ?Glimepiride 2 mg- Last filled 08-02-2021 90 [facility] ? ?Malecca Hicks CMA ?Clinical Pharmacist Assistant ?[phone] ? ?

## 2021-08-30 ENCOUNTER — Other Ambulatory Visit: Payer: Self-pay

## 2021-08-30 MED ORDER — DAPAGLIFLOZIN PROPANEDIOL 10 MG PO TABS: 10.0000 mg | ORAL_TABLET | Freq: Every day | ORAL | 1 refills | Status: DC

## 2021-08-31 ENCOUNTER — Ambulatory Visit: Payer: Medicare Other

## 2021-08-31 ENCOUNTER — Ambulatory Visit: Payer: Medicare Other | Admitting: Nurse Practitioner

## 2021-09-05 ENCOUNTER — Ambulatory Visit: Payer: Medicare Other

## 2021-09-05 ENCOUNTER — Telehealth: Payer: Self-pay

## 2021-09-05 DIAGNOSIS — I700 Atherosclerosis of aorta: Secondary | ICD-10-CM

## 2021-09-05 DIAGNOSIS — I10 Essential (primary) hypertension: Secondary | ICD-10-CM

## 2021-09-05 DIAGNOSIS — E119 Type 2 diabetes mellitus without complications: Secondary | ICD-10-CM

## 2021-09-05 NOTE — Telephone Encounter (Signed)
  Care Management   Follow Up Note   09/05/2021 Name: Joanna Sandoval MRN: [id number] DOB: 1945/08/14   Referred by: [name], [place] Reason for referral : Chronic Care Management (Unsuccessful Call)   An unsuccessful telephone outreach was attempted today. The patient was referred to the case management team for assistance with care management and care coordination.   Follow Up Plan: The care management team will reach out to the patient again over the next 21 days.   [name], BSW, CDP Social Worker, Certified Dementia Practitioner [place] [facility] [place] [facility] [phone]

## 2021-09-05 NOTE — Chronic Care Management (AMB) (Signed)
Chronic Care Management    Social Work Note  09/05/2021 Name: Joanna Sandoval MRN: [id number] DOB: 1945-07-07  Kataleena Holsapple is a 76 y.o. year old female who is a primary care patient of [name], [place]. The CCM team was consulted to assist the patient with chronic disease management and/or care coordination needs related to:  DM II, HTN, Aortic Atherosclerosis .   Engaged with patient by telephone for follow up visit in response to provider referral for social work chronic care management and care coordination services.   Consent to Services:  The patient was given information about Chronic Care Management services, agreed to services, and gave verbal consent prior to initiation of services.  Please see initial visit note for detailed documentation.   Patient agreed to services and consent obtained.   Assessment: Review of patient past medical history, allergies, medications, and health status, including review of relevant consultants reports was performed today as part of a comprehensive evaluation and provision of chronic care management and care coordination services.     SDOH (Social Determinants of Health) assessments and interventions performed:    Advanced Directives Status: Not addressed in this encounter.  CCM Care Plan  No Known Allergies  Outpatient Encounter Medications as of 09/05/2021  Medication Sig   Accu-Chek Softclix Lancets lancets USE AS DIRECTED   albuterol (PROVENTIL HFA;VENTOLIN HFA) 108 (90 Base) MCG/ACT inhaler Inhale 2 puffs into the lungs every 6 (six) hours as needed.   atenolol (TENORMIN) 100 MG tablet Take 50 mg by mouth daily.    atorvastatin (LIPITOR) 20 MG tablet Take 1 tablet (20 mg total) by mouth daily.   Blood Glucose Monitoring Suppl (ACCU-CHEK GUIDE) w/Device KIT USE TO CHECK BLOOD SUGAR  TWICE DAILY AT 10AM AND 5PM   carboxymethylcellulose (REFRESH PLUS) 0.5 % SOLN 1 drop 3 (three) times daily as needed.   D3-50 50000 units capsule     dapagliflozin propanediol (FARXIGA) 10 MG TABS tablet Take 1 tablet (10 mg total) by mouth daily before breakfast.   diclofenac sodium (VOLTAREN) 1 % GEL Apply 2 g topically 4 (four) times daily.   glimepiride (AMARYL) 2 MG tablet TAKE 1 TABLET BY MOUTH IN  THE MORNING AND AT BEDTIME   glucose blood (ACCU-CHEK GUIDE) test strip Use as instructed   [name] KIT Use to test blood sugar twice daily as directed. E11.65   latanoprost (XALATAN) 0.005 % ophthalmic solution 1 drop at bedtime.   losartan (COZAAR) 50 MG tablet Take 50 mg by mouth daily.   Magnesium 200 MG TABS Take 1 tab by mouth daily with evening meal   naproxen (NAPROSYN) 500 MG tablet Take 1 tablet (500 mg total) by mouth 2 (two) times daily.   ondansetron (ZOFRAN ODT) 4 MG disintegrating tablet Take 1 tablet (4 mg total) by mouth every 8 (eight) hours as needed for nausea or vomiting.   RYBELSUS 7 MG TABS Take 1 tablet by mouth daily.   No facility-administered encounter medications on file as of 09/05/2021.    Patient Active Problem List   Diagnosis Date Noted   Colon cancer screening 05/05/2020   Morbid obesity ([place]) 05/05/2020   Type 2 diabetes mellitus without complication, without long-term current use of insulin ([place]) 07/31/2018   Essential hypertension 04/30/2018   Fibroid 11/11/2011    Conditions to be addressed/monitored: HTN, DMII, and Aortic Atherosclerosis ; Financial constraints related to costs of living  Care Plan : Social Work Plan of Care  Updates made by  [name] since 09/05/2021 12:00 AM     Problem: Quality of Life (General Plan of Care)      Goal: Quality of Life Maintained   Start Date: 08/16/2021  This Visit's Progress: On track  Priority: High  Note:   Current Barriers:  Chronic disease management support and education needs related to HTN, DM, and Aortic Atherosclerosis   Financial constraints related to costs of food and dental care  Social Worker  Clinical Goal(s):  patient will work with SW to identify and address any acute and/or chronic care coordination needs related to the self health management of HTN, DM, and Aortic Atherosclerosis   explore community resource options for unmet needs related to:  Museum/gallery curator Strain  SW Interventions:  Inter-disciplinary care team collaboration (see longitudinal plan of care) Collaboration with [name], [place] regarding development and update of comprehensive plan of care as evidenced by provider attestation and co-signature Telephonic visit completed with the patient to assess goal progression Confirmed patient did receive mailed resources and is in process of completing Medicaid application Reviewed with the patient once the application is completed she is to submit directly to DSS; advised the patient if she needs assistance with completion she can make an appointment at her local DSS for assistance with completion - patient stated understanding Determined the patient has recently been prescribed a new medication called Wilder Glade - patient indicates she is unsure if she is to continue Rybelsus and if these medications can be taken at the same times since both indicate they are to be taken before breakfast Advised the patient SW would reach out to her provider for guidance on patients medication related question Patient also indicates she feels the skin on her neck is lose and she may be losing weight - patient reports she is concerned she may have throat cancer due to these changes Discussed the patient called a 24 hour nurse line regarding above concerns and was advised she may be dehydrated. Patient reports she is trying to increase water intake Advised the patient SW would advise her primary care provider of concern with her throat Collaboration with [name] FNP to advise of patients concern with throat changes and medication related questions Received feedback the patient is to take both  Rybelsus and Wilder Glade but should space the medication out by at least 2 hours. Mrs. Laurance Flatten indicated the patient can take [place] later in the day and should remain taking Rybelsus before breakfast Successful outbound call placed to the patient to advise if medication directions from her primary care provider, patient stated understanding and will take both medications and space medications out by at least 2 hours Advised the patient to bring her medications to the office this week for an upcomming appointment if needed to review medication with her primary care team Collaboration with RN Care Manager to advise of interventions and plan  Patient Goals/Self-Care Activities patient will:   -  Engage with her primary care provider to address medication questions -Complete Medicaid application and submit to [place] as needed prior to next scheduled call  Follow Up Plan: The care management team will reach out to the patient again over the next 45 days.        Follow Up Plan: SW will follow up with patient by phone over the next two months.      [name], BSW, CDP Social Worker, Certified Dementia Practitioner [place] [facility] [phone]

## 2021-09-05 NOTE — Patient Instructions (Signed)
Social Worker Visit Information  Goals we discussed today:  Patient Goals/Self-Care Activities patient will:   - Engage with her primary care provider to address medication questions -Complete Medicaid application and submit to [place] as needed prior to next scheduled call  Materials Provided: Verbal education about medication regimen provided by phone  Patient verbalizes understanding of instructions and care plan provided today and agrees to view in [place]. Active MyChart status and patient understanding of how to access instructions and care plan via MyChart confirmed with patient.     Follow Up Plan: SW will follow up with patient by phone over the next two months   [name], BSW, CDP Social Worker, Certified Dementia Practitioner [place] [facility] [phone]

## 2021-09-08 ENCOUNTER — Ambulatory Visit (INDEPENDENT_AMBULATORY_CARE_PROVIDER_SITE_OTHER): Payer: Medicare Other

## 2021-09-08 ENCOUNTER — Telehealth: Payer: Self-pay

## 2021-09-08 VITALS — BP 124/62 | HR 94 | Temp 98.3°F | Ht 61.0 in | Wt 204.6 lb

## 2021-09-08 DIAGNOSIS — Z0000 Encounter for general adult medical examination without abnormal findings: Secondary | ICD-10-CM | POA: Diagnosis not present

## 2021-09-08 NOTE — Progress Notes (Signed)
Subjective:   Jolynn Bajorek is a 76 y.o. female who presents for Medicare Annual (Subsequent) preventive examination.  Review of Systems     Cardiac Risk Factors include: advanced age (>44mn, >>75women);diabetes mellitus;hypertension;obesity (BMI >30kg/m2)     Objective:    Today's Vitals   09/08/21 0810  BP: 124/62  Pulse: 94  Temp: 98.3 F (36.8 C)  TempSrc: Oral  SpO2: 94%  Weight: 204 lb 9.6 oz (92.8 kg)  Height: _0  (1.549 m)   Body mass index is 38.66 kg/m.     09/08/2021    8:25 AM 08/18/2020    9:22 AM 10/17/2019    8:26 AM 07/30/2019    8:57 AM 06/19/2018    8:58 AM 05/02/2017   11:29 AM  Advanced Directives  Does Patient Have a Medical Advance Directive? _1  No  Would patient like information on creating a medical advance directive? No - Patient declined  No - Patient declined No - Patient declined Yes (MAU/Ambulatory/Procedural Areas - Information given) Yes (MAU/Ambulatory/Procedural Areas - Information given)    Current Medications (verified) Outpatient Encounter Medications as of 09/08/2021  Medication Sig   Accu-Chek Softclix Lancets lancets USE AS DIRECTED   albuterol (PROVENTIL HFA;VENTOLIN HFA) 108 (90 Base) MCG/ACT inhaler Inhale 2 puffs into the lungs every 6 (six) hours as needed.   atenolol (TENORMIN) 100 MG tablet Take 50 mg by mouth daily.    atorvastatin (LIPITOR) 20 MG tablet Take 1 tablet (20 mg total) by mouth daily.   Blood Glucose Monitoring Suppl (ACCU-CHEK GUIDE) w/Device KIT USE TO CHECK BLOOD SUGAR  TWICE DAILY AT 10AM AND 5PM   carboxymethylcellulose (REFRESH PLUS) 0.5 % SOLN 1 drop 3 (three) times daily as needed.   D3-50 50000 units capsule    dapagliflozin propanediol (FARXIGA) 10 MG TABS tablet Take 1 tablet (10 mg total) by mouth daily before breakfast.   diclofenac sodium (VOLTAREN) 1 % GEL Apply 2 g topically 4 (four) times daily.   glimepiride (AMARYL) 2 MG tablet TAKE 1 TABLET BY MOUTH IN  THE MORNING AND AT  BEDTIME   glucose blood (ACCU-CHEK GUIDE) test strip Use as instructed   [name] KIT Use to test blood sugar twice daily as directed. E11.65   latanoprost (XALATAN) 0.005 % ophthalmic solution 1 drop at bedtime.   losartan (COZAAR) 50 MG tablet Take 50 mg by mouth daily.   Magnesium 200 MG TABS Take 1 tab by mouth daily with evening meal   ondansetron (ZOFRAN ODT) 4 MG disintegrating tablet Take 1 tablet (4 mg total) by mouth every 8 (eight) hours as needed for nausea or vomiting.   RYBELSUS 7 MG TABS Take 1 tablet by mouth daily.   naproxen (NAPROSYN) 500 MG tablet Take 1 tablet (500 mg total) by mouth 2 (two) times daily. (Patient not taking: Reported on 09/08/2021)   No facility-administered encounter medications on file as of 09/08/2021.    Allergies (verified) Patient has no known allergies.   History: Past Medical History:  Diagnosis Date   Arthritis    Diabetes mellitus    Hypercholesteremia    Hypertension    Post-menopausal bleeding    Postmenopausal bleeding 08/30/2011   Normal EBX 09/2011.   Past Surgical History:  Procedure Laterality Date   CATARACT EXTRACTION     DILATION AND CURETTAGE OF UTERUS     HYSTEROSCOPY WITH D & C N/A 05/02/2017   Procedure: DILATATION AND CURETTAGE /HYSTEROSCOPY;  Surgeon:  Constant, Peggy, MD;  Location: [facility];  Service: Gynecology;  Laterality: N/A;   KNEE ARTHROSCOPY     Family History  Problem Relation Age of Onset   Hypertension Mother    Hypertension Father    Cancer Father        ?lung   Diabetes Sister    Social History   Socioeconomic History   Marital status: Divorced    Spouse name: Not on file   Number of children: Not on file   Years of education: Not on file   Highest education level: Not on file  Occupational History   Not on file  Tobacco Use   Smoking status: Former    Types: Cigarettes   Smokeless tobacco: Never  Vaping Use   Vaping Use: Never used   Substance and Sexual Activity   Alcohol use: No   Drug use: No   Sexual activity: Not Currently    Birth control/protection: Post-menopausal  Other Topics Concern   Not on file  Social History Narrative   Not on file   Social Determinants of Health   Financial Resource Strain: Medium Risk   Difficulty of Paying Living Expenses: Somewhat hard  Food Insecurity: No Food Insecurity   Worried About Charity fundraiser in the Last Year: Never true   Ran Out of Food in the Last Year: Never true  Transportation Needs: No Transportation Needs   Lack of Transportation (Medical): No   Lack of Transportation (Non-Medical): No  Physical Activity: Sufficiently Active   Days of Exercise per Week: 7 days   Minutes of Exercise per Session: 60 min  Stress: No Stress Concern Present   Feeling of Stress : Not at all  Social Connections: Moderately Isolated   Frequency of Communication with Friends and Family: More than three times a week   Frequency of Social Gatherings with Friends and Family: Twice a week   Attends Religious Services: Never   Marine scientist or Organizations: Yes   Attends Archivist Meetings: Never   Marital Status: Divorced    Tobacco Counseling Counseling given: Not Answered   Clinical Intake:  Pre-visit preparation completed: Yes  Pain : No/denies pain     Nutritional Status: BMI > 30  Obese Nutritional Risks: None Diabetes: Yes  How often do you need to have someone help you when you read instructions, pamphlets, or other written materials from your doctor or pharmacy?: 1 - Never What is the last grade level you completed in school?: 11th grade  Diabetic? Yes Nutrition Risk Assessment:  Has the patient had any N/V/D within the last 2 months?  No  Does the patient have any non-healing wounds?  No  Has the patient had any unintentional weight loss or weight gain?  No   Diabetes:  Is the patient diabetic?  Yes  If diabetic, was a CBG  obtained today?  No  Did the patient bring in their glucometer from home?  No  How often do you monitor your CBG's? Twice daily.   Financial Strains and Diabetes Management:  Are you having any financial strains with the device, your supplies or your medication? No .  Does the patient want to be seen by Chronic Care Management for management of their diabetes?  No  Would the patient like to be referred to a Nutritionist or for Diabetic Management?  No   Diabetic Exams:  Diabetic Eye Exam: Completed 10/12/2020 Diabetic Foot Exam: Completed 08/09/2021  Interpreter Needed?: No  Information entered by :: NAllen LPN   Activities of Daily Living    09/08/2021    8:27 AM  In your present state of health, do you have any difficulty performing the following activities:  Hearing? 1  Vision? 1  Difficulty concentrating or making decisions? 1  Walking or climbing stairs? 1  Dressing or bathing? 0  Doing errands, shopping? 1  Comment always has someone when doing errands  Preparing Food and eating ? N  Using the Toilet? N  In the past six months, have you accidently leaked urine? Y  Do you have problems with loss of bowel control? N  Managing your Medications? N  Managing your Finances? N  Housekeeping or managing your Housekeeping? N    Patient Care Team: [name], FNP as PCP - General (General Practice) Rex Kras, Claudette Stapler, RN as Case Manager [place], Sharyn Blitz, [place] (Pharmacist) [name] as [place] any recent Medical Services you may have received from other than [facility] providers in the past year (date may be approximate).     Assessment:   This is a routine wellness examination for [place].  Hearing/Vision screen Vision Screening - Comments:: Regular eye exams, [facility]  Dietary issues and exercise activities discussed: Current Exercise Habits: Home exercise routine, Type of exercise: walking, Time (Minutes): 60,  Frequency (Times/Week): 7, Weekly Exercise (Minutes/Week): 420   Goals Addressed             This Visit's Progress    Patient Stated       09/08/2021, lose more weight and try to be healthy       Depression Screen    09/08/2021    8:27 AM 11/08/2020    8:43 AM 08/18/2020    9:24 AM 07/07/2020    9:34 AM 01/29/2020    8:40 AM 10/28/2019    4:57 PM 10/28/2019    4:56 PM  PHQ 2/9 Scores  PHQ - 2 Score 0 0 0 0 0 0 0  PHQ- 9 Score  0  0 0 0     Fall Risk    09/08/2021    8:26 AM 08/18/2020    9:23 AM 08/04/2020    9:18 AM 07/30/2019    8:59 AM 05/08/2019    9:01 AM  Fall Risk   Falls in the past year? 0 0 0 0 0  Number falls in past yr: 0      Injury with Fall? 0      Risk for fall due to : Medication side effect Medication side effect  Medication side effect;Impaired balance/gait   Follow up Falls evaluation completed;Education provided;Falls prevention discussed Falls evaluation completed;Education provided;Falls prevention discussed  Falls evaluation completed;Education provided;Falls prevention discussed     FALL RISK PREVENTION PERTAINING TO THE HOME:  Any stairs in or around the home? No  If so, are there any without handrails?  N/a Home free of loose throw rugs in walkways, pet beds, electrical cords, etc? Yes  Adequate lighting in your home to reduce risk of falls? Yes   ASSISTIVE DEVICES UTILIZED TO PREVENT FALLS:  Life alert? No  Use of a cane, walker or w/c? Yes  Grab bars in the bathroom? Yes  Shower chair or bench in shower? Yes  Elevated toilet seat or a handicapped toilet? No   TIMED UP AND GO:  Was the test performed? No .    Gait slow and steady with assistive  device  Cognitive Function:        09/08/2021    8:30 AM 08/18/2020    9:28 AM 07/30/2019    9:06 AM 06/19/2018    9:08 AM  6CIT Screen  What Year? 0 points 0 points 0 points 0 points  What month? 0 points 0 points 0 points 0 points  What time? 3 points 3 points 3 points 3 points  Count  back from 20 0 points 4 points 0 points 0 points  Months in reverse 4 points 2 points 4 points 2 points  Repeat phrase 4 points 4 points 4 points 0 points  Total Score 11 points 13 points 11 points 5 points    Immunizations Immunization History  Administered Date(s) Administered   Fluad Quad(high Dose 65+) 01/29/2020, 02/01/2021   Influenza, High Dose Seasonal PF 01/16/2018   Influenza,inj,Quad PF,6+ Mos 01/12/2019   PFIZER(Purple Top)SARS-COV-2 Vaccination 05/25/2019, 06/18/2019, 02/14/2020, 09/18/2020   PNEUMOCOCCAL CONJUGATE-20 08/09/2021   Pneumococcal Polysaccharide-23 08/04/2020   Tdap 10/06/2018   Zoster Recombinat (Shingrix) 02/08/2021, 04/23/2021    TDAP status: Up to date  Flu Vaccine status: Up to date  Pneumococcal vaccine status: Up to date  Covid-19 vaccine status: Completed vaccines  Qualifies for Shingles Vaccine? Yes   Zostavax completed No   Shingrix Completed?: Yes  Screening Tests Health Maintenance  Topic Date Due   COVID-19 Vaccine (5 - Booster for Pfizer series) 11/13/2020   OPHTHALMOLOGY EXAM  10/12/2021   INFLUENZA VACCINE  11/15/2021   HEMOGLOBIN A1C  02/08/2022   MAMMOGRAM  07/30/2022   FOOT EXAM  08/10/2022   COLONOSCOPY (Pts 45-63yr Insurance coverage will need to be confirmed)  02/05/2028   TETANUS/TDAP  10/05/2028   Pneumonia Vaccine 75 Years old  Completed   DEXA SCAN  Completed   Hepatitis C Screening  Completed   Zoster Vaccines- Shingrix  Completed   HPV VACCINES  Aged Out    Health Maintenance  Health Maintenance Due  Topic Date Due   COVID-19 Vaccine (5 - Booster for [place]series) 11/13/2020    Colorectal cancer screening: Type of screening: Colonoscopy. Completed 02/04/2018. Repeat every 10 years  Mammogram status: patient to schedule  Bone Density status: Completed 07/16/2012.   Lung Cancer Screening: (Low Dose CT Chest recommended if Age 75-80years, 30 pack-year currently smoking OR have quit w/in 15years.) does  not qualify.   Lung Cancer Screening Referral: no  Additional Screening:  Hepatitis C Screening: does qualify; Completed 10/30/2018  Vision Screening: Recommended annual ophthalmology exams for early detection of glaucoma and other disorders of the eye. Is the patient up to date with their annual eye exam?  Yes  Who is the provider or what is the name of the office in which the patient attends annual eye exams? [facility] If pt is not established with a provider, would they like to be referred to a provider to establish care? No .   Dental Screening: Recommended annual dental exams for proper oral hygiene  Community Resource Referral / Chronic Care Management: CRR required this visit?  No   CCM required this visit?  No      Plan:     I have personally reviewed and noted the following in the patient's chart:   Medical and social history Use of alcohol, tobacco or illicit drugs  Current medications and supplements including opioid prescriptions.  Functional ability and status Nutritional status Physical activity Advanced directives List of other physicians Hospitalizations, surgeries, and ER visits  in previous 12 months Vitals Screenings to include cognitive, depression, and falls Referrals and appointments  In addition, I have reviewed and discussed with patient certain preventive protocols, quality metrics, and best practice recommendations. A written personalized care plan for preventive services as well as general preventive health recommendations were provided to patient.     [name], LPN   07/01/7423   Nurse Notes: none

## 2021-09-08 NOTE — Addendum Note (Signed)
Addended by: [name] on: 09/08/2021 08:45 AM   Modules accepted: Orders

## 2021-09-08 NOTE — Telephone Encounter (Signed)
   Telephone encounter was:  Successful.  09/08/2021 Name: Jannie Doyle MRN: [id number] DOB: January 30, 1946  Aquita Simmering is a 76 y.o. year old female who is a primary care patient of [name], [place] . The community resource team was consulted for assistance with Food Insecurity and Financial Difficulties related to financial strain  Care guide performed the following interventions: Patient provided with information about care guide support team and interviewed to confirm resource needs.Patient stated rent went up and she only gets $70 food stamps. She is struggling to pay all the bills and wants me to mail resources to her  Follow Up Plan:  Care guide will follow up with patient by phone over the next two weeks    [facility]  [phone] [address], [place], [place] [address] Phone: [phone] Email: [email or web address]

## 2021-09-08 NOTE — Patient Instructions (Signed)
Joanna Sandoval , Thank you for taking time to come for your Medicare Wellness Visit. I appreciate your ongoing commitment to your health goals. Please review the following plan we discussed and let me know if I can assist you in the future.   Screening recommendations/referrals: Colonoscopy: completed 02/04/2018 Mammogram: patient to schedule Bone Density: completed 07/16/2012 Recommended yearly ophthalmology/optometry visit for glaucoma screening and checkup Recommended yearly dental visit for hygiene and checkup  Vaccinations: Influenza vaccine: due 11/15/2021 Pneumococcal vaccine: completed 08/09/2021 Tdap vaccine: completed 10/06/2018, due 10/05/2028 Shingles vaccine: completed    Covid-19:09/18/2020, 02/14/2020, 06/18/2019, 05/25/2019  Advanced directives: Advance directive discussed with you today. Even though you declined this today please call our office should you change your mind and we can give you the proper paperwork for you to fill out.  Conditions/risks identified: none  Next appointment: Follow up in one year for your annual wellness visit    Preventive Care 65 Years and Older, Female Preventive care refers to lifestyle choices and visits with your health care provider that can promote health and wellness. What does preventive care include? A yearly physical exam. This is also called an annual well check. Dental exams once or twice a year. Routine eye exams. Ask your health care provider how often you should have your eyes checked. Personal lifestyle choices, including: Daily care of your teeth and gums. Regular physical activity. Eating a healthy diet. Avoiding tobacco and drug use. Limiting alcohol use. Practicing safe sex. Taking low-dose aspirin every day. Taking vitamin and mineral supplements as recommended by your health care provider. What happens during an annual well check? The services and screenings done by your health care provider during your annual well check  will depend on your age, overall health, lifestyle risk factors, and family history of disease. Counseling  Your health care provider may ask you questions about your: Alcohol use. Tobacco use. Drug use. Emotional well-being. Home and relationship well-being. Sexual activity. Eating habits. History of falls. Memory and ability to understand (cognition). Work and work Statistician. Reproductive health. Screening  You may have the following tests or measurements: Height, weight, and BMI. Blood pressure. Lipid and cholesterol levels. These may be checked every 5 years, or more frequently if you are over 46 years old. Skin check. Lung cancer screening. You may have this screening every year starting at age 51 if you have a 30-pack-year history of smoking and currently smoke or have quit within the past 15 years. Fecal occult blood test (FOBT) of the stool. You may have this test every year starting at age 36. Flexible sigmoidoscopy or colonoscopy. You may have a sigmoidoscopy every 5 years or a colonoscopy every 10 years starting at age 80. Hepatitis C blood test. Hepatitis B blood test. Sexually transmitted disease (STD) testing. Diabetes screening. This is done by checking your blood sugar (glucose) after you have not eaten for a while (fasting). You may have this done every 1-3 years. Bone density scan. This is done to screen for osteoporosis. You may have this done starting at age 30. Mammogram. This may be done every 1-2 years. Talk to your health care provider about how often you should have regular mammograms. Talk with your health care provider about your test results, treatment options, and if necessary, the need for more tests. Vaccines  Your health care provider may recommend certain vaccines, such as: Influenza vaccine. This is recommended every year. Tetanus, diphtheria, and acellular pertussis (Tdap, Td) vaccine. You may need a Td booster every  10 years. Zoster vaccine. You  may need this after age 68. Pneumococcal 13-valent conjugate (PCV13) vaccine. One dose is recommended after age 10. Pneumococcal polysaccharide (PPSV23) vaccine. One dose is recommended after age 59. Talk to your health care provider about which screenings and vaccines you need and how often you need them. This information is not intended to replace advice given to you by your health care provider. Make sure you discuss any questions you have with your health care provider. Document Released: 04/30/2015 Document Revised: 12/22/2015 Document Reviewed: 02/02/2015 Elsevier Interactive Patient Education  2017 [place] Prevention in the Home Falls can cause injuries. They can happen to people of all ages. There are many things you can do to make your home safe and to help prevent falls. What can I do on the outside of my home? Regularly fix the edges of walkways and driveways and fix any cracks. Remove anything that might make you trip as you walk through a door, such as a raised step or threshold. Trim any bushes or trees on the path to your home. Use bright outdoor lighting. Clear any walking paths of anything that might make someone trip, such as rocks or tools. Regularly check to see if handrails are loose or broken. Make sure that both sides of any steps have handrails. Any raised decks and porches should have guardrails on the edges. Have any leaves, snow, or ice cleared regularly. Use sand or salt on walking paths during winter. Clean up any spills in your garage right away. This includes oil or grease spills. What can I do in the bathroom? Use night lights. Install grab bars by the toilet and in the tub and shower. Do not use towel bars as grab bars. Use non-skid mats or decals in the tub or shower. If you need to sit down in the shower, use a plastic, non-slip stool. Keep the floor dry. Clean up any water that spills on the floor as soon as it happens. Remove soap buildup  in the tub or shower regularly. Attach bath mats securely with double-sided non-slip rug tape. Do not have throw rugs and other things on the floor that can make you trip. What can I do in the bedroom? Use night lights. Make sure that you have a light by your bed that is easy to reach. Do not use any sheets or blankets that are too big for your bed. They should not hang down onto the floor. Have a firm chair that has side arms. You can use this for support while you get dressed. Do not have throw rugs and other things on the floor that can make you trip. What can I do in the kitchen? Clean up any spills right away. Avoid walking on wet floors. Keep items that you use a lot in easy-to-reach places. If you need to reach something above you, use a strong step stool that has a grab bar. Keep electrical cords out of the way. Do not use floor polish or wax that makes floors slippery. If you must use wax, use non-skid floor wax. Do not have throw rugs and other things on the floor that can make you trip. What can I do with my stairs? Do not leave any items on the stairs. Make sure that there are handrails on both sides of the stairs and use them. Fix handrails that are broken or loose. Make sure that handrails are as long as the stairways. Check any carpeting to make  sure that it is firmly attached to the stairs. Fix any carpet that is loose or worn. Avoid having throw rugs at the top or bottom of the stairs. If you do have throw rugs, attach them to the floor with carpet tape. Make sure that you have a light switch at the top of the stairs and the bottom of the stairs. If you do not have them, ask someone to add them for you. What else can I do to help prevent falls? Wear shoes that: Do not have high heels. Have rubber bottoms. Are comfortable and fit you well. Are closed at the toe. Do not wear sandals. If you use a stepladder: Make sure that it is fully opened. Do not climb a closed  stepladder. Make sure that both sides of the stepladder are locked into place. Ask someone to hold it for you, if possible. Clearly mark and make sure that you can see: Any grab bars or handrails. First and last steps. Where the edge of each step is. Use tools that help you move around (mobility aids) if they are needed. These include: Canes. Walkers. Scooters. Crutches. Turn on the lights when you go into a dark area. Replace any light bulbs as soon as they burn out. Set up your furniture so you have a clear path. Avoid moving your furniture around. If any of your floors are uneven, fix them. If there are any pets around you, be aware of where they are. Review your medicines with your doctor. Some medicines can make you feel dizzy. This can increase your chance of falling. Ask your doctor what other things that you can do to help prevent falls. This information is not intended to replace advice given to you by your health care provider. Make sure you discuss any questions you have with your health care provider. Document Released: 01/28/2009 Document Revised: 09/09/2015 Document Reviewed: 05/08/2014 Elsevier Interactive Patient Education  2017 [employer].

## 2021-09-14 DIAGNOSIS — Z7984 Long term (current) use of oral hypoglycemic drugs: Secondary | ICD-10-CM

## 2021-09-14 DIAGNOSIS — I10 Essential (primary) hypertension: Secondary | ICD-10-CM

## 2021-09-14 DIAGNOSIS — E1169 Type 2 diabetes mellitus with other specified complication: Secondary | ICD-10-CM

## 2021-09-20 ENCOUNTER — Telehealth: Payer: Self-pay

## 2021-09-20 NOTE — Chronic Care Management (AMB) (Signed)
Chronic Care Management Pharmacy Assistant   Name: Joanna Sandoval  MRN: [id number] DOB: 1945/09/10  Reason for Encounter: Disease State/ Diabetes  Recent office visits:  09-08-2021 Joanna Simmering, LPN. Medicare annual wellness visit.  09-05-2021 Joanna Sandoval (CCM)  Recent consult visits:  None  Hospital visits:  None in previous 6 months  Medications: Outpatient Encounter Medications as of 09/20/2021  Medication Sig   Accu-Chek Softclix Lancets lancets USE AS DIRECTED   albuterol (PROVENTIL HFA;VENTOLIN HFA) 108 (90 Base) MCG/ACT inhaler Inhale 2 puffs into the lungs every 6 (six) hours as needed.   atenolol (TENORMIN) 100 MG tablet Take 50 mg by mouth daily.    atorvastatin (LIPITOR) 20 MG tablet Take 1 tablet (20 mg total) by mouth daily.   Blood Glucose Monitoring Suppl (ACCU-CHEK GUIDE) w/Device KIT USE TO CHECK BLOOD SUGAR  TWICE DAILY AT 10AM AND 5PM   carboxymethylcellulose (REFRESH PLUS) 0.5 % SOLN 1 drop 3 (three) times daily as needed.   D3-50 50000 units capsule    dapagliflozin propanediol (FARXIGA) 10 MG TABS tablet Take 1 tablet (10 mg total) by mouth daily before breakfast.   diclofenac sodium (VOLTAREN) 1 % GEL Apply 2 g topically 4 (four) times daily.   glimepiride (AMARYL) 2 MG tablet TAKE 1 TABLET BY MOUTH IN  THE MORNING AND AT BEDTIME   glucose blood (ACCU-CHEK GUIDE) test strip Use as instructed   [name] KIT Use to test blood sugar twice daily as directed. E11.65   latanoprost (XALATAN) 0.005 % ophthalmic solution 1 drop at bedtime.   losartan (COZAAR) 50 MG tablet Take 50 mg by mouth daily.   Magnesium 200 MG TABS Take 1 tab by mouth daily with evening meal   naproxen (NAPROSYN) 500 MG tablet Take 1 tablet (500 mg total) by mouth 2 (two) times daily. (Patient not taking: Reported on 09/08/2021)   ondansetron (ZOFRAN ODT) 4 MG disintegrating tablet Take 1 tablet (4 mg total) by mouth every 8 (eight) hours as needed  for nausea or vomiting.   RYBELSUS 7 MG TABS Take 1 tablet by mouth daily.   No facility-administered encounter medications on file as of 09/20/2021.  Recent Relevant Labs: Lab Results  Component Value Date/Time   HGBA1C 7.5 (H) 08/09/2021 10:18 AM   HGBA1C 7.6 (H) 05/23/2021 09:49 AM   MICROALBUR 30 08/04/2020 09:46 AM   MICROALBUR 30 07/30/2019 10:58 AM    Kidney Function Lab Results  Component Value Date/Time   CREATININE 0.94 08/09/2021 10:18 AM   CREATININE 1.06 (H) 05/23/2021 09:49 AM   GFRNONAA >60 11/29/2020 03:33 AM   GFRAA 70 01/29/2020 09:35 AM    Current antihyperglycemic regimen:  Glimepiride 2 mg daily Rybelsus 7 mg daily Farxiga 10 mg daily  What recent interventions/DTPs have been made to improve glycemic control:  Educated on A1c and blood sugar goals; Exercise goal of 150 minutes per week; Benefits of routine self-monitoring of blood sugar; -Counseled to check feet daily and get yearly eye exams -Counseled on diet and exercise extensively Recommended to continue current medication  Have there been any recent hospitalizations or ED visits since last visit with CPP? No  Patient denies hypoglycemic symptoms  Patient denies hyperglycemic symptoms  How often are you checking your blood sugar? once daily  What are your blood sugars ranging?  Fasting: 140, 120, 118 Before meals: None After meals: None Bedtime: None  During the week, how often does your blood glucose drop below 70? Never  Are you checking your feet daily/regularly? Daily  Adherence Review: Is the patient currently on a STATIN medication? Yes Is the patient currently on ACE/ARB medication? Yes Does the patient have >5 day gap between last estimated fill dates? No  NOTES: Patient stated she just started farxiga and medication was sent into [facility]. Patient wants to clarify if she will be on medication long term and if so needs rx sent to [facility].   Care Gaps: Covid booster  overdue  Star Rating Drugs: Atorvastatin 40 mg- Last filled 08-02-2021 90 [facility] Rybelsus 7 mg- Last filled 07-07-2021 90 DS [facility] Losartan 50 mg- Last filled 08-02-2021 90 [facility] Glimepiride 2 mg- Last filled 07-28-2021 90 [facility] Farxiga 10 mg- Last filled 08-30-2021 30 DS [facility] Clinical Pharmacist Assistant [phone]

## 2021-09-21 ENCOUNTER — Other Ambulatory Visit: Payer: Self-pay | Admitting: Nurse Practitioner

## 2021-09-21 MED ORDER — DAPAGLIFLOZIN PROPANEDIOL 10 MG PO TABS: 10.0000 mg | ORAL_TABLET | Freq: Every day | ORAL | 1 refills | Status: DC

## 2021-09-25 ENCOUNTER — Ambulatory Visit (HOSPITAL_COMMUNITY)
Admission: EM | Admit: 2021-09-25 | Discharge: 2021-09-25 | Disposition: A | Discharge status: Home / Self Care | Payer: Medicare Other | Attending: Emergency Medicine | Admitting: Emergency Medicine

## 2021-09-25 DIAGNOSIS — M25532 Pain in left wrist: Secondary | ICD-10-CM

## 2021-09-25 MED ORDER — METHYLPREDNISOLONE SODIUM SUCC 125 MG IJ SOLR
60.0000 mg | Freq: Once | INTRAMUSCULAR | Status: AC
  Administered 2021-09-25: 60 mg via INTRAMUSCULAR

## 2021-09-25 MED ORDER — NAPROXEN 375 MG PO TABS: 375.0000 mg | ORAL_TABLET | Freq: Two times a day (BID) | ORAL | 0 refills | Status: DC

## 2021-09-25 MED ORDER — METHYLPREDNISOLONE SODIUM SUCC 125 MG IJ SOLR
INTRAMUSCULAR | Status: AC
  Filled 2021-09-25: qty 2

## 2021-09-25 NOTE — ED Triage Notes (Addendum)
Patient presents to Urgent Care with complaints of L wrist pain since Friday. Patient reports pain.swelling and tenderness at site, as well as pain with pressure  Pt reports she does not think she injured it.  Pt reports motrin, elevation and warm compress for pain and swelling.

## 2021-09-25 NOTE — ED Provider Notes (Signed)
[facility]    CSN: [id number] Arrival date & time: 09/25/21  1047      History   Chief Complaint Chief Complaint  Patient presents with   Wrist Pain    HPI Joanna Sandoval is a 76 y.o. female.   Patient presents with pain and swelling to the left wrist for 2 days.  Denies precipitating event, injury or trauma.  Motion is intact but elicits discomfort.  Erythema is extending up the forearm .endorses that she wrapped her wrist in a towel for compression , heat and Motrin to the affected area and swelling improved.  Denies numbness or tingling.   Past Medical History:  Diagnosis Date   Arthritis    Diabetes mellitus    Hypercholesteremia    Hypertension    Post-menopausal bleeding    Postmenopausal bleeding 08/30/2011   Normal EBX 09/2011.    Patient Active Problem List   Diagnosis Date Noted   Colon cancer screening 05/05/2020   Morbid obesity ([place]) 05/05/2020   Type 2 diabetes mellitus without complication, without long-term current use of insulin ([place]) 07/31/2018   Essential hypertension 04/30/2018   Fibroid 11/11/2011    Past Surgical History:  Procedure Laterality Date   CATARACT EXTRACTION     DILATION AND CURETTAGE OF UTERUS     HYSTEROSCOPY WITH D & C N/A 05/02/2017   Procedure: DILATATION AND CURETTAGE /HYSTEROSCOPY;  Surgeon: Mora Bellman, MD;  Location: [facility];  Service: Gynecology;  Laterality: N/A;   KNEE ARTHROSCOPY      OB History     Gravida  4   Para  3   Term  3   Preterm      AB  1   Living  1      SAB  1   IAB      Ectopic      Multiple      Live Births               Home Medications    Prior to Admission medications   Medication Sig Start Date End Date Taking? Authorizing Provider  Accu-Chek Softclix Lancets lancets USE AS DIRECTED 11/15/20   [name], FNP  albuterol (PROVENTIL HFA;VENTOLIN HFA) 108 (90 Base) MCG/ACT inhaler Inhale 2 puffs into the lungs every 6 (six) hours as  needed.    [provider]  atenolol (TENORMIN) 100 MG tablet Take 50 mg by mouth daily.     [provider]  atorvastatin (LIPITOR) 20 MG tablet Take 1 tablet (20 mg total) by mouth daily. 12/01/20 12/01/21  [name], FNP  Blood Glucose Monitoring Suppl (ACCU-CHEK GUIDE) w/Device KIT USE TO CHECK BLOOD SUGAR  TWICE DAILY AT 10AM AND 5PM 05/10/21   [name], FNP  carboxymethylcellulose (REFRESH PLUS) 0.5 % SOLN 1 drop 3 (three) times daily as needed.    [provider]  D3-50 50000 units capsule  01/14/17   [provider]  dapagliflozin propanediol (FARXIGA) 10 MG TABS tablet Take 1 tablet (10 mg total) by mouth daily before breakfast. 09/21/21   [name], FNP  diclofenac sodium (VOLTAREN) 1 % GEL Apply 2 g topically 4 (four) times daily. 10/30/18   [name], FNP  glimepiride (AMARYL) 2 MG tablet TAKE 1 TABLET BY MOUTH IN  THE MORNING AND AT BEDTIME 07/01/21   [name], FNP  glucose blood (ACCU-CHEK GUIDE) test strip Use as instructed 05/17/21   [name], FNP  [name] KIT  Use to test blood sugar twice daily as directed. E11.65 05/02/19   [name], FNP  latanoprost (XALATAN) 0.005 % ophthalmic solution 1 drop at bedtime. 05/16/19   [provider]  losartan (COZAAR) 50 MG tablet Take 50 mg by mouth daily.    [provider]  Magnesium 200 MG TABS Take 1 tab by mouth daily with evening meal 07/30/19   [name], FNP  naproxen (NAPROSYN) 500 MG tablet Take 1 tablet (500 mg total) by mouth 2 (two) times daily. Patient not taking: Reported on 09/08/2021 10/17/19   Marney Setting, NP  ondansetron (ZOFRAN ODT) 4 MG disintegrating tablet Take 1 tablet (4 mg total) by mouth every 8 (eight) hours as needed for nausea or vomiting. 11/29/20   Alroy Bailiff, Marg[place], PA-C  RYBELSUS 7 MG TABS Take 1 tablet by mouth daily. 08/09/21   [name], FNP    Family History Family History  Problem Relation  Age of Onset   Hypertension Mother    Hypertension Father    Cancer Father        ?lung   Diabetes Sister     Social History Social History   Tobacco Use   Smoking status: Former    Types: Cigarettes   Smokeless tobacco: Never  Vaping Use   Vaping Use: Never used  Substance Use Topics   Alcohol use: No   Drug use: No     Allergies   Patient has no known allergies.   Review of Systems Review of Systems  Constitutional: Negative.   Respiratory: Negative.    Cardiovascular: Negative.   Musculoskeletal:  Positive for joint swelling. Negative for arthralgias, back pain, gait problem, myalgias, neck pain and neck stiffness.  Skin: Negative.   Neurological: Negative.      Physical Exam Triage Vital Signs ED Triage Vitals  Enc Vitals Group     BP 09/25/21 1136 118/78     Pulse Rate 09/25/21 1136 98     Resp 09/25/21 1136 18     Temp 09/25/21 1136 97.7 F (36.5 C)     Temp src --      SpO2 09/25/21 1136 95 %     Weight --      Height --      Head Circumference --      Peak Flow --      Pain Score 09/25/21 1133 8     Pain Loc --      Pain Edu? --      Excl. in [place]? --    No data found.  Updated Vital Signs BP 118/78   Pulse 98   Temp 97.7 F (36.5 C)   Resp 18   SpO2 95%   Visual Acuity Right Eye Distance:   Left Eye Distance:   Bilateral Distance:    Right Eye Near:   Left Eye Near:    Bilateral Near:     Physical Exam Constitutional:      Appearance: Normal appearance.  HENT:     Head: Normocephalic.  Eyes:     Extraocular Movements: Extraocular movements intact.  Pulmonary:     Effort: Pulmonary effort is normal.  Musculoskeletal:     Comments: Mild to moderate swelling, tenderness and erythema noted to the volar aspect of the left wrist without point tenderness, erythema extending halfway up the anterior aspect of the forearm, range of motion is intact but elicits pain with rotation, 2+ radial pulse, sensation intact  Skin:    General:  Skin is warm and dry.  Neurological:     Mental Status: She is alert and oriented to person, place, and time. Mental status is at baseline.  Psychiatric:        Mood and Affect: Mood normal.        Behavior: Behavior normal.      UC Treatments / Results  Labs (all labs ordered are listed, but only abnormal results are displayed) Labs Reviewed - No data to display  EKG   Radiology No results found.  Procedures Procedures (including critical care time)  Medications Ordered in UC Medications - No data to display  Initial Impression / Assessment and Plan / UC Course  I have reviewed the triage vital signs and the nursing notes.  Pertinent labs & imaging results that were available during my care of the patient were reviewed by me and considered in my medical decision making (see chart for details).  Left wrist pain  Due to lack of injury will defer x-ray, discussed with patient, low suspicion for infectious cause as symptoms begin to improve after use of RICE, will move forward with treatment for musculoskeletal cause, most likely inflammatory such as arthritis, discussed with patient, methylprednisolone injection given in office, will defer oral steroid course due to history of diabetes, discussed effects of injection to blood sugars, advised to monitor closely, prescribed naproxen for outpatient management, may use Tylenol in conjunction, may continue use of compression, ice and elevation, may follow-up with urgent care or PCP if symptoms continue to persist or worsen Final Clinical Impressions(s) / UC Diagnoses   Final diagnoses:  None   Discharge Instructions   None    ED Prescriptions   None    PDMP not reviewed this encounter.   Hans Eden, NP 09/25/21 1812

## 2021-09-25 NOTE — Discharge Instructions (Signed)
Your pain is most likely caused by inflammation to the joint space, low suspicion for an infectious cause as symptoms are improving with compression, low suspicion for injury to the bone as no injury occurred  You have been given an injection of a steroid here in office to help reduce inflammation and anterior now with your pain, your blood sugars may be temporarily elevated for the next 24 hours  Starting tomorrow begin use of naproxen twice daily for 5 days then you may use as needed, this medicine is to continue the reduction of inflammation, you may use Tylenol 500 mg every 6 hours in addition to this  You may use heating pad in 15 minute intervals as needed for additional comfort, within the first 2-3 days you may find comfort in using ice in 10-15 minutes over affected area  May wear wrist brace as needed for stability and support  May prop arm to pillow while sitting and lying for additional comfort  Please follow-up with urgent care or primary doctor for reevaluation as needed

## 2021-09-26 ENCOUNTER — Other Ambulatory Visit: Payer: Self-pay

## 2021-09-26 MED ORDER — DAPAGLIFLOZIN PROPANEDIOL 10 MG PO TABS: 10.0000 mg | ORAL_TABLET | Freq: Every day | ORAL | 1 refills | Status: DC

## 2021-09-30 ENCOUNTER — Telehealth: Payer: Self-pay

## 2021-09-30 ENCOUNTER — Other Ambulatory Visit: Payer: Self-pay | Admitting: Nurse Practitioner

## 2021-09-30 NOTE — Telephone Encounter (Signed)
   Telephone encounter was:  Successful.  09/30/2021 Name: Joanna Sandoval MRN: [id number] DOB: 1945/06/30  Joanna Sandoval is a 76 y.o. year old female who is a primary care patient of [name], Lena . The community resource team was consulted for assistance with Food Insecurity and Financial Difficulties related to financial strain  Care guide performed the following interventions: Patient provided with information about care guide support team and interviewed to confirm resource needs.Follow up for mailed resources, the Patient did receive the resources   Follow Up Plan:  No further follow up planned at this time. The patient has been provided with needed resources.    [facility], Care Management  [phone] [address], [place], [place] [address] Phone: [phone] Email: [email or web address]

## 2021-10-11 ENCOUNTER — Ambulatory Visit (INDEPENDENT_AMBULATORY_CARE_PROVIDER_SITE_OTHER): Payer: Medicare Other

## 2021-10-11 ENCOUNTER — Telehealth: Payer: Medicare Other

## 2021-10-11 DIAGNOSIS — I10 Essential (primary) hypertension: Secondary | ICD-10-CM

## 2021-10-11 DIAGNOSIS — N200 Calculus of kidney: Secondary | ICD-10-CM

## 2021-10-11 DIAGNOSIS — I700 Atherosclerosis of aorta: Secondary | ICD-10-CM

## 2021-10-11 DIAGNOSIS — E559 Vitamin D deficiency, unspecified: Secondary | ICD-10-CM

## 2021-10-11 DIAGNOSIS — E119 Type 2 diabetes mellitus without complications: Secondary | ICD-10-CM

## 2021-10-11 NOTE — Chronic Care Management (AMB) (Signed)
Chronic Care Management   CCM RN Visit Note  10/11/2021 Name: Joanna Sandoval MRN: [id number] DOB: 11-01-1945  Subjective: Joanna Sandoval is a 76 y.o. year old female who is a primary care patient of [name], [place]. The care management team was consulted for assistance with disease management and care coordination needs.    Engaged with patient by telephone for follow up visit in response to provider referral for case management and/or care coordination services.   Consent to Services:  The patient was given information about Chronic Care Management services, agreed to services, and gave verbal consent prior to initiation of services.  Please see initial visit note for detailed documentation.   Patient agreed to services and verbal consent obtained.   Assessment: Review of patient past medical history, allergies, medications, health status, including review of consultants reports, laboratory and other test data, was performed as part of comprehensive evaluation and provision of chronic care management services.   SDOH (Social Determinants of Health) assessments and interventions performed:  Yes, no acute needs   CCM Care Plan  No Known Allergies  Outpatient Encounter Medications as of 10/11/2021  Medication Sig   Accu-Chek Softclix Lancets lancets USE AS DIRECTED   albuterol (PROVENTIL HFA;VENTOLIN HFA) 108 (90 Base) MCG/ACT inhaler Inhale 2 puffs into the lungs every 6 (six) hours as needed.   atenolol (TENORMIN) 100 MG tablet Take 50 mg by mouth daily.    atorvastatin (LIPITOR) 20 MG tablet Take 1 tablet (20 mg total) by mouth daily.   Blood Glucose Monitoring Suppl (ACCU-CHEK GUIDE) w/Device KIT USE TO CHECK BLOOD SUGAR  TWICE DAILY AT 10AM AND 5PM   carboxymethylcellulose (REFRESH PLUS) 0.5 % SOLN 1 drop 3 (three) times daily as needed.   D3-50 50000 units capsule    dapagliflozin propanediol (FARXIGA) 10 MG TABS tablet Take 1 tablet (10 mg total) by mouth daily before  breakfast.   diclofenac sodium (VOLTAREN) 1 % GEL Apply 2 g topically 4 (four) times daily.   glimepiride (AMARYL) 2 MG tablet TAKE 1 TABLET BY MOUTH IN  THE MORNING AND AT BEDTIME   glucose blood (ACCU-CHEK GUIDE) test strip Use as instructed   [name] KIT Use to test blood sugar twice daily as directed. E11.65   latanoprost (XALATAN) 0.005 % ophthalmic solution 1 drop at bedtime.   losartan (COZAAR) 50 MG tablet Take 50 mg by mouth daily.   Magnesium 200 MG TABS Take 1 tab by mouth daily with evening meal   naproxen (NAPROSYN) 375 MG tablet Take 1 tablet (375 mg total) by mouth 2 (two) times daily.   ondansetron (ZOFRAN ODT) 4 MG disintegrating tablet Take 1 tablet (4 mg total) by mouth every 8 (eight) hours as needed for nausea or vomiting.   RYBELSUS 7 MG TABS Take 1 tablet by mouth daily.   No facility-administered encounter medications on file as of 10/11/2021.    Patient Active Problem List   Diagnosis Date Noted   Colon cancer screening 05/05/2020   Morbid obesity ([place]) 05/05/2020   Type 2 diabetes mellitus without complication, without long-term current use of insulin ([place]) 07/31/2018   Essential hypertension 04/30/2018   Fibroid 11/11/2011    Conditions to be addressed/monitored: Type 2 diabetes mellitus, Essential HTN, Vitamin D deficiency, Bilateral Nephrolithiasis, Aortic Atherosclerosis   Care Plan : RN Care Manager Plan of Care  Updates made by [name], RN since 10/11/2021 12:00 AM     Problem: No plan of care established  for management of chronic disease states (Type 2 diabetes mellitus, Essential HTN, Vitamin D deficiency, Bilateral Nephrolithiasis, Aortic Atherosclerosis)   Priority: High     Long-Range Goal: Development of plan of care for chronic disease management for Type 2 diabetes mellitus, Essential HTN, Vitamin D deficiency, Bilateral Nephrolithiasis, Aortic Atherosclerosis   Start Date: 02/16/2021  Expected End  Date: 02/16/2022  Recent Progress: On track  Priority: High  Note:   Current Barriers:  Knowledge Deficits related to plan of care for management of Type 2 diabetes mellitus, Essential HTN, Vitamin D deficiency, Bilateral Nephrolithiasis, Aortic Atherosclerosis  Chronic Disease Management support and education needs related to Type 2 diabetes mellitus, Essential HTN, Vitamin D deficiency, Bilateral Nephrolithiasis, Aortic Atherosclerosis  Financial Constraints  RNCM Clinical Goal(s):  Patient will verbalize basic understanding of  Type 2 diabetes mellitus, Essential HTN, Vitamin D deficiency, Bilateral Nephrolithiasis, Aortic Atherosclerosis  disease process and self health management plan   demonstrate Improved health management independence   continue to work with RN Care Manager to address care management and care coordination needs related to  Type 2 diabetes mellitus, Essential HTN, Vitamin D deficiency, Bilateral Nephrolithiasis, Aortic Atherosclerosis  will demonstrate ongoing self health care management ability    through collaboration with RN Care manager, provider, and care team.   Interventions: 1:1 collaboration with primary care provider regarding development and update of comprehensive plan of care as evidenced by provider attestation and co-signature Inter-disciplinary care team collaboration (see longitudinal plan of care) Evaluation of current treatment plan related to  self management and patient's adherence to plan as established by provider  Hyperlipidemia Interventions:  (Status:  Condition stable.  Not addressed this visit.) Long Term Goal Medication review performed; medication list updated in electronic medical record.  Provider established cholesterol goals reviewed Counseled on importance of regular laboratory monitoring as prescribed Reviewed importance of limiting foods high in cholesterol Reviewed exercise goals and target of 150 minutes per week  Lipid Panel      Component Value Date/Time   CHOL 145 08/09/2021 1018   TRIG 108 08/09/2021 1018   HDL 52 08/09/2021 1018   CHOLHDL 2.8 08/09/2021 1018   CHOLHDL 4.6 10/12/2006 0840   VLDL 16 10/12/2006 0840   LDLCALC 73 08/09/2021 1018   LABVLDL 20 08/09/2021 1018     Vitamin D deficiency Interventions: Status: (Condition stable.  Not addressed this visit.) Long Term Goal Evaluation of current treatment plan related to  Vitamin D deficiency , self-management and patient's adherence to plan as established by provider. Discussed plans with patient for ongoing care management follow up and provided patient with direct contact information for care management team Provided education to patient re: try to get at least 15 minutes of natural sunlight daily when possible; eat a Vitamin D rich diet ; Reviewed medications with patient and discussed importance of medication adherence, take Vitamin D supplement exactly as prescribed ; Discussed plans with patient for ongoing care management follow up and provided patient with direct contact information for care management team;   Chronic Kidney Disease Interventions:  (Status:  Condition stable.  Not addressed this visit.) Long Term Goal Assessed the Patient understanding of chronic kidney disease    Evaluation of current treatment plan related to chronic kidney disease self management and patient's adherence to plan as established by provider      Reviewed prescribed diet increase water intake to 48-64 oz daily  Advised patient, providing education and rationale, to monitor blood pressure daily and record, calling  PCP for findings outside established parameters    Discussed complications of poorly controlled blood pressure such as heart disease, stroke, circulatory complications, vision complications, kidney impairment, sexual dysfunction    Provided education on kidney disease progression    Last practice recorded BP readings:  BP Readings from Last 3 Encounters:   08/09/21 122/80  05/23/21 122/60  02/14/21 124/78  Most recent eGFR/CrCl:  Lab Results  Component Value Date   EGFR 64 08/09/2021    No components found for: CRCL   Diabetes Interventions:  (Status:  Condition stable.  Not addressed this visit.) Long Term Goal Assessed patient's understanding of A1c goal: <7% Provided education to patient about basic DM disease process Reviewed medications with patient and discussed importance of medication adherence Counseled on importance of regular laboratory monitoring as prescribed Advised patient, providing education and rationale, to check cbg daily before meals and at bedtime and record, calling PCP for findings outside established parameters Review of patient status, including review of consultants reports, relevant laboratory and other test results, and medications completed Assessed social determinant of health barriers Educated patient on dietary and exercise recommendations; daily glycemic control FBS 80-130, <180 after meals;15'15' rule Mailed printed educational materials related to Chair Exercises; Diabetes Care Schedule; How to prevent complications from diabetes Lab Results  Component Value Date   HGBA1C 7.5 (H) 08/09/2021   Acute left wrist pain and swelling from unknown cause:  (Status:  New goal.)  Short Term Goal Evaluation of current treatment plan related to  left wrist pain and swelling from unknown cause , self-management and patient's adherence to plan as established by provider Discussed and reviewed recent urgent care visit due to patient experienced acute swelling and pain to her left wrist with unknown cause Reviewed and discussed MD recommendations to include taking Naproxen as needed, wearing left wrist brace as needed report new concerns to PCP Determined patient has followed MD recommendations, she reports symptoms have improved as evidence by patient is reporting decreased swelling and pain, she is not taking Naproxen  and is wearing her brace as directed Educated patient on non-pharmacologic interventions such as alternating moist heat and ice, elevate extremity when resting, avoid lifting heavy objects Instructed patient to report new symptoms or concerns to her PCP promptly  Discussed plans with patient for ongoing care management follow up and provided patient with direct contact information for care management team   Patient Goals/Self-Care Activities: Take all medications as prescribed Attend all scheduled provider appointments Call pharmacy for medication refills 3-7 days in advance of running out of medications Perform IADL's (shopping, preparing meals, housekeeping, managing finances) independently Call provider office for new concerns or questions  schedule appointment with eye doctor check feet daily for cuts, sores or redness drink 6 to 8 glasses of water each day manage portion size adhere to prescribed diet: low trans/Sat fat develop an exercise routine   Follow Up Plan:  Telephone follow up appointment with care management team member scheduled for:  12/14/21     Angel Little, RN, BSN, CCM Care Management Coordinator [facility]  Direct Phone: [phone]        

## 2021-10-14 DIAGNOSIS — N189 Chronic kidney disease, unspecified: Secondary | ICD-10-CM | POA: Diagnosis not present

## 2021-10-14 DIAGNOSIS — E1122 Type 2 diabetes mellitus with diabetic chronic kidney disease: Secondary | ICD-10-CM | POA: Diagnosis not present

## 2021-10-14 DIAGNOSIS — E785 Hyperlipidemia, unspecified: Secondary | ICD-10-CM

## 2021-10-19 ENCOUNTER — Other Ambulatory Visit: Payer: Self-pay | Admitting: Internal Medicine

## 2021-10-19 DIAGNOSIS — Z1231 Encounter for screening mammogram for malignant neoplasm of breast: Secondary | ICD-10-CM

## 2021-10-20 ENCOUNTER — Ambulatory Visit
Admission: RE | Admit: 2021-10-20 | Discharge: 2021-10-20 | Disposition: A | Discharge status: Home / Self Care | Payer: Medicare Other | Source: Ambulatory Visit | Attending: Internal Medicine | Admitting: Internal Medicine

## 2021-10-20 DIAGNOSIS — Z1231 Encounter for screening mammogram for malignant neoplasm of breast: Secondary | ICD-10-CM | POA: Diagnosis not present

## 2021-10-24 ENCOUNTER — Telehealth: Payer: Medicare Other

## 2021-10-24 ENCOUNTER — Telehealth: Payer: Self-pay

## 2021-10-24 NOTE — Chronic Care Management (AMB) (Signed)
    Called Joanna Sandoval, No answer, left message of appointment on 10-26-2021 at 12:00 via telephone visit with [name], Pharm D. Notified to have all medications, supplements, blood pressure and/or blood sugar logs available during appointment and to return call if need to reschedule.   [place] Pharmacist Assistant [phone]

## 2021-10-25 ENCOUNTER — Ambulatory Visit (INDEPENDENT_AMBULATORY_CARE_PROVIDER_SITE_OTHER): Payer: Medicare Other

## 2021-10-25 DIAGNOSIS — I700 Atherosclerosis of aorta: Secondary | ICD-10-CM

## 2021-10-25 DIAGNOSIS — I10 Essential (primary) hypertension: Secondary | ICD-10-CM

## 2021-10-25 DIAGNOSIS — E119 Type 2 diabetes mellitus without complications: Secondary | ICD-10-CM

## 2021-10-25 NOTE — Chronic Care Management (AMB) (Signed)
Chronic Care Management    Social Work Note  10/25/2021 Name: Joanna Sandoval MRN: [id number] DOB: 1945-07-29  Joanna Sandoval is a 76 y.o. year old female who is a primary care patient of [name], [place]. The CCM team was consulted to assist the patient with chronic disease management and/or care coordination needs related to:  DM II, HTN, Aortic Atherosclerosis .   Engaged with patient by telephone for follow up visit in response to provider referral for social work chronic care management and care coordination services.   Consent to Services:  The patient was given information about Chronic Care Management services, agreed to services, and gave verbal consent prior to initiation of services.  Please see initial visit note for detailed documentation.   Patient agreed to services and consent obtained.   Assessment: Review of patient past medical history, allergies, medications, and health status, including review of relevant consultants reports was performed today as part of a comprehensive evaluation and provision of chronic care management and care coordination services.     SDOH (Social Determinants of Health) assessments and interventions performed:    Advanced Directives Status: Not addressed in this encounter.  CCM Care Plan  No Known Allergies  Outpatient Encounter Medications as of 10/25/2021  Medication Sig   Accu-Chek Softclix Lancets lancets USE AS DIRECTED   albuterol (PROVENTIL HFA;VENTOLIN HFA) 108 (90 Base) MCG/ACT inhaler Inhale 2 puffs into the lungs every 6 (six) hours as needed.   atenolol (TENORMIN) 100 MG tablet Take 50 mg by mouth daily.    atorvastatin (LIPITOR) 20 MG tablet Take 1 tablet (20 mg total) by mouth daily.   Blood Glucose Monitoring Suppl (ACCU-CHEK GUIDE) w/Device KIT USE TO CHECK BLOOD SUGAR  TWICE DAILY AT 10AM AND 5PM   carboxymethylcellulose (REFRESH PLUS) 0.5 % SOLN 1 drop 3 (three) times daily as needed.   D3-50 50000 units capsule     dapagliflozin propanediol (FARXIGA) 10 MG TABS tablet Take 1 tablet (10 mg total) by mouth daily before breakfast.   diclofenac sodium (VOLTAREN) 1 % GEL Apply 2 g topically 4 (four) times daily.   glimepiride (AMARYL) 2 MG tablet TAKE 1 TABLET BY MOUTH IN  THE MORNING AND AT BEDTIME   glucose blood (ACCU-CHEK GUIDE) test strip Use as instructed   [name] KIT Use to test blood sugar twice daily as directed. E11.65   latanoprost (XALATAN) 0.005 % ophthalmic solution 1 drop at bedtime.   losartan (COZAAR) 50 MG tablet Take 50 mg by mouth daily.   Magnesium 200 MG TABS Take 1 tab by mouth daily with evening meal   naproxen (NAPROSYN) 375 MG tablet Take 1 tablet (375 mg total) by mouth 2 (two) times daily.   ondansetron (ZOFRAN ODT) 4 MG disintegrating tablet Take 1 tablet (4 mg total) by mouth every 8 (eight) hours as needed for nausea or vomiting.   RYBELSUS 7 MG TABS Take 1 tablet by mouth daily.   No facility-administered encounter medications on file as of 10/25/2021.    Patient Active Problem List   Diagnosis Date Noted   Colon cancer screening 05/05/2020   Morbid obesity ([place]) 05/05/2020   Type 2 diabetes mellitus without complication, without long-term current use of insulin ([place]) 07/31/2018   Essential hypertension 04/30/2018   Fibroid 11/11/2011    Conditions to be addressed/monitored: HTN, DMII, and Aortic Atherosclerosis ; Financial constraints related to costs of living  Care Plan : Social Work Plan of Care  Updates made by  [name] since 10/25/2021 12:00 AM     Problem: Quality of Life (General Plan of Care)      Goal: Quality of Life Maintained Completed 10/25/2021  Start Date: 08/16/2021  Recent Progress: On track  Priority: High  Note:   Current Barriers:  Chronic disease management support and education needs related to HTN, DM, and Aortic Atherosclerosis   Financial constraints related to costs of food and dental care  Social  Worker Clinical Goal(s):  patient will work with SW to identify and address any acute and/or chronic care coordination needs related to the self health management of HTN, DM, and Aortic Atherosclerosis   explore community resource options for unmet needs related to:  Museum/gallery curator Strain  SW Interventions:  Inter-disciplinary care team collaboration (see longitudinal plan of care) Collaboration with [name], FNP regarding development and update of comprehensive plan of care as evidenced by provider attestation and co-signature Telephonic visit completed with the patient to assess goal progression Discussed the patient has yet to turn in [place] application and is still working on it - no barriers identified regarding inability to completed Reviewed the patient is struggling to cover costs of living expenses due to rent increasing by $50 and the patients food stamp benefit being cut from $300 to $70 monthly Patient reports she has thought about moving in with her sister in [place] but her son wants her to stay in [place] and plans to help cover living expenses if and when needed Discussed patient has a large support system including her son, Geographical information systems officer, and church family Discussed plan for SW to sign off at this time with plan for patient to contact the practice as needed for future resource assistance  Patient Goals/Self-Care Activities patient will:   -  Receive support as needed from family members -Complete Medicaid application and submit to [place] primary care providers office as needed        Follow Up Plan:  No SW follow up planned at this time. The patient will contact her primary care providers office as needed.      [name], BSW, CDP Social Worker, Certified Dementia Practitioner [place] [facility] [phone]

## 2021-10-25 NOTE — Patient Instructions (Signed)
Social Worker Visit Information  Goals we discussed today:  Patient Goals/Self-Care Activities patient will:   - Receive support as needed from family members -Complete Medicaid application and submit to [place] primary care providers office as needed  Materials Provided: Verbal education about resources provided by phone  Patient verbalizes understanding of instructions and care plan provided today and agrees to view in [place]. Active MyChart status and patient understanding of how to access instructions and care plan via MyChart confirmed with patient.     Follow Up Plan:  No SW follow up planned at this time. Please call the provider office as needed.   [name], BSW, CDP Social Worker, Certified Dementia Practitioner [place] [facility] [phone]

## 2021-10-26 ENCOUNTER — Telehealth: Payer: Medicare Other

## 2021-10-26 ENCOUNTER — Telehealth: Payer: Self-pay

## 2021-10-26 NOTE — Chronic Care Management (AMB) (Signed)
  Chronic Care Management Pharmacy Assistant   Name: Joanna Sandoval  MRN: [id number] DOB: 12/16/1945  Reason for Encounter: Disease State/ General  Recent office visits:  10-25-2021 Humble, Kendra (CCM).  10-11-2021 Little, Angel L, RN (CCM)  Recent consult visits:  None  Hospital visits:  Medication Reconciliation was completed by comparing discharge summary, patient's EMR and Pharmacy list, and upon discussion with patient.  Admitted to the hospital on 09-25-2021 due to Left wrist pain. Discharge date was 09-25-2021. Discharged from [facility] urgent care.    New?Medications Started at Hospital Discharge:?? None  Medication Changes at Hospital Discharge: None  Medications Discontinued at Hospital Discharge: None  Medications that remain the same after Hospital Discharge:??  -All other medications will remain the same.    Medications: Outpatient Encounter Medications as of 10/26/2021  Medication Sig   Accu-Chek Softclix Lancets lancets USE AS DIRECTED   albuterol (PROVENTIL HFA;VENTOLIN HFA) 108 (90 Base) MCG/ACT inhaler Inhale 2 puffs into the lungs every 6 (six) hours as needed.   atenolol (TENORMIN) 100 MG tablet Take 50 mg by mouth daily.    atorvastatin (LIPITOR) 20 MG tablet Take 1 tablet (20 mg total) by mouth daily.   Blood Glucose Monitoring Suppl (ACCU-CHEK GUIDE) w/Device KIT USE TO CHECK BLOOD SUGAR  TWICE DAILY AT 10AM AND 5PM   carboxymethylcellulose (REFRESH PLUS) 0.5 % SOLN 1 drop 3 (three) times daily as needed.   D3-50 50000 units capsule    dapagliflozin propanediol (FARXIGA) 10 MG TABS tablet Take 1 tablet (10 mg total) by mouth daily before breakfast.   diclofenac sodium (VOLTAREN) 1 % GEL Apply 2 g topically 4 (four) times daily.   glimepiride (AMARYL) 2 MG tablet TAKE 1 TABLET BY MOUTH IN  THE MORNING AND AT BEDTIME   glucose blood (ACCU-CHEK GUIDE) test strip Use as instructed   [name] KIT Use to test blood  sugar twice daily as directed. E11.65   latanoprost (XALATAN) 0.005 % ophthalmic solution 1 drop at bedtime.   losartan (COZAAR) 50 MG tablet Take 50 mg by mouth daily.   Magnesium 200 MG TABS Take 1 tab by mouth daily with evening meal   naproxen (NAPROSYN) 375 MG tablet Take 1 tablet (375 mg total) by mouth 2 (two) times daily.   ondansetron (ZOFRAN ODT) 4 MG disintegrating tablet Take 1 tablet (4 mg total) by mouth every 8 (eight) hours as needed for nausea or vomiting.   RYBELSUS 7 MG TABS Take 1 tablet by mouth daily.   No facility-administered encounter medications on file as of 10/26/2021.  Contacted Cordie Christiansen for General Review Call   Chart Review:  Have there been any documented new, changed, or discontinued medications since last visit? No  Has there been any documented recent hospitalizations or ED visits since last visit with Clinical Pharmacist? Yes Brief Summary: Refer to chart review  Adherence Review:  Does the Clinical Pharmacist Assistant have access to adherence rates? Yes Adherence rates for STAR metric medications (Refer to star drugs list). Adherence rates for medications indicated for disease state being reviewed (None). Does the patient have >5 day gap between last estimated fill dates for any of the above medications or other medication gaps? No Reason for medication gaps.   Disease State Questions:  Able to connect with Patient? Yes  Did patient have any problems with their health recently? No  Have you had any admissions or emergency room visits or worsening of your condition(s) since last visit?   Yes Details of ED visit, hospital visit and/or worsening condition(s): Refer to chart review  Have you had any visits with new specialists or providers since your last visit? No  Have you had any new health care problem(s) since your last visit? No  Have you run out of any of your medications since you last spoke with clinical pharmacist? No  Are  there any medications you are not taking as prescribed? No  Are you having any issues or side effects with your medications? No  Do you have any other health concerns or questions you want to discuss with your Clinical Pharmacist before your next visit? No  Are there any health concerns that you feel we can do a better job addressing? No  Are you having any problems with any of the following since the last visit: (select all that apply)  None  12. Any falls since last visit? No   13. Any increased or uncontrolled pain since last visit? No   14. Next visit Type: office       Visit with: PCP        Date: 12-12-2021        Time: 9:20  15. Additional Details? No    Care Gaps: Yearly ophthalmology overdue  Star Rating Drugs: Atorvastatin 40 mg- Last [facility] 10-23-2021 [facility] Previous filled 08-02-2021 [facility] Rybelsus 7 mg- Last filled 09-27-2021 [facility]. Previous filled 07-07-2021 90 [facility] Losartan 50 mg- Last filled 10-23-2021 [facility]. Previous filled 08-02-2021 [facility] Glimepiride 2 mg- Last filled 10-23-2021 [facility]. Previous filled 07-28-2021 [facility] Farxiga 10 mg- Last filled 09-21-2021 [facility] Previous filled 08-30-2021 30 DS [facility]  Malecca Hicks CMA Clinical Pharmacist Assistant [phone]  

## 2021-11-02 ENCOUNTER — Telehealth: Payer: Self-pay

## 2021-11-02 NOTE — Chronic Care Management (AMB) (Cosign Needed Addendum)
Care Gap(s) Not Met that Need to be Addressed:   Controlling High Blood Pressure   Action Taken: Reviewed patient's chart. Last blood pressure on 09-25-2021 118/78.   Follow Up: None needed   Care Gap(s) Not Met that Need to be Addressed:   Hemoglobin A1c Control for Patients With Diabetes   Action Taken: Reviewed patient's chart. Last A1C 08-09-2021 7.5. PCP appointment 12-12-2021.   Follow Up: None needed  [place] Clinical Pharmacist Assistant [phone]

## 2021-11-14 DIAGNOSIS — E119 Type 2 diabetes mellitus without complications: Secondary | ICD-10-CM

## 2021-11-14 DIAGNOSIS — I10 Essential (primary) hypertension: Secondary | ICD-10-CM | POA: Diagnosis not present

## 2021-11-30 ENCOUNTER — Emergency Department (HOSPITAL_COMMUNITY)
Admission: EM | Admit: 2021-11-30 | Discharge: 2021-11-30 | Disposition: A | Discharge status: Home / Self Care | Payer: Medicare Other | Attending: Emergency Medicine | Admitting: Emergency Medicine

## 2021-11-30 ENCOUNTER — Other Ambulatory Visit: Payer: Self-pay

## 2021-11-30 ENCOUNTER — Emergency Department (HOSPITAL_COMMUNITY): Admit: 2021-11-30 | Discharge: 2021-11-30 | Disposition: A | Discharge status: Home / Self Care | Payer: Medicare Other

## 2021-11-30 DIAGNOSIS — Z79899 Other long term (current) drug therapy: Secondary | ICD-10-CM | POA: Insufficient documentation

## 2021-11-30 DIAGNOSIS — M79662 Pain in left lower leg: Secondary | ICD-10-CM | POA: Diagnosis not present

## 2021-11-30 DIAGNOSIS — E119 Type 2 diabetes mellitus without complications: Secondary | ICD-10-CM | POA: Diagnosis not present

## 2021-11-30 DIAGNOSIS — I10 Essential (primary) hypertension: Secondary | ICD-10-CM | POA: Insufficient documentation

## 2021-11-30 DIAGNOSIS — M79605 Pain in left leg: Secondary | ICD-10-CM | POA: Insufficient documentation

## 2021-11-30 DIAGNOSIS — Z7984 Long term (current) use of oral hypoglycemic drugs: Secondary | ICD-10-CM | POA: Insufficient documentation

## 2021-11-30 NOTE — ED Triage Notes (Signed)
Pt with left lower leg pain and subjective swelling. Pt states she called the [facility] and was advised to come to the ED for ultrasound to r/o DVT

## 2021-11-30 NOTE — Discharge Instructions (Addendum)
You came to the emergency department today to be evaluated for your left leg pain.  The ultrasound did not show any blood clots in your leg.  Your pain may be musculoskeletal in nature.  Please elevate and ice your leg as we discussed.  Please take Tylenol and ibuprofen as indicated below to help with your pain.  Please follow-up with your primary care doctor if pain does not improve.  Please take Ibuprofen (Advil, motrin) and Tylenol (acetaminophen) to relieve your pain.    You may take up to 600 MG (3 pills) of normal strength ibuprofen every 8 hours as needed.   You make take tylenol, up to 1,000 mg (two extra strength pills) every 8 hours as needed.   It is safe to take ibuprofen and tylenol at the same time as they work differently.   Do not take more than 3,000 mg tylenol in a 24 hour period (not more than one dose every 8 hours.  Please check all medication labels as many medications such as pain and cold medications may contain tylenol.  Do not drink alcohol while taking these medications.  Do not take other NSAID'S while taking ibuprofen (such as aleve or naproxen).  Please take ibuprofen with food to decrease stomach upset.

## 2021-11-30 NOTE — ED Provider Triage Note (Signed)
Emergency Medicine Provider Triage Evaluation Note  Joanna Sandoval , a 76 y.o. female  was evaluated in triage.  Pt complains of pain in subjective swelling to left lower leg.  Patient reports that she has had pain to left calf and left popliteal area over the last few days.  Patient reports that she noted some swelling to her left lower leg over this time as well.  Denies any recent falls or injuries.  Review of Systems  Positive: Subjective leg swelling, pain to left lower leg Negative: Chest pain, shortness of breath, hemoptysis, pallor, color change, surgery last 12 weeks, history of DVT/PE, blood thinner use, hormone therapy use  Physical Exam  BP (!) 148/77 (BP Location: Right Arm)   Pulse 92   Temp 98.7 F (37.1 C) (Oral)   Resp 15   SpO2 94%  Gen:   Awake, no distress   Resp:  Normal effort  MSK:   Moves extremities without difficulty; tenderness to left popliteal area.  No tenderness to left calf.  No swelling noted to left lower extremity.  Pulse, motor, and sensation intact distally.  +2 left DP pulse.  No pallor or color change noted to left lower extremity. Other:    Medical Decision Making  Medically screening exam initiated at 9:04 AM.  Appropriate orders placed.  [name] was informed that the remainder of the evaluation will be completed by another provider, this initial triage assessment does not replace that evaluation, and the importance of remaining in the ED until their evaluation is complete.     Loni Beckwith, [place] 11/30/21 [phone]

## 2021-11-30 NOTE — ED Provider Notes (Signed)
[facility] EMERGENCY DEPARTMENT Provider Note   CSN: [id number] Arrival date & time: 11/30/21  0758     History  Chief Complaint  Patient presents with   Leg Pain    Joanna Sandoval is a 76 y.o. female with history of diabetes mellitus, hypertension, arthritis.  Presents to the emergency department the chief complaint of left leg pain and subjective swelling.  Patient reports that this started on Monday.  Patient reports that she started having pain in her left calf and swelling to her left lower leg.  Patient reports that pain moved from her calf to her popliteal area and the swelling resolved.  Patient reports that she has continued pain to her popliteal area at this time.  Patient states that she spoke with nurse line who advised her to come to the emergency department for evaluation of possible DVT.  Patient denies any recent falls or injuries, history of DVT/PE, surgery in the last 12 weeks, hormone therapy, malignancy treatment the last 6 months, chest pain, shortness of breath, hemoptysis, color change, pallor, wound, numbness, weakness.   Leg Pain Associated symptoms: no back pain, no fever and no neck pain        Home Medications Prior to Admission medications   Medication Sig Start Date End Date Taking? Authorizing Provider  Accu-Chek Softclix Lancets lancets USE AS DIRECTED 09/30/21   [name], FNP  albuterol (PROVENTIL HFA;VENTOLIN HFA) 108 (90 Base) MCG/ACT inhaler Inhale 2 puffs into the lungs every 6 (six) hours as needed.    [provider]  atenolol (TENORMIN) 100 MG tablet Take 50 mg by mouth daily.     [provider]  atorvastatin (LIPITOR) 20 MG tablet Take 1 tablet (20 mg total) by mouth daily. 12/01/20 12/01/21  [name], FNP  Blood Glucose Monitoring Suppl (ACCU-CHEK GUIDE) w/Device KIT USE TO CHECK BLOOD SUGAR  TWICE DAILY AT 10AM AND 5PM 05/10/21   [name], FNP  carboxymethylcellulose (REFRESH PLUS) 0.5 %  SOLN 1 drop 3 (three) times daily as needed.    [provider]  D3-50 50000 units capsule  01/14/17   [provider]  dapagliflozin propanediol (FARXIGA) 10 MG TABS tablet Take 1 tablet (10 mg total) by mouth daily before breakfast. 09/26/21   [name], FNP  diclofenac sodium (VOLTAREN) 1 % GEL Apply 2 g topically 4 (four) times daily. 10/30/18   [name], FNP  glimepiride (AMARYL) 2 MG tablet TAKE 1 TABLET BY MOUTH IN  THE MORNING AND AT BEDTIME 07/01/21   [name], FNP  glucose blood (ACCU-CHEK GUIDE) test strip Use as instructed 05/17/21   [name], FNP  [name] KIT Use to test blood sugar twice daily as directed. E11.65 05/02/19   [name], FNP  latanoprost (XALATAN) 0.005 % ophthalmic solution 1 drop at bedtime. 05/16/19   [provider]  losartan (COZAAR) 50 MG tablet Take 50 mg by mouth daily.    [provider]  Magnesium 200 MG TABS Take 1 tab by mouth daily with evening meal 07/30/19   [name], FNP  naproxen (NAPROSYN) 375 MG tablet Take 1 tablet (375 mg total) by mouth 2 (two) times daily. 09/25/21   White, Leitha Schuller, NP  ondansetron (ZOFRAN ODT) 4 MG disintegrating tablet Take 1 tablet (4 mg total) by mouth every 8 (eight) hours as needed for nausea or vomiting. 11/29/20   Alroy Bailiff, Margaux, PA-C  RYBELSUS 7 MG TABS Take 1 tablet by mouth  daily. 08/09/21   [name], FNP      Allergies    Patient has no known allergies.    Review of Systems   Review of Systems  Constitutional:  Negative for chills and fever.  Eyes:  Negative for visual disturbance.  Respiratory:  Negative for shortness of breath.   Cardiovascular:  Positive for leg swelling. Negative for chest pain.  Musculoskeletal:  Positive for myalgias. Negative for back pain and neck pain.  Skin:  Negative for color change, pallor, rash and wound.  Neurological:  Negative for dizziness, syncope, weakness, light-headedness,  numbness and headaches.  Psychiatric/Behavioral:  Negative for confusion.     Physical Exam Updated Vital Signs BP (!) 148/77 (BP Location: Right Arm)   Pulse 92   Temp 98.7 F (37.1 C) (Oral)   Resp 15   SpO2 94%  Physical Exam Vitals and nursing note reviewed.  Constitutional:      General: She is not in acute distress.    Appearance: She is not ill-appearing, toxic-appearing or diaphoretic.  HENT:     Head: Normocephalic.  Eyes:     General: No scleral icterus.       Right eye: No discharge.        Left eye: No discharge.  Cardiovascular:     Rate and Rhythm: Normal rate.     Pulses:          Dorsalis pedis pulses are 2+ on the right side and 2+ on the left side.  Pulmonary:     Effort: Pulmonary effort is normal.  Musculoskeletal:     Left knee: No swelling, deformity, effusion, erythema, ecchymosis, lacerations, bony tenderness or crepitus. Normal range of motion. No tenderness. Normal alignment.     Right lower leg: No swelling, deformity, lacerations, tenderness or bony tenderness. No edema.     Left lower leg: No swelling, deformity, lacerations, tenderness or bony tenderness. No edema.     Right ankle: No swelling, deformity, ecchymosis or lacerations. No tenderness. Normal range of motion.     Left ankle: No swelling, deformity, ecchymosis or lacerations. No tenderness. Normal range of motion.     Right foot: Normal range of motion and normal capillary refill. No swelling, deformity, laceration, tenderness, bony tenderness or crepitus. Normal pulse.     Left foot: Normal range of motion and normal capillary refill. No swelling, deformity, laceration, tenderness, bony tenderness or crepitus. Normal pulse.  Skin:    General: Skin is warm and dry.  Neurological:     General: No focal deficit present.     Mental Status: She is alert.  Psychiatric:        Behavior: Behavior is cooperative.     ED Results / Procedures / Treatments   Labs (all labs ordered are  listed, but only abnormal results are displayed) Labs Reviewed - No data to display  EKG None  Radiology No results found.  Procedures Procedures    Medications Ordered in ED Medications - No data to display  ED Course/ Medical Decision Making/ A&P                           Medical Decision Making  Alert 76 year old female in no acute stress, nontoxic-appearing.  Presents to the emergency department with a complaint of left lower leg pain and subjective swelling.  Information was obtained from patient.  I reviewed patient's past medical records including previous provider notes, labs, and imaging.  Patient  has medical history as outlined HPI which complicates her care.  Due to concern for possible DVT to left lower extremity will obtain ultrasound imaging.  Ultrasound imaging showed no signs of DVT.  Patient has full range of motion to left knee.  No recent falls or traumatic injury.  Low suspicion for acute osseous abnormality at this time.  Patient is able to stand and ambulate without difficulty using cane which is her baseline.  No erythema, warmth, or swelling to suggest infection.  Suspect that patient's pain is musculoskeletal in nature.  Patient advised to use Tylenol, ibuprofen, ice, and elevation.  Patient to follow-up with PCP if symptoms not improved.  Patient care discussed with attending physician Dr. Francia Greaves.  Based on patient's chief complaint, I considered admission might be necessary, however after reassuring ED workup feel patient is reasonable for discharge.  Discussed results, findings, treatment and follow up. Patient advised of return precautions. Patient verbalized understanding and agreed with plan.  Portions of this note were generated with [employer]. Dictation errors may occur despite best attempts at proofreading.         Final Clinical Impression(s) / ED Diagnoses Final diagnoses:  Left leg pain    Rx / DC Orders ED  Discharge Orders     None         Loni Beckwith, PA-C 11/30/21 1009    Valarie Merino, MD 12/01/21 [phone]

## 2021-11-30 NOTE — Progress Notes (Signed)
Left LE venous duplex study completed. Please see CV Proc for preliminary results.  Almee Pelphrey BS, RVT 11/30/2021 9:42 AM

## 2021-12-07 DIAGNOSIS — I10 Essential (primary) hypertension: Secondary | ICD-10-CM | POA: Diagnosis not present

## 2021-12-07 DIAGNOSIS — E119 Type 2 diabetes mellitus without complications: Secondary | ICD-10-CM | POA: Diagnosis not present

## 2021-12-07 DIAGNOSIS — E785 Hyperlipidemia, unspecified: Secondary | ICD-10-CM | POA: Diagnosis not present

## 2021-12-09 ENCOUNTER — Telehealth: Payer: Self-pay

## 2021-12-09 NOTE — Telephone Encounter (Addendum)
Transition Care Management Unsuccessful Follow-up Telephone Call  Date of discharge and from where:  11/30/2021 [place]  Attempts:  1st Attempt  Reason for unsuccessful TCM follow-up call:  Left voice message

## 2021-12-12 ENCOUNTER — Ambulatory Visit (INDEPENDENT_AMBULATORY_CARE_PROVIDER_SITE_OTHER): Payer: Medicare Other | Admitting: Nurse Practitioner

## 2021-12-12 ENCOUNTER — Encounter: Payer: Self-pay | Admitting: Nurse Practitioner

## 2021-12-12 VITALS — BP 130/80 | HR 91 | Temp 98.1°F | Ht 61.0 in | Wt 198.0 lb

## 2021-12-12 DIAGNOSIS — I700 Atherosclerosis of aorta: Secondary | ICD-10-CM

## 2021-12-12 DIAGNOSIS — E1169 Type 2 diabetes mellitus with other specified complication: Secondary | ICD-10-CM | POA: Diagnosis not present

## 2021-12-12 DIAGNOSIS — E559 Vitamin D deficiency, unspecified: Secondary | ICD-10-CM

## 2021-12-12 DIAGNOSIS — E6609 Other obesity due to excess calories: Secondary | ICD-10-CM

## 2021-12-12 DIAGNOSIS — Z6837 Body mass index (BMI) 37.0-37.9, adult: Secondary | ICD-10-CM

## 2021-12-12 DIAGNOSIS — E119 Type 2 diabetes mellitus without complications: Secondary | ICD-10-CM

## 2021-12-12 DIAGNOSIS — F329 Major depressive disorder, single episode, unspecified: Secondary | ICD-10-CM

## 2021-12-12 DIAGNOSIS — I10 Essential (primary) hypertension: Secondary | ICD-10-CM

## 2021-12-12 NOTE — Progress Notes (Signed)
This encounter was created in error - please disregard.

## 2021-12-12 NOTE — Patient Instructions (Signed)

## 2021-12-12 NOTE — Progress Notes (Unsigned)
I,Tianna Badgett,acting as a Education administrator for [employer], FNP.,have documented all relevant documentation on the behalf of [name], FNP,as directed by  [name], FNP while in the presence of [name], [place].  Subjective:     Patient ID: Joanna Sandoval , female    DOB: May 14, 1945 , 76 y.o.   MRN: [id number]   Chief Complaint  Patient presents with   Diabetes   Hypertension    HPI  Patient is here today for diabetes and hypertension check. Patient states that she is depressed.  She is not driving. Where she is living [employer]), she can only have a bird or fish. When she is around the dog she is not depressed. She lives alone. When she is around the dog she did not want to leave. She does not have any outside activity other than church. She states that she feels that she would be better if she had a dog. Her son is willing to pay for the food and the care of the dog. She is requesting a letter for support animal. She seen Dr. Terrence Dupont last Monday and everything looks good. She has been to ER on 8/16 for leg discomfort was concerned had a blood clot, ultrasound was negative for DVT.   She is unable to get medicaid due to making too much money. She is unable to go to the dentist due to not getting any help. She is trying to get in touch with [facility] dentistry school. She would like to go back to work because she is not able to support herself like when she was working.   When she drives she is shaking more.   Request sent for eye exam.   Diabetes She presents for her follow-up diabetic visit. She has type 2 diabetes mellitus. Her disease course has been worsening. Pertinent negatives for hypoglycemia include no dizziness or headaches. Pertinent negatives for diabetes include no chest pain, no fatigue, no polydipsia, no polyphagia and no polyuria. Symptoms are resolved. There are no diabetic complications. Risk factors for coronary artery disease include obesity, hypertension, diabetes  mellitus, dyslipidemia, sedentary lifestyle and post-menopausal. Current diabetic treatment includes oral agent (dual therapy). She is compliant with treatment most of the time. Her weight is stable. She is following a generally unhealthy diet. When asked about meal planning, she reported none. She has not had a previous visit with a dietitian. She participates in exercise every other day. Her home blood glucose trend is decreasing steadily. She does not see a podiatrist.Eye exam is current (done 09/22/2019 neg retinopathy).  Hypertension This is a chronic problem. The current episode started more than 1 year ago. The problem has been gradually improving since onset. The problem is controlled. Pertinent negatives include no chest pain, headaches or palpitations. There are no associated agents to hypertension. Risk factors for coronary artery disease include obesity, post-menopausal state, sedentary lifestyle and diabetes mellitus. The current treatment provides significant improvement. Compliance problems include exercise.  There is no history of angina or kidney disease. There is no history of chronic renal disease or sleep apnea.     Past Medical History:  Diagnosis Date   Arthritis    Diabetes mellitus    Hypercholesteremia    Hypertension    Post-menopausal bleeding    Postmenopausal bleeding 08/30/2011   Normal EBX 09/2011.     Family History  Problem Relation Age of Onset   Hypertension Mother    Hypertension Father    Cancer Father        ?  lung   Diabetes Sister      Current Outpatient Medications:    Accu-Chek Softclix Lancets lancets, USE AS DIRECTED, Disp: 200 each, Rfl: 3   albuterol (PROVENTIL HFA;VENTOLIN HFA) 108 (90 Base) MCG/ACT inhaler, Inhale 2 puffs into the lungs every 6 (six) hours as needed., Disp: , Rfl:    atenolol (TENORMIN) 100 MG tablet, Take 50 mg by mouth daily. , Disp: , Rfl:    atorvastatin (LIPITOR) 20 MG tablet, Take 1 tablet (20 mg total) by mouth daily.,  Disp: 90 tablet, Rfl: 1   Blood Glucose Monitoring Suppl (ACCU-CHEK GUIDE) w/Device KIT, USE TO CHECK BLOOD SUGAR  TWICE DAILY AT 10AM AND 5PM, Disp: 1 kit, Rfl: 1   carboxymethylcellulose (REFRESH PLUS) 0.5 % SOLN, 1 drop 3 (three) times daily as needed., Disp: , Rfl:    D3-50 50000 units capsule, , Disp: , Rfl: 0   dapagliflozin propanediol (FARXIGA) 10 MG TABS tablet, Take 1 tablet (10 mg total) by mouth daily before breakfast., Disp: 90 tablet, Rfl: 1   diclofenac sodium (VOLTAREN) 1 % GEL, Apply 2 g topically 4 (four) times daily., Disp: 100 g, Rfl: 1   glimepiride (AMARYL) 2 MG tablet, TAKE 1 TABLET BY MOUTH IN  THE MORNING AND AT BEDTIME, Disp: 180 tablet, Rfl: 3   glucose blood (ACCU-CHEK GUIDE) test strip, Use as instructed, Disp: 100 each, Rfl: 12   [name] KIT, Use to test blood sugar twice daily as directed. E11.65, Disp: 1 kit, Rfl: 12   latanoprost (XALATAN) 0.005 % ophthalmic solution, 1 drop at bedtime., Disp: , Rfl:    losartan (COZAAR) 50 MG tablet, Take 50 mg by mouth daily., Disp: , Rfl:    Magnesium 200 MG TABS, Take 1 tab by mouth daily with evening meal, Disp: 30 tablet, Rfl: 2   naproxen (NAPROSYN) 375 MG tablet, Take 1 tablet (375 mg total) by mouth 2 (two) times daily., Disp: 20 tablet, Rfl: 0   ondansetron (ZOFRAN ODT) 4 MG disintegrating tablet, Take 1 tablet (4 mg total) by mouth every 8 (eight) hours as needed for nausea or vomiting., Disp: 20 tablet, Rfl: 0   RYBELSUS 7 MG TABS, Take 1 tablet by mouth daily., Disp: 90 tablet, Rfl: 1   No Known Allergies   Review of Systems  Constitutional: Negative.  Negative for fatigue.  Respiratory: Negative.    Cardiovascular: Negative.  Negative for chest pain, palpitations and leg swelling.  Gastrointestinal: Negative.   Endocrine: Negative for polydipsia, polyphagia and polyuria.  Neurological: Negative.  Negative for dizziness and headaches.  Psychiatric/Behavioral: Negative.        Today's Vitals   12/12/21 0901  BP: 130/80  Pulse: 91  Temp: 98.1 F (36.7 C)  TempSrc: Oral  Weight: 198 lb (89.8 kg)  Height: $Remove'5\' 1"'CoKgAUo$  (1.549 m)   Body mass index is 37.41 kg/m.  Wt Readings from Last 3 Encounters:  12/12/21 198 lb (89.8 kg)  09/08/21 204 lb 9.6 oz (92.8 kg)  08/09/21 205 lb (93 kg)    Objective:  Physical Exam Vitals reviewed.  Constitutional:      General: She is not in acute distress.    Appearance: Normal appearance. She is obese.  HENT:     Head: Normocephalic.  Eyes:     Extraocular Movements: Extraocular movements intact.     Conjunctiva/sclera: Conjunctivae normal.     Pupils: Pupils are equal, round, and reactive to light.  Cardiovascular:     Rate and  Rhythm: Normal rate and regular rhythm.     Pulses: Normal pulses.     Heart sounds: Normal heart sounds. No murmur heard. Pulmonary:     Effort: Pulmonary effort is normal. No respiratory distress.     Breath sounds: Normal breath sounds. No wheezing.  Chest:     Chest wall: No mass.  Abdominal:     General: Abdomen is flat. Bowel sounds are normal. There is no distension.     Palpations: Abdomen is soft. There is no mass.     Tenderness: There is no abdominal tenderness.  Musculoskeletal:     Comments: Using a cane for support  Skin:    General: Skin is warm and dry.     Capillary Refill: Capillary refill takes less than 2 seconds.  Neurological:     General: No focal deficit present.     Mental Status: She is alert and oriented to person, place, and time.     Cranial Nerves: No cranial nerve deficit.     Motor: No weakness.  Psychiatric:        Mood and Affect: Mood normal.        Behavior: Behavior normal.        Thought Content: Thought content normal.        Judgment: Judgment normal.       Assessment And Plan:     1. Type 2 diabetes mellitus without complication, without long-term current use of insulin (HCC) Comments: HgbA1c is slightly better at last visit, continue  current medications.  - Hemoglobin A1c  2. Essential hypertension Comments: Blood pressure is fairly controlled, continue current medications.  - BMP8+EGFR  3. Aortic atherosclerosis (HCC) Comments: Continue statin, tolerating well  4. Vitamin D deficiency Will check vitamin D level and supplement as needed.    Also encouraged to spend 15 minutes in the sun daily.   5. Reactive depression Comments: Depression screen is 7, according to her SDOH she is moderately isolated. My concern about her having a dog is her safety due to age and risk for fall  6. Class 2 obesity due to excess calories with body mass index (BMI) of 37.0 to 37.9 in adult, unspecified whether serious comorbidity present She is encouraged to strive for BMI less than 30 to decrease cardiac risk. Advised to aim for at least 150 minutes of exercise per week.   Patient was given opportunity to ask questions. Patient verbalized understanding of the plan and was able to repeat key elements of the plan. All questions were answered to their satisfaction.  [name], FNP   I, [name], FNP, have reviewed all documentation for this visit. The documentation on 12/12/21 for the exam, diagnosis, procedures, and orders are all accurate and complete.   IF YOU HAVE BEEN REFERRED TO A SPECIALIST, IT MAY TAKE 1-2 WEEKS TO SCHEDULE/PROCESS THE REFERRAL. IF YOU HAVE NOT HEARD FROM US/SPECIALIST IN TWO WEEKS, PLEASE GIVE [place] A CALL AT [phone] X [phone].   THE PATIENT IS ENCOURAGED TO PRACTICE SOCIAL DISTANCING DUE TO THE COVID-19 PANDEMIC.

## 2021-12-13 LAB — BMP8+EGFR
BUN/Creatinine Ratio: 13 (ref 12–28)
BUN: 14 mg/dL (ref 8–27)
CO2: 27 mmol/L (ref 20–29)
Calcium: 9.9 mg/dL (ref 8.7–10.3)
Chloride: 104 mmol/L (ref 96–106)
Creatinine, Ser: 1.04 mg/dL — ABNORMAL HIGH (ref 0.57–1.00)
Glucose: 89 mg/dL (ref 70–99)
Potassium: 4.3 mmol/L (ref 3.5–5.2)
Sodium: 143 mmol/L (ref 134–144)
eGFR: 56 mL/min/{1.73_m2} — ABNORMAL LOW (ref >59)

## 2021-12-13 LAB — HEMOGLOBIN A1C
Est. average glucose Bld gHb Est-mCnc: 160 mg/dL
Hgb A1c MFr Bld: 7.2 % — ABNORMAL HIGH (ref 4.8–5.6)

## 2021-12-14 ENCOUNTER — Ambulatory Visit: Payer: Self-pay

## 2021-12-14 NOTE — Patient Outreach (Addendum)
  Care Coordination   Follow Up Visit Note   12/14/2021 Name: Azora Bonzo MRN: [id number] DOB: [birth year]  Mahayla Haddaway is a 76 y.o. year old female who sees [name], [place] for primary care. I spoke with  [name] by phone today.  What matters to the patients health and wellness today?  Patient is experiencing some depression.     Goals Addressed     Patient Stated     I am having some depression (pt-stated)        Care Coordination Interventions: Evaluation of current treatment plan related to depression and patient's adherence to plan as established by provider Advised patient to discuss new or worsening symptoms of depression  with provider Encouraged patient to use prayer, meditation and family support to help her express emotional distress Educated patient regarding availability of LCSW support, referral sent to Christa See LCSW   SDOH assessments and interventions completed:  Yes  SDOH Interventions Today    Flowsheet Row Most Recent Value  SDOH Interventions   Transportation Interventions Other (Comment)  [SW referral sent]        Care Coordination Interventions Activated:  Yes  Care Coordination Interventions:  Yes, provided   Follow up plan: Referral made to Christa See LCSW Follow up call scheduled for 02/13/22 '@09'$ :30 AM    Encounter Outcome:  Pt. Visit Completed

## 2021-12-14 NOTE — Patient Instructions (Signed)
Visit Information  Thank you for taking time to visit with me today. Please don't hesitate to contact me if I can be of assistance to you.   Following are the goals we discussed today:   Goals Addressed     Patient Stated     I am having some depression (pt-stated)        Care Coordination Interventions: Evaluation of current treatment plan related to depression and patient's adherence to plan as established by provider Advised patient to discuss new or worsening symptoms of depression  with provider Encouraged patient to use prayer, meditation and family support to help her express emotional distress Educated patient regarding availability of LCSW support, referral sent to [place] next appointment is by telephone on 02/13/22 at 09:30 AM   Please call the care guide team at [phone] if you need to cancel or reschedule your appointment.   If you are experiencing a Mental Health or [place] or need someone to talk to, please call 1-800-273-TALK (toll free, 24 hour hotline)  Patient verbalizes understanding of instructions and care plan provided today and agrees to view in [place]. Active MyChart status and patient understanding of how to access instructions and care plan via MyChart confirmed with patient.     [name], RN, BSN, CCM Care Management Coordinator [facility] Care Management  Direct Phone: [phone]

## 2021-12-15 ENCOUNTER — Encounter: Payer: Self-pay | Admitting: Nurse Practitioner

## 2021-12-15 ENCOUNTER — Ambulatory Visit: Payer: Self-pay

## 2021-12-15 NOTE — Patient Instructions (Signed)
Visit Information  Thank you for taking time to visit with me today. Please don't hesitate to contact me if I can be of assistance to you.   Following are the goals we discussed today:   Goals Addressed             This Visit's Progress    I would like to have SCAT       Care Coordination Interventions: Discussed patient is still currently driving but no longer feels safe stating her legs and hands shake Initiated SCAT application, sent to provider for completion Discussed patient is becoming lonely in the home  Educated on local senior centers and options for engagement Added patient to the mailing list for [place] for patient to get more involved with programming        Please call the care guide team at [phone] if you need to schedule an appointment with me  If you are experiencing a Mental Health or [place] or need someone to talk to, please go to [facility] [address], [place] [phone])  Patient verbalizes understanding of instructions and care plan provided today and agrees to view in [place]. Active MyChart status and patient understanding of how to access instructions and care plan via MyChart confirmed with patient.     The care management team will reach out to the patient again over the next 30 days.   [name], BSW, CDP Social Worker, Certified Dementia Practitioner Care Coordination [phone]

## 2021-12-15 NOTE — Patient Outreach (Signed)
  Care Coordination   Follow Up Visit Note   12/15/2021 Name: Joanna Sandoval MRN: [id number] DOB: Oct 06, 1945  Joanna Sandoval is a 76 y.o. year old female who sees [name], [place] for primary care. I spoke with  Joanna Sandoval by phone today.  What matters to the patients health and wellness today?  I need options for transportation    Goals Addressed             This Visit's Progress    I would like to have SCAT       Care Coordination Interventions: Discussed patient is still currently driving but no longer feels safe stating her legs and hands shake Initiated SCAT application, sent to provider for completion Discussed patient is becoming lonely in the home  Educated on local senior centers and options for engagement Added patient to the mailing list for [facility] for patient to get more involved with programming        SDOH assessments and interventions completed:  No     Care Coordination Interventions Activated:  Yes  Care Coordination Interventions:  Yes, provided   Follow up plan:  SW will continue to follow and submit application once completed by provider    Encounter Outcome:  Pt. Visit Completed   Joanna Sandoval, BSW, CDP Social Worker, Certified Dementia Practitioner Care Coordination [phone]

## 2021-12-26 ENCOUNTER — Telehealth: Payer: Self-pay

## 2021-12-26 NOTE — Chronic Care Management (AMB) (Signed)
   [name] was reminded to have all medications, supplements and any blood glucose and blood pressure readings available for review with [name], Pharm. D, at her telephone visit on 12-28-2021 at 10:00.   Questions: Have you had any recent office visit or specialist visit outside of [place]? Patient stated no  Are there any concerns you would like to discuss during your office visit? Patient stated no  Are you having any problems obtaining your medications? (Whether it pharmacy issues or cost). Patient stated no  If patient has any PAP medications ask if they are having any problems getting their PAP medication or refill? No PAP medications  Care Gaps: Yearly ophthalmology overdue Flu vaccine overdue Covid booster overdue  Star Rating Drug: Atorvastatin 40 mg- Last filled 10-23-2021 [facility]  Rybelsus 7 mg- Last filled 12-21-2021 [facility].  Losartan 50 mg- Last filled 10-23-2021 [facility].  Glimepiride 2 mg- Last filled 10-23-2021 [facility]. Previous filled 07-28-2021 90 [facility] Farxiga 10 mg- Last filled 11-02-2021 [facility].   Any gaps in medications fill history? No   [facility] Pharmacist Assistant [phone]

## 2021-12-27 ENCOUNTER — Ambulatory Visit: Payer: Self-pay

## 2021-12-27 NOTE — Patient Instructions (Signed)
Visit Information  Thank you for taking time to visit with me today. Please don't hesitate to contact me if I can be of assistance to you.   Following are the goals we discussed today:   Goals Addressed             This Visit's Progress    COMPLETED: I would like to have SCAT       Care Coordination Interventions: Received completed SCAT application from patients provider, submitted to [employer] B[employer] Telephonic visit completed with the patient who confirms she did receive information from [facility] [place] and is interested in attending future activities once her SCAT is approved Advised the patient she will receive information in the mail regarding rider eligibility once her application is processed Discussed the patient has yet to speak with her landlord about the possibility of owning a dog but does plan to care for her sons teacup poodle in her home while he and his wife go out of town in the next few weeks Patient plans to pet sit at times as she enjoys spending time with her sons dog Encouraged patient to contact SW as needed        Please call the care guide team at [phone] if you need to schedule an appointment with me.  If you are experiencing a Mental Health or [place] or need someone to talk to, please call 1-800-273-TALK (toll free, 24 hour hotline)  Patient verbalizes understanding of instructions and care plan provided today and agrees to view in [place]. Active MyChart status and patient understanding of how to access instructions and care plan via MyChart confirmed with patient.     No further follow up required: Please contact me as needed  [name], BSW, CDP Social Worker, Certified Dementia Practitioner Care Coordination [phone]

## 2021-12-27 NOTE — Patient Outreach (Signed)
  Care Coordination   Follow Up Visit Note   12/27/2021 Name: Joanna Sandoval MRN: [id number] DOB: 1945/04/21  Joanna Sandoval is a 76 y.o. year old female who sees [name], [place] for primary care. I spoke with  Joanna Sandoval by phone today.  What matters to the patients health and wellness today?  I would like to go to the senior center for activities but I need transportation to get there   Goals Addressed             This Visit's Progress    COMPLETED: I would like to have [place] Interventions: Received completed SCAT application from patients provider, submitted to [employer] B[employer] Telephonic visit completed with the patient who confirms she did receive information from [facility] and is interested in attending future activities once her SCAT is approved Advised the patient she will receive information in the mail regarding rider eligibility once her application is processed Discussed the patient has yet to speak with her landlord about the possibility of owning a dog but does plan to care for her sons teacup poodle in her home while he and his wife go out of town in the next few weeks Patient plans to pet sit at times as she enjoys spending time with her sons dog Encouraged patient to contact SW as needed        SDOH assessments and interventions completed:  No     Care Coordination Interventions Activated:  Yes  Care Coordination Interventions:  Yes, provided   Follow up plan: No further intervention required.   Encounter Outcome:  Pt. Visit Completed   [name], BSW, CDP Social Worker, Certified Dementia Practitioner Care Coordination [phone]

## 2021-12-28 ENCOUNTER — Ambulatory Visit: Payer: Medicare Other

## 2021-12-28 DIAGNOSIS — E119 Type 2 diabetes mellitus without complications: Secondary | ICD-10-CM

## 2021-12-28 DIAGNOSIS — I10 Essential (primary) hypertension: Secondary | ICD-10-CM

## 2021-12-28 NOTE — Patient Instructions (Signed)
Visit Information It was great speaking with you today!  Please let me know if you have any questions about our visit.   Goals Addressed             This Visit's Progress    Manage My Medicine       Timeframe:  Long-Range Goal Priority:  Medium Start Date:                             Expected End Date:                       Follow Up Date: 03/14/2022  In Progress:   - call for medicine refill 2 or 3 days before it runs out - call if I am sick and can't take my medicine - keep a list of all the medicines I take; vitamins and herbals too - use a pillbox to sort medicine    Why is this important?   These steps will help you keep on track with your medicines.   Notes:  Please call if you have any questions.        Patient Care Plan: CCM Pharmacy Care Plan     Problem Identified: DM, HTN   Priority: High     Long-Range Goal: Disease Management   Recent Progress: On track  Priority: High  Note:   Current Barriers:  Unable to independently monitor therapeutic efficacy Does not maintain contact with provider office  Pharmacist Clinical Goal(s):  Patient will achieve adherence to monitoring guidelines and medication adherence to achieve therapeutic efficacy through collaboration with PharmD and provider.   Interventions: 1:1 collaboration with [name], FNP regarding development and update of comprehensive plan of care as evidenced by provider attestation and co-signature Inter-disciplinary care team collaboration (see longitudinal plan of care) Comprehensive medication review performed; medication list updated in electronic medical record  Hypertension (BP goal <140/90) -Controlled -Current treatment: Atenolol 100 mg tablet - take 1/2 tablet daily Appropriate, Effective, Safe, Accessible Losartan 50 mg tablet daily Appropriate, Effective, Safe, Accessible -Current home readings: she does not currently have a BP cuff at home  -Denies hypotensive/hypertensive  symptoms -Educated on Daily salt intake goal < 2300 mg; Exercise goal of 150 minutes per week; Importance of home blood pressure monitoring; -Counseled to monitor BP at home at least three times per week, document, and provide log at future appointments -Recommended to continue current medication   Diabetes (A1c goal <8%) -Controlled -Current medications: Farxiga 10 mg tablet once per day Appropriate, Effective, Safe, Accessible Glimepiride 2 mg tablet twice per day Appropriate, Effective, Safe, Accessible Rybelsus 7 mg tablet once per day. Appropriate, Effective, Safe, Accessible -Current home glucose readings fasting glucose: 120, 118  -Denies hypoglycemic/hyperglycemic symptoms -Current meal patterns: patient reports that she is eating a lot of snacks. Her grand daughter goes with her to go shopping and she is trying to be more healthy.  drinks: she loves iced tea but she does not drink it the way she likes  -Educated on A1c and blood sugar goals; Complications of diabetes including kidney damage, retinal damage, and cardiovascular disease; -Counseled to check feet daily and get yearly eye exams -Patient reports that she is going to get SCAT transportation so she will be able to get her eye exam complete -Recommended to continue current medication  Patient Goals/Self-Care Activities Patient will:  - take medications as prescribed as evidenced by patient report and  record review -Engage with [facility] to help with the purchase of her pull ups   Follow Up Plan: The patient has been provided with contact information for the care management team and has been advised to call with any health related questions or concerns.        Patient agreed to services and verbal consent obtained.   The patient verbalized understanding of instructions, educational materials, and care plan provided today and agreed to receive a mailed copy of patient instructions, educational materials, and care plan.    [name], PharmD Clinical Pharmacist [facility] [phone]

## 2021-12-28 NOTE — Progress Notes (Signed)
Chronic Care Management Pharmacy Note  12/28/2021 Name:  Joanna Sandoval MRN:  [id number] DOB:  Oct 07, 1945  Summary: Patient reports that she is monitoring her eating habits more closely   Recommendations/Changes made from today's visit: Recommend patient increase the amount of water she is drinking.  Recommend patient receive COVID-19 Booster, and Influenza vaccine   Plan: Patient to increase the amount of vegetables she is eating.  HC to contact patient about helping her with ordering depends    Subjective: Joanna Sandoval is an 76 y.o. year old female who is 76 a primary patient of [name], [place].  The CCM team was consulted for assistance with disease management and care coordination needs.    Engaged with patient by telephone for follow up visit in response to provider referral for pharmacy case management and/or care coordination services.   Consent to Services:  The patient was given information about Chronic Care Management services, agreed to services, and gave verbal consent prior to initiation of services.  Please see initial visit note for detailed documentation.   Patient Care Team: [name], FNP as PCP - General ([place]) Rex Kras, Claudette Stapler, RN as Case Manager Aleksandra Raben, Sharyn Blitz, [facility] (Pharmacist) [facility]  Recent office visits: 12/12/2021 PCP OV  Recent consult visits: 05/30/2021 Cardiology [facility] visits: 11/30/2021 ED Visit  09/25/2021 ED Visit    Objective:  Lab Results  Component Value Date   CREATININE 1.04 (H) 12/12/2021   BUN 14 12/12/2021   EGFR 56 (L) 12/12/2021   GFRNONAA >60 11/29/2020   GFRAA 70 01/29/2020   NA 143 12/12/2021   K 4.3 12/12/2021   CALCIUM 9.9 12/12/2021   CO2 27 12/12/2021   GLUCOSE 89 12/12/2021    Lab Results  Component Value Date/Time   HGBA1C 7.2 (H) 12/12/2021 10:21 AM   HGBA1C 7.5 (H) 08/09/2021 10:18 AM   MICROALBUR 30 08/04/2020 09:46 AM   MICROALBUR 30 07/30/2019 10:58  AM    Last diabetic Eye exam:  Lab Results  Component Value Date/Time   HMDIABEYEEXA No Retinopathy 10/12/2020 12:00 AM    Last diabetic Foot exam: No results found for: "HMDIABFOOTEX"   Lab Results  Component Value Date   CHOL 145 08/09/2021   HDL 52 08/09/2021   LDLCALC 73 08/09/2021   TRIG 108 08/09/2021   CHOLHDL 2.8 08/09/2021       Latest Ref Rng & Units 08/09/2021   10:18 AM 05/23/2021    9:49 AM 02/14/2021   11:17 AM  Hepatic Function  Total Protein 6.0 - 8.5 g/dL 7.6  7.0  7.3   Albumin 3.7 - 4.7 g/dL 4.3  4.2  4.3   AST 0 - 40 IU/L _0 ALT 0 - 32 IU/L _1 Alk Phosphatase 44 - 121 IU/L 130  138  122   Total Bilirubin 0.0 - 1.2 mg/dL 0.5  0.6  0.5     Lab Results  Component Value Date/Time   TSH 0.721 01/29/2020 09:35 AM   TSH 0.490 07/30/2019 10:59 AM       Latest Ref Rng & Units 08/09/2021   10:18 AM 12/23/2020   12:47 PM 12/01/2020    4:55 PM  CBC  WBC 3.4 - 10.8 x10E3/uL 12.7  13.3  10.2   Hemoglobin 11.1 - 15.9 g/dL 13.6  12.8  9.7   Hematocrit 34.0 - 46.6 % 42.3  40.2  29.9   Platelets 150 -  450 x10E3/uL 302  268  450     Lab Results  Component Value Date/Time   VD25OH 41.2 11/08/2020 09:13 AM   VD25OH 27.7 (L) 08/04/2020 10:13 AM    Clinical ASCVD: Yes       12/12/2021    9:11 AM 12/12/2021    9:00 AM 09/08/2021    8:27 AM  Depression screen PHQ 2/9  Decreased Interest 0 0 0  Down, Depressed, Hopeless 3 0 0  PHQ - 2 Score 3 0 0  Altered sleeping 1    Tired, decreased energy 1    Change in appetite 0    Feeling bad or failure about yourself  0    Trouble concentrating 1    Moving slowly or fidgety/restless 1    Suicidal thoughts 0    PHQ-9 Score 7        Social History   Tobacco Use  Smoking Status Former   Types: Cigarettes  Smokeless Tobacco Never   BP Readings from Last 3 Encounters:  12/12/21 130/80  11/30/21 (!) 148/77  09/25/21 118/78   Pulse Readings from Last 3 Encounters:  12/12/21 91  11/30/21  92  09/25/21 98   Wt Readings from Last 3 Encounters:  12/12/21 198 lb (89.8 kg)  09/08/21 204 lb 9.6 oz (92.8 kg)  08/09/21 205 lb (93 kg)   BMI Readings from Last 3 Encounters:  12/12/21 37.41 kg/m  09/08/21 38.66 kg/m  08/09/21 37.49 kg/m    Assessment/Interventions: Review of patient past medical history, allergies, medications, health status, including review of consultants reports, laboratory and other test data, was performed as part of comprehensive evaluation and provision of chronic care management services.   SDOH:  (Social Determinants of Health) assessments and interventions performed: Yes SDOH Interventions    Flowsheet Row Care Coordination from 12/14/2021 in [place] from 09/08/2021 in [facility] Chronic Care Management from 08/16/2021 in [facility] Chronic Care Management from 02/16/2021 in [facility] Chronic Care Management from 12/28/2020 in [facility] Office Visit from 07/30/2019 in [facility]  SDOH Interventions        Food Insecurity Interventions -- -- Other (Comment)  [provided patient with a list of food pantry sites. Pt already receiving FNS] -- -- --  Housing Interventions -- -- Intervention Not Indicated Intervention Not Indicated -- --  Transportation Interventions Other (Comment)  [SW referral sent] -- Intervention Not Indicated Intervention Not Indicated -- --  Depression Interventions/Treatment  -- -- -- -- -- --  [she is feeling down related to not being able to be as social]  Financial Strain Interventions -- --  Jeannene Patella to care guide team] Other (Comment)  [Resources provided for food pantry and dental clinics. Discussed opportunity to apply for Medicaid- app provided] -- Other (Comment) --  Physical Activity Interventions -- -- -- Other (Comments)  [Educated patient on  recommendations for 150 minutes weekly] -- --  Social Connections Interventions -- -- -- Intervention Not Indicated -- --      SDOH Screenings   Food Insecurity: No Food Insecurity (09/08/2021)  Recent Concern: [place] Present (08/16/2021)  Housing: Low Risk  (08/16/2021)  Transportation Needs: Unmet Transportation Needs (12/14/2021)  Alcohol Screen: Low Risk  (02/16/2021)  Depression (PHQ2-9): Medium Risk (12/12/2021)  Financial Resource Strain: High Risk (09/08/2021)  Physical Activity: Sufficiently Active (09/08/2021)  Social Connections: Moderately Isolated (02/16/2021)  Stress: No Stress  Concern Present (09/08/2021)  Tobacco Use: Medium Risk (12/12/2021)    CCM Care Plan  No Known Allergies  Medications Reviewed Today     Reviewed by Mayford Knife, RPH (Pharmacist) on 12/28/21 at 1040  Med List Status: <None>   Medication Order Taking? Sig Documenting Provider Last Dose Status Informant  Accu-Chek Softclix Lancets lancets [id number]  USE AS DIRECTED [name], FNP  Active   albuterol (PROVENTIL HFA;VENTOLIN HFA) 108 (90 Base) MCG/ACT inhaler [id number] No Inhale 2 puffs into the lungs every 6 (six) hours as needed. [provider] Taking Active   atenolol (TENORMIN) 100 MG tablet [id number] No Take 50 mg by mouth daily.  [provider] Taking Active   atorvastatin (LIPITOR) 20 MG tablet [id number] No Take 1 tablet (20 mg total) by mouth daily. [name], FNP Taking Expired 12/01/21 2359   Blood Glucose Monitoring Suppl (ACCU-CHEK GUIDE) w/Device KIT [id number] No USE TO CHECK BLOOD SUGAR  TWICE DAILY AT 10AM AND 5PM [name], FNP Taking Active   carboxymethylcellulose (REFRESH PLUS) 0.5 % SOLN [id number] No 1 drop 3 (three) times daily as needed. [provider] Taking Active Self  D3-50 50000 units capsule [id number] No  [provider] Taking Active   dapagliflozin propanediol (FARXIGA) 10 MG TABS tablet [id number]   Take 1 tablet (10 mg total) by mouth daily before breakfast. [name], FNP  Active   diclofenac sodium (VOLTAREN) 1 % GEL [id number] No Apply 2 g topically 4 (four) times daily. [name], FNP Taking Active   glimepiride (AMARYL) 2 MG tablet [id number] No TAKE 1 TABLET BY MOUTH IN  THE MORNING AND AT BEDTIME [name], FNP Taking Active   glucose blood (ACCU-CHEK GUIDE) test strip [id number] No Use as instructed [name], FNP Taking Active   [name] KIT [id number] No Use to test blood sugar twice daily as directed. E11.65 [name], FNP Taking Active   latanoprost (XALATAN) 0.005 % ophthalmic solution [id number] No 1 drop at bedtime. [provider] Taking Active   losartan (COZAAR) 50 MG tablet [id number] No Take 50 mg by mouth daily. [provider] Taking Active   Magnesium 200 MG TABS [id number] No Take 1 tab by mouth daily with evening meal [name], FNP Taking Active   naproxen (NAPROSYN) 375 MG tablet [id number]  Take 1 tablet (375 mg total) by mouth 2 (two) times daily. Hans Eden, NP  Active   ondansetron (ZOFRAN ODT) 4 MG disintegrating tablet [id number] No Take 1 tablet (4 mg total) by mouth every 8 (eight) hours as needed for nausea or vomiting. Eustaquio Maize, PA-C Taking Active   RYBELSUS 7 MG TABS [id number] No Take 1 tablet by mouth daily. [name], FNP Taking Active             Patient Active Problem List   Diagnosis Date Noted   Colon cancer screening 05/05/2020   Morbid obesity ([place]) 05/05/2020   Type 2 diabetes mellitus without complication, without long-term current use of insulin ([place]) 07/31/2018   Essential hypertension 04/30/2018   Fibroid 11/11/2011    Immunization History  Administered Date(s) Administered   Fluad Quad(high Dose 65+) 01/29/2020, 02/01/2021   Influenza, High Dose Seasonal PF 01/16/2018   Influenza,inj,Quad PF,6+ Mos 01/12/2019   PFIZER Comirnaty(Gray Top)Covid-19  Tri-Sucrose Vaccine 10/10/2021   PFIZER(Purple Top)SARS-COV-2 Vaccination 05/25/2019, 06/18/2019, 02/14/2020, 09/18/2020   PNEUMOCOCCAL CONJUGATE-20 08/09/2021   Pneumococcal Polysaccharide-23 08/04/2020   Tdap 10/06/2018   Zoster Recombinat (Shingrix) 02/08/2021,  04/23/2021    Conditions to be addressed/monitored:  Hypertension and Diabetes  Care Plan : [place]  Updates made by Mayford Knife, RPH since 12/28/2021 12:00 AM     Problem: DM, HLD   Priority: High     Long-Range Goal: Disease Management   Recent Progress: On track  Priority: High  Note:   Current Barriers:  Unable to independently monitor therapeutic efficacy Does not maintain contact with provider office  Pharmacist Clinical Goal(s):  Patient will achieve adherence to monitoring guidelines and medication adherence to achieve therapeutic efficacy through collaboration with PharmD and provider.   Interventions: 1:1 collaboration with [name], FNP regarding development and update of comprehensive plan of care as evidenced by provider attestation and co-signature Inter-disciplinary care team collaboration (see longitudinal plan of care) Comprehensive medication review performed; medication list updated in electronic medical record  Hypertension (BP goal <140/90) -Controlled -Current treatment: Atenolol 100 mg tablet - take 1/2 tablet daily Appropriate, Effective, Safe, Accessible Losartan 50 mg tablet daily Appropriate, Effective, Safe, Accessible -Current home readings: she does not currently have a BP cuff at home  -Denies hypotensive/hypertensive symptoms -Educated on Daily salt intake goal < 2300 mg; Exercise goal of 150 minutes per week; Importance of home blood pressure monitoring; -Counseled to monitor BP at home at least three times per week, document, and provide log at future appointments -Recommended to continue current medication   Diabetes (A1c goal  <8%) -Controlled -Current medications: Farxiga 10 mg tablet once per day Appropriate, Effective, Safe, Accessible Glimepiride 2 mg tablet twice per day Appropriate, Effective, Safe, Accessible Rybelsus 7 mg tablet once per day. Appropriate, Effective, Safe, Accessible -Current home glucose readings fasting glucose: 120, 118  -Denies hypoglycemic/hyperglycemic symptoms -Current meal patterns: patient reports that she is eating a lot of snacks. Her grand daughter goes with her to go shopping and she is trying to be more healthy.  drinks: she loves iced tea but she does not drink it the way she likes  -Educated on A1c and blood sugar goals; Complications of diabetes including kidney damage, retinal damage, and cardiovascular disease; -Counseled to check feet daily and get yearly eye exams -Patient reports that she is going to get SCAT transportation so she will be able to get her eye exam complete -Recommended to continue current medication  Patient Goals/Self-Care Activities Patient will:  - take medications as prescribed as evidenced by patient report and record review -Engage with [facility] to help with the purchase of her pull ups   Follow Up Plan: The patient has been provided with contact information for the care management team and has been advised to call with any health related questions or concerns.       Medication Assistance: None required.  Patient affirms current coverage meets needs.  Compliance/Adherence/Medication fill history: Care Gaps: Ophthalmology Exam - discussed the importance of patient having eye exam completed  Influenza Vaccine COVID-19 Booster  Star-Rating Drugs: Atorvastatin 20 mg tablet  Farxiga 10 mg tablet  Glimepiride 2 mg tablet  Losartan 50 mg tablet  Rybelsus 7 mg tablet   Patient's preferred pharmacy is:  [facility], [address] AT [address] [place]  [address] Phone: [phone] Fax: [phone]  [facility] - [place], [place] [facility] [address] [place] [address] [place] [address] Phone: [phone] Fax: [place] [facility] (  [facility]) - [place], [place] [address] Phone: [phone] Fax: [phone]  Uses pill box? Yes Pt endorses 90% compliance  We discussed: Benefits of medication synchronization, packaging and delivery as well as enhanced pharmacist oversight with [employer]. Patient decided to: Continue current medication management strategy  Care Plan and Follow Up Patient Decision:  Patient agrees to Care Plan and Follow-up.  Plan: The patient has been provided with contact information for the care management team and has been advised to call with any health related questions or concerns.   [name], CPP, PharmD Clinical Pharmacist Practitioner [facility] [phone]

## 2022-01-03 ENCOUNTER — Telehealth: Payer: Self-pay

## 2022-01-03 ENCOUNTER — Encounter: Payer: Self-pay | Admitting: Licensed Clinical Social Worker

## 2022-01-03 ENCOUNTER — Telehealth: Payer: Self-pay | Admitting: Licensed Clinical Social Worker

## 2022-01-03 NOTE — Chronic Care Management (AMB) (Signed)
Medical Supply Assistance:  Patient is needing assistance getting depends, per clinical team, medicare will no longer cover Depends through a durable medical supply store and from research there is  a location called [facility] that might be able to [place] to see if they would be able to help patient in getting some depends, I was transferred to the Senior Care line's voicemail. Left message with patient contact number for them to reach out and/or they could reach back out to me with further information on help.  Called patient to inform, no answer, left message regarding [facility] [place] and their number. Left my number also for patient to call me back if she has any more questions.  Pattricia Boss, [place] Pharmacist Assistant [phone]

## 2022-01-10 ENCOUNTER — Other Ambulatory Visit: Payer: Self-pay | Admitting: Nurse Practitioner

## 2022-01-10 ENCOUNTER — Ambulatory Visit: Payer: Self-pay | Admitting: Licensed Clinical Social Worker

## 2022-01-12 ENCOUNTER — Telehealth: Payer: Self-pay | Admitting: Licensed Clinical Social Worker

## 2022-01-12 NOTE — Patient Instructions (Signed)
Visit Information  Thank you for taking time to visit with me today. Please don't hesitate to contact me if I can be of assistance to you.   Following are the goals we discussed today:   Goals Addressed             This Visit's Progress    Obtain Supportive Resources   On track    Care Coordination Interventions: Mindfulness or Relaxation training provided Active listening / Reflection utilized  Emotional Support Provided Verbalization of feelings encouraged  Pt identified highlights of her weekend, including, watching son's dog  Discussed strategies of prioritizing her happiness Pt and LCSW reviewed SCAT application. LCSW answered questions to assist pt Pt received supportive information regarding senior centers/activities in the community LCSW informed pt that [place] application was submitted via email        Our next appointment is by telephone on 01/26/22 at 10:30 AM  Please call the care guide team at [phone] if you need to cancel or reschedule your appointment.   If you are experiencing a Mental Health or [place] or need someone to talk to, please call the Suicide and Crisis Lifeline: 988 call 911   Patient verbalizes understanding of instructions and care plan provided today and agrees to view in [place]. Active MyChart status and patient understanding of how to access instructions and care plan via MyChart confirmed with patient.     Christa See, MSW, [email or web address] Phone [phone] 10:45 AM

## 2022-01-12 NOTE — Patient Outreach (Signed)
  Care Coordination   Follow Up Visit Note   01/12/2022 Name: Joanna Sandoval MRN: [id number] DOB: 04-Dec-1945  Joanna Sandoval is a 76 y.o. year old female who sees [name], [place] for primary care. I spoke with  [name] by phone today.  What matters to the patients health and wellness today?  ACCESS [place] application    Goals Addressed             This Visit's Progress    Obtain Supportive Resources   On track    Care Coordination Interventions: Mindfulness or Relaxation training provided Active listening / Reflection utilized  Emotional Support Provided Verbalization of feelings encouraged  Pt identified highlights of her weekend, including, watching son's dog  Discussed strategies of prioritizing her happiness Pt and LCSW reviewed SCAT application. LCSW answered questions to assist pt Pt received supportive information regarding senior centers/activities in the community LCSW informed pt that [employer] [place] application was submitted via email        SDOH assessments and interventions completed:  No     Care Coordination Interventions Activated:  Yes  Care Coordination Interventions:  Yes, provided   Follow up plan: Follow up call scheduled for 2 weeks    Encounter Outcome:  Pt. Visit Completed   Christa See, MSW, [email or web address] Phone [phone] 10:44 AM

## 2022-01-13 NOTE — Patient Instructions (Signed)
Visit Information  Thank you for taking time to visit with me today. Please don't hesitate to contact me if I can be of assistance to you.   Following are the goals we discussed today:   Goals Addressed             This Visit's Progress    Obtain Supportive Resources       Care Coordination Interventions: Mindfulness or Relaxation training provided Active listening / Reflection utilized  Emotional Support Provided Verbalization of feelings encouraged  Pt identified highlights of her weekend, including, watching son's dog  Discussed strategies of prioritizing her happiness Pt and LCSW reviewed SCAT application. LCSW answered questions to assist pt Pt received supportive information regarding senior centers/activities in the community         Our next appointment is by telephone on 01/26/22   Please call the care guide team at [phone] if you need to cancel or reschedule your appointment.   If you are experiencing a Mental Health or [place] or need someone to talk to, please call the Suicide and Crisis Lifeline: 988 call 911   Patient verbalizes understanding of instructions and care plan provided today and agrees to view in [place]. Active MyChart status and patient understanding of how to access instructions and care plan via MyChart confirmed with patient.     Joanna Sandoval, MSW, [place]anna [email or web address] Phone [phone] 12:14 AM

## 2022-01-13 NOTE — Patient Outreach (Signed)
  Care Coordination   Initial Visit Note   01/13/2022 Name: Shaolin Armas MRN: [id number] DOB: 1946/03/31  Jaydeen Darley is a 76 y.o. year old female who sees [name], [place] for primary care. I spoke with  [name] by phone today.  What matters to the patients health and wellness today?  Psychologist, occupational   Goals Addressed             This Visit's Progress    Obtain Supportive Resources       Care Coordination Interventions: Mindfulness or Relaxation training provided Active listening / Reflection utilized  Emotional Support Provided Verbalization of feelings encouraged  Pt identified highlights of her weekend, including, watching son's dog  Discussed strategies of prioritizing her happiness Pt and LCSW reviewed SCAT application. LCSW answered questions to assist pt Pt received supportive information regarding senior centers/activities in the community         SDOH assessments and interventions completed:  No     Care Coordination Interventions Activated:  Yes  Care Coordination Interventions:  Yes, provided   Follow up plan: Follow up call scheduled for 01/26/22    Encounter Outcome:  Pt. Visit Completed   Christa See, MSW, [email or web address] Phone [phone] 12:14 AM

## 2022-01-13 NOTE — Patient Outreach (Signed)
  Care Coordination   Initial Visit Note   01/13/2022 Name: Joanna Sandoval MRN: [id number] DOB: [birth year]  Joanna Sandoval is a 76 y.o. year old female who sees [name], [place] for primary care. I spoke with  Joanna Sandoval by phone today.  What matters to the patients health and wellness today?  Care Coordination    Goals Addressed             This Visit's Progress    Obtain Supportive Resources   On track    Care Coordination Interventions: Mindfulness or Relaxation training provided Active listening / Reflection utilized  Emotional Support Provided Verbalization of feelings encouraged  LCSW introduced self and explained care coordination services. Pt reports interest and scheduled initial appt for 01/10/22        SDOH assessments and interventions completed:  No     Care Coordination Interventions Activated:  Yes  Care Coordination Interventions:  Yes, provided   Follow up plan: Follow up call scheduled for 01/10/22    Encounter Outcome:  Pt. Scheduled   Christa See, MSW, [email or web address] Phone [phone] 12:08 AM

## 2022-01-13 NOTE — Patient Instructions (Signed)
Visit Information  Thank you for taking time to visit with me today. Please don't hesitate to contact me if I can be of assistance to you.   Following are the goals we discussed today:   Goals Addressed             This Visit's Progress    Obtain Supportive Resources   On track    Care Coordination Interventions: Mindfulness or Relaxation training provided Active listening / Reflection utilized  Emotional Support Provided Verbalization of feelings encouraged  LCSW introduced self and explained care coordination services. Pt reports interest and scheduled initial appt for 01/10/22        Our next appointment is by telephone on 01/10/22   Please call the care guide team at [phone] if you need to cancel or reschedule your appointment.   If you are experiencing a Mental Health or [place] or need someone to talk to, please call the Suicide and Crisis Lifeline: 988 call 911   Patient verbalizes understanding of instructions and care plan provided today and agrees to view in [place]. Active MyChart status and patient understanding of how to access instructions and care plan via MyChart confirmed with patient.     Christa See, MSW, [email or web address] Phone [phone] 12:09 AM

## 2022-01-17 ENCOUNTER — Ambulatory Visit: Payer: Medicare Other

## 2022-01-19 ENCOUNTER — Ambulatory Visit (INDEPENDENT_AMBULATORY_CARE_PROVIDER_SITE_OTHER): Payer: Medicare Other

## 2022-01-19 VITALS — BP 120/64 | HR 85 | Temp 98.1°F | Ht 61.0 in | Wt 198.0 lb

## 2022-01-19 DIAGNOSIS — Z23 Encounter for immunization: Secondary | ICD-10-CM | POA: Diagnosis not present

## 2022-01-19 NOTE — Progress Notes (Signed)
Patient presents today for flu shot.  

## 2022-01-20 ENCOUNTER — Ambulatory Visit (HOSPITAL_COMMUNITY)
Admission: EM | Admit: 2022-01-20 | Discharge: 2022-01-20 | Disposition: A | Discharge status: Home / Self Care | Payer: Medicare Other | Attending: Nurse Practitioner | Admitting: Nurse Practitioner

## 2022-01-20 ENCOUNTER — Encounter (HOSPITAL_COMMUNITY): Payer: Self-pay | Admitting: Emergency Medicine

## 2022-01-20 DIAGNOSIS — R2241 Localized swelling, mass and lump, right lower limb: Secondary | ICD-10-CM | POA: Insufficient documentation

## 2022-01-20 DIAGNOSIS — M79674 Pain in right toe(s): Secondary | ICD-10-CM | POA: Insufficient documentation

## 2022-01-20 LAB — URIC ACID: Uric Acid, Serum: 4.4 mg/dL (ref 2.5–7.1)

## 2022-01-20 LAB — SEDIMENTATION RATE: Sed Rate: 35 mm/hr — ABNORMAL HIGH (ref 0–22)

## 2022-01-20 MED ORDER — CEPHALEXIN 500 MG PO CAPS: 500.0000 mg | ORAL_CAPSULE | Freq: Two times a day (BID) | ORAL | 0 refills | Status: AC

## 2022-01-20 NOTE — ED Triage Notes (Signed)
Pt reports swelling, redness and pain on the right great toe x 2 days Pain increases with walking.  Denies any obvious injuries to the toe.  Has been using epsom salt and ice/heat application.

## 2022-01-20 NOTE — ED Provider Notes (Signed)
[facility]    CSN: [id number] Arrival date & time: 01/20/22  1017      History   Chief Complaint Chief Complaint  Patient presents with   Toe Pain    HPI Joanna Sandoval is a 76 y.o. female.   HPI  She is in today for right great toe pain and swelling that has persisted overtime. She has tried several home remedies without any good results. She is concern due to her diabetes. She denies a history of gout. Denies fever, chills, headache, dizziness,  shortness of breath, dyspnea on exertion, chest pain any edema.    Past Medical History:  Diagnosis Date   Arthritis    Diabetes mellitus    Hypercholesteremia    Hypertension    Post-menopausal bleeding    Postmenopausal bleeding 08/30/2011   Normal EBX 09/2011.    Patient Active Problem List   Diagnosis Date Noted   Colon cancer screening 05/05/2020   Morbid obesity ([place]) 05/05/2020   Type 2 diabetes mellitus without complication, without long-term current use of insulin ([place]) 07/31/2018   Essential hypertension 04/30/2018   Fibroid 11/11/2011    Past Surgical History:  Procedure Laterality Date   CATARACT EXTRACTION     DILATION AND CURETTAGE OF UTERUS     HYSTEROSCOPY WITH D & C N/A 05/02/2017   Procedure: DILATATION AND CURETTAGE /HYSTEROSCOPY;  Surgeon: Mora Bellman, MD;  Location: [facility];  Service: Gynecology;  Laterality: N/A;   KNEE ARTHROSCOPY      OB History     Gravida  4   Para  3   Term  3   Preterm      AB  1   Living  1      SAB  1   IAB      Ectopic      Multiple      Live Births               Home Medications    Prior to Admission medications   Medication Sig Start Date End Date Taking? Authorizing Provider  cephALEXin (KEFLEX) 500 MG capsule Take 1 capsule (500 mg total) by mouth 2 (two) times daily for 5 days. 01/20/22 01/25/22 Yes Vevelyn Francois, NP  Accu-Chek Softclix Lancets lancets USE AS DIRECTED 09/30/21   [name], FNP   albuterol (PROVENTIL HFA;VENTOLIN HFA) 108 (90 Base) MCG/ACT inhaler Inhale 2 puffs into the lungs every 6 (six) hours as needed.    [provider]  atenolol (TENORMIN) 100 MG tablet Take 50 mg by mouth daily.     [provider]  atorvastatin (LIPITOR) 20 MG tablet Take 1 tablet (20 mg total) by mouth daily. 12/01/20 01/19/22  [name], FNP  Blood Glucose Monitoring Suppl (ACCU-CHEK GUIDE) w/Device KIT USE TO CHECK BLOOD SUGAR  TWICE DAILY AT 10AM AND 5PM 05/10/21   [name], FNP  carboxymethylcellulose (REFRESH PLUS) 0.5 % SOLN 1 drop 3 (three) times daily as needed.    [provider]  D3-50 50000 units capsule  01/14/17   [provider]  diclofenac sodium (VOLTAREN) 1 % GEL Apply 2 g topically 4 (four) times daily. 10/30/18   [name], FNP  FARXIGA 10 MG TABS tablet TAKE 1 TABLET BY MOUTH DAILY  BEFORE BREAKFAST 01/11/22   [name], FNP  glimepiride (AMARYL) 2 MG tablet TAKE 1 TABLET BY MOUTH IN  THE MORNING AND AT BEDTIME 07/01/21   [name], FNP  glucose blood (  ACCU-CHEK GUIDE) test strip Use as instructed 05/17/21   [name], FNP  [name] KIT Use to test blood sugar twice daily as directed. E11.65 05/02/19   [name], FNP  latanoprost (XALATAN) 0.005 % ophthalmic solution 1 drop at bedtime. 05/16/19   [provider]  losartan (COZAAR) 50 MG tablet Take 50 mg by mouth daily.    [provider]  Magnesium 200 MG TABS Take 1 tab by mouth daily with evening meal 07/30/19   [name], FNP  naproxen (NAPROSYN) 375 MG tablet Take 1 tablet (375 mg total) by mouth 2 (two) times daily. Patient not taking: Reported on 01/19/2022 09/25/21   Hans Eden, NP  ondansetron (ZOFRAN ODT) 4 MG disintegrating tablet Take 1 tablet (4 mg total) by mouth every 8 (eight) hours as needed for nausea or vomiting. 11/29/20   Alroy Bailiff, [place], PA-C  RYBELSUS 7 MG TABS Take 1 tablet by mouth daily.  08/09/21   [name], FNP    Family History Family History  Problem Relation Age of Onset   Hypertension Mother    Hypertension Father    Cancer Father        ?lung   Diabetes Sister     Social History Social History   Tobacco Use   Smoking status: Former    Types: Cigarettes   Smokeless tobacco: Never  Vaping Use   Vaping Use: Never used  Substance Use Topics   Alcohol use: No   Drug use: No     Allergies   Patient has no known allergies.   Review of Systems Review of Systems   Physical Exam Triage Vital Signs ED Triage Vitals [01/20/22 1142]  Enc Vitals Group     BP (!) 144/76     Pulse Rate 90     Resp 16     Temp 98 F (36.7 C)     Temp Source Oral     SpO2 100 %     Weight      Height      Head Circumference      Peak Flow      Pain Score 9     Pain Loc      Pain Edu?      Excl. in [place]?    No data found.  Updated Vital Signs BP (!) 144/76 (BP Location: Right Arm)   Pulse 90   Temp 98 F (36.7 C) (Oral)   Resp 16   SpO2 100%   Visual Acuity Right Eye Distance:   Left Eye Distance:   Bilateral Distance:    Right Eye Near:   Left Eye Near:    Bilateral Near:     Physical Exam HENT:     Head: Normocephalic and atraumatic.  Cardiovascular:     Rate and Rhythm: Normal rate.  Pulmonary:     Effort: Pulmonary effort is normal.  Musculoskeletal:        General: Swelling present.     Right foot: Normal pulse.     Left foot: Swelling and tenderness present. Normal pulse.  Skin:    General: Skin is warm and dry.     Capillary Refill: Capillary refill takes less than 2 seconds.  Neurological:     Mental Status: She is alert.      UC Treatments / Results  Labs (all labs ordered are listed, but only abnormal results are displayed)   EKG   Radiology No results found.  Procedures  Procedures (including critical care time)  Medications Ordered in UC Medications - No data to display  Initial Impression / Assessment and  Plan / UC Course  I have reviewed the triage vital signs and the nursing notes.  Pertinent labs & imaging results that were available during my care of the patient were reviewed by me and considered in my medical decision making (see chart for details).     Toe pain Final Clinical Impressions(s) / UC Diagnoses   Final diagnoses:  Pain of right great toe  Localized swelling of toe of right foot     Discharge Instructions      You have swelling to your right great toe with redness. Labs are pending for evaluation of Gout.  You will be notified of the results. Due to your diabetes we will treat you with keflex 500 mg BID for 5 day. Elevation. Small ice pack can be used for 5-10 minutes 4 times a day. The antibotic will help with the swelling. I recommend Tylenol for the pain.     ED Prescriptions     Medication Sig Dispense Auth. Provider   cephALEXin (KEFLEX) 500 MG capsule Take 1 capsule (500 mg total) by mouth 2 (two) times daily for 5 days. 10 capsule Vevelyn Francois, NP      PDMP not reviewed this encounter.   Dionisio David [place], [place] 01/20/22 2007

## 2022-01-20 NOTE — Discharge Instructions (Addendum)
You have swelling to your right great toe with redness. Labs are pending for evaluation of Gout.  You will be notified of the results. Due to your diabetes we will treat you with keflex 500 mg BID for 5 day. Elevation. Small ice pack can be used for 5-10 minutes 4 times a day. The antibotic will help with the swelling. I recommend Tylenol for the pain.

## 2022-01-26 ENCOUNTER — Encounter: Payer: Self-pay | Admitting: Nurse Practitioner

## 2022-01-26 ENCOUNTER — Ambulatory Visit: Payer: Self-pay | Admitting: Licensed Clinical Social Worker

## 2022-01-26 ENCOUNTER — Ambulatory Visit (INDEPENDENT_AMBULATORY_CARE_PROVIDER_SITE_OTHER): Payer: Medicare Other | Admitting: Nurse Practitioner

## 2022-01-26 VITALS — BP 130/80 | HR 84 | Temp 98.3°F | Ht 61.0 in | Wt 197.6 lb

## 2022-01-26 DIAGNOSIS — M79674 Pain in right toe(s): Secondary | ICD-10-CM | POA: Diagnosis not present

## 2022-01-26 MED ORDER — COLCHICINE 0.6 MG PO TABS: ORAL_TABLET | ORAL | 0 refills | Status: DC

## 2022-01-26 NOTE — Patient Outreach (Signed)
  Care Coordination   Follow Up Visit Note   01/26/2022 Name: Joanna Sandoval MRN: [id number] DOB: [birth year]  Joanna Sandoval is a 76 y.o. year old female who sees [name], [place] for primary care. I spoke with  [name] by phone today.  What matters to the patients health and wellness today?  [place] application and pain management    Goals Addressed             This Visit's Progress    Obtain Supportive Resources   On track    Care Coordination Interventions: Mindfulness or Relaxation training provided Active listening / Reflection utilized  Emotional Support Provided Verbalization of feelings encouraged  Pt reports financial strain while utilizing taxis to appts. LCSW provided update on current process after [place] application has been submitted LCSW reviewed upcoming appts Pt is currently obtaining prescribed medication to assist with pain Strategies to assist with managing symptoms while coping with pain discussed Patient identified strategies to promote health and well-being to decrease risk of COVID exposure             SDOH assessments and interventions completed:  No     Care Coordination Interventions Activated:  Yes  Care Coordination Interventions:  Yes, provided   Follow up plan: Follow up call scheduled for 11/02    Encounter Outcome:  Pt. Visit Completed   Christa See, MSW, [email or web address] Phone [phone] 11:33 AM

## 2022-01-26 NOTE — Patient Instructions (Signed)
Visit Information  Thank you for taking time to visit with me today. Please don't hesitate to contact me if I can be of assistance to you.   Following are the goals we discussed today:   Goals Addressed             This Visit's Progress    Obtain Supportive Resources   On track    Care Coordination Interventions: Mindfulness or Relaxation training provided Active listening / Reflection utilized  Emotional Support Provided Verbalization of feelings encouraged  Pt reports financial strain while utilizing taxis to appts. LCSW provided update on current process after [place] application has been submitted LCSW reviewed upcoming appts Pt is currently obtaining prescribed medication to assist with pain Strategies to assist with managing symptoms while coping with pain discussed Patient identified strategies to promote health and well-being to decrease risk of COVID exposure             Our next appointment is by telephone on 02/16/22 at 11 AM  Please call the care guide team at [phone] if you need to cancel or reschedule your appointment.   If you are experiencing a Mental Health or [place] or need someone to talk to, please call the Suicide and Crisis Lifeline: 988 call 911   Patient verbalizes understanding of instructions and care plan provided today and agrees to view in [place]. Active MyChart status and patient understanding of how to access instructions and care plan via MyChart confirmed with patient.     Christa See, MSW, [email or web address] Phone [phone] 11:33 AM

## 2022-01-26 NOTE — Progress Notes (Signed)
I,Tianna Badgett,acting as a Education administrator for [employer], FNP.,have documented all relevant documentation on the behalf of [name], FNP,as directed by  [name], FNP while in the presence of [name], [place].  Subjective:     Patient ID: Joanna Sandoval , female    DOB: 05/27/1945 , 76 y.o.   MRN: [id number]   Chief Complaint  Patient presents with   Toe Pain    HPI  She went to Urgent Care last Friday for right toe swelling. She is unsure if she hit her toe. Her foot is not swelling as much. She was treated with cephalexin. She limits her intake of beef, does not drink alcohol.   She had her sons dog for a weekend.      Past Medical History:  Diagnosis Date   Arthritis    Diabetes mellitus    Hypercholesteremia    Hypertension    Post-menopausal bleeding    Postmenopausal bleeding 08/30/2011   Normal EBX 09/2011.     Family History  Problem Relation Age of Onset   Hypertension Mother    Hypertension Father    Cancer Father        ?lung   Diabetes Sister      Current Outpatient Medications:    Accu-Chek Softclix Lancets lancets, USE AS DIRECTED, Disp: 200 each, Rfl: 3   albuterol (PROVENTIL HFA;VENTOLIN HFA) 108 (90 Base) MCG/ACT inhaler, Inhale 2 puffs into the lungs every 6 (six) hours as needed., Disp: , Rfl:    atenolol (TENORMIN) 100 MG tablet, Take 50 mg by mouth daily. , Disp: , Rfl:    cephALEXin (KEFLEX) 500 MG capsule, Take 500 mg by mouth 2 (two) times daily., Disp: , Rfl:    D3-50 50000 units capsule, , Disp: , Rfl: 0   diclofenac sodium (VOLTAREN) 1 % GEL, Apply 2 g topically 4 (four) times daily., Disp: 100 g, Rfl: 1   FARXIGA 10 MG TABS tablet, TAKE 1 TABLET BY MOUTH DAILY  BEFORE BREAKFAST, Disp: 100 tablet, Rfl: 2   glimepiride (AMARYL) 2 MG tablet, TAKE 1 TABLET BY MOUTH IN  THE MORNING AND AT BEDTIME, Disp: 180 tablet, Rfl: 3   glucose blood (ACCU-CHEK GUIDE) test strip, Use as instructed, Disp: 100 each, Rfl: 12   Lancets Misc. (ACCU-CHEK  FASTCLIX LANCET) KIT, Use to test blood sugar twice daily as directed. E11.65, Disp: 1 kit, Rfl: 12   latanoprost (XALATAN) 0.005 % ophthalmic solution, 1 drop at bedtime., Disp: , Rfl:    losartan (COZAAR) 50 MG tablet, Take 50 mg by mouth daily., Disp: , Rfl:    Magnesium 200 MG TABS, Take 1 tab by mouth daily with evening meal, Disp: 30 tablet, Rfl: 2   ondansetron (ZOFRAN ODT) 4 MG disintegrating tablet, Take 1 tablet (4 mg total) by mouth every 8 (eight) hours as needed for nausea or vomiting., Disp: 20 tablet, Rfl: 0   RYBELSUS 7 MG TABS, Take 1 tablet by mouth daily., Disp: 90 tablet, Rfl: 1   atorvastatin (LIPITOR) 20 MG tablet, Take 1 tablet (20 mg total) by mouth daily., Disp: 90 tablet, Rfl: 1   Blood Glucose Monitoring Suppl (ACCU-CHEK GUIDE) w/Device KIT, USE TO CHECK BLOOD SUGAR  TWICE DAILY AT 10AM AND 5PM, Disp: 1 kit, Rfl: 1   carboxymethylcellulose (REFRESH PLUS) 0.5 % SOLN, 1 drop 3 (three) times daily as needed., Disp: , Rfl:    colchicine 0.6 MG tablet, Take 1 tablet by mouth daily x 3 days with flares, Disp:  30 tablet, Rfl: 0   naproxen (NAPROSYN) 375 MG tablet, Take 1 tablet (375 mg total) by mouth 2 (two) times daily. (Patient not taking: Reported on 01/19/2022), Disp: 20 tablet, Rfl: 0   No Known Allergies   Review of Systems  Constitutional: Negative.   Respiratory: Negative.    Cardiovascular: Negative.   Gastrointestinal: Negative.   Neurological: Negative.      Today's Vitals   01/26/22 0851  BP: 130/80  Pulse: 84  Temp: 98.3 F (36.8 C)  TempSrc: Oral  Weight: 197 lb 9.6 oz (89.6 kg)  Height: 5' 1" (1.549 m)   Body mass index is 37.34 kg/m.   Objective:  Physical Exam Vitals reviewed.  Constitutional:      General: She is not in acute distress.    Appearance: Normal appearance. She is obese.  HENT:     Head: Normocephalic.  Eyes:     Extraocular Movements: Extraocular movements intact.     Conjunctiva/sclera: Conjunctivae normal.     Pupils:  Pupils are equal, round, and reactive to light.  Cardiovascular:     Rate and Rhythm: Normal rate and regular rhythm.     Pulses: Normal pulses.     Heart sounds: Normal heart sounds. No murmur heard. Pulmonary:     Effort: Pulmonary effort is normal. No respiratory distress.     Breath sounds: Normal breath sounds. No wheezing.  Chest:     Chest wall: No mass.  Abdominal:     General: Abdomen is flat. Bowel sounds are normal. There is no distension.     Palpations: Abdomen is soft. There is no mass.     Tenderness: There is no abdominal tenderness.  Musculoskeletal:        General: Tenderness (right great toe with erythema) present. No swelling.     Comments: Using a cane for support  Skin:    General: Skin is warm and dry.     Capillary Refill: Capillary refill takes less than 2 seconds.  Neurological:     General: No focal deficit present.     Mental Status: She is alert and oriented to person, place, and time.     Cranial Nerves: No cranial nerve deficit.     Motor: No weakness.  Psychiatric:        Mood and Affect: Mood normal.        Behavior: Behavior normal.        Thought Content: Thought content normal.        Judgment: Judgment normal.        Assessment And Plan:     1. Pain of toe of right foot Comments: This is likely a gout flare, Uric Acid was negative however SedRate is elevated at 35. Will treat with colchicine if not better on Monday will tx with steroids - colchicine 0.6 MG tablet; Take 1 tablet by mouth daily x 3 days with flares  Dispense: 30 tablet; Refill: 0    Patient was given opportunity to ask questions. Patient verbalized understanding of the plan and was able to repeat key elements of the plan. All questions were answered to their satisfaction.  [name], FNP   I, [name], FNP, have reviewed all documentation for this visit. The documentation on 01/26/22 for the exam, diagnosis, procedures, and orders are all accurate and complete.    IF YOU HAVE BEEN REFERRED TO A SPECIALIST, IT MAY TAKE 1-2 WEEKS TO SCHEDULE/PROCESS THE REFERRAL. IF YOU HAVE NOT HEARD FROM US/SPECIALIST IN  TWO WEEKS, PLEASE GIVE [place] A CALL AT [phone] X [phone].   THE PATIENT IS ENCOURAGED TO PRACTICE SOCIAL DISTANCING DUE TO THE COVID-19 PANDEMIC.

## 2022-01-30 ENCOUNTER — Ambulatory Visit: Payer: Self-pay

## 2022-01-30 NOTE — Patient Outreach (Signed)
  Care Coordination   01/30/2022 Name: Joel Mericle MRN: [id number] DOB: 12/09/1945   Care Coordination Outreach Attempts:  An unsuccessful telephone outreach was attempted for a scheduled appointment today.  Follow Up Plan:  Additional outreach attempts will be made to offer the patient care coordination information and services.   Encounter Outcome:  No Answer  Care Coordination Interventions Activated:  No   Care Coordination Interventions:  No, not indicated    [name], RN, BSN, CCM Care Management Coordinator [place] [facility]  Direct Phone: [phone]

## 2022-01-31 ENCOUNTER — Ambulatory Visit: Payer: Self-pay

## 2022-01-31 ENCOUNTER — Other Ambulatory Visit: Payer: Self-pay | Admitting: Nurse Practitioner

## 2022-01-31 DIAGNOSIS — M79674 Pain in right toe(s): Secondary | ICD-10-CM

## 2022-01-31 NOTE — Patient Instructions (Signed)
Visit Information  Thank you for taking time to visit with me today. Please don't hesitate to contact me if I can be of assistance to you.   Following are the goals we discussed today:   Goals Addressed               This Visit's Progress     Patient Stated     I have swelling on my right great toe (pt-stated)        Care Coordination Interventions: Evaluation of current treatment plan related to pain and swelling to great right toe and patient's adherence to plan as established by provider Determined patient continues to experience pain and swelling to her right great toe Discussed patient completed the 3 day course of Colchicine as directed by PCP Determined the swelling to patient's feet has subsided, however she continues to have pain and swelling to her right great toe Educated patient regarding the importance of reporting persistent or worsening symptoms to her PCP due to risk of delayed healing secondary to having diabetes Determined patient reports calling her PCP on 01/30/22 to report ongoing symptoms  Sent secure message to PCP [name] to report patient's persistent symptoms of pain and swelling to her right great toe          Our next appointment is by telephone on 02/13/22 at 0930AM  Please call the care guide team at [phone] if you need to cancel or reschedule your appointment.   If you are experiencing a Mental Health or [place] or need someone to talk to, please call 1-800-273-TALK (toll free, 24 hour hotline) go to [facility] [address] [place] [phone])  Patient verbalizes understanding of instructions and care plan provided today and agrees to view in [place]. Active MyChart status and patient understanding of how to access instructions and care plan via MyChart confirmed with patient.     [name], RN, BSN, CCM Care Management Coordinator [place]  Management Direct Phone: [phone]

## 2022-01-31 NOTE — Patient Outreach (Signed)
  Care Coordination   Follow Up Visit Note   01/31/2022 Name: Timmya Blazier MRN: [id number] DOB: July 09, 1945  Satomi Buda is a 76 y.o. year old female who sees [name], [place] for primary care. I spoke with  [name] by phone today.  What matters to the patients health and wellness today?  Patient was instructed about having a right foot xray. Patient verbalizes understanding regarding recommendations to follow up with Podiatry.     Goals Addressed               This Visit's Progress     Patient Stated     I have swelling on my right great toe (pt-stated)        Care Coordination Interventions: Evaluation of current treatment plan related to pain and swelling to great right toe and patient's adherence to plan as established by provider Collaboration with PCP to advise patient recommendations to follow up with a Podiatrist, a referral will be placed Placed successful outbound call to patient to advise PCP has ordered an xray of her right foot for dx: pain to right great toe Provided patient with the address and phone number for [facility] imaging to obtain xray, patient repeated back information provided and will have her brother assist with transportation Advised patient PCP will place Podiatry referral and educated patient regarding next steps          SDOH assessments and interventions completed:  No     Care Coordination Interventions Activated:  Yes  Care Coordination Interventions:  Yes, provided   Follow up plan: Follow up call scheduled for 02/07/22 '@12'$ :30 PM     Encounter Outcome:  Pt. Visit Completed

## 2022-01-31 NOTE — Patient Instructions (Signed)
Visit Information  Thank you for taking time to visit with me today. Please don't hesitate to contact me if I can be of assistance to you.   Following are the goals we discussed today:   Goals Addressed               This Visit's Progress     Patient Stated     I have swelling on my right great toe (pt-stated)        Care Coordination Interventions: Evaluation of current treatment plan related to pain and swelling to great right toe and patient's adherence to plan as established by provider Collaboration with PCP to advise patient recommendations to follow up with a Podiatrist, a referral will be placed Placed successful outbound call to patient to advise PCP has ordered an xray of her right foot for dx: pain to right great toe Provided patient with the address and phone number for [facility] imaging to obtain xray, patient repeated back information provided and will have her brother assist with transportation Advised patient PCP will place Podiatry referral and educated patient regarding next steps          Our next appointment is by telephone on 02/07/22 at 12:30 PM  Please call the care guide team at [phone] if you need to cancel or reschedule your appointment.   If you are experiencing a Mental Health or [place] or need someone to talk to, please call 1-800-273-TALK (toll free, 24 hour hotline) go to [facility] [address], [place] [phone])  Patient verbalizes understanding of instructions and care plan provided today and agrees to view in [place]. Active MyChart status and patient understanding of how to access instructions and care plan via MyChart confirmed with patient.     [name], RN, BSN, CCM Care Management Coordinator [place] Management Direct Phone: [phone]

## 2022-01-31 NOTE — Patient Outreach (Signed)
  Care Coordination   Follow Up Visit Note   01/31/2022 Name: Joanna Sandoval MRN: [id number] DOB: [birth year]  Joanna Sandoval is a 76 y.o. year old female who sees [name], [place] for primary care. I spoke with  Joanna Sandoval by phone today.  What matters to the patients health and wellness today?  Patient continues to have pain and swelling to her right great toe.     Goals Addressed               This Visit's Progress     Patient Stated     I have swelling on my right great toe (pt-stated)        Care Coordination Interventions: Evaluation of current treatment plan related to pain and swelling to great right toe and patient's adherence to plan as established by provider Determined patient continues to experience pain and swelling to her right great toe Discussed patient completed the 3 day course of Colchicine as directed by PCP Determined the swelling to patient's feet has subsided, however she continues to have pain and swelling to her right great toe Educated patient regarding the importance of reporting persistent or worsening symptoms to her PCP due to risk of delayed healing secondary to having diabetes Determined patient reports calling her PCP on 01/30/22 to report ongoing symptoms  Sent secure message to PCP [name] to report patient's persistent symptoms of pain and swelling to her right great toe          SDOH assessments and interventions completed:  Yes  SDOH Interventions Today    Flowsheet Row Most Recent Value  SDOH Interventions   Transportation Interventions Other (Comment)  [patient applied for [facility], however will hold off on pursuing this provider for transportation at this time, she will continue to use a taxi]        Care Coordination Interventions Activated:  Yes  Care Coordination Interventions:  Yes, provided   Follow up plan: Follow up call scheduled for 02/13/22 '@0930AM'$     Encounter Outcome:  Pt. Visit Completed

## 2022-02-01 ENCOUNTER — Other Ambulatory Visit: Payer: Self-pay

## 2022-02-01 ENCOUNTER — Ambulatory Visit
Admission: RE | Admit: 2022-02-01 | Discharge: 2022-02-01 | Disposition: A | Discharge status: Home / Self Care | Payer: Medicare Other | Source: Ambulatory Visit | Attending: Nurse Practitioner | Admitting: Nurse Practitioner

## 2022-02-01 ENCOUNTER — Ambulatory Visit: Payer: Medicare Other

## 2022-02-01 DIAGNOSIS — M19071 Primary osteoarthritis, right ankle and foot: Secondary | ICD-10-CM | POA: Diagnosis not present

## 2022-02-01 DIAGNOSIS — M79674 Pain in right toe(s): Secondary | ICD-10-CM

## 2022-02-01 NOTE — Progress Notes (Signed)
This encounter was created in error - please disregard.

## 2022-02-01 NOTE — Patient Outreach (Signed)
  Care Coordination   Follow Up Visit Note   02/01/2022 Name: Mimi Debellis MRN: [id number] DOB: 1945/06/01  Earsie Humm is a 76 y.o. year old female who sees [name], [place] for primary care. I spoke with  [name] by phone today.  What matters to the patients health and wellness today?  Patient is scheduled to have an xray of her right foot.    Goals Addressed               This Visit's Progress     Patient Stated     I have swelling on my right great toe (pt-stated)        Care Coordination Interventions: Evaluation of current treatment plan related to pain and swelling to great right toe and patient's adherence to plan as established by provider Collaboration with PCP regarding a call received from patient this morning stating she was told by [facility] that the order for her foot xray had not been entered Determined the order was placed by PCP on 01/31/22 Placed outbound call to patient to advise the order was entered yesterday as discussed and patient can call to schedule her xray Received message from PCP stating the patient was able to schedule her foot xray for today           SDOH assessments and interventions completed:  No     Care Coordination Interventions Activated:  Yes  Care Coordination Interventions:  Yes, provided   Follow up plan: Follow up call scheduled for 02/07/22 '@1230'$  PM     Encounter Outcome:  Pt. Visit Completed

## 2022-02-03 ENCOUNTER — Other Ambulatory Visit: Payer: Self-pay | Admitting: Nurse Practitioner

## 2022-02-03 DIAGNOSIS — M19079 Primary osteoarthritis, unspecified ankle and foot: Secondary | ICD-10-CM

## 2022-02-03 MED ORDER — NAPROXEN 375 MG PO TABS: 375.0000 mg | ORAL_TABLET | Freq: Two times a day (BID) | ORAL | 0 refills | Status: DC

## 2022-02-06 ENCOUNTER — Encounter: Payer: Self-pay | Admitting: Nurse Practitioner

## 2022-02-06 ENCOUNTER — Ambulatory Visit (INDEPENDENT_AMBULATORY_CARE_PROVIDER_SITE_OTHER): Payer: Medicare Other | Admitting: Nurse Practitioner

## 2022-02-06 VITALS — BP 138/70 | HR 91 | Temp 98.5°F | Ht 61.0 in | Wt 195.0 lb

## 2022-02-06 DIAGNOSIS — Z6836 Body mass index (BMI) 36.0-36.9, adult: Secondary | ICD-10-CM

## 2022-02-06 DIAGNOSIS — K0889 Other specified disorders of teeth and supporting structures: Secondary | ICD-10-CM

## 2022-02-06 DIAGNOSIS — K121 Other forms of stomatitis: Secondary | ICD-10-CM | POA: Diagnosis not present

## 2022-02-06 MED ORDER — MAGIC MOUTHWASH W/LIDOCAINE: 5.0000 mL | Freq: Three times a day (TID) | ORAL | 0 refills | Status: DC

## 2022-02-06 MED ORDER — AMOXICILLIN-POT CLAVULANATE 500-125 MG PO TABS: 1.0000 | ORAL_TABLET | Freq: Three times a day (TID) | ORAL | 0 refills | Status: DC

## 2022-02-06 NOTE — Progress Notes (Signed)
I,Tianna Badgett,acting as a Education administrator for [employer], FNP.,have documented all relevant documentation on the behalf of [name], FNP,as directed by  [name], FNP while in the presence of [name], [place].  Subjective:     Patient ID: Joanna Sandoval , female    DOB: March 31, 1946 , 76 y.o.   MRN: [id number]   Chief Complaint  Patient presents with   Dental Pain    HPI  Patient presents today for mouth pain. She states this started yesterday. She is having pain to her left upper molar. She has called the nurse line with her insurance company and they advised her to come to the PCP for antibiotics. She reports it "cost money to see the dentist".    Dental Pain  This is a new problem. The current episode started yesterday.     Past Medical History:  Diagnosis Date   Arthritis    Diabetes mellitus    Hypercholesteremia    Hypertension    Post-menopausal bleeding    Postmenopausal bleeding 08/30/2011   Normal EBX 09/2011.     Family History  Problem Relation Age of Onset   Hypertension Mother    Hypertension Father    Cancer Father        ?lung   Diabetes Sister      Current Outpatient Medications:    amoxicillin-clavulanate (AUGMENTIN) 500-125 MG tablet, Take 1 tablet by mouth 3 (three) times daily., Disp: 20 tablet, Rfl: 0   Accu-Chek Softclix Lancets lancets, USE AS DIRECTED, Disp: 200 each, Rfl: 3   albuterol (PROVENTIL HFA;VENTOLIN HFA) 108 (90 Base) MCG/ACT inhaler, Inhale 2 puffs into the lungs every 6 (six) hours as needed., Disp: , Rfl:    atenolol (TENORMIN) 100 MG tablet, Take 50 mg by mouth daily. , Disp: , Rfl:    atorvastatin (LIPITOR) 20 MG tablet, Take 1 tablet (20 mg total) by mouth daily., Disp: 90 tablet, Rfl: 1   Blood Glucose Monitoring Suppl (ACCU-CHEK GUIDE) w/Device KIT, USE TO CHECK BLOOD SUGAR  TWICE DAILY AT 10AM AND 5PM, Disp: 1 kit, Rfl: 1   carboxymethylcellulose (REFRESH PLUS) 0.5 % SOLN, 1 drop 3 (three) times daily as needed., Disp: ,  Rfl:    colchicine 0.6 MG tablet, Take 1 tablet by mouth daily x 3 days with flares, Disp: 30 tablet, Rfl: 0   D3-50 50000 units capsule, , Disp: , Rfl: 0   diclofenac sodium (VOLTAREN) 1 % GEL, Apply 2 g topically 4 (four) times daily., Disp: 100 g, Rfl: 1   FARXIGA 10 MG TABS tablet, TAKE 1 TABLET BY MOUTH DAILY  BEFORE BREAKFAST, Disp: 100 tablet, Rfl: 2   glimepiride (AMARYL) 2 MG tablet, TAKE 1 TABLET BY MOUTH IN  THE MORNING AND AT BEDTIME, Disp: 180 tablet, Rfl: 3   glucose blood (ACCU-CHEK GUIDE) test strip, Use as instructed, Disp: 100 each, Rfl: 12   [name] KIT, Use to test blood sugar twice daily as directed. E11.65, Disp: 1 kit, Rfl: 12   latanoprost (XALATAN) 0.005 % ophthalmic solution, 1 drop at bedtime., Disp: , Rfl:    losartan (COZAAR) 50 MG tablet, Take 50 mg by mouth daily., Disp: , Rfl:    magic mouthwash w/lidocaine SOLN, Take 5 mLs by mouth 3 (three) times daily. 1 part each solution, Disp: 120 mL, Rfl: 0   Magnesium 200 MG TABS, Take 1 tab by mouth daily with evening meal, Disp: 30 tablet, Rfl: 2   naproxen (NAPROSYN) 375 MG  tablet, Take 1 tablet (375 mg total) by mouth 2 (two) times daily., Disp: 20 tablet, Rfl: 0   ondansetron (ZOFRAN ODT) 4 MG disintegrating tablet, Take 1 tablet (4 mg total) by mouth every 8 (eight) hours as needed for nausea or vomiting., Disp: 20 tablet, Rfl: 0   RYBELSUS 7 MG TABS, Take 1 tablet by mouth daily., Disp: 90 tablet, Rfl: 1   No Known Allergies   Review of Systems  Constitutional: Negative.   HENT:  Positive for dental problem.   Respiratory: Negative.    Cardiovascular: Negative.   Gastrointestinal: Negative.   Neurological: Negative.      Today's Vitals   02/06/22 1058  BP: 138/70  Pulse: 91  Temp: 98.5 F (36.9 C)  TempSrc: Oral  Weight: 195 lb (88.5 kg)  Height: $Remove'5\' 1"'bTMuCyJ$  (1.549 m)   Body mass index is 36.84 kg/m.  Wt Readings from Last 3 Encounters:  02/06/22 195 lb (88.5 kg)   01/26/22 197 lb 9.6 oz (89.6 kg)  01/19/22 198 lb (89.8 kg)    Objective:  Physical Exam Vitals reviewed.  Constitutional:      General: She is not in acute distress.    Appearance: Normal appearance. She is obese.  HENT:     Head: Normocephalic.  Eyes:     Extraocular Movements: Extraocular movements intact.     Conjunctiva/sclera: Conjunctivae normal.     Pupils: Pupils are equal, round, and reactive to light.  Cardiovascular:     Rate and Rhythm: Normal rate and regular rhythm.     Pulses: Normal pulses.     Heart sounds: Normal heart sounds. No murmur heard. Pulmonary:     Effort: Pulmonary effort is normal. No respiratory distress.     Breath sounds: Normal breath sounds. No wheezing.  Chest:     Chest wall: No mass.  Abdominal:     General: Abdomen is flat. Bowel sounds are normal. There is no distension.     Palpations: Abdomen is soft. There is no mass.     Tenderness: There is no abdominal tenderness.  Musculoskeletal:        General: No tenderness.     Comments: Using a cane for support  Skin:    General: Skin is warm and dry.     Capillary Refill: Capillary refill takes less than 2 seconds.  Neurological:     General: No focal deficit present.     Mental Status: She is alert and oriented to person, place, and time.     Cranial Nerves: No cranial nerve deficit.     Motor: No weakness.  Psychiatric:        Mood and Affect: Mood normal.        Behavior: Behavior normal.        Thought Content: Thought content normal.        Judgment: Judgment normal.         Assessment And Plan:     1. Tooth pain Comments: She is advised to seek dental care for further evaluation. Left facial swelling upper.  - amoxicillin-clavulanate (AUGMENTIN) 500-125 MG tablet; Take 1 tablet by mouth 3 (three) times daily.  Dispense: 20 tablet; Refill: 0 - magic mouthwash w/lidocaine SOLN; Take 5 mLs by mouth 3 (three) times daily. 1 part each solution  Dispense: 120 mL; Refill:  0  2. Class 2 severe obesity due to excess calories with serious comorbidity and body mass index (BMI) of 36.0 to 36.9 in adult [facility]) She is  encouraged to strive for BMI less than 30 to decrease cardiac risk. Advised to aim for at least 150 minutes of exercise per week.  3. Ulceration of oral mucosa Comments: left lateral gum with small ulceration and has swelling noted    Patient was given opportunity to ask questions. Patient verbalized understanding of the plan and was able to repeat key elements of the plan. All questions were answered to their satisfaction.  [name], FNP   I, [name], FNP, have reviewed all documentation for this visit. The documentation on 02/06/22 for the exam, diagnosis, procedures, and orders are all accurate and complete.   IF YOU HAVE BEEN REFERRED TO A SPECIALIST, IT MAY TAKE 1-2 WEEKS TO SCHEDULE/PROCESS THE REFERRAL. IF YOU HAVE NOT HEARD FROM US/SPECIALIST IN TWO WEEKS, PLEASE GIVE [place] A CALL AT [phone].   THE PATIENT IS ENCOURAGED TO PRACTICE SOCIAL DISTANCING DUE TO THE COVID-19 PANDEMIC.

## 2022-02-06 NOTE — Patient Instructions (Signed)
Dental Pain Dental pain is often a sign that something is wrong with your teeth or gums. You can also have pain after a dental treatment. If you have dental pain, it is important to contact your dentist, especially if the cause of the pain is not known. Dental pain may hurt a lot or a little and can be caused by many things, including: Tooth decay (cavities or caries). Infection. The inner part of the tooth being filled with pus (an abscess). Injury. A crack in the tooth. Gums that move back and expose the root of a tooth. Gum disease. Abnormal grinding or clenching of teeth. Not taking good care of your teeth. Sometimes the cause of pain is not known. You may have pain all the time, or it may happen only when you are: Chewing. Exposed to hot or cold temperatures. Eating or drinking foods or drinks that have a lot of sugar in them, such as soda or candy. Follow these instructions at home: Medicines Take over-the-counter and prescription medicines only as told by your dentist. If you were prescribed an antibiotic medicine, take it as told by your dentist. Do not stop taking it even if you start to feel better. Eating and drinking Do not eat foods or drinks that cause you pain. These include: Very hot or very cold foods or drinks. Sweet or sugary foods or drinks. Managing pain and swelling  If told, put ice on the painful area of your face. To do this: Put ice in a plastic bag. Place a towel between your skin and the bag. Leave the ice on for 20 minutes, 2-3 times a day. Take off the ice if your skin turns bright red. This is very important. If you cannot feel pain, heat, or cold, you have a greater risk of damage to the area. Brushing your teeth Brush your teeth twice a day using a fluoride toothpaste. Use a toothpaste made for sensitive teeth as told by your dentist. Use a soft toothbrush. General instructions Floss your teeth at least once a day. Do not put heat on the outside  of your face. Rinse your mouth often with salt water. To make salt water, dissolve -1 tsp (3-6 g) of salt in 1 cup (237 mL) of warm water. Watch your dental pain. Let your dentist know if there are any changes. Keep all follow-up visits. Contact a dentist if: You have dental pain and you do not know why. Medicine does not help your pain. Your symptoms get worse. You have new symptoms. Get help right away if: You cannot open your mouth. You are having trouble breathing or swallowing. You have a fever. Your face, neck, or jaw is swollen. These symptoms may be an emergency. Get help right away. Call your local emergency services (911 in the [place] Do not wait to see if the symptoms will go away. Do not drive yourself to the hospital. Summary Dental pain may be caused by many things, including tooth decay, injury, or infection. In some cases, the cause is not known. Dental pain may hurt a lot or very little. You may have pain all the time, or you may have it only when you eat or drink. Take over-the-counter and prescription medicines only as told by your dentist. Watch your dental pain for any changes. Let your dentist know if symptoms get worse. This information is not intended to replace advice given to you by your health care provider. Make sure you discuss any questions you have with   your health care provider. Document Revised: 01/07/2020 Document Reviewed: 01/07/2020 Elsevier Patient Education  2023 Elsevier Inc.  

## 2022-02-07 ENCOUNTER — Telehealth: Payer: Self-pay

## 2022-02-07 ENCOUNTER — Ambulatory Visit: Payer: Self-pay

## 2022-02-07 DIAGNOSIS — K0889 Other specified disorders of teeth and supporting structures: Secondary | ICD-10-CM

## 2022-02-07 MED ORDER — MAGIC MOUTHWASH W/LIDOCAINE: 5.0000 mL | Freq: Three times a day (TID) | ORAL | 0 refills | Status: DC

## 2022-02-07 NOTE — Patient Outreach (Signed)
  Care Coordination   Follow Up Visit Note   02/07/2022 Name: Myan Locatelli MRN: [id number] DOB: 09-18-1945  Taleyah Hillman is a 76 y.o. year old female who sees [name], [place] for primary care. I spoke with  [name] by phone today.  What matters to the patients health and wellness today?  Patient is experiencing a tooth ache. She will call a dentist to schedule a follow up. Patient is scheduled to f/u with Podiatry this week.     Goals Addressed               This Visit's Progress     Patient Stated     I have swelling on my right great toe (pt-stated)        Care Coordination Interventions: Evaluation of current treatment plan related to pain and swelling to great right toe and patient's adherence to plan as established by provider Determined patient completed her foot xray, her results were reviewed with her by PCP Determined PCP placed Podiatry referral, patient has her initial visit scheduled for 02/10/22      I need to see a dentist (pt-stated)        Care Coordination Interventions: Evaluation of current treatment plan related to dental pain and patient's adherence to plan as established by provider Determined patient is experiencing tooth pain, she f/u with PCP on 02/06/22 and started an antibiotic Educated patient on the importance of completing full course of antibiotics Educated on the risk for dental decay and infection secondary to diabetes Educated patient on the importance of following up with a dentist for further evaluation and ongoing maintenance  Provided patient with the contact name/number for [facility], she will consider scheduling an appointment            SDOH assessments and interventions completed:  No     Care Coordination Interventions Activated:  Yes  Care Coordination Interventions:  Yes, provided   Follow up plan: Follow up call scheduled for 03/13/22 '@1'$ :00 PM    Encounter Outcome:  Pt. Visit Completed

## 2022-02-07 NOTE — Telephone Encounter (Signed)
Error

## 2022-02-07 NOTE — Patient Instructions (Signed)
Visit Information  Thank you for taking time to visit with me today. Please don't hesitate to contact me if I can be of assistance to you.   Following are the goals we discussed today:   Goals Addressed               This Visit's Progress     Patient Stated     I have swelling on my right great toe (pt-stated)        Care Coordination Interventions: Evaluation of current treatment plan related to pain and swelling to great right toe and patient's adherence to plan as established by provider Determined patient completed her foot xray, her results were reviewed with her by PCP Determined PCP placed Podiatry referral, patient has her initial visit scheduled for 02/10/22      I need to see a dentist (pt-stated)        Care Coordination Interventions: Evaluation of current treatment plan related to dental pain and patient's adherence to plan as established by provider Determined patient is experiencing tooth pain, she f/u with PCP on 02/06/22 and started an antibiotic Educated patient on the importance of completing full course of antibiotics Educated on the risk for dental decay and infection secondary to diabetes Educated patient on the importance of following up with a dentist for further evaluation and ongoing maintenance  Provided patient with the contact name/number for [facility] dental services, she will consider scheduling an appointment            Our next appointment is by telephone on 03/13/22 at 1:00 PM  Please call the care guide team at [phone] if you need to cancel or reschedule your appointment.   If you are experiencing a Mental Health or [place] or need someone to talk to, please call 1-800-273-TALK (toll free, 24 hour hotline) go to [facility] [address] [place] [phone])  Patient verbalizes understanding of instructions and care plan provided today and agrees to view in [place]. Active MyChart  status and patient understanding of how to access instructions and care plan via MyChart confirmed with patient.     [name], RN, BSN, CCM Care Management Coordinator [facility] Care Management Direct Phone: [phone]

## 2022-02-10 ENCOUNTER — Encounter: Payer: Self-pay | Admitting: Podiatry

## 2022-02-10 ENCOUNTER — Ambulatory Visit: Payer: Medicare Other | Admitting: Podiatry

## 2022-02-10 DIAGNOSIS — M7751 Other enthesopathy of right foot: Secondary | ICD-10-CM | POA: Diagnosis not present

## 2022-02-10 NOTE — Progress Notes (Signed)
Subjective:  Patient ID: Joanna Sandoval, female    DOB: 09/21/1945,  MRN: [id number]  Chief Complaint  Patient presents with   Diabetes    Great toe pain     76 y.o. female presents with the above complaint.  Patient presents with right first interphalangeal joint pain.  She states been going on for the last week or so has progressive gotten worse hurts with ambulation worse with pressure.  She is a diabetic she was concerned that she may lose the toe.  Her A1c is around 7%.  She wanted to get evaluated hurts with ambulation hurts with pressure she went to urgent care who put her on gout medication thinking was gout however she states that it was never red hot swollen.  She does not have alcohol intake she occasionally eats hamburgers.  Review of Systems: Negative except as noted in the HPI. Denies N/V/F/Ch.  Past Medical History:  Diagnosis Date   Arthritis    Diabetes mellitus    Hypercholesteremia    Hypertension    Post-menopausal bleeding    Postmenopausal bleeding 08/30/2011   Normal EBX 09/2011.    Current Outpatient Medications:    Accu-Chek Softclix Lancets lancets, USE AS DIRECTED, Disp: 200 each, Rfl: 3   albuterol (PROVENTIL HFA;VENTOLIN HFA) 108 (90 Base) MCG/ACT inhaler, Inhale 2 puffs into the lungs every 6 (six) hours as needed., Disp: , Rfl:    amoxicillin-clavulanate (AUGMENTIN) 500-125 MG tablet, Take 1 tablet by mouth 3 (three) times daily., Disp: 20 tablet, Rfl: 0   atenolol (TENORMIN) 100 MG tablet, Take 50 mg by mouth daily. , Disp: , Rfl:    atorvastatin (LIPITOR) 20 MG tablet, Take 1 tablet (20 mg total) by mouth daily., Disp: 90 tablet, Rfl: 1   Blood Glucose Monitoring Suppl (ACCU-CHEK GUIDE) w/Device KIT, USE TO CHECK BLOOD SUGAR  TWICE DAILY AT 10AM AND 5PM, Disp: 1 kit, Rfl: 1   carboxymethylcellulose (REFRESH PLUS) 0.5 % SOLN, 1 drop 3 (three) times daily as needed., Disp: , Rfl:    colchicine 0.6 MG tablet, Take 1 tablet by mouth daily x 3 days with  flares, Disp: 30 tablet, Rfl: 0   D3-50 50000 units capsule, , Disp: , Rfl: 0   diclofenac sodium (VOLTAREN) 1 % GEL, Apply 2 g topically 4 (four) times daily., Disp: 100 g, Rfl: 1   FARXIGA 10 MG TABS tablet, TAKE 1 TABLET BY MOUTH DAILY  BEFORE BREAKFAST, Disp: 100 tablet, Rfl: 2   glimepiride (AMARYL) 2 MG tablet, TAKE 1 TABLET BY MOUTH IN  THE MORNING AND AT BEDTIME, Disp: 180 tablet, Rfl: 3   glucose blood (ACCU-CHEK GUIDE) test strip, Use as instructed, Disp: 100 each, Rfl: 12   [name] KIT, Use to test blood sugar twice daily as directed. E11.65, Disp: 1 kit, Rfl: 12   latanoprost (XALATAN) 0.005 % ophthalmic solution, 1 drop at bedtime., Disp: , Rfl:    losartan (COZAAR) 50 MG tablet, Take 50 mg by mouth daily., Disp: , Rfl:    magic mouthwash w/lidocaine SOLN, Take 5 mLs by mouth 3 (three) times daily. 1 part each solution, Disp: 120 mL, Rfl: 0   Magnesium 200 MG TABS, Take 1 tab by mouth daily with evening meal, Disp: 30 tablet, Rfl: 2   naproxen (NAPROSYN) 375 MG tablet, Take 1 tablet (375 mg total) by mouth 2 (two) times daily., Disp: 20 tablet, Rfl: 0   ondansetron (ZOFRAN ODT) 4 MG disintegrating tablet, Take 1 tablet (  4 mg total) by mouth every 8 (eight) hours as needed for nausea or vomiting., Disp: 20 tablet, Rfl: 0   RYBELSUS 7 MG TABS, Take 1 tablet by mouth daily., Disp: 90 tablet, Rfl: 1  Social History   Tobacco Use  Smoking Status Former   Types: Cigarettes  Smokeless Tobacco Never    No Known Allergies Objective:  There were no vitals filed for this visit. There is no height or weight on file to calculate BMI. Constitutional Well developed. Well nourished.  Vascular Dorsalis pedis pulses palpable bilaterally. Posterior tibial pulses palpable bilaterally. Capillary refill normal to all digits.  No cyanosis or clubbing noted. Pedal hair growth normal.  Neurologic Normal speech. Oriented to person, place, and time. Epicritic  sensation to light touch grossly present bilaterally.  Dermatologic Nails well groomed and normal in appearance. No open wounds. No skin lesions.  Orthopedic: Pain on palpation of right first interphalangeal joint.  Hurts with ambulation hurts with pressure hurts with range of motion deep intra-articular pain noted.  No pain at the metatarsophalangeal joint no pain at the sesamoidal complex.   Radiographs: None Assessment:   1. Capsulitis of toe, right    Plan:  Patient was evaluated and treated and all questions answered.  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  Patient agrees with plan like to proceed with steroid injection. -A steroid injection was performed at right hallux IPJ using 1% plain Lidocaine and 10 mg of Kenalog. This was well tolerated.   No follow-ups on file.  

## 2022-02-14 ENCOUNTER — Other Ambulatory Visit: Payer: Self-pay | Admitting: Nurse Practitioner

## 2022-02-14 DIAGNOSIS — E119 Type 2 diabetes mellitus without complications: Secondary | ICD-10-CM

## 2022-02-16 ENCOUNTER — Ambulatory Visit: Payer: Self-pay | Admitting: Licensed Clinical Social Worker

## 2022-02-16 NOTE — Patient Outreach (Signed)
  Care Coordination   Follow Up Visit Note   02/16/2022 Name: Damilola Flamm MRN: [id number] DOB: 02/13/1946  Pammy Vesey is a 76 y.o. year old female who sees [name], [place] for primary care. I spoke with  [name] by phone today.  What matters to the patients health and wellness today?  Management of symptoms    Goals Addressed             This Visit's Progress    Obtain Supportive Resources   On track    Care Coordination Interventions: Mindfulness or Relaxation training provided Active listening / Reflection utilized  Emotional Support Provided Verbalization of feelings encouraged  Patient reports that her dental pain has resolved, since last visit with CC team member. Obtained resources from [place] LCSW and patient discussed natural remedies to assist with aches and pain Patient was diagnosed with arthritis and provided a steroid injection to assist with pain management. She endorses slight decrease in pain, thus far LCSW commended pt for establishing boundaries with others regarding having access to her home during homecoming weekend. Discussed strategies to remain healthy, such as, washing hands, wearing mask, hand sanitizer, etc Pt is looking forward to watching grandpup for the weekend 11/17 Pt endorses feeling better about herself. She has reduced snacking and is learning recipes that will last her longer and maintain blood sugars             SDOH assessments and interventions completed:  No     Care Coordination Interventions Activated:  Yes  Care Coordination Interventions:  Yes, provided   Follow up plan: Follow up call scheduled for 03/07/22    Encounter Outcome:  Pt. Visit Completed   Christa See, MSW, [email or web address] Phone [phone] 12:33 PM

## 2022-02-16 NOTE — Patient Instructions (Signed)
Visit Information  Thank you for taking time to visit with me today. Please don't hesitate to contact me if I can be of assistance to you.   Following are the goals we discussed today:   Goals Addressed             This Visit's Progress    Obtain Supportive Resources   On track    Care Coordination Interventions: Mindfulness or Relaxation training provided Active listening / Reflection utilized  Emotional Support Provided Verbalization of feelings encouraged  Patient reports that her dental pain has resolved, since last visit with CC team member. Obtained resources from [place] LCSW and patient discussed natural remedies to assist with aches and pain Patient was diagnosed with arthritis and provided a steroid injection to assist with pain management. She endorses slight decrease in pain, thus far LCSW commended pt for establishing boundaries with others regarding having access to her home during homecoming weekend. Discussed strategies to remain healthy, such as, washing hands, wearing mask, hand sanitizer, etc Pt is looking forward to watching grandpup for the weekend 11/17 Pt endorses feeling better about herself. She has reduced snacking and is learning recipes that will last her longer and maintain blood sugars             Our next appointment is by telephone on 11/21 at 10 AM  Please call the care guide team at [phone] if you need to cancel or reschedule your appointment.   If you are experiencing a Mental Health or [place] or need someone to talk to, please call the Suicide and Crisis Lifeline: 988 call 911   Patient verbalizes understanding of instructions and care plan provided today and agrees to view in [place]. Active MyChart status and patient understanding of how to access instructions and care plan via MyChart confirmed with patient.     Christa See, MSW, [email or web address] Phone [phone] 12:34 PM

## 2022-03-07 ENCOUNTER — Encounter: Payer: Self-pay | Admitting: Licensed Clinical Social Worker

## 2022-03-08 ENCOUNTER — Telehealth: Payer: Self-pay

## 2022-03-08 NOTE — Progress Notes (Signed)
03-08-2022: contacted patient to reschedule 03-14-2022 appointment per [name]. Patient rescheduled to January.  [place] Pharmacist Assistant [phone]

## 2022-03-13 ENCOUNTER — Ambulatory Visit: Payer: Self-pay

## 2022-03-13 DIAGNOSIS — E119 Type 2 diabetes mellitus without complications: Secondary | ICD-10-CM | POA: Diagnosis not present

## 2022-03-13 DIAGNOSIS — E785 Hyperlipidemia, unspecified: Secondary | ICD-10-CM | POA: Diagnosis not present

## 2022-03-13 DIAGNOSIS — I10 Essential (primary) hypertension: Secondary | ICD-10-CM | POA: Diagnosis not present

## 2022-03-13 NOTE — Patient Instructions (Signed)
Visit Information  Thank you for taking time to visit with me today. Please don't hesitate to contact me if I can be of assistance to you.   Following are the goals we discussed today:   Goals Addressed               This Visit's Progress     Patient Stated     I am having some depression (pt-stated)        Care Coordination Interventions: Evaluation of current treatment plan related to depression and patient's adherence to plan as established by provider Determined patient found the call with LCSW Christa See to be very beneficial for her Discussed patient opted not to celebrate Thanksgiving with her family this year, she was provided a meal by her family and friends Discussed she is experiencing some depression over the loss of her son she lost 15 years ago, he was 56 and developed walking pneumonia  Discussed missed call from [place] on 03/07/22, patient is agreeable to having [facility] reschedule the call to discuss her grief  Sent in basket message to [place] requesting she reschedule her missed call with patient             COMPLETED: I have swelling on my right great toe (pt-stated)        Care Coordination Interventions: Evaluation of current treatment plan related to pain and swelling to great right toe and patient's adherence to plan as established by provider Review of patient status, including review of consultant's reports, relevant laboratory and other test results, and medications completed Assessed for patient understanding of her prescribed regimen to apply ice and elevate her foot as needed Determined patient feels the pain has improved overall, she is able to bare weight on this foot, she is using her cane and walker for balance and support  Instructed patient to report new symptoms or concerns promptly        Our next appointment is by telephone on 04/18/22 at 12 PM  Please call the care guide team at [phone] if you need to cancel or reschedule  your appointment.   If you are experiencing a Mental Health or [place] or need someone to talk to, please call 1-800-273-TALK (toll free, 24 hour hotline)  Patient verbalizes understanding of instructions and care plan provided today and agrees to view in [place]. Active MyChart status and patient understanding of how to access instructions and care plan via MyChart confirmed with patient.     [name], RN, BSN, CCM Care Management Coordinator [facility] Care Management  Direct Phone: [phone]

## 2022-03-13 NOTE — Patient Outreach (Signed)
  Care Coordination   Follow Up Visit Note   03/13/2022 Name: Joanna Sandoval MRN: [id number] DOB: 24-Apr-1945  Joanna Sandoval is a 76 y.o. year old female who sees [name], [place] for primary care. I spoke with  Joanna Sandoval by phone today.  What matters to the patients health and wellness today?  Patient will continue to exercise as directed by her PCP. Patient states she was diagnosed with arthritis in her toe.     Goals Addressed               This Visit's Progress     Patient Stated     I am having some depression (pt-stated)        Care Coordination Interventions: Evaluation of current treatment plan related to depression and patient's adherence to plan as established by provider Determined patient found the call with LCSW Joanna Sandoval to be very beneficial for her Discussed patient opted not to celebrate Thanksgiving with her family this year, she was provided a meal by her family and friends Discussed she is experiencing some depression over the loss of her son she lost 15 years ago, he was 67 and developed walking pneumonia  Discussed missed call from [place] on 03/07/22, patient is agreeable to having [place] reschedule the call to discuss her grief  Sent in basket message to [place] requesting she reschedule her missed call with patient             COMPLETED: I have swelling on my right great toe (pt-stated)        Care Coordination Interventions: Evaluation of current treatment plan related to pain and swelling to great right toe and patient's adherence to plan as established by provider Review of patient status, including review of consultant's reports, relevant laboratory and other test results, and medications completed Assessed for patient understanding of her prescribed regimen to apply ice and elevate her foot as needed Determined patient feels the pain has improved overall, she is able to bare weight on this foot, she is using her cane and walker  for balance and support  Instructed patient to report new symptoms or concerns promptly        SDOH assessments and interventions completed:  No     Care Coordination Interventions:  Yes, provided   Follow up plan: Follow up call scheduled for 04/18/22 '@12'$  PM    Encounter Outcome:  Pt. Visit Completed

## 2022-03-14 ENCOUNTER — Telehealth: Payer: Self-pay

## 2022-03-20 ENCOUNTER — Telehealth: Payer: Self-pay | Admitting: Licensed Clinical Social Worker

## 2022-03-22 NOTE — Patient Outreach (Signed)
  Care Coordination   Follow Up Visit Note   03/22/2022 Name: Joanna Sandoval MRN: [id number] DOB: November 15, 1945  Joanna Sandoval is a 76 y.o. year old female who sees [name], [place] for primary care. I spoke with  [name] by phone today.  What matters to the patients health and wellness today?  Grief Management    Goals Addressed             This Visit's Progress    Obtain Supportive Resources   On track    Care Coordination Interventions: Mindfulness or Relaxation training provided Active listening / Reflection utilized  Emotional Support Provided Verbalization of feelings encouraged  Patient's deceased son's birthday is on Apr 18, 2023. LCSW allowed pt to process feelings Patient is coordinating an intimate celebration of life for the weekend and plans to visit gravesite with family in the spring Validation and encouragement provided Patient endorses strong support from adult son  Patient identified healthy coping skills to assist with emotion regulation               SDOH assessments and interventions completed:  No     Care Coordination Interventions:  Yes, provided   Follow up plan: Follow up call scheduled for 1-2 weeks    Encounter Outcome:  Pt. Visit Completed   Christa See, MSW, [email or web address] Phone [phone] 9:31 AM

## 2022-03-22 NOTE — Patient Instructions (Signed)
Visit Information  Thank you for taking time to visit with me today. Please don't hesitate to contact me if I can be of assistance to you.   Following are the goals we discussed today:   Goals Addressed             This Visit's Progress    Obtain Supportive Resources   On track    Care Coordination Interventions: Mindfulness or Relaxation training provided Active listening / Reflection utilized  Emotional Support Provided Verbalization of feelings encouraged  Patient's deceased son's birthday is on 03-27-2023. LCSW allowed pt to process feelings Patient is coordinating an intimate celebration of life for the weekend and plans to visit gravesite with family in the spring Validation and encouragement provided Patient endorses strong support from adult son  Patient identified healthy coping skills to assist with emotion regulation               Our next appointment is by telephone on 03/27/22 at 3 PM  Please call the care guide team at [phone] if you need to cancel or reschedule your appointment.   If you are experiencing a Mental Health or [place] or need someone to talk to, please call the Suicide and Crisis Lifeline: 988 call 911   Patient verbalizes understanding of instructions and care plan provided today and agrees to view in [place]. Active MyChart status and patient understanding of how to access instructions and care plan via MyChart confirmed with patient.     Christa See, MSW, [email or web address] Phone [phone] 9:31 AM

## 2022-03-27 ENCOUNTER — Ambulatory Visit: Payer: Self-pay | Admitting: Licensed Clinical Social Worker

## 2022-03-28 NOTE — Patient Outreach (Signed)
  Care Coordination Late Entry/Documentation  Follow Up Visit Note   03/28/2022 Name: Joanna Sandoval MRN: [id number] DOB: June 17, 1945  Joanna Sandoval is a 76 y.o. year old female who sees [name], [place] for primary care. I spoke with  [name] by phone today.  What matters to the patients health and wellness today?  Food insecurity/Stress management    Goals Addressed             This Visit's Progress    Obtain Supportive Resources   On track    Care Coordination Interventions: Mindfulness or Relaxation training provided Active listening / Reflection utilized  Emotional Support Provided Verbalization of feelings encouraged  Patient shared increase in stress due to obtaining a fraction of SNAP benefits after recent re-certification. Patient reports anxiety over financial strain, which will be a result, if benefits decrease Patient has made attempts to speak with DSS; however, has been placed on hold for extended periods of time. Pt agreed to try again 03/28/22. LCSW strongly encouraged patient to speak with Case Manager assigned to [facility] benefits Patient continues to make the decision to "choose a positive mindset" to assist with stress management. She continues to receive encouragement from family and friends LCSW informed pt of resources regarding food insecurity, if needed               SDOH assessments and interventions completed:  No     Care Coordination Interventions:  Yes, provided   Follow up plan: Follow up call scheduled for 03/29/22    Encounter Outcome:  Pt. Visit Completed   Christa See, MSW, [email or web address] Phone [phone] 5:59 PM

## 2022-03-28 NOTE — Patient Instructions (Signed)
Visit Information  Thank you for taking time to visit with me today. Please don't hesitate to contact me if I can be of assistance to you.   Following are the goals we discussed today:   Goals Addressed             This Visit's Progress    Obtain Supportive Resources   On track    Care Coordination Interventions: Mindfulness or Relaxation training provided Active listening / Reflection utilized  Emotional Support Provided Verbalization of feelings encouraged  Patient shared increase in stress due to obtaining a fraction of SNAP benefits after recent re-certification. Patient reports anxiety over financial strain, which will be a result, if benefits decrease Patient has made attempts to speak with DSS; however, has been placed on hold for extended periods of time. Pt agreed to try again 03/28/22. LCSW strongly encouraged patient to speak with Case Manager assigned to [facility] benefits Patient continues to make the decision to "choose a positive mindset" to assist with stress management. She continues to receive encouragement from family and friends LCSW informed pt of resources regarding food insecurity, if needed               Our next appointment is by telephone on 03/29/22   Please call the care guide team at [phone] if you need to cancel or reschedule your appointment.   If you are experiencing a Mental Health or [place] or need someone to talk to, please call the Suicide and Crisis Lifeline: 988 call 911   Patient verbalizes understanding of instructions and care plan provided today and agrees to view in [place]. Active MyChart status and patient understanding of how to access instructions and care plan via MyChart confirmed with patient.     Christa See, MSW, [email or web address] Phone [phone] 5:59 PM

## 2022-03-30 ENCOUNTER — Ambulatory Visit: Payer: Self-pay | Admitting: Licensed Clinical Social Worker

## 2022-03-31 NOTE — Patient Outreach (Signed)
  Care Coordination Late Entry/Documentation  Follow Up Visit Note   Outreach completed on 03/30/22 Name: Joanna Sandoval MRN: [id number] DOB: 02-22-1946  Joanna Sandoval is a 76 y.o. year old female who sees Joanna Sandoval, [place] for primary care. I spoke with  Joanna Sandoval by phone today.  What matters to the patients health and wellness today?  SNAP Benefits    Goals Addressed             This Visit's Progress    Obtain Supportive Resources   On track    Care Coordination Interventions: Mindfulness or Relaxation training provided Active listening / Reflection utilized  Emotional Support Provided Verbalization of feelings encouraged  Patient got in contact with DSS, who reports pt's re-certification paperwork was not processed correctly. They plan on depositing $87 next week to pt's account Patient informed she will obtain an increase in SNAP benefits; however, was not told a specific date. Pt will call daily to obtain balance info No additional concerns noted at this time               SDOH assessments and interventions completed:  No     Care Coordination Interventions:  Yes, provided   Follow up plan: Follow up call scheduled for 2-4 weeks    Encounter Outcome:  Pt. Visit Completed   Joanna Sandoval, MSW, [email or web address] Phone [phone] 5:21 PM

## 2022-03-31 NOTE — Patient Instructions (Signed)
Visit Information  Thank you for taking time to visit with me today. Please don't hesitate to contact me if I can be of assistance to you.   Following are the goals we discussed today:   Goals Addressed             This Visit's Progress    Obtain Supportive Resources   On track    Care Coordination Interventions: Mindfulness or Relaxation training provided Active listening / Reflection utilized  Emotional Support Provided Verbalization of feelings encouraged  Patient got in contact with DSS, who reports pt's re-certification paperwork was not processed correctly. They plan on depositing $87 next week to pt's account Patient informed she will obtain an increase in SNAP benefits; however, was not told a specific date. Pt will call daily to obtain balance info No additional concerns noted at this time               Our next appointment is by telephone on 05/15/22 at 3 PM  Please call the care guide team at [phone] if you need to cancel or reschedule your appointment.   If you are experiencing a Mental Health or [place] or need someone to talk to, please call the Suicide and Crisis Lifeline: 988 call 911   Patient verbalizes understanding of instructions and care plan provided today and agrees to view in [place]. Active MyChart status and patient understanding of how to access instructions and care plan via MyChart confirmed with patient.     Joanna Sandoval, MSW, [email or web address] Phone [phone] 5:22 PM

## 2022-04-01 ENCOUNTER — Other Ambulatory Visit: Payer: Self-pay | Admitting: Nurse Practitioner

## 2022-04-01 DIAGNOSIS — E119 Type 2 diabetes mellitus without complications: Secondary | ICD-10-CM

## 2022-04-13 ENCOUNTER — Ambulatory Visit (INDEPENDENT_AMBULATORY_CARE_PROVIDER_SITE_OTHER): Payer: Medicare Other | Admitting: Nurse Practitioner

## 2022-04-13 ENCOUNTER — Encounter: Payer: Self-pay | Admitting: Nurse Practitioner

## 2022-04-13 VITALS — BP 124/82 | HR 92 | Ht 61.0 in | Wt 210.0 lb

## 2022-04-13 DIAGNOSIS — Z79899 Other long term (current) drug therapy: Secondary | ICD-10-CM

## 2022-04-13 DIAGNOSIS — F329 Major depressive disorder, single episode, unspecified: Secondary | ICD-10-CM

## 2022-04-13 DIAGNOSIS — E119 Type 2 diabetes mellitus without complications: Secondary | ICD-10-CM | POA: Diagnosis not present

## 2022-04-13 DIAGNOSIS — E1169 Type 2 diabetes mellitus with other specified complication: Secondary | ICD-10-CM

## 2022-04-13 DIAGNOSIS — I10 Essential (primary) hypertension: Secondary | ICD-10-CM | POA: Diagnosis not present

## 2022-04-13 DIAGNOSIS — Z6839 Body mass index (BMI) 39.0-39.9, adult: Secondary | ICD-10-CM

## 2022-04-13 DIAGNOSIS — E559 Vitamin D deficiency, unspecified: Secondary | ICD-10-CM | POA: Diagnosis not present

## 2022-04-13 DIAGNOSIS — E6609 Other obesity due to excess calories: Secondary | ICD-10-CM

## 2022-04-13 LAB — LIPID PANEL
Chol/HDL Ratio: 3 ratio (ref 0.0–4.4)
Cholesterol, Total: 155 mg/dL (ref 100–199)
HDL: 52 mg/dL (ref >39)
LDL Chol Calc (NIH): 84 mg/dL (ref 0–99)
Triglycerides: 102 mg/dL (ref 0–149)
VLDL Cholesterol Cal: 19 mg/dL (ref 5–40)

## 2022-04-13 LAB — CMP14+EGFR
ALT: 18 IU/L (ref 0–32)
AST: 13 IU/L (ref 0–40)
Albumin/Globulin Ratio: 1.4 (ref 1.2–2.2)
Albumin: 4.2 g/dL (ref 3.8–4.8)
Alkaline Phosphatase: 136 IU/L — ABNORMAL HIGH (ref 44–121)
BUN/Creatinine Ratio: 16 (ref 12–28)
BUN: 16 mg/dL (ref 8–27)
Bilirubin Total: 0.5 mg/dL (ref 0.0–1.2)
CO2: 25 mmol/L (ref 20–29)
Calcium: 9.7 mg/dL (ref 8.7–10.3)
Chloride: 103 mmol/L (ref 96–106)
Creatinine, Ser: 0.97 mg/dL (ref 0.57–1.00)
Globulin, Total: 3 g/dL (ref 1.5–4.5)
Glucose: 67 mg/dL — ABNORMAL LOW (ref 70–99)
Potassium: 4.4 mmol/L (ref 3.5–5.2)
Sodium: 143 mmol/L (ref 134–144)
Total Protein: 7.2 g/dL (ref 6.0–8.5)
eGFR: 61 mL/min/{1.73_m2} (ref >59)

## 2022-04-13 LAB — CBC
Hematocrit: 40.1 % (ref 34.0–46.6)
Hemoglobin: 13.2 g/dL (ref 11.1–15.9)
MCH: 27.7 pg (ref 26.6–33.0)
MCHC: 32.9 g/dL (ref 31.5–35.7)
MCV: 84 fL (ref 79–97)
Platelets: 301 10*3/uL (ref 150–450)
RBC: 4.77 x10E6/uL (ref 3.77–5.28)
RDW: 18.8 % — ABNORMAL HIGH (ref 11.7–15.4)
WBC: 12.4 10*3/uL — ABNORMAL HIGH (ref 3.4–10.8)

## 2022-04-13 LAB — HEMOGLOBIN A1C
Est. average glucose Bld gHb Est-mCnc: 157 mg/dL
Hgb A1c MFr Bld: 7.1 % — ABNORMAL HIGH (ref 4.8–5.6)

## 2022-04-13 NOTE — Progress Notes (Signed)
Virtual Visit via telephone   This visit type was conducted due to national recommendations for restrictions regarding the COVID-19 Pandemic (e.g. social distancing) in an effort to limit this patient's exposure and mitigate transmission in our community.  Due to her co-morbid illnesses, this patient is at least at moderate risk for complications without adequate follow up.  This format is felt to be most appropriate for this patient at this time.  All issues noted in this document were discussed and addressed.  A limited physical exam was performed with this format.    This visit type was conducted due to national recommendations for restrictions regarding the COVID-19 Pandemic (e.g. social distancing) in an effort to limit this patient's exposure and mitigate transmission in our community.  Patients identity confirmed using two different identifiers.  This format is felt to be most appropriate for this patient at this time.  All issues noted in this document were discussed and addressed.  No physical exam was performed (except for noted visual exam findings with telephone Visits).    Date:  04/13/2022   ID:  Joanna Sandoval, DOB Jul 31, 1945, MRN [id number]  Patient Location:  Car - spoke with Joanna Sandoval  Provider location:   Office    Chief Complaint:  diabetes f/u  History of Present Illness:    Joanna Sandoval is a 76 y.o. female who presents via video conferencing for a telehealth visit today.    The patient does not have symptoms concerning for COVID-19 infection (fever, chills, cough, or new shortness of breath).   Telephone visit for f/u diabetes and blood pressure.   Hypertension This is a chronic problem. The current episode started more than 1 year ago. The problem is unchanged. The problem is controlled. Pertinent negatives include no anxiety, headaches or shortness of breath. There are no associated agents to hypertension. Risk factors for coronary artery disease  include obesity and sedentary lifestyle. Past treatments include angiotensin blockers. Compliance problems include exercise.  There is no history of angina. There is no history of chronic renal disease.  Diabetes She presents for her follow-up diabetic visit. She has type 2 diabetes mellitus. Her disease course has been stable. Pertinent negatives for hypoglycemia include no headaches.     Past Medical History:  Diagnosis Date   Arthritis    Diabetes mellitus    Hypercholesteremia    Hypertension    Post-menopausal bleeding    Postmenopausal bleeding 08/30/2011   Normal EBX 09/2011.   Past Surgical History:  Procedure Laterality Date   CATARACT EXTRACTION     DILATION AND CURETTAGE OF UTERUS     HYSTEROSCOPY WITH D & C N/A 05/02/2017   Procedure: DILATATION AND CURETTAGE /HYSTEROSCOPY;  Surgeon: Mora Bellman, MD;  Location: [facility];  Service: Gynecology;  Laterality: N/A;   KNEE ARTHROSCOPY       Current Meds  Medication Sig   Accu-Chek Softclix Lancets lancets USE AS DIRECTED   albuterol (PROVENTIL HFA;VENTOLIN HFA) 108 (90 Base) MCG/ACT inhaler Inhale 2 puffs into the lungs every 6 (six) hours as needed.   atenolol (TENORMIN) 100 MG tablet Take 50 mg by mouth daily.    atorvastatin (LIPITOR) 20 MG tablet Take 1 tablet (20 mg total) by mouth daily.   Blood Glucose Monitoring Suppl (ACCU-CHEK GUIDE) w/Device KIT USE TO CHECK BLOOD SUGAR  TWICE DAILY AT 10AM AND 5PM   D3-50 50000 units capsule    diclofenac sodium (VOLTAREN) 1 % GEL Apply 2 g topically 4 (four)  times daily.   FARXIGA 10 MG TABS tablet TAKE 1 TABLET BY MOUTH DAILY  BEFORE BREAKFAST   glimepiride (AMARYL) 2 MG tablet TAKE 1 TABLET BY MOUTH IN THE  MORNING AND AT BEDTIME   glucose blood (ACCU-CHEK GUIDE) test strip Use as instructed   [name] KIT Use to test blood sugar twice daily as directed. E11.65   latanoprost (XALATAN) 0.005 % ophthalmic solution 1 drop at  bedtime.   losartan (COZAAR) 50 MG tablet Take 50 mg by mouth daily.   magic mouthwash w/lidocaine SOLN Take 5 mLs by mouth 3 (three) times daily. 1 part each solution   ondansetron (ZOFRAN ODT) 4 MG disintegrating tablet Take 1 tablet (4 mg total) by mouth every 8 (eight) hours as needed for nausea or vomiting.   RYBELSUS 7 MG TABS TAKE 1 TABLET BY MOUTH DAILY     Allergies:   Patient has no known allergies.   Social History   Tobacco Use   Smoking status: Former    Types: Cigarettes   Smokeless tobacco: Never  Vaping Use   Vaping Use: Never used  Substance Use Topics   Alcohol use: No   Drug use: No     Family Hx: The patient's family history includes Cancer in her father; Diabetes in her sister; Hypertension in her father and mother.  ROS:   Please see the history of present illness.    Review of Systems  Constitutional: Negative.   HENT: Negative.    Eyes: Negative.   Respiratory: Negative.  Negative for shortness of breath.   Cardiovascular: Negative.   Gastrointestinal: Negative.   Genitourinary: Negative.   Musculoskeletal: Negative.   Skin: Negative.   Neurological: Negative.  Negative for headaches.  Endo/Heme/Allergies: Negative.   Psychiatric/Behavioral: Negative.      All other systems reviewed and are negative.   Labs/Other Tests and Data Reviewed:    Recent Labs: 08/09/2021: ALT 9; Hemoglobin 13.6; Platelets 302 12/12/2021: BUN 14; Creatinine, Ser 1.04; Potassium 4.3; Sodium 143   Recent Lipid Panel Lab Results  Component Value Date/Time   CHOL 145 08/09/2021 10:18 AM   TRIG 108 08/09/2021 10:18 AM   HDL 52 08/09/2021 10:18 AM   CHOLHDL 2.8 08/09/2021 10:18 AM   CHOLHDL 4.6 10/12/2006 08:40 AM   LDLCALC 73 08/09/2021 10:18 AM    Wt Readings from Last 3 Encounters:  04/13/22 210 lb (95.3 kg)  02/06/22 195 lb (88.5 kg)  01/26/22 197 lb 9.6 oz (89.6 kg)     Exam:    Vital Signs:  BP 124/82   Pulse 92   Ht _0  (1.549 m)   Wt 210 lb  (95.3 kg)   BMI 39.68 kg/m     Physical Exam Vitals reviewed.  Constitutional:      General: She is not in acute distress.    Appearance: Normal appearance. She is obese.  Cardiovascular:     Rate and Rhythm: Normal rate and regular rhythm.     Pulses: Normal pulses.     Heart sounds: Normal heart sounds. No murmur heard. Pulmonary:     Effort: Pulmonary effort is normal. No respiratory distress.     Breath sounds: No wheezing.  Neurological:     General: No focal deficit present.     Mental Status: She is alert and oriented to person, place, and time. Mental status is at baseline.     Cranial Nerves: No cranial nerve deficit.  Psychiatric:  Mood and Affect: Mood and affect normal.        Behavior: Behavior normal.        Thought Content: Thought content normal.        Cognition and Memory: Memory normal.        Judgment: Judgment normal.     ASSESSMENT & PLAN:     1. Essential hypertension Blood pressure is fairly controlled. Encouraged to focus on lifestyle changes and stay well hydrated with water.   2. Type 2 diabetes mellitus without complication, without long-term current use of insulin ([place]) HgbA1c improved at last visit, continue current medications.  3. Vitamin D deficiency Will check vitamin D level and supplement as needed.    Also encouraged to spend 15 minutes in the sun daily.   4. Reactive depression Overall she is doing well.   5. Class 2 obesity due to excess calories with body mass index (BMI) of 39.0 to 39.9 in adult, unspecified whether serious comorbidity present She is encouraged to strive for BMI less than 30 to decrease cardiac risk. Advised to aim for at least 150 minutes of exercise per week.     COVID-19 Education: The signs and symptoms of COVID-19 were discussed with the patient and how to seek care for testing (follow up with PCP or arrange E-visit).  The importance of social distancing was discussed today.  Patient Risk:    After full review of this patients clinical status, I feel that they are at least moderate risk at this time.  Time:   Today, I have spent 9.22 minutes/ seconds with the patient with telehealth technology discussing above diagnoses.     Medication Adjustments/Labs and Tests Ordered: Current medicines are reviewed at length with the patient today.  Concerns regarding medicines are outlined above.   Tests Ordered: Orders Placed This Encounter  Procedures   CMP14+EGFR   CBC   Hemoglobin A1c   Lipid panel    Medication Changes: No orders of the defined types were placed in this encounter.   Disposition:  Follow up in 4 month(s)  Signed, [name], FNP

## 2022-04-13 NOTE — Patient Instructions (Signed)

## 2022-04-18 ENCOUNTER — Ambulatory Visit: Payer: Self-pay

## 2022-04-18 NOTE — Patient Outreach (Signed)
  Care Coordination   Follow Up Visit Note   04/18/2022 Name: Joanna Sandoval MRN: [id number] DOB: 12/06/1945  Joanna Sandoval is a 77 y.o. year old female who sees [name], [place] for primary care. I spoke with  Joanna Sandoval by phone today.  What matters to the patients health and wellness today?  Patient is experiencing tooth pain, headache and facial pain. She would like to work on lowering her LDL.     Goals Addressed               This Visit's Progress     Patient Stated     I need to see a dentist (pt-stated)        Care Coordination Interventions: Evaluation of current treatment plan related to dental pain and patient's adherence to plan as established by provider Determined patient completed her antibiotics in October as directed with good results as her tooth pain subsided  Discussed today she is experiencing reoccurring tooth pain along with headache and facial pain on the left side, patient denies having other symptoms Discussed patient has the contact number for a dentist in [place], Dr. Quincy Simmonds, [place] recommended by her sister Determined patient will contact this dentist today to schedule an appointment Educated patient on the use of moist heat for comfort  Sent in basket message to PCP [name] FNP with a patient status update and request for recommendations Determined PCP is recommending patient f/u with a dentist for treatment of symptoms, patient is advised and verbalizes understanding        To lower LDL (pt-stated)        Care Coordination Interventions: Provider established cholesterol goals reviewed Reviewed importance of limiting foods high in cholesterol Reviewed exercise goals and target of 150 minutes per week Mailed printed educational materials related to lowering LDL           SDOH assessments and interventions completed:  No     Care Coordination Interventions:  Yes, provided   Follow up plan: Follow up call scheduled for  05/02/22 '@11'$ :30 AM    Encounter Outcome:  Pt. Visit Completed

## 2022-04-18 NOTE — Patient Instructions (Signed)
Visit Information  Thank you for taking time to visit with me today. Please don't hesitate to contact me if I can be of assistance to you.   Following are the goals we discussed today:   Goals Addressed               This Visit's Progress     Patient Stated     I need to see a dentist (pt-stated)        Care Coordination Interventions: Evaluation of current treatment plan related to dental pain and patient's adherence to plan as established by provider Determined patient completed her antibiotics in October as directed with good results as her tooth pain subsided  Discussed today she is experiencing reoccurring tooth pain along with headache and facial pain on the left side, patient denies having other symptoms Discussed patient has the contact number for a dentist in [place], Dr. Quincy Simmonds, [place] recommended by her sister Determined patient will contact this dentist today to schedule an appointment Educated patient on the use of moist heat for comfort  Sent in basket message to PCP [name] FNP with a patient status update and request for recommendations Determined PCP is recommending patient f/u with a dentist for treatment of symptoms, patient is advised and verbalizes understanding        To lower LDL (pt-stated)        Care Coordination Interventions: Provider established cholesterol goals reviewed Reviewed importance of limiting foods high in cholesterol Reviewed exercise goals and target of 150 minutes per week Mailed printed educational materials related to lowering LDL           Our next appointment is by telephone on 05/02/22 at 11:30 AM  Please call the care guide team at [phone] if you need to cancel or reschedule your appointment.   If you are experiencing a Mental Health or [place] or need someone to talk to, please call 1-800-273-TALK (toll free, 24 hour hotline)  Patient verbalizes understanding of instructions and care plan  provided today and agrees to view in [place]. Active MyChart status and patient understanding of how to access instructions and care plan via MyChart confirmed with patient.     [name], RN, BSN, CCM Care Management Coordinator [facility] Care Management Direct Phone: [phone]

## 2022-04-28 ENCOUNTER — Telehealth: Payer: Self-pay

## 2022-04-28 NOTE — Progress Notes (Cosign Needed)
   Joanna Sandoval was reminded to have all medications, supplements and any blood glucose and blood pressure readings available for review with [name], Pharm. D, at her telephone visit on 01-17-*2024 at 9:00.    [place] Pharmacist Assistant [phone]

## 2022-05-02 ENCOUNTER — Ambulatory Visit: Payer: Self-pay

## 2022-05-02 NOTE — Patient Instructions (Signed)
Visit Information  Thank you for taking time to visit with me today. Please don't hesitate to contact me if I can be of assistance to you.   Following are the goals we discussed today:   Goals Addressed               This Visit's Progress     Patient Stated     I am having some depression (pt-stated)        Care Coordination Interventions: Evaluation of current treatment plan related to depression and patient's adherence to plan as established by provider Reviewed scheduled/upcoming provider appointment with Christa See LCSW re: counseling for depression         I need to see a dentist (pt-stated)        Care Coordination Interventions: Evaluation of current treatment plan related to dental pain and patient's adherence to plan as established by provider Determined patient completed a dental exam to discuss treatment plan for dental decay and tooth extraction Determined patient was advised this dentist will not be able to complete her extractions Provided patient with the contact name/number for an oral surgeon who accepts her insurance, patient will call for an appointment         Our next appointment is by telephone on 05/30/22 at 11:00 AM  Please call the care guide team at [phone] if you need to cancel or reschedule your appointment.   If you are experiencing a Mental Health or [place] or need someone to talk to, please go to [facility] [address], [place] [phone])  Patient verbalizes understanding of instructions and care plan provided today and agrees to view in [place]. Active MyChart status and patient understanding of how to access instructions and care plan via MyChart confirmed with patient.     [name], RN, BSN, CCM Care Management Coordinator [facility] Care Management Direct Phone: [phone]

## 2022-05-02 NOTE — Patient Outreach (Signed)
  Care Coordination   Follow Up Visit Note   05/02/2022 Name: Adilyn Humes MRN: [id number] DOB: 03-13-1946  Anetria Harwick is a 77 y.o. year old female who sees [name], [place] for primary care. I spoke with  [name] by phone today.  What matters to the patients health and wellness today?  Patient will call the oral surgeon to schedule a consultation for tooth extraction.     Goals Addressed               This Visit's Progress     Patient Stated     I am having some depression (pt-stated)        Care Coordination Interventions: Evaluation of current treatment plan related to depression and patient's adherence to plan as established by provider Reviewed scheduled/upcoming provider appointment with Christa See LCSW re: counseling for depression         I need to see a dentist (pt-stated)        Care Coordination Interventions: Evaluation of current treatment plan related to dental pain and patient's adherence to plan as established by provider Determined patient completed a dental exam to discuss treatment plan for dental decay and tooth extraction Determined patient was advised this dentist will not be able to complete her extractions Provided patient with the contact name/number for an oral surgeon who accepts her insurance, patient will call for an appointment         SDOH assessments and interventions completed:  No     Care Coordination Interventions:  Yes, provided   Follow up plan: Follow up call scheduled for 05/30/22 '@11'$ :00 AM    Encounter Outcome:  Pt. Visit Completed

## 2022-05-03 ENCOUNTER — Ambulatory Visit: Payer: Medicare Other

## 2022-05-03 NOTE — Progress Notes (Signed)
Care Management & Coordination Services Pharmacy Note  05/03/2022 Name:  Joanna Sandoval MRN:  [id number] DOB:  [birth year]  Summary: Patient reports that she is doing okay but now she is concerned about the trembles. She is concerned that there might something really wrong.   Recommendations/Changes made from today's visit: Recommend patient continue current medication regimen Recommend patient have a visit completed with NP, due to her concerns about shaking.   Follow up plan: Ms. Talford is going to continue her curremt medication regimen.  She reports that she does not want to go to the doctor early .    Subjective: Joanna Sandoval is an 77 y.o. year old female who is a primary patient of [name], [place].  The care coordination team was consulted for assistance with disease management and care coordination needs.    Engaged with patient by telephone for follow up visit.  Recent office visits: 04/03/2022 PCP OV 02/06/2022 PCP OV Recent consult visits: 02/10/2022 [facility] visits: None in previous 6 months   Objective:  Lab Results  Component Value Date   CREATININE 0.97 04/13/2022   BUN 16 04/13/2022   EGFR 61 04/13/2022   GFRNONAA >60 11/29/2020   GFRAA 70 01/29/2020   NA 143 04/13/2022   K 4.4 04/13/2022   CALCIUM 9.7 04/13/2022   CO2 25 04/13/2022   GLUCOSE 67 (L) 04/13/2022    Lab Results  Component Value Date/Time   HGBA1C 7.1 (H) 04/13/2022 09:00 AM   HGBA1C 7.2 (H) 12/12/2021 10:21 AM   MICROALBUR 30 08/04/2020 09:46 AM   MICROALBUR 30 07/30/2019 10:58 AM    Last diabetic Eye exam:  Lab Results  Component Value Date/Time   HMDIABEYEEXA No Retinopathy 10/12/2020 12:00 AM    Last diabetic Foot exam: No results found for: "HMDIABFOOTEX"   Lab Results  Component Value Date   CHOL 155 04/13/2022   HDL 52 04/13/2022   LDLCALC 84 04/13/2022   TRIG 102 04/13/2022   CHOLHDL 3.0 04/13/2022       Latest Ref Rng & Units 04/13/2022     9:00 AM 08/09/2021   10:18 AM 05/23/2021    9:49 AM  Hepatic Function  Total Protein 6.0 - 8.5 g/dL 7.2  7.6  7.0   Albumin 3.8 - 4.8 g/dL 4.2  4.3  4.2   AST 0 - 40 IU/L '13  12  13   '$ ALT 0 - 32 IU/L '18  9  11   '$ Alk Phosphatase 44 - 121 IU/L 136  130  138   Total Bilirubin 0.0 - 1.2 mg/dL 0.5  0.5  0.6     Lab Results  Component Value Date/Time   TSH 0.721 01/29/2020 09:35 AM   TSH 0.490 07/30/2019 10:59 AM       Latest Ref Rng & Units 04/13/2022    9:00 AM 08/09/2021   10:18 AM 12/23/2020   12:47 PM  CBC  WBC 3.4 - 10.8 x10E3/uL 12.4  12.7  13.3   Hemoglobin 11.1 - 15.9 g/dL 13.2  13.6  12.8   Hematocrit 34.0 - 46.6 % 40.1  42.3  40.2   Platelets 150 - 450 x10E3/uL 301  302  268     Lab Results  Component Value Date/Time   VD25OH 41.2 11/08/2020 09:13 AM   VD25OH 27.7 (L) 08/04/2020 10:13 AM    Clinical ASCVD: No  The 10-year ASCVD risk score (Arnett DK, et al., 2019) is: 25.3%   Values used to calculate  the score:     Age: 77 years     Sex: Female     Is Non-Hispanic African American: Yes     Diabetic: Yes     Tobacco smoker: No     Systolic Blood Pressure: 161 mmHg     Is BP treated: Yes     HDL Cholesterol: 52 mg/dL     Total Cholesterol: 155 mg/dL        04/13/2022    8:25 AM 01/26/2022    8:50 AM 12/12/2021    9:11 AM  Depression screen PHQ 2/9  Decreased Interest 0 0 0  Down, Depressed, Hopeless 0 0 3  PHQ - 2 Score 0 0 3  Altered sleeping 0  1  Tired, decreased energy 0  1  Change in appetite 0  0  Feeling bad or failure about yourself  0  0  Trouble concentrating 0  1  Moving slowly or fidgety/restless 0  1  Suicidal thoughts 0  0  PHQ-9 Score 0  7  Difficult doing work/chores Not difficult at all       Social History   Tobacco Use  Smoking Status Former   Types: Cigarettes  Smokeless Tobacco Never   BP Readings from Last 3 Encounters:  04/13/22 124/82  02/06/22 138/70  01/26/22 130/80   Pulse Readings from Last 3 Encounters:   04/13/22 92  02/06/22 91  01/26/22 84   Wt Readings from Last 3 Encounters:  04/13/22 210 lb (95.3 kg)  02/06/22 195 lb (88.5 kg)  01/26/22 197 lb 9.6 oz (89.6 kg)   BMI Readings from Last 3 Encounters:  04/13/22 39.68 kg/m  02/06/22 36.84 kg/m  01/26/22 37.34 kg/m    No Known Allergies  Medications Reviewed Today     Reviewed by [name], RN (Registered Nurse) on 05/02/22 at 1156  Med List Status: <None>   Medication Order Taking? Sig Documenting Provider Last Dose Status Informant  Accu-Chek Softclix Lancets lancets [id number] No USE AS DIRECTED [name], FNP Taking Active   albuterol (PROVENTIL HFA;VENTOLIN HFA) 108 (90 Base) MCG/ACT inhaler [id number] No Inhale 2 puffs into the lungs every 6 (six) hours as needed. [provider] Taking Active   atenolol (TENORMIN) 100 MG tablet [id number] No Take 50 mg by mouth daily.  [provider] Taking Active   atorvastatin (LIPITOR) 20 MG tablet [id number] No Take 1 tablet (20 mg total) by mouth daily. [name], FNP Taking Expired 04/13/22 2359   Blood Glucose Monitoring Suppl (ACCU-CHEK GUIDE) w/Device KIT [id number] No USE TO CHECK BLOOD SUGAR  TWICE DAILY AT 10AM AND 5PM [name], FNP Taking Active   D3-50 50000 units capsule [id number] No  [provider] Taking Active   diclofenac sodium (VOLTAREN) 1 % GEL [id number] No Apply 2 g topically 4 (four) times daily. [name], FNP Taking Active   FARXIGA 10 MG TABS tablet [id number] No TAKE 1 TABLET BY MOUTH DAILY  BEFORE Evelena Peat, FNP Taking Active   glimepiride (AMARYL) 2 MG tablet [id number] No TAKE 1 TABLET BY MOUTH IN THE  MORNING AND AT BEDTIME [name], FNP Taking Active   glucose blood (ACCU-CHEK GUIDE) test strip [id number] No Use as instructed [name], FNP Taking Active   [name] KIT [id number] No Use to test blood sugar twice daily as directed. E11.65 [name], FNP  Taking Active   latanoprost (XALATAN) 0.005 % ophthalmic solution [id number] No 1 drop at bedtime.  [provider] Taking Active   losartan (COZAAR) 50 MG tablet [id number] No Take 50 mg by mouth daily. [provider] Taking Active   magic mouthwash w/lidocaine SOLN [id number] No Take 5 mLs by mouth 3 (three) times daily. 1 part each solution [name], FNP Taking Active   ondansetron (ZOFRAN ODT) 4 MG disintegrating tablet [id number] No Take 1 tablet (4 mg total) by mouth every 8 (eight) hours as needed for nausea or vomiting. Eustaquio Maize, PA-C Taking Active   RYBELSUS 7 MG TABS [id number] No TAKE 1 TABLET BY MOUTH DAILY [name], FNP Taking Active             SDOH:  (Social Determinants of Health) assessments and interventions performed: No SDOH Interventions    Flowsheet Row Care Coordination from 01/31/2022 in [place] from 12/14/2021 in [place] from 09/08/2021 in [facility] Chronic Care Management from 08/16/2021 in [facility] Chronic Care Management from 02/16/2021 in [facility] Chronic Care Management from 12/28/2020 in [facility]  SDOH Interventions        Food Insecurity Interventions -- -- -- Other (Comment)  [provided patient with a list of food pantry sites. Pt already receiving FNS] -- --  Housing Interventions -- -- -- Intervention Not Indicated Intervention Not Indicated --  Transportation Interventions Other (Comment)  [patient applied for [place]AT, however will hold off on pursuing this provider for transportation at this time, she will continue to use a taxi] Other (Comment)  [SW referral sent] -- Intervention Not Indicated Intervention Not Indicated --  Financial Strain Interventions -- -- --  [refer to care guide team] Other (Comment)   [Resources provided for food pantry and dental clinics. Discussed opportunity to apply for Medicaid- app provided] -- Other (Comment)  Physical Activity Interventions -- -- -- -- Other (Comments)  [Educated patient on recommendations for 150 minutes weekly] --  Social Connections Interventions -- -- -- -- Intervention Not Indicated --      SDOH Screenings   Food Insecurity: No Food Insecurity (09/08/2021)  Recent Concern: [place] Present (08/16/2021)  Housing: Low Risk  (08/16/2021)  Transportation Needs: Unmet Transportation Needs (01/31/2022)  Alcohol Screen: Low Risk  (02/16/2021)  Depression (PHQ2-9): Low Risk  (04/13/2022)  Financial Resource Strain: High Risk (09/08/2021)  Physical Activity: Sufficiently Active (09/08/2021)  Social Connections: Moderately Isolated (02/16/2021)  Stress: No Stress Concern Present (09/08/2021)  Tobacco Use: Medium Risk (04/13/2022)    Medication Assistance: None required.  Patient affirms current coverage meets needs.  Medication Access: Within the past 30 days, how often has patient missed a dose of medication? She has not missed any of her medications  Is a pillbox or other method used to improve adherence? Yes  Factors that may affect medication adherence? transportation problems Are meds synced by current pharmacy? No  Are meds delivered by current pharmacy? Yes  Does patient experience delays in picking up medications due to transportation concerns? No   Upstream Services Reviewed: Is patient disadvantaged to use [facility] Pharm[facility]?: No  Current Rx insurance plan: [facility] Medicare  Name and location of Current pharmacy:  [facility] [place], [place] [address] [place] [address] Phone: [phone] Fax: [phone]  [facility] ([place], [place]  [address] [place] [address] Phone: [phone] Fax: [place], [place] [address] Phone: [phone] Fax: [phone]  [facility] services reviewed with patient today?: No  Patient requests to transfer care to Up[facility] Pharmacy?:  Not applicable  Reason patient declined to change pharmacies: Not mentioned at this visit  Compliance/Adherence/Medication fill history: Care Gaps: Ophthalmology Exam - 05/10/2022 Eye exam with Dr. Mauricio Po Drugs: Wilder Glade 10 mg tablet  Glimepiride 2 mg tablet  Losartan 50 mg tablet  Rybelsus 7 mg tablet    Assessment/Plan   Diabetes (A1c goal <8%) -Controlled -Current medications: Farxiga 10 mg tablet once per day Appropriate, Effective, Safe, Accessible Rybelsys 7 mg tablet daily Appropriate, Effective, Safe, Accessible Glimepiride 2 mg tablet in the morning and at bedtime. Appropriate, Effective, Safe,  -Current home glucose readings: she reports that her BS readings are fluctuating fasting glucose: 130 -Denies hypoglycemic/hyperglycemic symptoms -Current meal patterns:  dinner: chicken pot pie homemade, that she made  drinks: two bottles of water  -Current exercise: she does exercise in the house because it is so cold, so she walks around the house.  -Educated on Complications of diabetes including kidney damage, retinal damage, and cardiovascular disease; Prevention and management of hypoglycemic episodes; -Counseled to check feet daily and get yearly eye exams, she will be seeing Dr. Katy Fitch on 05/10/2022 -Recommended to continue current medication  [name], CPP, PharmD Clinical Pharmacist Practitioner [facility] [phone]

## 2022-05-10 DIAGNOSIS — H02834 Dermatochalasis of left upper eyelid: Secondary | ICD-10-CM | POA: Diagnosis not present

## 2022-05-10 DIAGNOSIS — Q141 Congenital malformation of retina: Secondary | ICD-10-CM | POA: Diagnosis not present

## 2022-05-10 DIAGNOSIS — Z961 Presence of intraocular lens: Secondary | ICD-10-CM | POA: Diagnosis not present

## 2022-05-10 DIAGNOSIS — E119 Type 2 diabetes mellitus without complications: Secondary | ICD-10-CM | POA: Diagnosis not present

## 2022-05-10 DIAGNOSIS — H02831 Dermatochalasis of right upper eyelid: Secondary | ICD-10-CM | POA: Diagnosis not present

## 2022-05-10 DIAGNOSIS — H401131 Primary open-angle glaucoma, bilateral, mild stage: Secondary | ICD-10-CM | POA: Diagnosis not present

## 2022-05-10 LAB — HM DIABETES EYE EXAM

## 2022-05-12 ENCOUNTER — Telehealth: Payer: Self-pay

## 2022-05-15 ENCOUNTER — Telehealth: Payer: Self-pay | Admitting: Licensed Clinical Social Worker

## 2022-05-15 ENCOUNTER — Encounter: Payer: Self-pay | Admitting: Licensed Clinical Social Worker

## 2022-05-15 NOTE — Patient Outreach (Signed)
  Care Coordination   05/15/2022 Name: Laquinta Hazell MRN: [id number] DOB: 1946-03-29   Care Coordination Outreach Attempts:  An unsuccessful telephone outreach was attempted for a scheduled appointment today.  Follow Up Plan:  Additional outreach attempts will be made to offer the patient care coordination information and services.   Encounter Outcome:  No Answer   Care Coordination Interventions:  No, not indicated    Christa See, MSW, [email or web address] Phone [phone] 4:33 PM

## 2022-05-22 ENCOUNTER — Ambulatory Visit (INDEPENDENT_AMBULATORY_CARE_PROVIDER_SITE_OTHER): Payer: Medicare Other | Admitting: Nurse Practitioner

## 2022-05-22 VITALS — BP 118/68 | HR 88 | Temp 98.3°F | Ht 61.0 in | Wt 195.0 lb

## 2022-05-22 DIAGNOSIS — N6321 Unspecified lump in the left breast, upper outer quadrant: Secondary | ICD-10-CM

## 2022-05-22 DIAGNOSIS — N644 Mastodynia: Secondary | ICD-10-CM | POA: Diagnosis not present

## 2022-05-22 DIAGNOSIS — R195 Other fecal abnormalities: Secondary | ICD-10-CM | POA: Insufficient documentation

## 2022-05-22 DIAGNOSIS — N63 Unspecified lump in unspecified breast: Secondary | ICD-10-CM

## 2022-05-22 NOTE — Progress Notes (Unsigned)
I,Tianna Badgett,acting as a Education administrator for [employer], FNP.,have documented all relevant documentation on the behalf of [name], FNP,as directed by  [name], FNP while in the presence of [name], [place].  Subjective:     Patient ID: Joanna Sandoval , female    DOB: 29-Jun-1945 , 77 y.o.   MRN: [id number]   Chief Complaint  Patient presents with   Breast Pain    HPI  She is having left breast pain started on Thursday. She also has vein prominence to her left breast. Denies any discharge. No medications.   She is having pain to her left arm as well.   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      Past Medical History:  Diagnosis Date   Arthritis    Diabetes mellitus    Hypercholesteremia    Hypertension    Post-menopausal bleeding    Postmenopausal bleeding 08/30/2011   Normal EBX 09/2011.     Family History  Problem Relation Age of Onset   Hypertension Mother    Hypertension Father    Cancer Father        ?lung   Diabetes Sister      Current Outpatient Medications:    Accu-Chek Softclix Lancets lancets, USE AS DIRECTED, Disp: 200 each, Rfl: 3   albuterol (PROVENTIL HFA;VENTOLIN HFA) 108 (90 Base) MCG/ACT inhaler, Inhale 2 puffs into the lungs every 6 (six) hours as needed., Disp: , Rfl:    atenolol (TENORMIN) 100 MG tablet, Take 50 mg by mouth daily. , Disp: , Rfl:    atorvastatin (LIPITOR) 20 MG tablet, Take 1 tablet (20 mg total) by mouth daily., Disp: 90 tablet, Rfl: 1   Blood Glucose Monitoring Suppl (ACCU-CHEK GUIDE) w/Device KIT, USE TO CHECK BLOOD SUGAR  TWICE DAILY AT 10AM AND 5PM, Disp: 1 kit, Rfl: 1   D3-50 50000 units capsule, , Disp: , Rfl: 0   diclofenac sodium (VOLTAREN) 1 % GEL, Apply 2 g topically 4 (four) times daily., Disp: 100 g, Rfl: 1   FARXIGA 10 MG TABS tablet, TAKE 1 TABLET BY MOUTH DAILY  BEFORE BREAKFAST, Disp: 100 tablet, Rfl: 2   glimepiride (AMARYL) 2 MG tablet, TAKE 1  TABLET BY MOUTH IN THE  MORNING AND AT BEDTIME, Disp: 200 tablet, Rfl: 2   glucose blood (ACCU-CHEK GUIDE) test strip, Use as instructed, Disp: 100 each, Rfl: 12   [name] KIT, Use to test blood sugar twice daily as directed. E11.65, Disp: 1 kit, Rfl: 12   latanoprost (XALATAN) 0.005 % ophthalmic solution, 1 drop at bedtime., Disp: , Rfl:    losartan (COZAAR) 50 MG tablet, Take 50 mg by mouth daily., Disp: , Rfl:    magic mouthwash w/lidocaine SOLN, Take 5 mLs by mouth 3 (three) times daily. 1 part each solution, Disp: 120 mL, Rfl: 0   ondansetron (ZOFRAN ODT) 4 MG disintegrating tablet, Take 1 tablet (4 mg total) by mouth every 8 (eight) hours as needed for nausea or vomiting., Disp: 20 tablet, Rfl: 0   RYBELSUS 7 MG TABS, TAKE 1 TABLET BY MOUTH DAILY, Disp: 90 tablet, Rfl: 3   No Known Allergies   Review of Systems  Constitutional: Negative.   Respiratory: Negative.    Cardiovascular: Negative.   Genitourinary:        Left breast pain   Neurological: Negative.   Psychiatric/Behavioral: Negative.  Today's Vitals   05/22/22 0951  BP: 118/68  Pulse: 88  Temp: 98.3 F (36.8 C)  TempSrc: Oral  Weight: 195 lb (88.5 kg)  Height: '5\' 1"'$  (1.549 m)   Body mass index is 36.84 kg/m.   Objective:  Physical Exam Vitals reviewed.  Pulmonary:     Effort: Pulmonary effort is normal. No respiratory distress.  Chest:     Chest wall: No mass.  Breasts:    Left: Mass (left inner at 2 o'clock) and tenderness present.    Lymphadenopathy:     Upper Body:     Left upper body: Pectoral adenopathy present.         Assessment And Plan:     1. Breast pain, left Comments: Tenderness on palpation to breast - MM Digital Diagnostic Unilat L; Future  2. Breast nodule Comments: Small nodule palpated at 2 o'clock inner breast on left, will order diagnostic mammogram     Patient was given opportunity to ask questions. Patient verbalized understanding  of the plan and was able to repeat key elements of the plan. All questions were answered to their satisfaction.  [name], FNP   I, [name], FNP, have reviewed all documentation for this visit. The documentation on 05/22/22 for the exam, diagnosis, procedures, and orders are all accurate and complete.   IF YOU HAVE BEEN REFERRED TO A SPECIALIST, IT MAY TAKE 1-2 WEEKS TO SCHEDULE/PROCESS THE REFERRAL. IF YOU HAVE NOT HEARD FROM US/SPECIALIST IN TWO WEEKS, PLEASE GIVE [place] A CALL AT [phone].   THE PATIENT IS ENCOURAGED TO PRACTICE SOCIAL DISTANCING DUE TO THE COVID-19 PANDEMIC.

## 2022-05-24 ENCOUNTER — Encounter: Payer: Self-pay | Admitting: Nurse Practitioner

## 2022-05-24 ENCOUNTER — Other Ambulatory Visit: Payer: Self-pay | Admitting: Nurse Practitioner

## 2022-05-24 DIAGNOSIS — N644 Mastodynia: Secondary | ICD-10-CM

## 2022-05-30 ENCOUNTER — Ambulatory Visit: Payer: Self-pay

## 2022-05-30 DIAGNOSIS — I10 Essential (primary) hypertension: Secondary | ICD-10-CM

## 2022-05-30 DIAGNOSIS — E1169 Type 2 diabetes mellitus with other specified complication: Secondary | ICD-10-CM

## 2022-05-30 NOTE — Patient Outreach (Addendum)
  Care Coordination   Follow Up Visit Note   05/30/2022 Name: Sanayah Munro MRN: [id number] DOB: 12-Jan-1946  Wilhelmina Hark is a 77 y.o. year old female who sees [name], [place] for primary care. I spoke with  [name] by phone today.  What matters to the patients health and wellness today?  Patient is scheduled for imaging of her left breast due to experiencing acute left breast pain and tenderness. She needs assistance with transportation for this appointment.     Goals Addressed               This Visit's Progress     Patient Stated     I am having some depression (pt-stated)        Care Coordination Interventions: Evaluation of current treatment plan related to depression and patient's adherence to plan as established by provider Determined patient missed the call from [place] to discuss her depression  Determined patient believes she was having phone issues and would like a call back Sent in basket message to [facility] advising patient would like for her to try her again       I need to see a dentist (pt-stated)        Care Coordination Interventions: Evaluation of current treatment plan related to dental pain and patient's adherence to plan as established by provider Determined patient completed a new patient consultation with an oral surgeon Review of patient status, including review of consultant's reports, relevant laboratory and other test results Reviewed medications with patient and discussed importance of medication adherence Discussed with patient this surgeon will not complete her tooth extraction and has referred her to someone else Educated patient on the importance of completing her full course of antibiotics Educated patient on change to gut flora after taking multiple antibiotics in a short time frame, educated on the benefits of eating yogurt to help improve/restore gut health        Other     To have breast pain/tenderness further  evaluated        Care Coordination Interventions: Evaluation of current treatment plan related to left breast nodule and patient's adherence to plan as established by provider Determined patient completed a f/u with PCP to report left breast pain and tenderness Reviewed PCP recommendations including left breast mammography with [place] Reviewed upcoming scheduled imaging at GI scheduled for 06/05/22 '@10'$ :10 AM Determined patient does not have transportation for this appointment, she contacted [facility] and was informed transportation is not available for her through this health plan  [facility] [id number] referral for transportation assistance            SDOH assessments and interventions completed:  Yes  SDOH Interventions Today    Flowsheet Row Most Recent Value  SDOH Interventions   Transportation Interventions AMB Referral  [[id number]]        Care Coordination Interventions:  Yes, provided   Follow up plan: Referral made to [place] Management for assistance with medical transportation Follow up call scheduled for 06/09/22 '@1'$ :30 PM    Encounter Outcome:  Pt. Visit Completed

## 2022-05-30 NOTE — Patient Instructions (Signed)
Visit Information  Thank you for taking time to visit with me today. Please don't hesitate to contact me if I can be of assistance to you.   Following are the goals we discussed today:   Goals Addressed               This Visit's Progress     Patient Stated     I am having some depression (pt-stated)        Care Coordination Interventions: Evaluation of current treatment plan related to depression and patient's adherence to plan as established by provider Determined patient missed the call from [place] to discuss her depression  Determined patient believes she was having phone issues and would like a call back Sent in basket message to [facility] advising patient would like for her to try her again       I need to see a dentist (pt-stated)        Care Coordination Interventions: Evaluation of current treatment plan related to dental pain and patient's adherence to plan as established by provider Determined patient completed a new patient consultation with an oral surgeon Review of patient status, including review of consultant's reports, relevant laboratory and other test results Reviewed medications with patient and discussed importance of medication adherence Discussed with patient this surgeon will not complete her tooth extraction and has referred her to someone else Educated patient on the importance of completing her full course of antibiotics Educated patient on change to gut flora after taking multiple antibiotics in a short time frame, educated on the benefits of eating yogurt to help improve/restore gut health        Other     To have breast pain/tenderness further evaluated        Care Coordination Interventions: Evaluation of current treatment plan related to left breast nodule and patient's adherence to plan as established by provider Determined patient completed a f/u with PCP to report left breast pain and tenderness Reviewed PCP recommendations including  left breast mammography with [place] Reviewed upcoming scheduled imaging at GI scheduled for 06/05/22 @10$ :10 AM Determined patient does not have transportation for this appointment, she contacted [facility] and was informed transportation is not available for her through this health plan  [place] Management [id number] referral for transportation assistance            Our next appointment is by telephone on 06/09/22 at 1:30 PM  Please call the care guide team at [phone] if you need to cancel or reschedule your appointment.   If you are experiencing a Mental Health or [place] or need someone to talk to, please call 1-800-273-TALK (toll free, 24 hour hotline) go to [facility] [address], [place] [phone])  Patient verbalizes understanding of instructions and care plan provided today and agrees to view in [facility]. Active MyChart status and patient understanding of how to access instructions and care plan via MyChart confirmed with patient.     [name], RN, BSN, CCM Care Management Coordinator [facility] Care Management Direct Phone: [phone]

## 2022-05-31 ENCOUNTER — Telehealth: Payer: Self-pay | Admitting: *Deleted

## 2022-05-31 NOTE — Telephone Encounter (Signed)
   Telephone encounter was:  Unsuccessful.  05/31/2022 Name: Joanna Sandoval MRN: [id number] DOB: 04/25/1945  Unsuccessful outbound call made today to assist with:  Transportation Needs   Outreach Attempt:  1st Attempt Patient has insurance benefits for [employer] and could have booked within 3 business days notice will reach out again to try and schedule this one visit with [facility] transportation  A HIPAA compliant voice message was left requesting a return call.  Instructed patient to call back at [phone]. [place] [phone] [address] , [place] [address] Email : [email or web address]

## 2022-05-31 NOTE — Telephone Encounter (Signed)
   Telephone encounter was:  Successful.  05/31/2022 Name: Joanna Sandoval MRN: [id number] DOB: Jul 28, 1945  Joanna Sandoval is a 77 y.o. year old female who is a primary care patient of [name], [place] . The community resource team was consulted for assistance with Transportation Needs   Care guide performed the following interventions: Patient provided with information about care guide support team and interviewed to confirm resource needs. Going to schedule THn as she has appt monday walks with cane no riders , Called [facility] confirmed address and booked will call with [facility] via phone messaging  Follow Up Plan:  No further follow up planned at this time. The patient has been provided with needed resources.  [place] [phone] [address] , [place] [address] Email : [email or web address]

## 2022-06-05 ENCOUNTER — Telehealth: Payer: Self-pay | Admitting: *Deleted

## 2022-06-05 ENCOUNTER — Ambulatory Visit
Admission: RE | Admit: 2022-06-05 | Discharge: 2022-06-05 | Disposition: A | Discharge status: Home / Self Care | Payer: Medicare Other | Source: Ambulatory Visit | Attending: Nurse Practitioner | Admitting: Nurse Practitioner

## 2022-06-05 DIAGNOSIS — N644 Mastodynia: Secondary | ICD-10-CM

## 2022-06-05 NOTE — Telephone Encounter (Signed)
   Telephone encounter was:  Successful.  06/05/2022 Name: Joanna Sandoval MRN: [id number] DOB: 1945/09/05  Joanna Sandoval is a 77 y.o. year old female who is a primary care patient of [name], [place] . The community resource team was consulted for assistance with Transportation Needs   Care guide performed the following interventions: Patient provided with information about care guide support team and interviewed to confirm resource needs.  Follow Up Plan:  No further follow up planned at this time. The patient has been provided with needed resources.  [place] [phone] [address] , [place] [address] Email : Ashby [email or web address]

## 2022-06-07 ENCOUNTER — Ambulatory Visit (INDEPENDENT_AMBULATORY_CARE_PROVIDER_SITE_OTHER): Payer: Medicare Other | Admitting: Podiatry

## 2022-06-07 ENCOUNTER — Encounter: Payer: Self-pay | Admitting: Podiatry

## 2022-06-07 DIAGNOSIS — M7751 Other enthesopathy of right foot: Secondary | ICD-10-CM | POA: Diagnosis not present

## 2022-06-07 DIAGNOSIS — M10071 Idiopathic gout, right ankle and foot: Secondary | ICD-10-CM

## 2022-06-07 NOTE — Progress Notes (Signed)
Subjective:  Patient ID: Joanna Sandoval, female    DOB: [birth year],  MRN: [id number]  Chief Complaint  Patient presents with   Toe Pain    Pt stated that she is having some pain in her toe it is warm to the touch     77 y.o. female presents with the above complaint.  Patient presents with right first interphalangeal joint pain.  Patient states that she is having right lateral pain is warm to touch there is red swollen joint.  It appears to be very gout-like in nature.  Review of Systems: Negative except as noted in the HPI. Denies N/V/F/Ch.  Past Medical History:  Diagnosis Date   Arthritis    Diabetes mellitus    Hypercholesteremia    Hypertension    Post-menopausal bleeding    Postmenopausal bleeding 08/30/2011   Normal EBX 09/2011.    Current Outpatient Medications:    COMIRNATY SUSP injection, , Disp: , Rfl:    dorzolamide-timolol (COSOPT) 2-0.5 % ophthalmic solution, 1 drop 2 (two) times daily., Disp: , Rfl:    penicillin v potassium (VEETID) 500 MG tablet, Take 500 mg by mouth 4 (four) times daily., Disp: , Rfl:    traMADol (ULTRAM) 50 MG tablet, Take 50 mg by mouth every 6 (six) hours as needed., Disp: , Rfl:    Accu-Chek Softclix Lancets lancets, USE AS DIRECTED, Disp: 200 each, Rfl: 3   albuterol (PROVENTIL HFA;VENTOLIN HFA) 108 (90 Base) MCG/ACT inhaler, Inhale 2 puffs into the lungs every 6 (six) hours as needed., Disp: , Rfl:    atenolol (TENORMIN) 100 MG tablet, Take 50 mg by mouth daily. , Disp: , Rfl:    atorvastatin (LIPITOR) 20 MG tablet, Take 1 tablet (20 mg total) by mouth daily., Disp: 90 tablet, Rfl: 1   Blood Glucose Monitoring Suppl (ACCU-CHEK GUIDE) w/Device KIT, USE TO CHECK BLOOD SUGAR  TWICE DAILY AT 10AM AND 5PM, Disp: 1 kit, Rfl: 1   D3-50 50000 units capsule, , Disp: , Rfl: 0   diclofenac sodium (VOLTAREN) 1 % GEL, Apply 2 g topically 4 (four) times daily., Disp: 100 g, Rfl: 1   FARXIGA 10 MG TABS tablet, TAKE 1 TABLET BY MOUTH DAILY  BEFORE  BREAKFAST, Disp: 100 tablet, Rfl: 2   glimepiride (AMARYL) 2 MG tablet, TAKE 1 TABLET BY MOUTH IN THE  MORNING AND AT BEDTIME, Disp: 200 tablet, Rfl: 2   glucose blood (ACCU-CHEK GUIDE) test strip, Use as instructed, Disp: 100 each, Rfl: 12   [name] KIT, Use to test blood sugar twice daily as directed. E11.65, Disp: 1 kit, Rfl: 12   latanoprost (XALATAN) 0.005 % ophthalmic solution, 1 drop at bedtime., Disp: , Rfl:    losartan (COZAAR) 50 MG tablet, Take 50 mg by mouth daily., Disp: , Rfl:    magic mouthwash w/lidocaine SOLN, Take 5 mLs by mouth 3 (three) times daily. 1 part each solution, Disp: 120 mL, Rfl: 0   ondansetron (ZOFRAN ODT) 4 MG disintegrating tablet, Take 1 tablet (4 mg total) by mouth every 8 (eight) hours as needed for nausea or vomiting., Disp: 20 tablet, Rfl: 0   RYBELSUS 7 MG TABS, TAKE 1 TABLET BY MOUTH DAILY, Disp: 90 tablet, Rfl: 3  Social History   Tobacco Use  Smoking Status Former   Types: Cigarettes  Smokeless Tobacco Never    No Known Allergies Objective:  There were no vitals filed for this visit. There is no height or weight on file  to calculate BMI. Constitutional Well developed. Well nourished.  Vascular Dorsalis pedis pulses palpable bilaterally. Posterior tibial pulses palpable bilaterally. Capillary refill normal to all digits.  No cyanosis or clubbing noted. Pedal hair growth normal.  Neurologic Normal speech. Oriented to person, place, and time. Epicritic sensation to light touch grossly present bilaterally.  Dermatologic Nails well groomed and normal in appearance. No open wounds. No skin lesions.  Orthopedic: Pain on palpation of right first interphalangeal joint.  Hurts with ambulation hurts with pressure hurts with range of motion deep intra-articular pain noted.  No pain at the metatarsophalangeal joint no pain at the sesamoidal complex.   Radiographs: None Assessment:   1. Capsulitis of toe, right    2. Acute idiopathic gout involving toe of right foot     Plan:  Patient was evaluated and treated and all questions answered.  Right hallux interphalangeal joint capsulitis with Gout flare -All questions and concerns were discussed with the patient in extensive detail given the amount of pain that she is having I believe she will benefit from a steroid injection to help decrease acute inflammatory component associated pain.  Patient agrees with plan like to proceed with steroid injection. -A steroid injection was performed at right hallux IPJ using 1% plain Lidocaine and 10 mg of Kenalog. This was well tolerated. -I discussed gout diet in extensive detail.   No follow-ups on file.

## 2022-06-09 ENCOUNTER — Ambulatory Visit: Payer: Self-pay

## 2022-06-09 DIAGNOSIS — I10 Essential (primary) hypertension: Secondary | ICD-10-CM

## 2022-06-09 DIAGNOSIS — E1169 Type 2 diabetes mellitus with other specified complication: Secondary | ICD-10-CM

## 2022-06-09 NOTE — Patient Outreach (Signed)
Care Coordination   Follow Up Visit Note   06/09/2022 Name: Joanna Sandoval MRN: [id number] DOB: 1945-04-27  JURIE DUBROC is a 77 y.o. year old female who sees [name], [place] for primary care. I spoke with  Laruth Bouchard by phone today.  What matters to the patients health and wellness today?   Patient will adhere to dietary recommendations for gout management. She will keep her appointment with the oral surgeon.      Goals Addressed               This Visit's Progress     Patient Stated     I need to see a dentist (pt-stated)        Care Coordination Interventions: Evaluation of current treatment plan related to dental pain and patient's adherence to plan as established by provider Reviewed scheduled/upcoming provider appointment including: oral surgery for tooth extraction set for 06/26/22 '@10'$  AM Determined patient's sister will provide transportation for her to this appointment      To get gout cleared up (pt-stated)        Care Coordination Interventions: Evaluation of current treatment plan related to Gout and patient's adherence to plan as established by provider Determined patient completed a Podiatry follow up for evaluation of toe pain Review of patient status, including review of consultant's reports, relevant laboratory and other test results   Dx: Acute idiopathic gout involving toe of right foot   Plan: Gout flare -All questions and concerns were discussed with the patient in extensive detail given the amount of pain that she is having I believe she will benefit from a steroid injection to help decrease acute inflammatory component associated pain.  Patient agrees with plan like to proceed with steroid injection. -A steroid injection was performed at right hallux IPJ using 1% plain Lidocaine and 10 mg of Kenalog. This was well tolerated. -I discussed gout diet in extensive detail Discussed with patient her pain has nearly subsided and she is ambulating  more easily Mailed printed educational materials related to Gout          Other     COMPLETED: To have breast pain/tenderness further evaluated        Care Coordination Interventions: Evaluation of current treatment plan related to left breast nodule and patient's adherence to plan as established by provider Determined patient completed her mammogram and was given good results with no abnormalities noted Reviewed the following recommendations with patient;  Breast pain is a common condition which will often resolve on its own without intervention. Benign causes of breast pain were discussed with the patient. Breast pain can be improved by well-fitting support, over-the-counter pain medications such as oral NSAIDs and topical NSAIDs, low-fat diet, and ice/heat as needed. Studies have shown an improvement with use of evening primrose oil and vitamin E. The patient was encouraged to follow-up with referring physician if the pain persisted or worsened as this might indicate a need to evaluate the deeper structures of the underlying chest wall. The patient was instructed to return sooner if the area that she feels becomes larger and/or firmer to palpation, or if a new palpable abnormality is identified in either breast Determined patient verbalizes understanding of the recommendations provided           SDOH assessments and interventions completed:  No     Care Coordination Interventions:  Yes, provided   Follow up plan: Follow up call scheduled for 08/16/22 '@10'$ :30 AM  Encounter Outcome:  Pt. Visit Completed

## 2022-06-09 NOTE — Patient Instructions (Signed)
Visit Information  Thank you for taking time to visit with me today. Please don't hesitate to contact me if I can be of assistance to you.   Following are the goals we discussed today:   Goals Addressed               This Visit's Progress     Patient Stated     I need to see a dentist (pt-stated)        Care Coordination Interventions: Evaluation of current treatment plan related to dental pain and patient's adherence to plan as established by provider Reviewed scheduled/upcoming provider appointment including: oral surgery for tooth extraction set for 06/26/22 '@10'$  AM Determined patient's sister will provide transportation for her to this appointment      To get gout cleared up (pt-stated)        Care Coordination Interventions: Evaluation of current treatment plan related to Gout and patient's adherence to plan as established by provider Determined patient completed a Podiatry follow up for evaluation of toe pain Review of patient status, including review of consultant's reports, relevant laboratory and other test results   Dx: Acute idiopathic gout involving toe of right foot   Plan: Gout flare -All questions and concerns were discussed with the patient in extensive detail given the amount of pain that she is having I believe she will benefit from a steroid injection to help decrease acute inflammatory component associated pain.  Patient agrees with plan like to proceed with steroid injection. -A steroid injection was performed at right hallux IPJ using 1% plain Lidocaine and 10 mg of Kenalog. This was well tolerated. -I discussed gout diet in extensive detail Discussed with patient her pain has nearly subsided and she is ambulating more easily Mailed printed educational materials related to Gout          Other     COMPLETED: To have breast pain/tenderness further evaluated        Care Coordination Interventions: Evaluation of current treatment plan related to left breast  nodule and patient's adherence to plan as established by provider Determined patient completed her mammogram and was given good results with no abnormalities noted Reviewed the following recommendations with patient;  Breast pain is a common condition which will often resolve on its own without intervention. Benign causes of breast pain were discussed with the patient. Breast pain can be improved by well-fitting support, over-the-counter pain medications such as oral NSAIDs and topical NSAIDs, low-fat diet, and ice/heat as needed. Studies have shown an improvement with use of evening primrose oil and vitamin E. The patient was encouraged to follow-up with referring physician if the pain persisted or worsened as this might indicate a need to evaluate the deeper structures of the underlying chest wall. The patient was instructed to return sooner if the area that she feels becomes larger and/or firmer to palpation, or if a new palpable abnormality is identified in either breast Determined patient verbalizes understanding of the recommendations provided           Our next appointment is by telephone on 08/16/22 at 10:30 AM  Please call the care guide team at [phone] if you need to cancel or reschedule your appointment.   If you are experiencing a Mental Health or [place] or need someone to talk to, please call 1-800-273-TALK (toll free, 24 hour hotline) go to [facility] [place] [phone])  Patient verbalizes understanding of instructions and care plan  provided today and agrees to view in [place]. Active MyChart status and patient understanding of how to access instructions and care plan via MyChart confirmed with patient.     [name], RN, BSN, CCM Care Management Coordinator [facility] Care Management Direct Phone: [phone]

## 2022-06-12 ENCOUNTER — Telehealth: Payer: Self-pay | Admitting: *Deleted

## 2022-06-12 NOTE — Telephone Encounter (Signed)
   Telephone encounter was:  Successful.  06/12/2022 Name: Joanna Sandoval MRN: [id number] DOB: 1945/11/15  Joanna Sandoval is a 77 y.o. year old female who is a primary care patient of [name], [place] . The community resource team was consulted for assistance with Transportation Needs  Will call patient back she can apply for [facility] [place] and will again provide the [facility] transportation number to her for [place] until then , [phone] is the concierge line  Care guide performed the following interventions: Patient provided with information about care guide support team and interviewed to confirm resource needs.  Follow Up Plan:  Care guide will follow up with patient by phone over the next day  [place] [phone] [address] , [place] [address] Email : [email or web address] [email or web address]

## 2022-06-14 ENCOUNTER — Telehealth: Payer: Self-pay | Admitting: Licensed Clinical Social Worker

## 2022-06-14 NOTE — Patient Instructions (Signed)
Visit Information  Thank you for taking time to visit with me today. Please don't hesitate to contact me if I can be of assistance to you.   Following are the goals we discussed today:   Goals Addressed             This Visit's Progress    Obtain Supportive Resources   On track    Activities and task to complete in order to accomplish goals.   Patient will contact [facility] regarding interest in prescription for diabetic shoes Patient will continue to utilize healthy coping skills to assist management of depression and/or anxiety  Patient will continue to monitor diet to promote health management               Our next appointment is by telephone on 06/28/22 at 1 PM  Please call the care guide team at [phone] if you need to cancel or reschedule your appointment.   If you are experiencing a Mental Health or [place] or need someone to talk to, please call the Suicide and Crisis Lifeline: 988 call 911   Patient verbalizes understanding of instructions and care plan provided today and agrees to view in [place]. Active MyChart status and patient understanding of how to access instructions and care plan via MyChart confirmed with patient.     Christa See, MSW, [email or web address] Phone [phone] 4:54 PM

## 2022-06-14 NOTE — Patient Outreach (Signed)
  Care Coordination   Follow Up Visit Note   06/14/2022 Name: Joanna Sandoval MRN: [id number] DOB: [birth year]  Joanna Sandoval is a 77 y.o. year old female who sees [name], [place] for primary care. I spoke with  Laruth Bouchard by phone today.  What matters to the patients health and wellness today?   Patient continues to maintain positive progress with goals.    Goals Addressed             This Visit's Progress    Obtain Supportive Resources   On track    Activities and task to complete in order to accomplish goals.   Patient will contact [facility] regarding interest in prescription for diabetic shoes Patient will continue to utilize healthy coping skills to assist management of depression and/or anxiety  Patient will continue to monitor diet to promote health management               SDOH assessments and interventions completed:  Yes  SDOH Interventions Today    Flowsheet Row Most Recent Value  SDOH Interventions   Food Insecurity Interventions Intervention Not Indicated  Housing Interventions Intervention Not Indicated  Transportation Interventions Intervention Not Indicated        Care Coordination Interventions:  Yes, provided  Interventions Today    Flowsheet Row Most Recent Value  Chronic Disease   Chronic disease during today's visit Hypertension (HTN), Diabetes  General Interventions   General Interventions Discussed/Reviewed General Interventions Reviewed  [LCSW reviewed upcoming appts. Has dental appt on 3/11 and eye appt on 3/7]  [place] Reviewed, Coping Strategies  Nutrition Interventions   Nutrition Discussed/Reviewed Nutrition Reviewed  [Pt has good insight of correlation between one's diet and health management. SNAP benefits were reinstated by DSS]       Follow up plan: Follow up call scheduled for 2-4 weeks    Encounter Outcome:  Pt. Visit  Completed   Christa See, MSW, [email or web address] Phone [phone] 4:53 PM

## 2022-06-22 DIAGNOSIS — H401131 Primary open-angle glaucoma, bilateral, mild stage: Secondary | ICD-10-CM | POA: Diagnosis not present

## 2022-06-28 ENCOUNTER — Ambulatory Visit: Payer: Self-pay | Admitting: Licensed Clinical Social Worker

## 2022-06-28 ENCOUNTER — Ambulatory Visit: Payer: Self-pay

## 2022-06-28 NOTE — Patient Instructions (Signed)
Visit Information  Thank you for taking time to visit with me today. Please don't hesitate to contact me if I can be of assistance to you.   Following are the goals we discussed today:   Goals Addressed               This Visit's Progress     Patient Stated     I need to see a dentist (pt-stated)        Care Coordination Interventions: Evaluation of current treatment plan related to dental pain and patient's adherence to plan as established by provider Received inbound call from patient advising she underwent a tooth extraction on 06/26/22 performed by oral surgeon, Dr. Sherrilee Gilles  Determined patient states she is doing very well Reviewed and discussed prescribed medication regimen and answered patient's questions regarding when to take her medications Educated patient about the importance of monitoring blood sugars closely and to report abnormal readings or concerns to her doctor Determined patient will undergo 2 other future tooth extractions in preparation to be fitted for a partial denture        Our next appointment is by telephone on 08/16/22 at 10:30 AM  Please call the care guide team at [phone] if you need to cancel or reschedule your appointment.   If you are experiencing a Mental Health or [place] or need someone to talk to, please call 1-800-273-TALK (toll free, 24 hour hotline) go to [facility] [address], [place] [phone])  Patient verbalizes understanding of instructions and care plan provided today and agrees to view in [place]. Active MyChart status and patient understanding of how to access instructions and care plan via MyChart confirmed with patient.     [name], RN, BSN, CCM Care Management Coordinator [facility] Care Management  Direct Phone: [phone]

## 2022-06-28 NOTE — Patient Outreach (Signed)
  Care Coordination   Follow Up Visit Note   06/28/2022 Name: Joanna Sandoval MRN: [id number] DOB: [birth year]  Joanna Sandoval is a 77 y.o. year old female who sees [name], [place] for primary care. I spoke with  Laruth Bouchard by phone today.  What matters to the patients health and wellness today?  Patient will follow post operative instructions following tooth extraction. She will closely monitor her blood sugars at home and report abnormal readings or concerns to her doctor.     Goals Addressed               This Visit's Progress     Patient Stated     I need to see a dentist (pt-stated)        Care Coordination Interventions: Evaluation of current treatment plan related to dental pain and patient's adherence to plan as established by provider Received inbound call from patient advising she underwent a tooth extraction on 06/26/22 performed by oral surgeon, Dr. Sherrilee Gilles  Determined patient states she is doing very well Reviewed and discussed prescribed medication regimen and answered patient's questions regarding when to take her medications Educated patient about the importance of monitoring blood sugars closely and to report abnormal readings or concerns to her doctor Determined patient will undergo 2 other future tooth extractions in preparation to be fitted for a partial denture        SDOH assessments and interventions completed:  No     Care Coordination Interventions:  Yes, provided   Follow up plan: Follow up call scheduled for 08/16/22 @10 :30 AM    Encounter Outcome:  Pt. Visit Completed

## 2022-06-28 NOTE — Patient Instructions (Signed)
Visit Information  Thank you for taking time to visit with me today. Please don't hesitate to contact me if I can be of assistance to you.   Following are the goals we discussed today:   Goals Addressed             This Visit's Progress    Obtain Supportive Resources   On track    Activities and task to complete in order to accomplish goals.   Patient will contact [facility] regarding interest in prescription for diabetic shoes Patient will continue to utilize healthy coping skills to assist management of depression and/or anxiety Patient will continue to monitor diet to promote health management Patient will review Access [place] application. Agreed to contact [facility] to schedule transportation for medical appts               Our next appointment is by telephone on 07/19/22 at 1 PM  Please call the care guide team at [phone] if you need to cancel or reschedule your appointment.   If you are experiencing a Mental Health or [place] or need someone to talk to, please call the Suicide and Crisis Lifeline: 988 call 911   Patient verbalizes understanding of instructions and care plan provided today and agrees to view in [place]. Active MyChart status and patient understanding of how to access instructions and care plan via MyChart confirmed with patient.     Christa See, MSW, [email or web address] Phone [phone] 4:02 PM

## 2022-06-28 NOTE — Patient Outreach (Signed)
  Care Coordination   Follow Up Visit Note   06/28/2022 Name: Joanna Sandoval MRN: [id number] DOB: 02-25-1946  Joanna Sandoval is a 77 y.o. year old female who sees [name], [place] for primary care. I spoke with  Laruth Bouchard by phone today.  What matters to the patients health and wellness today?  Symptom Management and Transportation    Goals Addressed             This Visit's Progress    Obtain Supportive Resources   On track    Activities and task to complete in order to accomplish goals.   Patient will contact [facility] regarding interest in prescription for diabetic shoes Patient will continue to utilize healthy coping skills to assist management of depression and/or anxiety Patient will continue to monitor diet to promote health management Patient will review Access [place] application. Agreed to contact [facility] to schedule transportation for medical appts               SDOH assessments and interventions completed:  No     Care Coordination Interventions:  Yes, provided   Follow up plan: Follow up call scheduled for 2 weeks    Encounter Outcome:  Pt. Visit Completed   Christa See, MSW, [email or web address] Phone [phone] 4:00 PM'

## 2022-07-19 ENCOUNTER — Ambulatory Visit: Payer: Self-pay | Admitting: Licensed Clinical Social Worker

## 2022-07-19 NOTE — Patient Outreach (Addendum)
  Care Coordination   Follow Up Visit Note   07/19/2022 Name: Joanna Sandoval MRN: [id number] DOB: 09-20-1945  Joanna Sandoval is a 77 y.o. year old female who sees [name], [place] for primary care. I spoke with  Laruth Bouchard by phone today.  What matters to the patients health and wellness today?  Symptom Management    Goals Addressed             This Visit's Progress    Obtain Supportive Resources   On track    Activities and task to complete in order to accomplish goals.   Patient will contact [facility] regarding interest in prescription for diabetic shoes Patient will continue to utilize healthy coping skills to assist management of depression and/or anxiety Patient will continue to monitor diet to promote health management Patient will review [facility] [place] application. Agreed to contact [facility] to schedule transportation for medical appts               SDOH assessments and interventions completed:  No     Care Coordination Interventions:  Yes, provided  Interventions Today    Flowsheet Row Most Recent Value  Chronic Disease   Chronic disease during today's visit Hypertension (HTN), Diabetes  General Interventions   General Interventions Discussed/Reviewed General Interventions Reviewed  [Patient reports doing well managing health conditions. Barriers to care assessed. Reviewed upcoming appts]  Doctor Visits Discussed/Reviewed Doctor Visits Reviewed  Milas Kocher and eye doctor appts reviewed]  Exercise Interventions   Exercise Discussed/Reviewed Exercise Reviewed  Mental Health Interventions   Mental Health Discussed/Reviewed Mental Health Reviewed, Coping Strategies  [Patient is particpating with church, visits with family, and has plans to enjoy garden this season. Mental health symptoms have improved]  Pharmacy Interventions   Pharmacy Dicussed/Reviewed Pharmacy Topics Reviewed, Medication Adherence  [Pt endorses compliance with medications,  including eye drops to assist with managing pressure in eyes]  Safety Interventions   Safety Discussed/Reviewed Safety Reviewed  [Patient continues to utilize cane and walker when ambulating. Denies any recent falls]       Follow up plan: Follow up call scheduled for 4-6 weeks    Encounter Outcome:  Pt. Visit Completed   Christa See, MSW, [email or web address] Phone [phone] 2:32 PM

## 2022-07-19 NOTE — Patient Instructions (Signed)
Visit Information  Thank you for taking time to visit with me today. Please don't hesitate to contact me if I can be of assistance to you.   Following are the goals we discussed today:   Goals Addressed             This Visit's Progress    Obtain Supportive Resources   On track    Activities and task to complete in order to accomplish goals.   Patient will contact [facility] regarding interest in prescription for diabetic shoes Patient will continue to utilize healthy coping skills to assist management of depression and/or anxiety Patient will continue to monitor diet to promote health management Patient will review [facility] [place] application. Agreed to contact [facility] to schedule transportation for medical appts               Our next appointment is by telephone on 5/22 at 11 AM  Please call the care guide team at [phone] if you need to cancel or reschedule your appointment.   If you are experiencing a Mental Health or [place] or need someone to talk to, please call the Suicide and Crisis Lifeline: 988 call 911   Patient verbalizes understanding of instructions and care plan provided today and agrees to view in [place]. Active MyChart status and patient understanding of how to access instructions and care plan via MyChart confirmed with patient.     Christa See, MSW, [email or web address] Phone [phone] 2:30 PM

## 2022-08-14 ENCOUNTER — Encounter: Payer: Self-pay | Admitting: Nurse Practitioner

## 2022-08-14 ENCOUNTER — Ambulatory Visit (INDEPENDENT_AMBULATORY_CARE_PROVIDER_SITE_OTHER): Payer: Medicare Other | Admitting: Nurse Practitioner

## 2022-08-14 VITALS — BP 110/66 | HR 92 | Temp 98.8°F | Ht 61.0 in | Wt 196.0 lb

## 2022-08-14 DIAGNOSIS — Z79899 Other long term (current) drug therapy: Secondary | ICD-10-CM | POA: Diagnosis not present

## 2022-08-14 DIAGNOSIS — E1159 Type 2 diabetes mellitus with other circulatory complications: Secondary | ICD-10-CM | POA: Diagnosis not present

## 2022-08-14 DIAGNOSIS — Z0000 Encounter for general adult medical examination without abnormal findings: Secondary | ICD-10-CM

## 2022-08-14 DIAGNOSIS — I739 Peripheral vascular disease, unspecified: Secondary | ICD-10-CM

## 2022-08-14 DIAGNOSIS — E1169 Type 2 diabetes mellitus with other specified complication: Secondary | ICD-10-CM | POA: Diagnosis not present

## 2022-08-14 DIAGNOSIS — E119 Type 2 diabetes mellitus without complications: Secondary | ICD-10-CM

## 2022-08-14 DIAGNOSIS — Z0001 Encounter for general adult medical examination with abnormal findings: Secondary | ICD-10-CM | POA: Diagnosis not present

## 2022-08-14 DIAGNOSIS — E559 Vitamin D deficiency, unspecified: Secondary | ICD-10-CM

## 2022-08-14 DIAGNOSIS — M109 Gout, unspecified: Secondary | ICD-10-CM | POA: Diagnosis not present

## 2022-08-14 DIAGNOSIS — E6609 Other obesity due to excess calories: Secondary | ICD-10-CM

## 2022-08-14 DIAGNOSIS — I119 Hypertensive heart disease without heart failure: Secondary | ICD-10-CM | POA: Diagnosis not present

## 2022-08-14 DIAGNOSIS — Z6837 Body mass index (BMI) 37.0-37.9, adult: Secondary | ICD-10-CM

## 2022-08-14 DIAGNOSIS — I10 Essential (primary) hypertension: Secondary | ICD-10-CM | POA: Diagnosis not present

## 2022-08-14 DIAGNOSIS — F329 Major depressive disorder, single episode, unspecified: Secondary | ICD-10-CM

## 2022-08-14 DIAGNOSIS — E1151 Type 2 diabetes mellitus with diabetic peripheral angiopathy without gangrene: Secondary | ICD-10-CM

## 2022-08-14 DIAGNOSIS — F3342 Major depressive disorder, recurrent, in full remission: Secondary | ICD-10-CM

## 2022-08-14 LAB — POCT URINALYSIS DIPSTICK
Bilirubin, UA: NEGATIVE
Blood, UA: NEGATIVE
Glucose, UA: POSITIVE — AB
Ketones, UA: NEGATIVE
Leukocytes, UA: NEGATIVE
Nitrite, UA: NEGATIVE
Protein, UA: NEGATIVE
Spec Grav, UA: 1.025 (ref 1.010–1.025)
Urobilinogen, UA: 0.2 E.U./dL
pH, UA: 5.5 (ref 5.0–8.0)

## 2022-08-14 NOTE — Progress Notes (Addendum)
Joanna Sandoval,acting as a Neurosurgeon for Joanna Felts, FNP.,have documented all relevant documentation on the behalf of Joanna Felts, FNP,as directed by  Joanna Felts, FNP while in the presence of Joanna Felts, FNP.   Subjective:     Patient ID: Joanna Sandoval , female    DOB: 14-Jun-1945 , 77 y.o.   MRN: [id number]   Chief Complaint  Patient presents with   Annual Exam    HPI  Patient presents today for HM. Patient states compliance with medication. Patient reports thinking she has a gout flare up in her right toe. Patient reports she has a new grand baby who has down syndrome who is still in the hospital and her granddaughter was able to come home that was born on April 12th. She has a SCAT form to complete.   BP Readings from Last 3 Encounters: 08/14/22 : 110/66 05/22/22 : 118/68 04/13/22 : 124/82  Wt Readings from Last 3 Encounters: 08/14/22 : 196 lb (88.9 kg) 05/22/22 : 195 lb (88.5 kg) 04/13/22 : 210 lb (95.3 kg)     Diabetes She presents for her follow-up diabetic visit. She has type 2 diabetes mellitus. Her disease course has been worsening. Pertinent negatives for hypoglycemia include no dizziness or headaches. Pertinent negatives for diabetes include no chest pain, no fatigue, no polydipsia, no polyphagia and no polyuria. Symptoms are resolved. Diabetic complications include heart disease. (PAD) Risk factors for coronary artery disease include obesity, hypertension, diabetes mellitus, dyslipidemia, sedentary lifestyle and post-menopausal. Current diabetic treatment includes oral agent (dual therapy). She is compliant with treatment most of the time. Her weight is stable. She is following a generally unhealthy diet. When asked about meal planning, she reported none. She has not had a previous visit with a dietitian. She participates in exercise every other day. Her home blood glucose trend is decreasing steadily. She does not see a podiatrist.Eye exam is current (her appt is  in June).  Hypertension This is a chronic problem. The current episode started more than 1 year ago. The problem has been gradually improving since onset. The problem is controlled. Pertinent negatives include no chest pain, headaches or palpitations. There are no associated agents to hypertension. Risk factors for coronary artery disease include obesity, post-menopausal state, sedentary lifestyle and diabetes mellitus. The current treatment provides significant improvement. Compliance problems include exercise.  There is no history of angina or kidney disease. There is no history of chronic renal disease or sleep apnea.     Past Medical History:  Diagnosis Date   Arthritis    Diabetes mellitus    Hypercholesteremia    Hypertension    Post-menopausal bleeding    Postmenopausal bleeding 08/30/2011   Normal EBX 09/2011.     Family History  Problem Relation Age of Onset   Hypertension Mother    Hypertension Father    Cancer Father        ?lung   Diabetes Sister      Current Outpatient Medications:    Accu-Chek Softclix Lancets lancets, USE AS DIRECTED, Disp: 200 each, Rfl: 3   albuterol (PROVENTIL HFA;VENTOLIN HFA) 108 (90 Base) MCG/ACT inhaler, Inhale 2 puffs into the lungs every 6 (six) hours as needed., Disp: , Rfl:    atenolol (TENORMIN) 100 MG tablet, Take 50 mg by mouth daily. , Disp: , Rfl:    atorvastatin (LIPITOR) 20 MG tablet, Take 1 tablet (20 mg total) by mouth daily., Disp: 90 tablet, Rfl: 1   Blood Glucose Monitoring Suppl (ACCU-CHEK GUIDE)  w/Device KIT, USE TO CHECK BLOOD SUGAR  TWICE DAILY AT 10AM AND 5PM, Disp: 1 kit, Rfl: 1   COMIRNATY SUSP injection, , Disp: , Rfl:    D3-50 50000 units capsule, , Disp: , Rfl: 0   diclofenac sodium (VOLTAREN) 1 % GEL, Apply 2 g topically 4 (four) times daily., Disp: 100 g, Rfl: 1   dorzolamide-timolol (COSOPT) 2-0.5 % ophthalmic solution, 1 drop 2 (two) times daily., Disp: , Rfl:    FARXIGA 10 MG TABS tablet, TAKE 1 TABLET BY MOUTH  DAILY  BEFORE BREAKFAST, Disp: 100 tablet, Rfl: 2   glimepiride (AMARYL) 2 MG tablet, TAKE 1 TABLET BY MOUTH IN THE  MORNING AND AT BEDTIME, Disp: 200 tablet, Rfl: 2   glucose blood (ACCU-CHEK GUIDE) test strip, Use as instructed, Disp: 100 each, Rfl: 12   [name] KIT, Use to test blood sugar twice daily as directed. E11.65, Disp: 1 kit, Rfl: 12   latanoprost (XALATAN) 0.005 % ophthalmic solution, 1 drop at bedtime., Disp: , Rfl:    losartan (COZAAR) 50 MG tablet, Take 50 mg by mouth daily., Disp: , Rfl:    magic mouthwash w/lidocaine SOLN, Take 5 mLs by mouth 3 (three) times daily. 1 part each solution, Disp: 120 mL, Rfl: 0   ondansetron (ZOFRAN ODT) 4 MG disintegrating tablet, Take 1 tablet (4 mg total) by mouth every 8 (eight) hours as needed for nausea or vomiting., Disp: 20 tablet, Rfl: 0   penicillin v potassium (VEETID) 500 MG tablet, Take 500 mg by mouth 4 (four) times daily., Disp: , Rfl:    RYBELSUS 7 MG TABS, TAKE 1 TABLET BY MOUTH DAILY, Disp: 90 tablet, Rfl: 3   traMADol (ULTRAM) 50 MG tablet, Take 50 mg by mouth every 6 (six) hours as needed., Disp: , Rfl:    Albuterol-Budesonide (AIRSUPRA) 90-80 MCG/ACT AERO, Inhale 2 Inhalations into the lungs every 6 (six) hours as needed., Disp: 10.7 g, Rfl: 2   No Known Allergies    The patient states she is post menopausal status.  No LMP recorded. Patient is postmenopausal.  Negative for Dysmenorrhea and Negative for Menorrhagia. Negative for: breast discharge, breast lump(s), breast pain and breast self exam. Associated symptoms include abnormal vaginal bleeding. Pertinent negatives include abnormal bleeding (hematology), anxiety, decreased libido, depression, difficulty falling sleep, dyspareunia, history of infertility, nocturia, sexual dysfunction, sleep disturbances, urinary incontinence, urinary urgency, vaginal discharge and vaginal itching. Diet regular.The patient states her exercise level is minimal -  walking around her apartment complex.   The patient's tobacco use is:  Social History   Tobacco Use  Smoking Status Former   Types: Cigarettes  Smokeless Tobacco Never   She has been exposed to passive smoke. The patient's alcohol use is:  Social History   Substance and Sexual Activity  Alcohol Use No    Review of Systems  Constitutional: Negative.  Negative for fatigue.  HENT: Negative.    Eyes: Negative.   Cardiovascular:  Negative for chest pain, palpitations and leg swelling.  Endocrine: Negative.  Negative for polydipsia, polyphagia and polyuria.  Genitourinary: Negative.   Musculoskeletal: Negative.   Skin: Negative.   Allergic/Immunologic: Negative.   Neurological: Negative.  Negative for dizziness and headaches.  Hematological: Negative.   Psychiatric/Behavioral: Negative.       Today's Vitals   08/14/22 0944  BP: 110/66  Pulse: 92  Temp: 98.8 F (37.1 C)  TempSrc: Oral  Weight: 196 lb (88.9 kg)  Height: 5\' 1"  (1.549  m)  PainSc: 0-No pain   Body mass index is 37.03 kg/m.  Wt Readings from Last 3 Encounters:  08/14/22 196 lb (88.9 kg)  05/22/22 195 lb (88.5 kg)  04/13/22 210 lb (95.3 kg)    Objective:  Physical Exam Vitals reviewed.  Constitutional:      General: She is not in acute distress.    Appearance: Normal appearance. She is well-developed. She is obese.  HENT:     Head: Normocephalic and atraumatic.     Right Ear: Hearing, tympanic membrane, ear canal and external ear normal. There is no impacted cerumen.     Left Ear: Hearing, tympanic membrane, ear canal and external ear normal. There is no impacted cerumen.     Nose:     Comments: Deferred - mask    Mouth/Throat:     Comments: Deferred - mask Eyes:     General: Lids are normal.     Extraocular Movements: Extraocular movements intact.     Conjunctiva/sclera: Conjunctivae normal.     Pupils: Pupils are equal, round, and reactive to light.     Funduscopic exam:    Right eye: No  papilledema.        Left eye: No papilledema.  Neck:     Thyroid: No thyroid mass.     Vascular: No carotid bruit.  Cardiovascular:     Rate and Rhythm: Normal rate and regular rhythm.     Pulses: Normal pulses.     Heart sounds: Normal heart sounds. No murmur heard. Pulmonary:     Effort: Pulmonary effort is normal. No respiratory distress.     Breath sounds: Normal breath sounds. No wheezing.  Abdominal:     General: Abdomen is flat. Bowel sounds are normal. There is no distension.     Palpations: Abdomen is soft. There is no mass.     Tenderness: There is no abdominal tenderness.  Musculoskeletal:        General: No swelling. Normal range of motion.     Cervical back: Full passive range of motion without pain, normal range of motion and neck supple.     Right lower leg: No edema.     Left lower leg: No edema.  Skin:    General: Skin is warm and dry.     Capillary Refill: Capillary refill takes less than 2 seconds.  Neurological:     General: No focal deficit present.     Mental Status: She is alert and oriented to person, place, and time.     Cranial Nerves: No cranial nerve deficit.     Sensory: No sensory deficit.     Motor: No weakness.  Psychiatric:        Mood and Affect: Mood normal.        Behavior: Behavior normal.        Thought Content: Thought content normal.        Judgment: Judgment normal.         Assessment And Plan:     1. Annual physical exam Behavior modifications discussed and diet history reviewed.   Pt will continue to exercise regularly and modify diet with low GI, plant based foods and decrease intake of processed foods.  Recommend intake of daily multivitamin, Vitamin D, and calcium.  Recommend mammogram and colonoscopy for preventive screenings, as well as recommend immunizations that include influenza, TDAP, and Shingles  2. Class 2 obesity due to excess calories with body mass index (BMI) of 37.0 to 37.9 in adult,  unspecified whether  serious comorbidity present - Lipid panel  3. Hypertensive heart disease without heart failure Comments: Blood pressure is well controlled, continue current medications. EKG done with first degree av block HR 87 - EKG 12-Lead - POCT urinalysis dipstick - CMP14+EGFR  4. Obesity, diabetes and hypertension syndrome Comments: HgbA1c is improving, Continue current medications. - Microalbumin / creatinine urine ratio - CMP14+EGFR - Hemoglobin A1c - Lipid panel  5. Vitamin D deficiency Will check vitamin D level and supplement as needed.    Also encouraged to spend 15 minutes in the sun daily.  - Vitamin D (25 hydroxy)  6. Recurrent major depressive disorder, in full remission (HCC) - CMP14+EGFR  7. Gout involving toe, unspecified cause, unspecified chronicity, unspecified laterality - Uric acid  8. PAD (peripheral artery disease) (HCC) Continue statin, tolerating well.  9. Other long term (current) drug therapy - CBC   Patient was given opportunity to ask questions. Patient verbalized understanding of the plan and was able to repeat key elements of the plan. All questions were answered to their satisfaction.   Joanna Felts, FNP   I, Joanna Felts, FNP, have reviewed all documentation for this visit. The documentation on 08/14/22 for the exam, diagnosis, procedures, and orders are all accurate and complete.   THE PATIENT IS ENCOURAGED TO PRACTICE SOCIAL DISTANCING DUE TO THE COVID-19 PANDEMIC.

## 2022-08-14 NOTE — Patient Instructions (Signed)

## 2022-08-15 LAB — MICROALBUMIN / CREATININE URINE RATIO
Creatinine, Urine: 160.7 mg/dL
Microalb/Creat Ratio: 12 mg/g creat (ref 0–29)
Microalbumin, Urine: 18.8 ug/mL

## 2022-08-15 LAB — CBC
Hematocrit: 40.7 % (ref 34.0–46.6)
Hemoglobin: 12.7 g/dL (ref 11.1–15.9)
MCH: 27.5 pg (ref 26.6–33.0)
MCHC: 31.2 g/dL — ABNORMAL LOW (ref 31.5–35.7)
MCV: 88 fL (ref 79–97)
Platelets: 292 10*3/uL (ref 150–450)
RBC: 4.62 x10E6/uL (ref 3.77–5.28)
RDW: 18.9 % — ABNORMAL HIGH (ref 11.7–15.4)
WBC: 12 10*3/uL — ABNORMAL HIGH (ref 3.4–10.8)

## 2022-08-15 LAB — CMP14+EGFR
ALT: 10 IU/L (ref 0–32)
AST: 17 IU/L (ref 0–40)
Albumin/Globulin Ratio: 1.4 (ref 1.2–2.2)
Albumin: 4.2 g/dL (ref 3.8–4.8)
Alkaline Phosphatase: 125 IU/L — ABNORMAL HIGH (ref 44–121)
BUN/Creatinine Ratio: 14 (ref 12–28)
BUN: 14 mg/dL (ref 8–27)
Bilirubin Total: 0.6 mg/dL (ref 0.0–1.2)
CO2: 27 mmol/L (ref 20–29)
Calcium: 9.3 mg/dL (ref 8.7–10.3)
Chloride: 105 mmol/L (ref 96–106)
Creatinine, Ser: 0.98 mg/dL (ref 0.57–1.00)
Globulin, Total: 2.9 g/dL (ref 1.5–4.5)
Glucose: 75 mg/dL (ref 70–99)
Potassium: 4.6 mmol/L (ref 3.5–5.2)
Sodium: 144 mmol/L (ref 134–144)
Total Protein: 7.1 g/dL (ref 6.0–8.5)
eGFR: 60 mL/min/{1.73_m2} (ref >59)

## 2022-08-15 LAB — HEMOGLOBIN A1C
Est. average glucose Bld gHb Est-mCnc: 160 mg/dL
Hgb A1c MFr Bld: 7.2 % — ABNORMAL HIGH (ref 4.8–5.6)

## 2022-08-15 LAB — LIPID PANEL
Chol/HDL Ratio: 2.5 ratio (ref 0.0–4.4)
Cholesterol, Total: 142 mg/dL (ref 100–199)
HDL: 57 mg/dL (ref >39)
LDL Chol Calc (NIH): 65 mg/dL (ref 0–99)
Triglycerides: 108 mg/dL (ref 0–149)
VLDL Cholesterol Cal: 20 mg/dL (ref 5–40)

## 2022-08-15 LAB — URIC ACID: Uric Acid: 4.3 mg/dL (ref 3.1–7.9)

## 2022-08-15 LAB — VITAMIN D 25 HYDROXY (VIT D DEFICIENCY, FRACTURES): Vit D, 25-Hydroxy: 50.8 ng/mL (ref 30.0–100.0)

## 2022-08-23 ENCOUNTER — Ambulatory Visit: Payer: Self-pay

## 2022-08-23 NOTE — Patient Outreach (Signed)
Care Coordination   Follow Up Visit Note   08/23/2022 Name: Joanna Sandoval MRN: [id number] DOB: 1945-12-20  Joanna Sandoval is a 77 y.o. year old female who sees Joanna Felts, Joanna Sandoval for primary care. I spoke with  Joanna Sandoval by phone today.  What matters to the patients health and wellness today?  Patient would like to resume focusing on better controlling her diabetes. She would like to discuss hand and knee tremors with her PCP at next scheduled visit.     Goals Addressed               This Visit's Progress     Patient Stated     I am having some depression (pt-stated)        Care Coordination Interventions: Evaluation of current treatment plan related to depression and patient's adherence to plan as established by provider Determined patient has experienced increased stress and anxiety recently due to having her great grandson born early with Down Syndrome Active listening / Reflection utilized  Emotional Support Provided Noted patient has a follow up call with LCSW Joanna Sandoval scheduled for 09/06/22 @11 :00 AM Sent in basket message to [place] with a patient status update       COMPLETED: To get gout cleared up (pt-stated)        Care Coordination Interventions: Evaluation of current treatment plan related to Gout and patient's adherence to plan as established by provider Review of patient status, including review of consultant's reports, relevant laboratory and other test results Component Ref Range & Units 9 d ago  Uric Acid 3.1 - 7.9 mg/dL 4.3  Comment:            Therapeutic target for gout patients: <6.0  Resulting Agency [facility]     Specimen Collected: 08/14/22 10:40 Last Resulted: 08/15/22 14:36           COMPLETED: To lower LDL (pt-stated)        Care Coordination Interventions: Evaluation of current treatment plan related to elevated LDL and patient's adherence to plan as established by provider Review of patient status, including review of  consultant's reports, relevant laboratory and other test results, and medications completed Lipid Panel     Component Value Date/Time   CHOL 142 08/14/2022 1040   TRIG 108 08/14/2022 1040   HDL 57 08/14/2022 1040   CHOLHDL 2.5 08/14/2022 1040   CHOLHDL 4.6 10/12/2006 0840   VLDL 16 10/12/2006 0840   LDLCALC 65 08/14/2022 1040   LABVLDL 20 08/14/2022 1040         Other     To have tremors evaluated        Care Coordination Interventions: Evaluation of current treatment plan related to tremors in hands and knees and patient's adherence to plan as established by provider Discussed with patient her concerns regarding worsening hand tremors and knee tremors, discussed she is having difficulty feeding herself and is embarrassed to go out to eat, she is not driving due to having tremors in her knees Determined patient has not reported these symptoms to her PCP, she would like to have these symptoms evaluated and is fearful she has Parkinson's disease Educated patient about the basic disease process related to tremors including potential underlying conditions, diagnosis and treatment Determined patient would like to discuss her symptoms with her PCP at next scheduled visit in August Sent in basket message to Joanna Felts Joanna Sandoval advising of patient's reported symptoms/concerns         To  lower A1c <7.0%        Care Coordination Interventions: Provided education to patient about basic DM disease process Review of patient status, including review of consultants reports, relevant laboratory and other test results, and medications completed Counseled on Diabetic diet, my plate method, 161 minutes of moderate intensity exercise/week Determined patient has completed a SCAT application to assist with transportation, once completed, she will consider participating in the PREP program Assessed patient's understanding of A1c goal: <7% Lab Results  Component Value Date   HGBA1C 7.2 (H) 08/14/2022          SDOH assessments and interventions completed:  No     Care Coordination Interventions:  Yes, provided   Follow up plan: Referral made to LCSW Joanna Sandoval (pt already established, sent in basket message regarding worsening anxiety/depression related to recent family event  Follow up call scheduled for 09/20/22 @11 :00 AM    Encounter Outcome:  Pt. Visit Completed

## 2022-08-23 NOTE — Patient Instructions (Signed)
Visit Information  Thank you for taking time to visit with me today. Please don't hesitate to contact me if I can be of assistance to you.   Following are the goals we discussed today:   Goals Addressed               This Visit's Progress     Patient Stated     I am having some depression (pt-stated)        Care Coordination Interventions: Evaluation of current treatment plan related to depression and patient's adherence to plan as established by provider Determined patient has experienced increased stress and anxiety recently due to having her great grandson born early with Down Syndrome Active listening / Reflection utilized  Emotional Support Provided Noted patient has a follow up call with LCSW Jenel Lucks scheduled for 09/06/22 @11 :00 AM Sent in basket message to [place] with a patient status update       COMPLETED: To get gout cleared up (pt-stated)        Care Coordination Interventions: Evaluation of current treatment plan related to Gout and patient's adherence to plan as established by provider Review of patient status, including review of consultant's reports, relevant laboratory and other test results Component Ref Range & Units 9 d ago  Uric Acid 3.1 - 7.9 mg/dL 4.3  Comment:            Therapeutic target for gout patients: <6.0  Resulting Agency [facility]     Specimen Collected: 08/14/22 10:40 Last Resulted: 08/15/22 14:36           COMPLETED: To lower LDL (pt-stated)        Care Coordination Interventions: Evaluation of current treatment plan related to elevated LDL and patient's adherence to plan as established by provider Review of patient status, including review of consultant's reports, relevant laboratory and other test results, and medications completed Lipid Panel     Component Value Date/Time   CHOL 142 08/14/2022 1040   TRIG 108 08/14/2022 1040   HDL 57 08/14/2022 1040   CHOLHDL 2.5 08/14/2022 1040   CHOLHDL 4.6 10/12/2006 0840   VLDL 16  10/12/2006 0840   LDLCALC 65 08/14/2022 1040   LABVLDL 20 08/14/2022 1040         Other     To have tremors evaluated        Care Coordination Interventions: Evaluation of current treatment plan related to tremors in hands and knees and patient's adherence to plan as established by provider Discussed with patient her concerns regarding worsening hand tremors and knee tremors, discussed she is having difficulty feeding herself and is embarrassed to go out to eat, she is not driving due to having tremors in her knees Determined patient has not reported these symptoms to her PCP, she would like to have these symptoms evaluated and is fearful she has Parkinson's disease Educated patient about the basic disease process related to tremors including potential underlying conditions, diagnosis and treatment Determined patient would like to discuss her symptoms with her PCP at next scheduled visit in August Sent in basket message to [name] FNP advising of patient's reported symptoms/concerns         To lower A1c <7.0%        Care Coordination Interventions: Provided education to patient about basic DM disease process Review of patient status, including review of consultants reports, relevant laboratory and other test results, and medications completed Counseled on Diabetic diet, my plate method, 161 minutes of moderate intensity  exercise/week Determined patient has completed a SCAT application to assist with transportation, once completed, she will consider participating in the PREP program Assessed patient's understanding of A1c goal: <7% Lab Results  Component Value Date   HGBA1C 7.2 (H) 08/14/2022             Our next appointment is by telephone on 09/20/22 at 11:00 AM  Please call the care guide team at [phone] if you need to cancel or reschedule your appointment.   If you are experiencing a Mental Health or Behavioral Health Crisis or need someone to talk to, please go  to [facility] [address], [place] [phone])  Patient verbalizes understanding of instructions and care plan provided today and agrees to view in MyChart. Active MyChart status and patient understanding of how to access instructions and care plan via MyChart confirmed with patient.     Delsa Sale, RN, BSN, CCM Care Management Coordinator [facility] Care Management  Direct Phone: [phone]

## 2022-08-24 ENCOUNTER — Other Ambulatory Visit: Payer: Self-pay | Admitting: Nurse Practitioner

## 2022-08-24 MED ORDER — AIRSUPRA 90-80 MCG/ACT IN AERO: 2.0000 | INHALATION_SPRAY | Freq: Four times a day (QID) | RESPIRATORY_TRACT | 2 refills | Status: DC | PRN

## 2022-08-25 ENCOUNTER — Other Ambulatory Visit: Payer: Self-pay

## 2022-08-25 ENCOUNTER — Telehealth: Payer: Self-pay

## 2022-08-25 MED ORDER — AIRSUPRA 90-80 MCG/ACT IN AERO: 2.0000 | INHALATION_SPRAY | Freq: Four times a day (QID) | RESPIRATORY_TRACT | 2 refills | Status: DC | PRN

## 2022-08-25 NOTE — Telephone Encounter (Signed)
[employer] PA sent to plan.

## 2022-08-27 DIAGNOSIS — E559 Vitamin D deficiency, unspecified: Secondary | ICD-10-CM | POA: Insufficient documentation

## 2022-08-27 DIAGNOSIS — M109 Gout, unspecified: Secondary | ICD-10-CM | POA: Insufficient documentation

## 2022-08-27 DIAGNOSIS — F329 Major depressive disorder, single episode, unspecified: Secondary | ICD-10-CM | POA: Insufficient documentation

## 2022-08-28 DIAGNOSIS — I10 Essential (primary) hypertension: Secondary | ICD-10-CM | POA: Diagnosis not present

## 2022-08-28 DIAGNOSIS — E119 Type 2 diabetes mellitus without complications: Secondary | ICD-10-CM | POA: Diagnosis not present

## 2022-08-28 DIAGNOSIS — M1610 Unilateral primary osteoarthritis, unspecified hip: Secondary | ICD-10-CM | POA: Diagnosis not present

## 2022-08-28 DIAGNOSIS — E782 Mixed hyperlipidemia: Secondary | ICD-10-CM | POA: Diagnosis not present

## 2022-09-02 ENCOUNTER — Other Ambulatory Visit: Payer: Self-pay | Admitting: Nurse Practitioner

## 2022-09-06 ENCOUNTER — Ambulatory Visit: Payer: Self-pay | Admitting: Licensed Clinical Social Worker

## 2022-09-06 NOTE — Telephone Encounter (Signed)
Paulene Floor is denied because the information provided was not sufficient to support approval for medical necessity. The following required information was not provided and/or clarified: (1) If your diagnosis includes asthma. Reviewed by: Turner DanielsPh.

## 2022-09-07 NOTE — Patient Outreach (Signed)
  Care Coordination   Follow Up Visit Note   09/06/2022 Name: Joanna Sandoval MRN: [id number] DOB: 03/17/1946  Joanna Sandoval is a 77 y.o. year old female who sees [name], FNP for primary care. I spoke with  Lemmie Evens by phone today.  What matters to the patients health and wellness today?  Financial Strain, Transportation, Symptom Managment    Goals Addressed             This Visit's Progress    Obtain Supportive Resources   On track    Activities and task to complete in order to accomplish goals.   Patient will continue to utilize healthy coping skills to assist management of depression and/or anxiety Patient will continue to monitor diet to promote health management Utilize [facility] services for transportation needs. Contact [facility] to schedule transportation for medical appts               SDOH assessments and interventions completed:  No     Care Coordination Interventions:  Yes, provided  Interventions Today    Flowsheet Row Most Recent Value  Chronic Disease   Chronic disease during today's visit Hypertension (HTN), Diabetes  General Interventions   General Interventions Discussed/Reviewed General Interventions Reviewed, Doctor Visits  [Pt was approved for [facility]. Pt agreed to contact them about transportation for upcoming medical appt. Financial strain due to increase in rent. Pt utilizes [employer] card to assist with expenses]  Doctor Visits Discussed/Reviewed Doctor Visits Reviewed, Annual Wellness Visits  [Pt has an upcoming appt with Architectural technologist to visit home in May 26th between 12-4 PM]  Mental Health Interventions   Mental Health Discussed/Reviewed Mental Health Reviewed, Coping Strategies, Anxiety, Depression, Grief and Loss  [Processed feelings associated with tremors. Encouragement and validation discussed, in addition, to strategies to assist with managing anxiety]  Nutrition Interventions   Nutrition Discussed/Reviewed  Nutrition Reviewed  Pharmacy Interventions   Pharmacy Dicussed/Reviewed Pharmacy Topics Reviewed  Safety Interventions   Safety Discussed/Reviewed Safety Reviewed       Follow up plan: Follow up call scheduled for 4-6 weeks    Encounter Outcome:  Pt. Visit Completed   Jenel Lucks, MSW, LCSW [facility] Care Management [facility]  [facility] [email or web address] Phone [phone] 4:58 PM

## 2022-09-07 NOTE — Patient Instructions (Signed)
Visit Information  Thank you for taking time to visit with me today. Please don't hesitate to contact me if I can be of assistance to you.   Following are the goals we discussed today:   Goals Addressed             This Visit's Progress    Obtain Supportive Resources   On track    Activities and task to complete in order to accomplish goals.   Patient will continue to utilize healthy coping skills to assist management of depression and/or anxiety Patient will continue to monitor diet to promote health management Utilize [facility] services for transportation needs. Contact [facility] to schedule transportation for medical appts               Our next appointment is by telephone on 5/29 at 11 AM  Please call the care guide team at [phone] if you need to cancel or reschedule your appointment.   If you are experiencing a Mental Health or Behavioral Health Crisis or need someone to talk to, please call the Suicide and Crisis Lifeline: 988 call 911   Patient verbalizes understanding of instructions and care plan provided today and agrees to view in MyChart. Active MyChart status and patient understanding of how to access instructions and care plan via MyChart confirmed with patient.     Jenel Lucks, MSW, LCSW [facility] Management [place]  [facility] [email or web address] Phone [phone] 4:59 PM

## 2022-09-08 ENCOUNTER — Other Ambulatory Visit: Payer: Self-pay

## 2022-09-08 NOTE — Telephone Encounter (Signed)
Joanna Sandoval didn't meet requirements.

## 2022-09-13 ENCOUNTER — Encounter: Payer: Self-pay | Admitting: Licensed Clinical Social Worker

## 2022-09-14 ENCOUNTER — Telehealth: Payer: Self-pay | Admitting: Licensed Clinical Social Worker

## 2022-09-14 ENCOUNTER — Telehealth: Payer: Self-pay | Admitting: *Deleted

## 2022-09-14 ENCOUNTER — Ambulatory Visit (INDEPENDENT_AMBULATORY_CARE_PROVIDER_SITE_OTHER): Payer: Medicare Other

## 2022-09-14 VITALS — Ht 62.0 in | Wt 190.0 lb

## 2022-09-14 DIAGNOSIS — Z0000 Encounter for general adult medical examination without abnormal findings: Secondary | ICD-10-CM

## 2022-09-14 NOTE — Telephone Encounter (Signed)
   MARYN MOHER DOB: 1945-08-16 MRN: [id number]   RIDER WAIVER AND RELEASE OF LIABILITY  For purposes of improving physical access to our facilities, [facility] is pleased to partner with third parties to provide [facility] patients or other authorized individuals the option of convenient, on-demand ground transportation services (the [employer]") through use of the technology service that enables users to request on-demand ground transportation from independent third-party providers.  By opting to use and accept these [employer], I, the undersigned, hereby agree on behalf of myself, and on behalf of any minor child using the Science writer for whom I am the parent or legal guardian, as follows:  Science writer provided to me are provided by independent third-party transportation providers who are not [employer] or employees and who are unaffiliated with [employer]. [facility] is neither a transportation carrier nor a common or public carrier. [facility] has no control over the quality or safety of the transportation that occurs as a result of the [employer]. [facility] cannot guarantee that any third-party transportation provider will complete any arranged transportation service. [facility] makes no representation, warranty, or guarantee regarding the reliability, timeliness, quality, safety, suitability, or availability of any of the Transport Services or that they will be error free. I fully understand that traveling by vehicle involves risks and dangers of serious bodily injury, including permanent disability, paralysis, and death. I agree, on behalf of myself and on behalf of any minor child using the Transport Services for whom I am the parent or legal guardian, that the entire risk arising out of my use of the [employer] remains solely with me, to the maximum extent permitted under applicable law. The [employer] are provided  "as is" and "as available." [facility] disclaims all representations and warranties, express, implied or statutory, not expressly set out in these terms, including the implied warranties of merchantability and fitness for a particular purpose. I hereby waive and release [place] [facility], its agents, employees, officers, directors, representatives, insurers, attorneys, assigns, successors, subsidiaries, and affiliates from any and all past, present, or future claims, demands, liabilities, actions, causes of action, or suits of any kind directly or indirectly arising from acceptance and use of the [employer]. I further waive and release [facility] and its affiliates from all present and future liability and responsibility for any injury or death to persons or damages to property caused by or related to the use of the [employer]. I have read this Waiver and Release of Liability, and I understand the terms used in it and their legal significance. This Waiver is freely and voluntarily given with the understanding that my right (as well as the right of any minor child for whom I am the parent or legal guardian using the [employer]) to legal recourse against [facility] in connection with the [employer] is knowingly surrendered in return for use of these services.   I attest that I read the consent document to Lemmie Evens, gave Ms. Ashley Royalty the opportunity to ask questions and answered the questions asked (if any). I affirm that Lemmie Evens then provided consent for she's participation in this program.     Dione Booze

## 2022-09-14 NOTE — Patient Instructions (Signed)
Joanna Sandoval , Thank you for taking time to come for your Medicare Wellness Visit. I appreciate your ongoing commitment to your health goals. Please review the following plan we discussed and let me know if I can assist you in the future.   These are the goals we discussed:  Goals       Exercise 150 min/wk Moderate Activity (pt-stated)      Walking regularly.  Walking further than usual to to move more. Eat more healthy and cut down on medications      I am having some depression (pt-stated)      Care Coordination Interventions: Evaluation of current treatment plan related to depression and patient's adherence to plan as established by provider Determined patient has experienced increased stress and anxiety recently due to having her great grandson born early with Down Syndrome Active listening / Reflection utilized  Emotional Support Provided Noted patient has a follow up call with LCSW Jenel Lucks scheduled for 09/06/22 @11 :00 AM Sent in basket message to [facility] with a patient status update       I need to see a dentist (pt-stated)      Care Coordination Interventions: Evaluation of current treatment plan related to dental pain and patient's adherence to plan as established by provider Received inbound call from patient advising she underwent a tooth extraction on 06/26/22 performed by oral surgeon, Dr. Daine Gip  Determined patient states she is doing very well Reviewed and discussed prescribed medication regimen and answered patient's questions regarding when to take her medications Educated patient about the importance of monitoring blood sugars closely and to report abnormal readings or concerns to her doctor Determined patient will undergo 2 other future tooth extractions in preparation to be fitted for a partial denture      Manage My Medicine      Timeframe:  Long-Range Goal Priority:  Medium Start Date:                             Expected End Date:                        Follow Up Date: 03/14/2022  In Progress:   - call for medicine refill 2 or 3 days before it runs out - call if I am sick and can't take my medicine - keep a list of all the medicines I take; vitamins and herbals too - use a pillbox to sort medicine    Why is this important?   These steps will help you keep on track with your medicines.   Notes:  Please call if you have any questions.       Obtain Supportive Resources      Activities and task to complete in order to accomplish goals.   Patient will continue to utilize healthy coping skills to assist management of depression and/or anxiety Patient will continue to monitor diet to promote health management Utilize [facility] services for transportation needs. Contact [facility] to schedule transportation for medical appts             Patient Stated      07/30/2019, wants to be more active and do better with medication (remembering to take at bedtime)      Patient Stated      08/18/2020, wants to weigh 125-130 pounds and retire completely      Patient Stated      09/08/2021, lose  more weight and try to be healthy      Patient Stated      09/14/2022, wants to be able to walk normal without the cane      To have tremors evaluated      Care Coordination Interventions: Evaluation of current treatment plan related to tremors in hands and knees and patient's adherence to plan as established by provider Discussed with patient her concerns regarding worsening hand tremors and knee tremors, discussed she is having difficulty feeding herself and is embarrassed to go out to eat, she is not driving due to having tremors in her knees Determined patient has not reported these symptoms to her PCP, she would like to have these symptoms evaluated and is fearful she has Parkinson's disease Educated patient about the basic disease process related to tremors including potential underlying conditions, diagnosis and treatment Determined patient would like  to discuss her symptoms with her PCP at next scheduled visit in August Sent in basket message to [name] FNP advising of patient's reported symptoms/concerns         To lower A1c <7.0%      Care Coordination Interventions: Provided education to patient about basic DM disease process Review of patient status, including review of consultants reports, relevant laboratory and other test results, and medications completed Counseled on Diabetic diet, my plate method, 914 minutes of moderate intensity exercise/week Determined patient has completed a [employer] application to assist with transportation, once completed, she will consider participating in the PREP program Assessed patient's understanding of A1c goal: <7% Lab Results  Component Value Date   HGBA1C 7.2 (H) 08/14/2022             This is a list of the screening recommended for you and due dates:  Health Maintenance  Topic Date Due   Eye exam for diabetics  10/12/2021   COVID-19 Vaccine (6 - 2023-24 season) 12/16/2021   Flu Shot  11/16/2022   Hemoglobin A1C  02/13/2023   Yearly kidney function blood test for diabetes  08/14/2023   Yearly kidney health urinalysis for diabetes  08/14/2023   Complete foot exam   08/14/2023   Medicare Annual Wellness Visit  09/14/2023   Colon Cancer Screening  02/05/2028   DTaP/Tdap/Td vaccine (2 - Td or Tdap) 10/05/2028   Pneumonia Vaccine  Completed   DEXA scan (bone density measurement)  Completed   Hepatitis C Screening  Completed   Zoster (Shingles) Vaccine  Completed   HPV Vaccine  Aged Out    Advanced directives: Advance directive discussed with you today.   Conditions/risks identified: none  Next appointment: Follow up in one year for your annual wellness visit    Preventive Care 65 Years and Older, Female Preventive care refers to lifestyle choices and visits with your health care provider that can promote health and wellness. What does preventive care include? A yearly  physical exam. This is also called an annual well check. Dental exams once or twice a year. Routine eye exams. Ask your health care provider how often you should have your eyes checked. Personal lifestyle choices, including: Daily care of your teeth and gums. Regular physical activity. Eating a healthy diet. Avoiding tobacco and drug use. Limiting alcohol use. Practicing safe sex. Taking low-dose aspirin every day. Taking vitamin and mineral supplements as recommended by your health care provider. What happens during an annual well check? The services and screenings done by your health care provider during your annual well check will depend on  your age, overall health, lifestyle risk factors, and family history of disease. Counseling  Your health care provider may ask you questions about your: Alcohol use. Tobacco use. Drug use. Emotional well-being. Home and relationship well-being. Sexual activity. Eating habits. History of falls. Memory and ability to understand (cognition). Work and work Astronomer. Reproductive health. Screening  You may have the following tests or measurements: Height, weight, and BMI. Blood pressure. Lipid and cholesterol levels. These may be checked every 5 years, or more frequently if you are over 23 years old. Skin check. Lung cancer screening. You may have this screening every year starting at age 40 if you have a 30-pack-year history of smoking and currently smoke or have quit within the past 15 years. Fecal occult blood test (FOBT) of the stool. You may have this test every year starting at age 63. Flexible sigmoidoscopy or colonoscopy. You may have a sigmoidoscopy every 5 years or a colonoscopy every 10 years starting at age 3. Hepatitis C blood test. Hepatitis B blood test. Sexually transmitted disease (STD) testing. Diabetes screening. This is done by checking your blood sugar (glucose) after you have not eaten for a while (fasting). You may  have this done every 1-3 years. Bone density scan. This is done to screen for osteoporosis. You may have this done starting at age 5. Mammogram. This may be done every 1-2 years. Talk to your health care provider about how often you should have regular mammograms. Talk with your health care provider about your test results, treatment options, and if necessary, the need for more tests. Vaccines  Your health care provider may recommend certain vaccines, such as: Influenza vaccine. This is recommended every year. Tetanus, diphtheria, and acellular pertussis (Tdap, Td) vaccine. You may need a Td booster every 10 years. Zoster vaccine. You may need this after age 63. Pneumococcal 13-valent conjugate (PCV13) vaccine. One dose is recommended after age 26. Pneumococcal polysaccharide (PPSV23) vaccine. One dose is recommended after age 37. Talk to your health care provider about which screenings and vaccines you need and how often you need them. This information is not intended to replace advice given to you by your health care provider. Make sure you discuss any questions you have with your health care provider. Document Released: 04/30/2015 Document Revised: 12/22/2015 Document Reviewed: 02/02/2015 Elsevier Interactive Patient Education  2017 [employer].  Fall Prevention in the Home Falls can cause injuries. They can happen to people of all ages. There are many things you can do to make your home safe and to help prevent falls. What can I do on the outside of my home? Regularly fix the edges of walkways and driveways and fix any cracks. Remove anything that might make you trip as you walk through a door, such as a raised step or threshold. Trim any bushes or trees on the path to your home. Use bright outdoor lighting. Clear any walking paths of anything that might make someone trip, such as rocks or tools. Regularly check to see if handrails are loose or broken. Make sure that both sides of any  steps have handrails. Any raised decks and porches should have guardrails on the edges. Have any leaves, snow, or ice cleared regularly. Use sand or salt on walking paths during winter. Clean up any spills in your garage right away. This includes oil or grease spills. What can I do in the bathroom? Use night lights. Install grab bars by the toilet and in the tub and shower. Do not  use towel bars as grab bars. Use non-skid mats or decals in the tub or shower. If you need to sit down in the shower, use a plastic, non-slip stool. Keep the floor dry. Clean up any water that spills on the floor as soon as it happens. Remove soap buildup in the tub or shower regularly. Attach bath mats securely with double-sided non-slip rug tape. Do not have throw rugs and other things on the floor that can make you trip. What can I do in the bedroom? Use night lights. Make sure that you have a light by your bed that is easy to reach. Do not use any sheets or blankets that are too big for your bed. They should not hang down onto the floor. Have a firm chair that has side arms. You can use this for support while you get dressed. Do not have throw rugs and other things on the floor that can make you trip. What can I do in the kitchen? Clean up any spills right away. Avoid walking on wet floors. Keep items that you use a lot in easy-to-reach places. If you need to reach something above you, use a strong step stool that has a grab bar. Keep electrical cords out of the way. Do not use floor polish or wax that makes floors slippery. If you must use wax, use non-skid floor wax. Do not have throw rugs and other things on the floor that can make you trip. What can I do with my stairs? Do not leave any items on the stairs. Make sure that there are handrails on both sides of the stairs and use them. Fix handrails that are broken or loose. Make sure that handrails are as long as the stairways. Check any carpeting to  make sure that it is firmly attached to the stairs. Fix any carpet that is loose or worn. Avoid having throw rugs at the top or bottom of the stairs. If you do have throw rugs, attach them to the floor with carpet tape. Make sure that you have a light switch at the top of the stairs and the bottom of the stairs. If you do not have them, ask someone to add them for you. What else can I do to help prevent falls? Wear shoes that: Do not have high heels. Have rubber bottoms. Are comfortable and fit you well. Are closed at the toe. Do not wear sandals. If you use a stepladder: Make sure that it is fully opened. Do not climb a closed stepladder. Make sure that both sides of the stepladder are locked into place. Ask someone to hold it for you, if possible. Clearly mark and make sure that you can see: Any grab bars or handrails. First and last steps. Where the edge of each step is. Use tools that help you move around (mobility aids) if they are needed. These include: Canes. Walkers. Scooters. Crutches. Turn on the lights when you go into a dark area. Replace any light bulbs as soon as they burn out. Set up your furniture so you have a clear path. Avoid moving your furniture around. If any of your floors are uneven, fix them. If there are any pets around you, be aware of where they are. Review your medicines with your doctor. Some medicines can make you feel dizzy. This can increase your chance of falling. Ask your doctor what other things that you can do to help prevent falls. This information is not intended to replace advice given  to you by your health care provider. Make sure you discuss any questions you have with your health care provider. Document Released: 01/28/2009 Document Revised: 09/09/2015 Document Reviewed: 05/08/2014 Elsevier Interactive Patient Education  2017 [employer].

## 2022-09-14 NOTE — Progress Notes (Signed)
I connected with  Joanna Sandoval on 09/14/22 by a audio enabled telemedicine application and verified that I am speaking with the correct person using two identifiers.  Patient Location: Home  Provider Location: Office/Clinic  I discussed the limitations of evaluation and management by telemedicine. The patient expressed understanding and agreed to proceed.  Subjective:   Joanna Sandoval is a 77 y.o. female who presents for Medicare Annual (Subsequent) preventive examination.  Review of Systems     Cardiac Risk Factors include: advanced age (>28men, >73 women);diabetes mellitus;hypertension;obesity (BMI >30kg/m2)     Objective:    Today's Vitals   09/14/22 0812  Weight: 190 lb (86.2 kg)  Height: 5\' 2"  (1.575 m)  PainSc: 8    Body mass index is 34.75 kg/m.     09/14/2022    8:27 AM 11/30/2021    8:54 AM 09/08/2021    8:25 AM 08/18/2020    9:22 AM 10/17/2019    8:26 AM 07/30/2019    8:57 AM 06/19/2018    8:58 AM  Advanced Directives  Does Patient Have a Medical Advance Directive? No No No No No No No  Would patient like information on creating a medical advance directive?   No - Patient declined  No - Patient declined No - Patient declined Yes (MAU/Ambulatory/Procedural Areas - Information given)    Current Medications (verified) Outpatient Encounter Medications as of 09/14/2022  Medication Sig   Accu-Chek Softclix Lancets lancets USE AS DIRECTED   atenolol (TENORMIN) 100 MG tablet Take 50 mg by mouth daily.    Blood Glucose Monitoring Suppl (ACCU-CHEK GUIDE) w/Device KIT USE TO CHECK BLOOD SUGAR  TWICE DAILY AT 10AM AND 5PM   D3-50 50000 units capsule    diclofenac sodium (VOLTAREN) 1 % GEL Apply 2 g topically 4 (four) times daily.   dorzolamide-timolol (COSOPT) 2-0.5 % ophthalmic solution 1 drop 2 (two) times daily.   FARXIGA 10 MG TABS tablet TAKE 1 TABLET BY MOUTH DAILY  BEFORE BREAKFAST   glimepiride (AMARYL) 2 MG tablet TAKE 1 TABLET BY MOUTH IN THE  MORNING AND AT  BEDTIME   glucose blood (ACCU-CHEK GUIDE) test strip Use as instructed   [name] KIT Use to test blood sugar twice daily as directed. E11.65   latanoprost (XALATAN) 0.005 % ophthalmic solution 1 drop at bedtime.   losartan (COZAAR) 50 MG tablet Take 50 mg by mouth daily.   RYBELSUS 7 MG TABS TAKE 1 TABLET BY MOUTH DAILY   traMADol (ULTRAM) 50 MG tablet Take 50 mg by mouth every 6 (six) hours as needed.   albuterol (PROVENTIL HFA;VENTOLIN HFA) 108 (90 Base) MCG/ACT inhaler Inhale 2 puffs into the lungs every 6 (six) hours as needed. (Patient not taking: Reported on 09/14/2022)   Albuterol-Budesonide (AIRSUPRA) 90-80 MCG/ACT AERO Inhale 2 Inhalations into the lungs every 6 (six) hours as needed. (Patient not taking: Reported on 09/14/2022)   atorvastatin (LIPITOR) 20 MG tablet Take 1 tablet (20 mg total) by mouth daily.   COMIRNATY SUSP injection    magic mouthwash w/lidocaine SOLN Take 5 mLs by mouth 3 (three) times daily. 1 part each solution (Patient not taking: Reported on 09/14/2022)   ondansetron (ZOFRAN ODT) 4 MG disintegrating tablet Take 1 tablet (4 mg total) by mouth every 8 (eight) hours as needed for nausea or vomiting. (Patient not taking: Reported on 09/14/2022)   penicillin v potassium (VEETID) 500 MG tablet Take 500 mg by mouth 4 (four) times daily. (Patient not  taking: Reported on 09/14/2022)   No facility-administered encounter medications on file as of 09/14/2022.    Allergies (verified) Patient has no known allergies.   History: Past Medical History:  Diagnosis Date   Arthritis    Diabetes mellitus    Hypercholesteremia    Hypertension    Post-menopausal bleeding    Postmenopausal bleeding 08/30/2011   Normal EBX 09/2011.   Past Surgical History:  Procedure Laterality Date   CATARACT EXTRACTION     DILATION AND CURETTAGE OF UTERUS     HYSTEROSCOPY WITH D & C N/A 05/02/2017   Procedure: DILATATION AND CURETTAGE /HYSTEROSCOPY;  Surgeon:  Catalina Antigua, MD;  Location: [facility];  Service: Gynecology;  Laterality: N/A;   KNEE ARTHROSCOPY     Family History  Problem Relation Age of Onset   Hypertension Mother    Hypertension Father    Cancer Father        ?lung   Diabetes Sister    Social History   Socioeconomic History   Marital status: Divorced    Spouse name: Not on file   Number of children: Not on file   Years of education: Not on file   Highest education level: Not on file  Occupational History   Not on file  Tobacco Use   Smoking status: Former    Types: Cigarettes   Smokeless tobacco: Never  Vaping Use   Vaping Use: Never used  Substance and Sexual Activity   Alcohol use: No   Drug use: No   Sexual activity: Not Currently    Birth control/protection: Post-menopausal  Other Topics Concern   Not on file  Social History Narrative   Not on file   Social Determinants of Health   Financial Resource Strain: Medium Risk (09/14/2022)   Overall Financial Resource Strain (CARDIA)    Difficulty of Paying Living Expenses: Somewhat hard  Food Insecurity: No Food Insecurity (09/14/2022)   Hunger Vital Sign    Worried About Running Out of Food in the Last Year: Never true    Ran Out of Food in the Last Year: Never true  Transportation Needs: No Transportation Needs (09/14/2022)   PRAPARE - Administrator, Civil Service (Medical): No    Lack of Transportation (Non-Medical): No  Physical Activity: Inactive (09/14/2022)   Exercise Vital Sign    Days of Exercise per Week: 0 days    Minutes of Exercise per Session: 0 min  Stress: No Stress Concern Present (09/14/2022)   [employer] of Occupational Health - Occupational Stress Questionnaire    Feeling of Stress : Only a little  Social Connections: Moderately Isolated (02/16/2021)   Social Connection and Isolation Panel [NHANES]    Frequency of Communication with Friends and Family: More than three times a week    Frequency of  Social Gatherings with Friends and Family: Twice a week    Attends Religious Services: Never    Database administrator or Organizations: Yes    Attends Banker Meetings: Never    Marital Status: Divorced    Tobacco Counseling Counseling given: Not Answered   Clinical Intake:  Pre-visit preparation completed: Yes  Pain : 0-10 Pain Score: 8  Pain Type: Chronic pain Pain Location: Knee Pain Orientation: Left Pain Descriptors / Indicators: Aching Pain Onset: More than a month ago Pain Frequency: Constant     Nutritional Status: BMI > 30  Obese Nutritional Risks: None Diabetes: Yes  How often do you need to  have someone help you when you read instructions, pamphlets, or other written materials from your doctor or pharmacy?: 1 - Never  Diabetic? Yes Nutrition Risk Assessment:  Has the patient had any N/V/D within the last 2 months?  No  Does the patient have any non-healing wounds?  No  Has the patient had any unintentional weight loss or weight gain?  No   Diabetes:  Is the patient diabetic?  Yes  If diabetic, was a CBG obtained today?  No  Did the patient bring in their glucometer from home?  No  How often do you monitor your CBG's? daily.   Financial Strains and Diabetes Management:  Are you having any financial strains with the device, your supplies or your medication? No .  Does the patient want to be seen by Chronic Care Management for management of their diabetes?  No  Would the patient like to be referred to a Nutritionist or for Diabetic Management?  No   Diabetic Exams:  Diabetic Eye Exam: Overdue for diabetic eye exam. Pt has been advised about the importance in completing this exam. Patient advised to call and schedule an eye exam. Diabetic Foot Exam: Completed 08/14/2022   Interpreter Needed?: No  Information entered by :: NAllen LPN   Activities of Daily Living    09/14/2022    8:30 AM  In your present state of health, do you have  any difficulty performing the following activities:  Hearing? 1  Vision? 0  Difficulty concentrating or making decisions? 1  Walking or climbing stairs? 1  Dressing or bathing? 0  Doing errands, shopping? 1  Comment does not drive  Preparing Food and eating ? N  Using the Toilet? N  In the past six months, have you accidently leaked urine? Y  Do you have problems with loss of bowel control? N  Managing your Medications? N  Managing your Finances? N  Housekeeping or managing your Housekeeping? N    Patient Care Team: [name], FNP as PCP - General (General Practice) Clarene Duke, Karma Lew, RN as Case Manager [place], Pershing Cox, [facility] (Pharmacist) [facility]  Indicate any recent Medical Services you may have received from other than [facility] providers in the past year (date may be approximate).     Assessment:   This is a routine wellness examination for [name].  Hearing/Vision screen Vision Screening - Comments:: Regular eye exams, [facility]  Dietary issues and exercise activities discussed: Current Exercise Habits: Home exercise routine, Type of exercise: walking, Time (Minutes): 10, Frequency (Times/Week): 3, Weekly Exercise (Minutes/Week): 30   Goals Addressed             This Visit's Progress    Patient Stated       09/14/2022, wants to be able to walk normal without the cane       Depression Screen    09/14/2022    8:29 AM 08/14/2022    9:42 AM 05/22/2022    9:49 AM 04/13/2022    8:25 AM 01/26/2022    8:50 AM 12/12/2021    9:11 AM 12/12/2021    9:00 AM  PHQ 2/9 Scores  PHQ - 2 Score 1 0 0 0 0 3 0  PHQ- 9 Score  0 0 0  7     Fall Risk    09/14/2022    8:29 AM 08/14/2022    9:41 AM 05/22/2022    9:48 AM 01/26/2022    8:50 AM 12/12/2021  9:00 AM  Fall Risk   Falls in the past year? 0 0 0 0 0  Number falls in past yr: 0 0 0 0 0  Injury with Fall? 0 0 0 0 0  Risk for fall due to : Impaired mobility;Impaired balance/gait;Medication side  effect No Fall Risks No Fall Risks No Fall Risks No Fall Risks  Follow up Falls prevention discussed Falls evaluation completed Falls evaluation completed Falls evaluation completed Falls evaluation completed    FALL RISK PREVENTION PERTAINING TO THE HOME:  Any stairs in or around the home? No  If so, are there any without handrails? N/a Home free of loose throw rugs in walkways, pet beds, electrical cords, etc? Yes  Adequate lighting in your home to reduce risk of falls? Yes   ASSISTIVE DEVICES UTILIZED TO PREVENT FALLS:  Life alert? No  Use of a cane, walker or w/c? Yes  Grab bars in the bathroom? Yes  Shower chair or bench in shower? Yes  Elevated toilet seat or a handicapped toilet? No   TIMED UP AND GO:  Was the test performed? No .      Cognitive Function:        09/14/2022    8:33 AM 09/08/2021    8:30 AM 08/18/2020    9:28 AM 07/30/2019    9:06 AM 06/19/2018    9:08 AM  6CIT Screen  What Year? 0 points 0 points 0 points 0 points 0 points  What month? 0 points 0 points 0 points 0 points 0 points  What time? 0 points 3 points 3 points 3 points 3 points  Count back from 20 2 points 0 points 4 points 0 points 0 points  Months in reverse 0 points 4 points 2 points 4 points 2 points  Repeat phrase 4 points 4 points 4 points 4 points 0 points  Total Score 6 points 11 points 13 points 11 points 5 points    Immunizations Immunization History  Administered Date(s) Administered   Fluad Quad(high Dose 65+) 01/29/2020, 02/01/2021, 01/19/2022   Influenza, High Dose Seasonal PF 01/16/2018   Influenza,inj,Quad PF,6+ Mos 01/12/2019   PFIZER Comirnaty(Gray Top)Covid-19 Tri-Sucrose Vaccine 10/10/2021   PFIZER(Purple Top)SARS-COV-2 Vaccination 05/25/2019, 06/18/2019, 02/14/2020, 09/18/2020   PNEUMOCOCCAL CONJUGATE-20 08/09/2021   Pneumococcal Polysaccharide-23 08/04/2020   RSV,unspecified 04/15/2022   Tdap 10/06/2018   Zoster Recombinat (Shingrix) 02/08/2021, 04/23/2021     TDAP status: Up to date  Flu Vaccine status: Up to date  Pneumococcal vaccine status: Up to date  Covid-19 vaccine status: Completed vaccines  Qualifies for Shingles Vaccine? Yes   Zostavax completed Yes   Shingrix Completed?: Yes  Screening Tests Health Maintenance  Topic Date Due   OPHTHALMOLOGY EXAM  10/12/2021   COVID-19 Vaccine (6 - 2023-24 season) 12/16/2021   Medicare Annual Wellness (AWV)  09/09/2022   INFLUENZA VACCINE  11/16/2022   HEMOGLOBIN A1C  02/13/2023   Diabetic kidney evaluation - eGFR measurement  08/14/2023   Diabetic kidney evaluation - Urine ACR  08/14/2023   FOOT EXAM  08/14/2023   Colonoscopy  02/05/2028   DTaP/Tdap/Td (2 - Td or Tdap) 10/05/2028   Pneumonia Vaccine 89+ Years old  Completed   DEXA SCAN  Completed   Hepatitis C Screening  Completed   Zoster Vaccines- Shingrix  Completed   HPV VACCINES  Aged Out    Health Maintenance  Health Maintenance Due  Topic Date Due   OPHTHALMOLOGY EXAM  10/12/2021   COVID-19 Vaccine (6 - 2023-24 season)  12/16/2021   Medicare Annual Wellness (AWV)  09/09/2022    Colorectal cancer screening: No longer required.   Mammogram status: Completed 06/05/2022. Repeat every year  Bone Density status: Completed 07/16/2012.   Lung Cancer Screening: (Low Dose CT Chest recommended if Age 76-80 years, 30 pack-year currently smoking OR have quit w/in 15years.) does not qualify.   Lung Cancer Screening Referral: no  Additional Screening:  Hepatitis C Screening: does qualify; Completed 10/30/2018  Vision Screening: Recommended annual ophthalmology exams for early detection of glaucoma and other disorders of the eye. Is the patient up to date with their annual eye exam?  No  Who is the provider or what is the name of the office in which the patient attends annual eye exams? [facility] If pt is not established with a provider, would they like to be referred to a provider to establish care? No .   Dental  Screening: Recommended annual dental exams for proper oral hygiene  Community Resource Referral / Chronic Care Management: CRR required this visit?  No   CCM required this visit?  No      Plan:     I have personally reviewed and noted the following in the patient's chart:   Medical and social history Use of alcohol, tobacco or illicit drugs  Current medications and supplements including opioid prescriptions. Patient is not currently taking opioid prescriptions. Functional ability and status Nutritional status Physical activity Advanced directives List of other physicians Hospitalizations, surgeries, and ER visits in previous 12 months Vitals Screenings to include cognitive, depression, and falls Referrals and appointments  In addition, I have reviewed and discussed with patient certain preventive protocols, quality metrics, and best practice recommendations. A written personalized care plan for preventive services as well as general preventive health recommendations were provided to patient.     [name], LPN   07/24/8117   Nurse Notes: none  Due to this being a virtual visit, the after visit summary with patients personalized plan was offered to patient via mail or my-chart. to pick up at office at next visit

## 2022-09-14 NOTE — Patient Outreach (Signed)
  Care Coordination   09/14/2022 Name: Joanna Sandoval MRN: [id number] DOB: [birth year]   Care Coordination Outreach Attempts:  An unsuccessful telephone outreach was attempted for a scheduled appointment today.  Follow Up Plan:  Additional outreach attempts will be made to offer the patient care coordination information and services.   Encounter Outcome:  No Answer   Care Coordination Interventions:  No, not indicated    Jenel Lucks, MSW, LCSW [facility] Care Management [place]  [facility] [email or web address] Phone [phone] 8:31 AM

## 2022-09-14 NOTE — Telephone Encounter (Signed)
   Telephone encounter was:  Successful.  09/14/2022 Name: MERARI SULT MRN: [id number] DOB: 1945/07/09  MYKYLA FRANKLAND is a 77 y.o. year old female who is a primary care patient of [name], FNP . The community resource team was consulted for assistance with Food InsecurityBooked [facility], and refered through [place] [employer] to [employer] and food bank also provided moms meals for 6 months   Care guide performed the following interventions: Patient provided with information about care guide support team and interviewed to confirm resource needs.  Follow Up Plan:  No further follow up planned at this time. The patient has been provided with needed resources.  Yehuda Mao Greenauer -[facility], Population Health [phone] [address] [place] , [place] [address] Email : [email or web address]

## 2022-09-20 DIAGNOSIS — H401131 Primary open-angle glaucoma, bilateral, mild stage: Secondary | ICD-10-CM | POA: Diagnosis not present

## 2022-09-26 ENCOUNTER — Ambulatory Visit: Payer: Self-pay

## 2022-09-26 NOTE — Patient Outreach (Signed)
  Care Coordination   Follow Up Visit Note   09/26/2022 Name: Joanna Sandoval MRN: [id number] DOB: 24-Dec-1945  Joanna Sandoval is a 77 y.o. year old female who sees [name], FNP for primary care. I spoke with  Joanna Sandoval by phone today.  What matters to the patients health and wellness today?  Patient would like to speak with a LCSW for counseling, resources for transportation and rent increase.     Goals Addressed               This Visit's Progress     Patient Stated     I am having some depression (pt-stated)        Care Coordination Interventions: Evaluation of current treatment plan related to depression and patient's adherence to plan as established by provider Discussed with patient that she continues to experience increased stress and anxiety due to having family stress related to her new great-grandson's early arrival with down syndrome, he has been in and out of the hospital due to having his oxygen levels drop Active listening / Reflection utilized  Emotional Support Provided Discussed with patient that she missed the call from Joanna Lucks, LCSW but would like to speak with her Coordinated with Joanna Sandoval an additional call to further assist patient with support of family stress and depression, discussed with Joanna Sandoval that patient is also requesting additional assistance with transportation and paying her rent due to recent increase Confirmed patient will be available to take Jasmine's call tomorrow, 09/27/22 @11 :00 AM Sent Jasmine an in basket message to provide additional details     Interventions Today    Flowsheet Row Most Recent Value  Chronic Disease   Chronic disease during today's visit Other  [Glaucoma,  tremors]  General Interventions   General Interventions Discussed/Reviewed General Interventions Discussed, General Interventions Reviewed, Doctor Visits  Doctor Visits Discussed/Reviewed Doctor Visits Discussed, Doctor Visits Reviewed,  Specialist  Education Interventions   Education Provided Provided Education  Provided Verbal Education On When to see the doctor, Medication  Mental Health Interventions   Mental Health Discussed/Reviewed Mental Health Discussed, Mental Health Reviewed, Coping Strategies  Pharmacy Interventions   Pharmacy Dicussed/Reviewed Pharmacy Topics Discussed, Pharmacy Topics Reviewed, Medications and their functions          SDOH assessments and interventions completed:  No     Care Coordination Interventions:  Yes, provided   Follow up plan: Referral made to Joanna Lucks LCSW for support and counseling, other SDOH needs including transportation, rent increase  Follow up call scheduled for 12/19/22 @10 :30 AM    Encounter Outcome:  Pt. Visit Completed

## 2022-09-26 NOTE — Patient Instructions (Signed)
Visit Information  Thank you for taking time to visit with me today. Please don't hesitate to contact me if I can be of assistance to you.   Following are the goals we discussed today:   Goals Addressed               This Visit's Progress     Patient Stated     I am having some depression (pt-stated)        Care Coordination Interventions: Evaluation of current treatment plan related to depression and patient's adherence to plan as established by provider Discussed with patient that she continues to experience increased stress and anxiety due to having family stress related to her new great-grandson's early arrival with down syndrome, he has been in and out of the hospital due to having his oxygen levels drop Active listening / Reflection utilized  Emotional Support Provided Discussed with patient that she missed the call from Jenel Lucks, LCSW but would like to speak with her Coordinated with Leavy Cella an additional call to further assist patient with support of family stress and depression, discussed with Leavy Cella that patient is also requesting additional assistance with transportation and paying her rent due to recent increase Confirmed patient will be available to take Jasmine's call tomorrow, 09/27/22 @11 :00 AM Sent Jasmine an in basket message to provide additional details         Our next appointment is by telephone on 12/19/22 at 10:30 AM  Please call the care guide team at [phone] if you need to cancel or reschedule your appointment.   If you are experiencing a Mental Health or Behavioral Health Crisis or need someone to talk to, please call 1-800-273-TALK (toll free, 24 hour hotline)  Patient verbalizes understanding of instructions and care plan provided today and agrees to view in MyChart. Active MyChart status and patient understanding of how to access instructions and care plan via MyChart confirmed with patient.     Delsa Sale, RN, BSN, CCM Care Management  Coordinator [facility] Care Management  Direct Phone: [phone]

## 2022-09-27 ENCOUNTER — Ambulatory Visit: Payer: Self-pay | Admitting: Licensed Clinical Social Worker

## 2022-09-29 NOTE — Patient Instructions (Signed)
Visit Information  Thank you for taking time to visit with me today. Please don't hesitate to contact me if I can be of assistance to you.   Following are the goals we discussed today:   Goals Addressed             This Visit's Progress    Obtain Supportive Resources   On track    Activities and task to complete in order to accomplish goals.   Patient will continue to utilize healthy coping skills to assist management of depression and/or anxiety Patient will continue to monitor diet to promote health management Access [place] application was denied. Contact [facility] to schedule transportation for medical appts Continue to comply with medications, including eye drops               Our next appointment is by telephone on 07/24 at 11 AM  Please call the care guide team at [phone] if you need to cancel or reschedule your appointment.   If you are experiencing a Mental Health or Behavioral Health Crisis or need someone to talk to, please call the Suicide and Crisis Lifeline: 988 call 911   Patient verbalizes understanding of instructions and care plan provided today and agrees to view in MyChart. Active MyChart status and patient understanding of how to access instructions and care plan via MyChart confirmed with patient.     Jenel Lucks, MSW, LCSW [facility] Care Management [place]  [facility] [email or web address] Phone [phone] 5:29 AM

## 2022-09-29 NOTE — Patient Outreach (Signed)
  Care Coordination   Follow Up Visit Note   09/27/2022 Name: Joanna Sandoval MRN: [id number] DOB: Sep 07, 1945  Joanna Sandoval is a 77 y.o. year old female who sees [name], FNP for primary care. I spoke with  Lemmie Evens by phone today.  What matters to the patients health and wellness today?  Symptom Management and Transportation    Goals Addressed             This Visit's Progress    Obtain Supportive Resources   On track    Activities and task to complete in order to accomplish goals.   Patient will continue to utilize healthy coping skills to assist management of depression and/or anxiety Patient will continue to monitor diet to promote health management [place] application was denied. Contact [facility] to schedule transportation for medical appts Continue to comply with medications, including eye drops               SDOH assessments and interventions completed:  No     Care Coordination Interventions:  Yes, provided  Interventions Today    Flowsheet Row Most Recent Value  Chronic Disease   Chronic disease during today's visit Hypertension (HTN), Diabetes, Other  [PAD, Depression]  General Interventions   General Interventions Discussed/Reviewed General Interventions Reviewed, Doctor Visits, [facility], Communication with  Doctor Visits Discussed/Reviewed Specialist, Doctor Visits Reviewed  [Pt has completed appts with Dr. Dione Booze (eyes) Cardiology and Annual visit with [employer] Nurse]  Communication with PCP/Specialists  [LCSW will send message regarding pt's request for referral for PT and/or Ortho to assist with arthritis in knees]  Mental Health Interventions   Mental Health Discussed/Reviewed Mental Health Reviewed, Coping Strategies, Depression, Anxiety  [Triggers to stress identified and healthy coping skills discussed to decrease symptoms. Pt continues to participate in therapy and utilizes strong support system. Validation  and encouragement provided]  Nutrition Interventions   Nutrition Discussed/Reviewed Nutrition Reviewed  [Patient receives 20 meals a month to supplement decrease in food stamps]  Pharmacy Interventions   Pharmacy Dicussed/Reviewed Pharmacy Topics Reviewed, Medication Adherence  [Patient is logging medications taken, to assist with compliance]  Safety Interventions   Safety Discussed/Reviewed Safety Reviewed       Follow up plan: Follow up call scheduled for 4-6 weeks    Encounter Outcome:  Pt. Visit Completed   Jenel Lucks, MSW, LCSW [facility] Care Management [facility]  [facility] [email or web address] Phone [phone] 5:28 AM

## 2022-11-08 ENCOUNTER — Ambulatory Visit: Payer: Self-pay | Admitting: Licensed Clinical Social Worker

## 2022-11-09 NOTE — Patient Instructions (Signed)
Visit Information  Thank you for taking time to visit with me today. Please don't hesitate to contact me if I can be of assistance to you.   Following are the goals we discussed today:   Goals Addressed             This Visit's Progress    Obtain Supportive Resources   On track    Activities and task to complete in order to accomplish goals.   Patient will continue to utilize healthy coping skills to assist management of depression and/or anxiety Patient will continue to monitor diet to promote health management [place] application was denied. Contact [facility] to schedule transportation for medical appts Continue to comply with medications, including eye drops               Our next appointment is by telephone on 09/24 at 11 AM  Please call the care guide team at [phone] if you need to cancel or reschedule your appointment.   If you are experiencing a Mental Health or Behavioral Health Crisis or need someone to talk to, please call the Suicide and Crisis Lifeline: 988 call 911   Patient verbalizes understanding of instructions and care plan provided today and agrees to view in MyChart. Active MyChart status and patient understanding of how to access instructions and care plan via MyChart confirmed with patient.     Jenel Lucks, MSW, LCSW [facility] Care Management [place]  [facility] [email or web address] Phone [phone] 10:53 AM

## 2022-11-09 NOTE — Patient Outreach (Signed)
  Care Coordination   Follow Up Visit Note   11/08/2022 Name: Joanna Sandoval MRN: [id number] DOB: [birth year]  Joanna Sandoval is a 77 y.o. year old female who sees [name], FNP for primary care. I spoke with  Lemmie Evens by phone today.  What matters to the patients health and wellness today?  Grief    Goals Addressed             This Visit's Progress    Obtain Supportive Resources   On track    Activities and task to complete in order to accomplish goals.   Patient will continue to utilize healthy coping skills to assist management of depression and/or anxiety Patient will continue to monitor diet to promote health management [place] application was denied. Contact [facility] to schedule transportation for medical appts Continue to comply with medications, including eye drops               SDOH assessments and interventions completed:  No     Care Coordination Interventions:  Yes, provided  Interventions Today    Flowsheet Row Most Recent Value  Chronic Disease   Chronic disease during today's visit Hypertension (HTN), Diabetes, Other  [PAD and Reactive Depression]  General Interventions   General Interventions Discussed/Reviewed General Interventions Reviewed, Doctor Visits  Doctor Visits Discussed/Reviewed Doctor Visits Reviewed  Mental Health Interventions   Mental Health Discussed/Reviewed Mental Health Reviewed, Coping Strategies, Grief and Loss  Deboraha Sprang of friend Has virtual therapist meets weekly on Wednesday AD something.]  Nutrition Interventions   Nutrition Discussed/Reviewed Nutrition Reviewed  Pharmacy Interventions   Pharmacy Dicussed/Reviewed Pharmacy Topics Reviewed, Medication Adherence  [Writing down when she takes her meds to assist with memory.]  Safety Interventions   Safety Discussed/Reviewed Safety Reviewed, Fall Risk, Home Safety  [Has lifeline covered through insurance for in and out of the home to promote safety.]  Home  Safety Assistive Devices       Follow up plan: Follow up call scheduled for 4-6 weeks    Encounter Outcome:  Pt. Visit Completed   Jenel Lucks, MSW, LCSW [facility] Care Management [facility]  [facility] [email or web address] Phone [phone] 10:52 AM

## 2022-11-21 ENCOUNTER — Emergency Department (HOSPITAL_COMMUNITY)
Admission: EM | Admit: 2022-11-21 | Discharge: 2022-11-21 | Disposition: A | Discharge status: Home / Self Care | Payer: Medicare Other | Attending: Emergency Medicine | Admitting: Emergency Medicine

## 2022-11-21 ENCOUNTER — Encounter (HOSPITAL_COMMUNITY): Payer: Self-pay

## 2022-11-21 ENCOUNTER — Other Ambulatory Visit: Payer: Self-pay

## 2022-11-21 ENCOUNTER — Emergency Department (HOSPITAL_COMMUNITY): Payer: Medicare Other

## 2022-11-21 DIAGNOSIS — R0989 Other specified symptoms and signs involving the circulatory and respiratory systems: Secondary | ICD-10-CM | POA: Diagnosis not present

## 2022-11-21 DIAGNOSIS — I10 Essential (primary) hypertension: Secondary | ICD-10-CM | POA: Diagnosis not present

## 2022-11-21 DIAGNOSIS — R1031 Right lower quadrant pain: Secondary | ICD-10-CM | POA: Diagnosis not present

## 2022-11-21 DIAGNOSIS — R109 Unspecified abdominal pain: Secondary | ICD-10-CM | POA: Diagnosis not present

## 2022-11-21 DIAGNOSIS — Z7984 Long term (current) use of oral hypoglycemic drugs: Secondary | ICD-10-CM | POA: Insufficient documentation

## 2022-11-21 DIAGNOSIS — K449 Diaphragmatic hernia without obstruction or gangrene: Secondary | ICD-10-CM | POA: Diagnosis not present

## 2022-11-21 DIAGNOSIS — Z87891 Personal history of nicotine dependence: Secondary | ICD-10-CM | POA: Insufficient documentation

## 2022-11-21 DIAGNOSIS — N200 Calculus of kidney: Secondary | ICD-10-CM | POA: Diagnosis not present

## 2022-11-21 DIAGNOSIS — R059 Cough, unspecified: Secondary | ICD-10-CM | POA: Diagnosis not present

## 2022-11-21 DIAGNOSIS — Z79899 Other long term (current) drug therapy: Secondary | ICD-10-CM | POA: Diagnosis not present

## 2022-11-21 DIAGNOSIS — J9811 Atelectasis: Secondary | ICD-10-CM | POA: Diagnosis not present

## 2022-11-21 DIAGNOSIS — D72829 Elevated white blood cell count, unspecified: Secondary | ICD-10-CM | POA: Diagnosis not present

## 2022-11-21 DIAGNOSIS — E119 Type 2 diabetes mellitus without complications: Secondary | ICD-10-CM | POA: Insufficient documentation

## 2022-11-21 DIAGNOSIS — R000 Tachycardia, unspecified: Secondary | ICD-10-CM | POA: Diagnosis not present

## 2022-11-21 DIAGNOSIS — K429 Umbilical hernia without obstruction or gangrene: Secondary | ICD-10-CM | POA: Diagnosis not present

## 2022-11-21 LAB — CBC
HCT: 41.4 % (ref 36.0–46.0)
Hemoglobin: 13.1 g/dL (ref 12.0–15.0)
MCH: 28 pg (ref 26.0–34.0)
MCHC: 31.6 g/dL (ref 30.0–36.0)
MCV: 88.5 fL (ref 80.0–100.0)
Platelets: 288 10*3/uL (ref 150–400)
RBC: 4.68 MIL/uL (ref 3.87–5.11)
RDW: 20.5 % — ABNORMAL HIGH (ref 11.5–15.5)
WBC: 16.5 10*3/uL — ABNORMAL HIGH (ref 4.0–10.5)
nRBC: 0 % (ref 0.0–0.2)

## 2022-11-21 LAB — BASIC METABOLIC PANEL
Anion gap: 17 — ABNORMAL HIGH (ref 5–15)
BUN: 14 mg/dL (ref 8–23)
CO2: 25 mmol/L (ref 22–32)
Calcium: 9.5 mg/dL (ref 8.9–10.3)
Chloride: 99 mmol/L (ref 98–111)
Creatinine, Ser: 0.92 mg/dL (ref 0.44–1.00)
GFR, Estimated: >60 mL/min (ref >60)
Glucose, Bld: 121 mg/dL — ABNORMAL HIGH (ref 70–99)
Potassium: 3.9 mmol/L (ref 3.5–5.1)
Sodium: 141 mmol/L (ref 135–145)

## 2022-11-21 LAB — URINALYSIS, ROUTINE W REFLEX MICROSCOPIC
Bilirubin Urine: NEGATIVE
Glucose, UA: >=500 mg/dL — AB
Hgb urine dipstick: NEGATIVE
Ketones, ur: NEGATIVE mg/dL
Leukocytes,Ua: NEGATIVE
Nitrite: NEGATIVE
Protein, ur: 30 mg/dL — AB
Specific Gravity, Urine: 1.026 (ref 1.005–1.030)
pH: 5 (ref 5.0–8.0)

## 2022-11-21 LAB — HEPATIC FUNCTION PANEL
ALT: 17 U/L (ref 0–44)
AST: 23 U/L (ref 15–41)
Albumin: 3.7 g/dL (ref 3.5–5.0)
Alkaline Phosphatase: 115 U/L (ref 38–126)
Bilirubin, Direct: 0.4 mg/dL — ABNORMAL HIGH (ref 0.0–0.2)
Indirect Bilirubin: 1.3 mg/dL — ABNORMAL HIGH (ref 0.3–0.9)
Total Bilirubin: 1.7 mg/dL — ABNORMAL HIGH (ref 0.3–1.2)
Total Protein: 7.7 g/dL (ref 6.5–8.1)

## 2022-11-21 LAB — LIPASE, BLOOD: Lipase: 34 U/L (ref 11–51)

## 2022-11-21 MED ORDER — OXYCODONE-ACETAMINOPHEN 5-325 MG PO TABS
1.0000 | ORAL_TABLET | Freq: Once | ORAL | Status: AC
  Administered 2022-11-21: 1 via ORAL
  Filled 2022-11-21: qty 1

## 2022-11-21 MED ORDER — IOHEXOL 350 MG/ML SOLN
75.0000 mL | Freq: Once | INTRAVENOUS | Status: AC | PRN
  Administered 2022-11-21: 75 mL via INTRAVENOUS

## 2022-11-21 MED ORDER — SODIUM CHLORIDE 0.9 % IV BOLUS
1000.0000 mL | Freq: Once | INTRAVENOUS | Status: AC
  Administered 2022-11-21: 1000 mL via INTRAVENOUS

## 2022-11-21 NOTE — ED Provider Notes (Signed)
[place] EMERGENCY DEPARTMENT AT [facility] Provider Note  CSN: [id number] Arrival date & time: 11/21/22 1425  Chief Complaint(s) Flank Pain  HPI Joanna Sandoval is a 77 y.o. female history of diabetes, hypertension, hyperlipidemia presenting to the emergency department with right-sided flank pain.  She reports that she developed pain last night.  Denies urinary symptoms.  Reports chronic unchanged cough.  Pain radiates to the abdomen.  No shortness of breath, chest pain.  No fevers or chills.  No urinary symptoms.  No lightheadedness or dizziness.  No fainting.  Denies similar symptoms in the past.  Worse with movement.   Past Medical History Past Medical History:  Diagnosis Date   Arthritis    Diabetes mellitus    Hypercholesteremia    Hypertension    Post-menopausal bleeding    Postmenopausal bleeding 08/30/2011   Normal EBX 09/2011.   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 Authorizing Provider  Accu-Chek Softclix Lancets lancets USE AS DIRECTED 09/05/22   [name], FNP  albuterol (PROVENTIL HFA;VENTOLIN HFA) 108 (90 Base) MCG/ACT inhaler Inhale 2 puffs into the lungs every 6 (six) hours as needed. Patient not taking: Reported on 09/14/2022    [provider]  Albuterol-Budesonide (AIRSUPRA) 90-80 MCG/ACT AERO Inhale 2 Inhalations into the lungs every 6 (six) hours as needed. Patient not taking: Reported on 09/14/2022 08/25/22    [name], FNP  atenolol (TENORMIN) 100 MG tablet Take 50 mg by mouth daily.     [provider]  atorvastatin (LIPITOR) 20 MG tablet Take 1 tablet (20 mg total) by mouth daily. 12/01/20 08/14/22  [name], FNP  Blood Glucose Monitoring Suppl (ACCU-CHEK GUIDE) w/Device KIT USE TO CHECK BLOOD SUGAR  TWICE DAILY AT 10AM AND 5PM 05/10/21   [name], FNP  COMIRNATY SUSP injection  02/22/22   [provider]  D3-50 50000 units capsule  01/14/17   [provider]  diclofenac sodium (VOLTAREN) 1 % GEL Apply 2 g topically 4 (four) times daily. 10/30/18   [name], FNP  dorzolamide-timolol (COSOPT) 2-0.5 % ophthalmic solution 1 drop 2 (two) times daily. 05/10/22   [provider]  FARXIGA 10 MG TABS tablet TAKE 1 TABLET BY MOUTH DAILY  BEFORE BREAKFAST 01/11/22   [name], FNP  glimepiride (AMARYL) 2 MG tablet TAKE 1 TABLET BY MOUTH IN THE  MORNING AND AT BEDTIME 04/03/22   [name], FNP  glucose blood (ACCU-CHEK GUIDE) test strip Use as instructed 05/17/21   [name], FNP  [name] KIT Use to test blood sugar twice daily as directed. E11.65 05/02/19   [name], FNP  latanoprost (XALATAN) 0.005 % ophthalmic solution 1 drop at bedtime. 05/16/19   [provider]  losartan (COZAAR) 50 MG tablet Take 50 mg by mouth daily.    [provider]  magic mouthwash w/lidocaine SOLN Take 5 mLs by mouth 3 (three) times daily. 1 part each solution Patient not taking: Reported on 09/14/2022 02/07/22  [name], FNP  ondansetron (ZOFRAN ODT) 4 MG disintegrating tablet Take 1 tablet (4 mg total) by mouth every 8 (eight) hours as needed for nausea or vomiting. Patient not taking: Reported on 09/14/2022 11/29/20   Tanda Rockers, PA-C  penicillin v potassium (VEETID) 500 MG tablet Take 500 mg by mouth 4 (four) times daily. Patient not taking: Reported on 09/14/2022 04/28/22   [provider]  RYBELSUS  7 MG TABS TAKE 1 TABLET BY MOUTH DAILY 02/15/22   [name], FNP  traMADol (ULTRAM) 50 MG tablet Take 50 mg by mouth every 6 (six) hours as needed. 04/28/22   [provider]                                                                                                                                    Past Surgical History Past Surgical History:  Procedure Laterality Date   CATARACT EXTRACTION     DILATION AND CURETTAGE OF UTERUS     HYSTEROSCOPY WITH D & C N/A 05/02/2017   Procedure: DILATATION AND CURETTAGE /HYSTEROSCOPY;  Surgeon: Catalina Antigua, MD;  Location: [facility];  Service: Gynecology;  Laterality: N/A;   KNEE ARTHROSCOPY     Family History Family History  Problem Relation Age of Onset   Hypertension Mother    Hypertension Father    Cancer Father        ?lung   Diabetes Sister     Social History Social History   Tobacco Use   Smoking status: Former    Types: Cigarettes   Smokeless tobacco: Never  Vaping Use   Vaping status: Never Used  Substance Use Topics   Alcohol use: No   Drug use: No   Allergies Patient has no known allergies.  Review of Systems Review of Systems  All other systems reviewed and are negative.   Physical Exam Vital Signs  I have reviewed the triage vital signs BP (!) 145/69   Pulse 86   Temp 98.8 F (37.1 C) (Oral)   Resp 20   Ht 5\' 2"  (1.575 m)   Wt 86.2 kg   SpO2 97%   BMI 34.75 kg/m  Physical Exam Vitals and nursing note reviewed.  Constitutional:      General: She is not in acute distress.    Appearance: She is well-developed.  HENT:     Head: Normocephalic and atraumatic.     Mouth/Throat:     Mouth: Mucous membranes are moist.  Eyes:     Pupils: Pupils are equal, round, and reactive to light.  Cardiovascular:     Rate and Rhythm: Normal rate and regular rhythm.     Heart sounds: No murmur heard. Pulmonary:     Effort: Pulmonary effort is normal. No respiratory distress.      Breath sounds: Normal breath sounds.  Abdominal:     General: Abdomen is flat.     Palpations:  Abdomen is soft.     Tenderness: There is no abdominal tenderness. There is right CVA tenderness. There is no left CVA tenderness.  Musculoskeletal:        General: No tenderness.     Right lower leg: No edema.     Left lower leg: No edema.     Comments: Tenderness to the right lower chest wall which reproduces the patient's pain  Skin:    General: Skin is warm and dry.  Neurological:     General: No focal deficit present.     Mental Status: She is alert. Mental status is at baseline.  Psychiatric:        Mood and Affect: Mood normal.        Behavior: Behavior normal.     ED Results and Treatments Labs (all labs ordered are listed, but only abnormal results are displayed) Labs Reviewed  URINALYSIS, ROUTINE W REFLEX MICROSCOPIC - Abnormal; Notable for the following components:      Result Value   APPearance HAZY (*)    Glucose, UA >=500 (*)    Protein, ur 30 (*)    Bacteria, UA RARE (*)    All other components within normal limits  BASIC METABOLIC PANEL - Abnormal; Notable for the following components:   Glucose, Bld 121 (*)    Anion gap 17 (*)    All other components within normal limits  CBC - Abnormal; Notable for the following components:   WBC 16.5 (*)    RDW 20.5 (*)    All other components within normal limits  HEPATIC FUNCTION PANEL - Abnormal; Notable for the following components:   Total Bilirubin 1.7 (*)    Bilirubin, Direct 0.4 (*)    Indirect Bilirubin 1.3 (*)    All other components within normal limits  LIPASE, BLOOD                                                                                                                          Radiology CT ABDOMEN PELVIS W CONTRAST  Result Date: 11/21/2022 CLINICAL DATA:  Abdominal pain, right flank pain EXAM: CT ABDOMEN AND PELVIS WITH CONTRAST TECHNIQUE: Multidetector CT imaging of the abdomen and pelvis was  performed using the standard protocol following bolus administration of intravenous contrast. RADIATION DOSE REDUCTION: This exam was performed according to the departmental dose-optimization program which includes automated exposure control, adjustment of the mA and/or kV according to patient size and/or use of iterative reconstruction technique. CONTRAST:  75mL OMNIPAQUE IOHEXOL 350 MG/ML SOLN COMPARISON:  Previous studies including the examination of 11/29/2020 FINDINGS: Lower chest: There is peribronchial thickening in the posterior lower lung fields. There is slight prominence of interstitial markings in the posterior lower lung fields. Similar findings were seen on the previous study suggesting scarring. Hepatobiliary: No focal abnormalities are seen in liver. There is no dilation of bile ducts. Gallbladder is unremarkable. Pancreas: No focal abnormality is seen. Spleen: Unremarkable. Adrenals/Urinary Tract: Adrenals are unremarkable. There  is no hydronephrosis. There is 5 mm left renal calculus. There is tiny punctate calculus in the lower pole of right kidney. Ureters are not dilated. Urinary bladder is not distended. Stomach/Bowel: Small hiatal hernia is seen. Stomach is not distended. Small bowel loops are not dilated. The appendix is not dilated. There is no significant wall thickening in colon. There is no pericolic stranding. Vascular/Lymphatic: There are scattered calcifications in aorta and its major branches. Reproductive: Uterus is enlarged with multiple fibroids. No adnexal masses are seen. Other: There is no ascites or pneumoperitoneum. Umbilical hernia containing fat is seen. Musculoskeletal: There is first-degree anterolisthesis at L4-L5 level. A spinal stenosis at the L4-L5 level. There is narrowing of neural foramina at multiple levels. IMPRESSION: There is no evidence of intestinal obstruction or pneumoperitoneum. There is no hydronephrosis. The appendix is not dilated. Small hiatal hernia.  Small bilateral renal stones. Uterine fibroids. Lumbar spondylosis. Electronically Signed   By: Ernie Avena M.D.   On: 11/21/2022 17:13   DG Chest Port 1 View  Result Date: 11/21/2022 CLINICAL DATA:  Cough. EXAM: PORTABLE CHEST 1 VIEW COMPARISON:  Radiograph 10/29/2014.  No other comparison studies. FINDINGS: 1527 hours. There are slightly lower lung volumes with resulting mild atelectasis at both lung bases. The lungs are otherwise clear, without edema, confluent airspace opacity or pleural effusion. There is no pneumothorax. The heart size and mediastinal contours are stable. Mild degenerative changes in the spine without acute osseous findings. Telemetry leads overlie the chest. IMPRESSION: Mild bibasilar atelectasis. No evidence of pneumonia or edema. Electronically Signed   By: Carey Bullocks M.D.   On: 11/21/2022 16:04    Pertinent labs & imaging results that were available during my care of the patient were reviewed by me and considered in my medical decision making (see MDM for details).  Medications Ordered in ED Medications  sodium chloride 0.9 % bolus 1,000 mL (0 mLs Intravenous Stopped 11/21/22 1730)  oxyCODONE-acetaminophen (PERCOCET/ROXICET) 5-325 MG per tablet 1 tablet (1 tablet Oral Given 11/21/22 1543)  iohexol (OMNIPAQUE) 350 MG/ML injection 75 mL (75 mLs Intravenous Contrast Given 11/21/22 1641)                                                                                                                                     Procedures Procedures  (including critical care time)  Medical Decision Making / ED Course   MDM:  77 year old female presenting to the emergency department with chest pain.  Patient well-appearing, physical exam reassuring, she does have some mild right CVA tenderness although seems more focused to the right lateral lower chest wall in this area.  She reports that palpation of this area reproduces her pain.  Suspect most likely cause is  musculoskeletal given age and some CVA tenderness, will check labs including urinalysis.  Pain does radiate to the abdomen as well so we will obtain LFTs and lipase, CT scan to further  evaluate for intra-abdominal process.  Other differential includes cholecystitis, hepatitis, kidney stone.  Less likely pneumonia but given cough although chronic will check chest x-ray.  Will treat her symptoms and reassess.  Clinical Course as of 11/21/22 1829  Tue Nov 21, 2022  1735 Workup was overall reassuring.  CT abdomen pelvis with no focal process.  Chest x-ray with no pneumonia or acute process such as pneumothorax.  Urinalysis not concerning for infectious process and also contaminated.  No sign of hepatobiliary process on CT scan.  Labs with nonspecific mild bilirubin elevation of unclear cause but with reassuring imaging doubt clinically significant.  Patient has a mild leukocytosis, has had leukocytosis on previous lab checks.  BMP with borderline anion gap, but CO2 normal, no severe hyperglycemia, doubt DKA or other acute process.  Patient received IV fluids in the emergency department.  Advise follow-up with primary doctor. Will discharge patient to home. All questions answered. Patient comfortable with plan of discharge. Return precautions discussed with patient and specified on the after visit summary.  [WS]    Clinical Course User Index [WS] Lonell Grandchild, MD     Additional history obtained:  -External records from outside source obtained and reviewed including: Chart review including previous notes, labs, imaging, consultation notes including PMD notes   Lab Tests: -I ordered, reviewed, and interpreted labs.   The pertinent results include:   Labs Reviewed  URINALYSIS, ROUTINE W REFLEX MICROSCOPIC - Abnormal; Notable for the following components:      Result Value   APPearance HAZY (*)    Glucose, UA >=500 (*)    Protein, ur 30 (*)    Bacteria, UA RARE (*)    All other components  within normal limits  BASIC METABOLIC PANEL - Abnormal; Notable for the following components:   Glucose, Bld 121 (*)    Anion gap 17 (*)    All other components within normal limits  CBC - Abnormal; Notable for the following components:   WBC 16.5 (*)    RDW 20.5 (*)    All other components within normal limits  HEPATIC FUNCTION PANEL - Abnormal; Notable for the following components:   Total Bilirubin 1.7 (*)    Bilirubin, Direct 0.4 (*)    Indirect Bilirubin 1.3 (*)    All other components within normal limits  LIPASE, BLOOD    Notable for nonspecific leukocytosis, seen on previous CBC. Nonspecific bilirubin elevation without abnormal galbladder or biliary obstruction seen on CT  EKG   EKG Interpretation Date/Time:  Tuesday November 21 2022 15:42:41 EDT Ventricular Rate:  101 PR Interval:  172 QRS Duration:  89 QT Interval:  350 QTC Calculation: 454 R Axis:   59  Text Interpretation: Sinus tachycardia No significant change since last tracing Confirmed by Alvino Blood ([id number]) on 11/21/2022 3:57:11 PM         Imaging Studies ordered: I ordered imaging studies including CT abd, CXR On my interpretation imaging demonstrates no acute process I independently visualized and interpreted imaging. I agree with the radiologist interpretation   Medicines ordered and prescription drug management: Meds ordered this encounter  Medications   sodium chloride 0.9 % bolus 1,000 mL   oxyCODONE-acetaminophen (PERCOCET/ROXICET) 5-325 MG per tablet 1 tablet   iohexol (OMNIPAQUE) 350 MG/ML injection 75 mL    -I have reviewed the patients home medicines and have made adjustments as needed  Cardiac Monitoring: The patient was maintained on a cardiac monitor.  I personally viewed and interpreted the cardiac monitored  which showed an underlying rhythm of: NSR  Social Determinants of Health:  Diagnosis or treatment significantly limited by social determinants of health:  obesity   Reevaluation: After the interventions noted above, I reevaluated the patient and found that their symptoms have resolved  Co morbidities that complicate the patient evaluation  Past Medical History:  Diagnosis Date   Arthritis    Diabetes mellitus    Hypercholesteremia    Hypertension    Post-menopausal bleeding    Postmenopausal bleeding 08/30/2011   Normal EBX 09/2011.      Dispostion: Disposition decision including need for hospitalization was considered, and patient discharged from emergency department.    Final Clinical Impression(s) / ED Diagnoses Final diagnoses:  Right flank pain     This chart was dictated using voice recognition software.  Despite best efforts to proofread,  errors can occur which can change the documentation meaning.    Lonell Grandchild, MD 11/21/22 [phone]

## 2022-11-21 NOTE — Discharge Instructions (Addendum)
We evaluated you for your flank pain.  Your testing was overall reassuring including chest x-ray and a CT scan of your abdomen.  I think your symptoms are most likely due to rib pain.  Please take 1000 mg of Tylenol every 6 hours as needed for pain.  You can also buy over-the-counter lidocaine patches and apply these to the area of your pain.  Please follow-up closely with your primary doctor.  Please call them for a follow-up visit as soon as possible, ideally within the next 3 days.  You have any new or worsening symptoms such as difficulty breathing, fevers or chills, worsening pain, abdominal pain, nausea or vomiting, diarrhea, painful urination, dizziness or lightheadedness, or any other concerning symptoms, please return to the emergency department.

## 2022-11-21 NOTE — ED Triage Notes (Signed)
Patient reports right flank pain that started last night.  Denies urinary symptoms and reports unsure if pulled muscle or kidney stone.  Reports she knows she has a stone on the left.

## 2022-11-23 ENCOUNTER — Telehealth: Payer: Self-pay

## 2022-11-23 NOTE — Transitions of Care (Post Inpatient/ED Visit) (Signed)
   11/23/2022  Name: Joanna Sandoval MRN: [id number] DOB: [birth year]  Today's TOC FU Call Status: Today's TOC FU Call Status:: Successful TOC FU Call Completed TOC FU Call Complete Date: 11/23/22  Transition Care Management Follow-up Telephone Call Date of Discharge: 11/21/22 Discharge Facility: [facility]) Type of Discharge: Emergency Department Reason for ED Visit: Other: How have you been since you were released from the hospital?: Better Any questions or concerns?: No  Items Reviewed: Did you receive and understand the discharge instructions provided?: Yes Medications obtained,verified, and reconciled?: Yes (Medications Reviewed) Any new allergies since your discharge?: No Dietary orders reviewed?: No  Medications Reviewed Today: Medications Reviewed Today   Medications were not reviewed in this encounter     Home Care and Equipment/Supplies: Were Home Health Services Ordered?: NA Any new equipment or medical supplies ordered?: NA  Functional Questionnaire: Do you need assistance with bathing/showering or dressing?: No Do you need assistance with meal preparation?: No Do you need assistance with eating?: No Do you have difficulty maintaining continence: No Do you need assistance with getting out of bed/getting out of a chair/moving?: No Do you have difficulty managing or taking your medications?: No  Follow up appointments reviewed: PCP Follow-up appointment confirmed?: Yes Date of PCP follow-up appointment?: 11/28/22 Follow-up Provider: [name] [facility] Follow-up appointment confirmed?: No Reason Specialist Follow-Up Not Confirmed: Appointment Sceduled by [facility] Calling Clinician Do you need transportation to your follow-up appointment?: No Do you understand care options if your condition(s) worsen?: Yes-patient verbalized understanding    SIGNATUREYL,RMA

## 2022-11-27 ENCOUNTER — Other Ambulatory Visit: Payer: Self-pay | Admitting: Nurse Practitioner

## 2022-11-27 DIAGNOSIS — I10 Essential (primary) hypertension: Secondary | ICD-10-CM | POA: Diagnosis not present

## 2022-11-27 DIAGNOSIS — E119 Type 2 diabetes mellitus without complications: Secondary | ICD-10-CM | POA: Diagnosis not present

## 2022-11-27 DIAGNOSIS — E782 Mixed hyperlipidemia: Secondary | ICD-10-CM | POA: Diagnosis not present

## 2022-11-28 ENCOUNTER — Telehealth: Payer: Self-pay

## 2022-11-28 ENCOUNTER — Encounter: Payer: Self-pay | Admitting: Nurse Practitioner

## 2022-11-28 ENCOUNTER — Ambulatory Visit (INDEPENDENT_AMBULATORY_CARE_PROVIDER_SITE_OTHER): Payer: Medicare Other | Admitting: Nurse Practitioner

## 2022-11-28 VITALS — BP 112/70 | HR 84 | Temp 98.7°F | Ht 62.0 in | Wt 197.8 lb

## 2022-11-28 DIAGNOSIS — H9192 Unspecified hearing loss, left ear: Secondary | ICD-10-CM

## 2022-11-28 DIAGNOSIS — E1151 Type 2 diabetes mellitus with diabetic peripheral angiopathy without gangrene: Secondary | ICD-10-CM | POA: Diagnosis not present

## 2022-11-28 DIAGNOSIS — R251 Tremor, unspecified: Secondary | ICD-10-CM | POA: Insufficient documentation

## 2022-11-28 DIAGNOSIS — R109 Unspecified abdominal pain: Secondary | ICD-10-CM | POA: Diagnosis not present

## 2022-11-28 DIAGNOSIS — E1139 Type 2 diabetes mellitus with other diabetic ophthalmic complication: Secondary | ICD-10-CM | POA: Diagnosis not present

## 2022-11-28 DIAGNOSIS — H401131 Primary open-angle glaucoma, bilateral, mild stage: Secondary | ICD-10-CM

## 2022-11-28 DIAGNOSIS — E119 Type 2 diabetes mellitus without complications: Secondary | ICD-10-CM | POA: Insufficient documentation

## 2022-11-28 DIAGNOSIS — I739 Peripheral vascular disease, unspecified: Secondary | ICD-10-CM

## 2022-11-28 DIAGNOSIS — Z79899 Other long term (current) drug therapy: Secondary | ICD-10-CM | POA: Diagnosis not present

## 2022-11-28 DIAGNOSIS — Z820 Family history of epilepsy and other diseases of the nervous system: Secondary | ICD-10-CM

## 2022-11-28 DIAGNOSIS — I119 Hypertensive heart disease without heart failure: Secondary | ICD-10-CM | POA: Diagnosis not present

## 2022-11-28 DIAGNOSIS — H6123 Impacted cerumen, bilateral: Secondary | ICD-10-CM | POA: Insufficient documentation

## 2022-11-28 DIAGNOSIS — E559 Vitamin D deficiency, unspecified: Secondary | ICD-10-CM

## 2022-11-28 MED ORDER — ATORVASTATIN CALCIUM 20 MG PO TABS
20.0000 mg | ORAL_TABLET | Freq: Every day | ORAL | 1 refills | Status: DC
Start: 2022-11-28 — End: 2023-01-22

## 2022-11-28 NOTE — Assessment & Plan Note (Signed)
She continues to have tremors both hands affecting her ability to hold objects.  Will check her thyroid functions.  Has a family history of Parkinson's disease will refer to neurology for further evaluation.

## 2022-11-28 NOTE — Progress Notes (Addendum)
[name], CMA,acting as a Neurosurgeon for Joanna Felts, FNP.,have documented all relevant documentation on the behalf of Joanna Felts, FNP,as directed by  Joanna Felts, FNP while in the presence of Joanna Felts, FNP.  Subjective:  Patient ID: Joanna Sandoval , female    DOB: 09/27/1945 , 77 y.o.   MRN: [id number]  Chief Complaint  Patient presents with   ER f/u    HPI  Patient presents today for a ER follow up, patient reports compliance with medications. Patient went to ER on 11/21/2022 for right flank pain, patient reports today she is feeling better. Patient denies any chest pain, SOB, or headache. Patient has no other concerns today. She was given percocet. She has been taking a 1/2 of ibuprofen. Continues to have small amount of discomfort to her right flank area. She used a lidocaine patch. She had a decrease in appetite as well.   Her last day with therapy is tomorrow.   She is concerned about trembling in her hands. She does not have any throw rugs except in her bathroom floor. She was depressed during the time the baby was sick.      Past Medical History:  Diagnosis Date   Arthritis    Diabetes mellitus    Hypercholesteremia    Hypertension    Post-menopausal bleeding    Postmenopausal bleeding 08/30/2011   Normal EBX 09/2011.     Family History  Problem Relation Age of Onset   Hypertension Mother    Hypertension Father    Cancer Father        ?lung   Diabetes Sister      Current Outpatient Medications:    Accu-Chek Softclix Lancets lancets, USE AS DIRECTED, Disp: 200 each, Rfl: 2   atenolol (TENORMIN) 100 MG tablet, Take 50 mg by mouth daily. , Disp: , Rfl:    Blood Glucose Monitoring Suppl (ACCU-CHEK GUIDE) w/Device KIT, USE TO CHECK BLOOD SUGAR  TWICE DAILY AT 10AM AND 5PM, Disp: 1 kit, Rfl: 1   COMIRNATY SUSP injection, , Disp: , Rfl:    D3-50 50000 units capsule, , Disp: , Rfl: 0   diclofenac sodium (VOLTAREN) 1 % GEL, Apply 2 g topically 4 (four) times  daily., Disp: 100 g, Rfl: 1   dorzolamide-timolol (COSOPT) 2-0.5 % ophthalmic solution, 1 drop 2 (two) times daily., Disp: , Rfl:    FARXIGA 10 MG TABS tablet, TAKE 1 TABLET BY MOUTH DAILY  BEFORE BREAKFAST, Disp: 100 tablet, Rfl: 2   glimepiride (AMARYL) 2 MG tablet, TAKE 1 TABLET BY MOUTH IN THE  MORNING AND AT BEDTIME, Disp: 200 tablet, Rfl: 2   glucose blood (ACCU-CHEK GUIDE) test strip, Use as instructed, Disp: 100 each, Rfl: 12   [name] KIT, Use to test blood sugar twice daily as directed. E11.65, Disp: 1 kit, Rfl: 12   latanoprost (XALATAN) 0.005 % ophthalmic solution, 1 drop at bedtime., Disp: , Rfl:    losartan (COZAAR) 50 MG tablet, Take 50 mg by mouth daily., Disp: , Rfl:    magic mouthwash w/lidocaine SOLN, Take 5 mLs by mouth 3 (three) times daily. 1 part each solution, Disp: 120 mL, Rfl: 0   ondansetron (ZOFRAN ODT) 4 MG disintegrating tablet, Take 1 tablet (4 mg total) by mouth every 8 (eight) hours as needed for nausea or vomiting., Disp: 20 tablet, Rfl: 0   RYBELSUS 7 MG TABS, TAKE 1 TABLET BY MOUTH DAILY, Disp: 90 tablet, Rfl: 3   traMADol (ULTRAM)  50 MG tablet, Take 50 mg by mouth every 6 (six) hours as needed., Disp: , Rfl:    albuterol (PROVENTIL HFA;VENTOLIN HFA) 108 (90 Base) MCG/ACT inhaler, Inhale 2 puffs into the lungs every 6 (six) hours as needed. (Patient not taking: Reported on 11/28/2022), Disp: , Rfl:    atorvastatin (LIPITOR) 20 MG tablet, Take 1 tablet (20 mg total) by mouth daily., Disp: 90 tablet, Rfl: 1   No Known Allergies   Review of Systems  Constitutional: Negative.   HENT: Negative.    Eyes: Negative.   Respiratory: Negative.    Cardiovascular: Negative.  Negative for chest pain, palpitations and leg swelling.  Gastrointestinal: Negative.   Genitourinary:  Negative for pelvic pain.  Neurological: Negative.  Negative for dizziness and headaches.  Psychiatric/Behavioral: Negative.       Today's Vitals   11/28/22  0859 11/28/22 1009  BP: (!) 140/78 112/70  Pulse: 84   Temp: 98.7 F (37.1 C)   Weight: 197 lb 12.8 oz (89.7 kg)   Height: 5\' 2"  (1.575 m)   PainSc: 0-No pain    Body mass index is 36.18 kg/m.  Wt Readings from Last 3 Encounters:  11/28/22 197 lb 12.8 oz (89.7 kg)  11/21/22 190 lb (86.2 kg)  09/14/22 190 lb (86.2 kg)    The 10-year ASCVD risk score (Arnett DK, et al., 2019) is: 21.6%   Values used to calculate the score:     Age: 27 years     Sex: Female     Is Non-Hispanic African American: Yes     Diabetic: Yes     Tobacco smoker: No     Systolic Blood Pressure: 112 mmHg     Is BP treated: Yes     HDL Cholesterol: 47 mg/dL     Total Cholesterol: 149 mg/dL  Objective:  Physical Exam Vitals reviewed.  Constitutional:      General: She is not in acute distress.    Appearance: Normal appearance. She is obese.  HENT:     Head: Normocephalic.     Right Ear: External ear normal. There is impacted cerumen.     Left Ear: External ear normal. There is impacted cerumen (removed cerumen and was able to visualize anatomic structures).  Eyes:     Extraocular Movements: Extraocular movements intact.     Conjunctiva/sclera: Conjunctivae normal.     Pupils: Pupils are equal, round, and reactive to light.  Cardiovascular:     Rate and Rhythm: Normal rate and regular rhythm.     Pulses: Normal pulses.     Heart sounds: Normal heart sounds. No murmur heard. Pulmonary:     Effort: Pulmonary effort is normal. No respiratory distress.     Breath sounds: Normal breath sounds. No wheezing.  Chest:     Chest wall: No mass.  Abdominal:     General: Abdomen is flat. Bowel sounds are normal. There is no distension.     Palpations: Abdomen is soft. There is no mass.     Tenderness: There is no abdominal tenderness.  Musculoskeletal:     Comments: Using a cane for support  Skin:    General: Skin is warm and dry.     Capillary Refill: Capillary refill takes less than 2 seconds.   Neurological:     General: No focal deficit present.     Mental Status: She is alert and oriented to person, place, and time.     Cranial Nerves: No cranial nerve deficit.  Motor: No weakness.  Psychiatric:        Mood and Affect: Mood normal.        Behavior: Behavior normal.        Thought Content: Thought content normal.        Judgment: Judgment normal.         Assessment And Plan:  Right flank pain Assessment & Plan: She is doing well since her ER visit.   Hypertensive heart disease without heart failure Assessment & Plan: Blood pressure slightly elevated, repeat has improvement.  Continue current medications.  Orders: -     Basic metabolic panel  Vitamin D deficiency Assessment & Plan: Will check vitamin D level and supplement as needed.    Also encouraged to spend 15 minutes in the sun daily.     Type 2 diabetes mellitus with other ophthalmic complication, without long-term current use of insulin (HCC) -     Basic metabolic panel -     Hemoglobin A1c -     Lipid panel -     Atorvastatin Calcium; Take 1 tablet (20 mg total) by mouth daily.  Dispense: 90 tablet; Refill: 1  Primary open angle glaucoma (POAG) of both eyes, mild stage  PAD (peripheral artery disease) (HCC) Assessment & Plan: Chronic,'s continue statin send refill.  Orders: -     Atorvastatin Calcium; Take 1 tablet (20 mg total) by mouth daily.  Dispense: 90 tablet; Refill: 1  Tremor of both hands Assessment & Plan: She continues to have tremors both hands affecting her ability to hold objects.  Will check her thyroid functions.  Has a family history of Parkinson's disease will refer to neurology for further evaluation.  Orders: -     Ambulatory referral to Neurology  Family history of Parkinson disease  Hearing loss of left ear, unspecified hearing loss type Assessment & Plan: Reports a longstanding history of hearing loss to her left ear.  Removed impacted cerumen.  Gross hearing  intact today.  Tympanometry 40 db both ears.  Will refer to ENT for further evaluation.  Orders: -     Ambulatory referral to Audiology  Bilateral impacted cerumen Assessment & Plan: Able to remove cerumen from left ear completely.  Right ear has deep cerumen impaction but will not remove will allow her to visit ENT first  Orders: -     Ear Lavage  Other long term (current) drug therapy -     TSH    Return if symptoms worsen or fail to improve, for reschedule appt 8/29 to 4 months.  Patient was given opportunity to ask questions. Patient verbalized understanding of the plan and was able to repeat key elements of the plan. All questions were answered to their satisfaction.    Jeanell Sparrow, FNP, have reviewed all documentation for this visit. The documentation on 12/11/22 for the exam, diagnosis, procedures, and orders are all accurate and complete.   IF YOU HAVE BEEN REFERRED TO A SPECIALIST, IT MAY TAKE 1-2 WEEKS TO SCHEDULE/PROCESS THE REFERRAL. IF YOU HAVE NOT HEARD FROM US/SPECIALIST IN TWO WEEKS, PLEASE GIVE [place] A CALL AT [phone].

## 2022-11-28 NOTE — Assessment & Plan Note (Signed)
Blood pressure slightly elevated, repeat has improvement.  Continue current medications.

## 2022-11-28 NOTE — Assessment & Plan Note (Signed)
She is doing well since her ER visit.

## 2022-11-28 NOTE — Patient Instructions (Signed)
Clean ear with 1/2 water and 1/2 peroxide or you can use debrox drops over the counter

## 2022-11-28 NOTE — Assessment & Plan Note (Signed)
Chronic,'s continue statin send refill.

## 2022-11-28 NOTE — Assessment & Plan Note (Signed)
Reports a longstanding history of hearing loss to her left ear.  Removed impacted cerumen.  Gross hearing intact today.  Tympanometry 40 db both ears.  Will refer to ENT for further evaluation.

## 2022-11-28 NOTE — Assessment & Plan Note (Signed)
Will check vitamin D level and supplement as needed.    Also encouraged to spend 15 minutes in the sun daily.   

## 2022-11-28 NOTE — Telephone Encounter (Signed)
Transition Care Management Unsuccessful Follow-up Telephone Call  Date of discharge and from where:  [facility]  Attempts:  1st Attempt  Reason for unsuccessful TCM follow-up call:  No answer/busy   Lenard Forth [facility] Guide, [facility] [phone] [address] [place], [place], [place] [address] Phone: [phone] Email: [email or web address]

## 2022-11-28 NOTE — Assessment & Plan Note (Signed)
Able to remove cerumen from left ear completely.  Right ear has deep cerumen impaction but will not remove will allow her to visit ENT first

## 2022-11-29 ENCOUNTER — Telehealth: Payer: Self-pay

## 2022-11-29 NOTE — Telephone Encounter (Signed)
Transition Care Management Unsuccessful Follow-up Telephone Call  Date of discharge and from where:  [facility]  Attempts:  2nd Attempt  Reason for unsuccessful TCM follow-up call:  No answer/busy   Joanna Sandoval [facility] Guide, [facility] [phone] [address] [place], [place], [place] [address] Phone: [phone] Email: [email or web address]

## 2022-12-05 NOTE — Addendum Note (Signed)
Addended by: Ina Kick on: 12/05/2022 04:33 PM   Modules accepted: Orders

## 2022-12-08 ENCOUNTER — Encounter: Payer: Self-pay | Admitting: Nurse Practitioner

## 2022-12-08 DIAGNOSIS — Z1231 Encounter for screening mammogram for malignant neoplasm of breast: Secondary | ICD-10-CM

## 2022-12-11 DIAGNOSIS — H401131 Primary open-angle glaucoma, bilateral, mild stage: Secondary | ICD-10-CM | POA: Insufficient documentation

## 2022-12-11 NOTE — Addendum Note (Signed)
Addended by: [name] F on: 12/11/2022 04:16 PM   Modules accepted: Orders

## 2022-12-14 ENCOUNTER — Encounter: Payer: Medicare Other | Admitting: Nurse Practitioner

## 2022-12-15 ENCOUNTER — Other Ambulatory Visit: Payer: Self-pay | Admitting: Nurse Practitioner

## 2022-12-15 DIAGNOSIS — E119 Type 2 diabetes mellitus without complications: Secondary | ICD-10-CM

## 2022-12-19 ENCOUNTER — Ambulatory Visit: Payer: Self-pay

## 2022-12-19 NOTE — Patient Outreach (Signed)
Care Coordination   Follow Up Visit Note   12/19/2022 Name: Joanna Sandoval MRN: [id number] DOB: 1945/12/25  Joanna Sandoval is a 77 y.o. year old female who sees [name], FNP for primary care. I spoke with  Lemmie Evens by phone today.  What matters to the patients health and wellness today?  Patient would like to have her tremors evaluated. She will call [facility] to schedule a f/u appointment.     Goals Addressed               This Visit's Progress     Patient Stated     I am having some depression (pt-stated)   On track     Care Coordination Interventions: Evaluation of current treatment plan related to depression and patient's adherence to plan as established by provider Reviewed and discussed next upcoming telephone appointment scheduled with Jenel Lucks LCSW       Other     To follow up with [facility]        Care Coordination Interventions: Evaluation of current treatment plan related to right flank pain  and patient's adherence to plan as established by provider Determined patient experienced a recent ED visit for having right flank pain  Review of patient status, including review of consultant's reports, relevant laboratory and other test results, and medications completed Recommended patient f/u with [facility] for further evaluation Provided patient with the contact number for [facility], she will call for an appointment       To have tremors evaluated   On track     Care Coordination Interventions: Evaluation of current treatment plan related to tremors in hands and knees and patient's adherence to plan as established by provider Reviewed and discussed with patient PCP recommendations for Neurology f/u for evaluation of tremors Determined [facility] missed patient x 2 attempts in order schedule patient's initial visit  Provided patient with the contact number for [facility] and instructed patient to call for an appointment, she recorded  the phone number and verbalizes understanding      To lower A1c <7.0%        Care Coordination Interventions: Provided education to patient about basic DM disease process Review of patient status, including review of consultants reports, relevant laboratory and other test results, and medications completed Counseled on Diabetic diet, my plate method, 409 minutes of moderate intensity exercise/week Determined patient has completed a [employer] application to assist with transportation, once completed, she will consider participating in the PREP program Assessed patient's understanding of A1c goal: <7% Lab Results  Component Value Date   HGBA1C 7.2 (H) 11/28/2022     Interventions Today    Flowsheet Row Most Recent Value  Chronic Disease   Chronic disease during today's visit Diabetes, Other  [tremors,  right flank pain]  General Interventions   General Interventions Discussed/Reviewed General Interventions Discussed, General Interventions Reviewed, Doctor Visits, Labs  Doctor Visits Discussed/Reviewed Doctor Visits Discussed, Doctor Visits Reviewed, PCP, Specialist  Exercise Interventions   Exercise Discussed/Reviewed Exercise Reviewed, Physical Activity, Exercise Discussed  Physical Activity Discussed/Reviewed Physical Activity Reviewed, Physical Activity Discussed  Education Interventions   Education Provided Provided Education  Provided Verbal Education On Nutrition, Labs, Medication, When to see the doctor, Exercise  Labs Reviewed Hgb A1c  Nutrition Interventions   Nutrition Discussed/Reviewed Nutrition Discussed, Fluid intake, Nutrition Reviewed, Portion sizes, Carbohydrate meal planning  Safety Interventions   Safety Discussed/Reviewed Safety Discussed, Safety Reviewed, Fall Risk, Home Safety  Home Safety  Assistive Devices          SDOH assessments and interventions completed:  No     Care Coordination Interventions:  Yes, provided   Follow up plan: Follow up call  scheduled for 01/16/23 @11 :45 AM    Encounter Outcome:  Pt. Visit Completed

## 2022-12-19 NOTE — Patient Instructions (Signed)
Visit Information  Thank you for taking time to visit with me today. Please don't hesitate to contact me if I can be of assistance to you.   Following are the goals we discussed today:   Goals Addressed               This Visit's Progress     Patient Stated     I am having some depression (pt-stated)   On track     Care Coordination Interventions: Evaluation of current treatment plan related to depression and patient's adherence to plan as established by provider Reviewed and discussed next upcoming telephone appointment scheduled with Jenel Lucks LCSW       Other     To follow up with [facility]        Care Coordination Interventions: Evaluation of current treatment plan related to right flank pain  and patient's adherence to plan as established by provider Determined patient experienced a recent ED visit for having right flank pain  Review of patient status, including review of consultant's reports, relevant laboratory and other test results, and medications completed Recommended patient f/u with [facility] for further evaluation Provided patient with the contact number for [facility], she will call for an appointment       To have tremors evaluated   On track     Care Coordination Interventions: Evaluation of current treatment plan related to tremors in hands and knees and patient's adherence to plan as established by provider Reviewed and discussed with patient PCP recommendations for Neurology f/u for evaluation of tremors Determined [facility] missed patient x 2 attempts in order schedule patient's initial visit  Provided patient with the contact number for [facility] and instructed patient to call for an appointment, she recorded the phone number and verbalizes understanding      To lower A1c <7.0%        Care Coordination Interventions: Provided education to patient about basic DM disease process Review of patient status, including review of consultants reports,  relevant laboratory and other test results, and medications completed Counseled on Diabetic diet, my plate method, 086 minutes of moderate intensity exercise/week Determined patient has completed a [employer]AT application to assist with transportation, once completed, she will consider participating in the PREP program Assessed patient's understanding of A1c goal: <7% Lab Results  Component Value Date   HGBA1C 7.2 (H) 11/28/2022         Our next appointment is by telephone on 01/16/23 at 11:45 AM  Please call the care guide team at [phone] if you need to cancel or reschedule your appointment.   If you are experiencing a Mental Health or Behavioral Health Crisis or need someone to talk to, please call 1-800-273-TALK (toll free, 24 hour hotline)  Patient verbalizes understanding of instructions and care plan provided today and agrees to view in MyChart. Active MyChart status and patient understanding of how to access instructions and care plan via MyChart confirmed with patient.     Delsa Sale, RN, BSN, CCM Care Management Coordinator [facility] Care Management  Direct Phone: [phone]

## 2022-12-27 ENCOUNTER — Other Ambulatory Visit: Payer: Self-pay | Admitting: Nurse Practitioner

## 2022-12-27 DIAGNOSIS — E119 Type 2 diabetes mellitus without complications: Secondary | ICD-10-CM

## 2022-12-28 ENCOUNTER — Telehealth: Payer: Self-pay | Admitting: *Deleted

## 2022-12-28 NOTE — Telephone Encounter (Signed)
   Telephone encounter was:  Successful.  12/28/2022 Name: Joanna Sandoval MRN: [id number] DOB: 11/02/1945  Joanna Sandoval is a 77 y.o. year old female who is a primary care patient of [name], FNP . The community resource team was consulted for assistance with Transportation Needs  Patient called this am to schedule [facility] transportation and she told me access [place] application was turned down , lack of supporting evidence that the little lady with a bus stop a half amile away can ride a bus with her little walker , we need to do a new application to see if can provide more documentation about her tremors etc  Mailing new application, scheduled upcoming 9/16 appt with [facility] Care guide performed the following interventions: Patient provided with information about care guide support team and interviewed to confirm resource needs.  Follow Up Plan:  No further follow up planned at this time. The patient has been provided with needed resources. Dione Booze [facility] Health  Population Health Careguide  Direct Dial: [phone] Website: [name]

## 2022-12-29 ENCOUNTER — Telehealth: Payer: Self-pay | Admitting: *Deleted

## 2022-12-29 NOTE — Telephone Encounter (Signed)
Telephone encounter was:  Successful.  12/29/2022 Name: Joanna Sandoval MRN: [id number] DOB: 03-06-1946  Joanna Sandoval is a 77 y.o. year old female who is a primary care patient of [name], FNP . The community resource team was consulted for assistance with Transportation Needs   Care guide performed the following interventions: Patient provided with information about care guide support team and interviewed to confirm resource needs. Patient ride scheduled and LCSW to follow up with new application CG mailed  Follow Up Plan:  No further follow up planned at this time. The patient has been provided with needed resources.  Joanna Sandoval [facility] Health  Population Health Careguide  Direct Dial: [phone] Website: [name]       Joanna Sandoval DOB: 08/24/1945 MRN: [id number]   RIDER WAIVER AND RELEASE OF LIABILITY  For purposes of improving physical access to our facilities, [facility] is pleased to partner with third parties to provide [facility] patients or other authorized individuals the option of convenient, on-demand ground transportation services (the Chiropractor") through use of the technology service that enables users to request on-demand ground transportation from independent third-party providers.  By opting to use and accept these [employer], I, the undersigned, hereby agree on behalf of myself, and on behalf of any minor child using the Science writer for whom I am the parent or legal guardian, as follows:  Science writer provided to me are provided by independent third-party transportation providers who are not [employer] or employees and who are unaffiliated with [employer]. [place] is neither a transportation carrier nor a common or public carrier. [facility] has no control over the quality or safety of the transportation that occurs as a result of the [employer]. [facility] cannot guarantee  that any third-party transportation provider will complete any arranged transportation service. [facility] makes no representation, warranty, or guarantee regarding the reliability, timeliness, quality, safety, suitability, or availability of any of the Transport Services or that they will be error free. I fully understand that traveling by vehicle involves risks and dangers of serious bodily injury, including permanent disability, paralysis, and death. I agree, on behalf of myself and on behalf of any minor child using the Transport Services for whom I am the parent or legal guardian, that the entire risk arising out of my use of the [employer] remains solely with me, to the maximum extent permitted under applicable law. The [employer] are provided "as is" and "as available." [facility] disclaims all representations and warranties, express, implied or statutory, not expressly set out in these terms, including the implied warranties of merchantability and fitness for a particular purpose. I hereby waive and release [facility], its agents, employees, officers, directors, representatives, insurers, attorneys, assigns, successors, subsidiaries, and affiliates from any and all past, present, or future claims, demands, liabilities, actions, causes of action, or suits of any kind directly or indirectly arising from acceptance and use of the [employer]. I further waive and release [facility] and its affiliates from all present and future liability and responsibility for any injury or death to persons or damages to property caused by or related to the use of the [employer]. I have read this Waiver and Release of Liability, and I understand the terms used in it and their legal significance. This Waiver is freely and voluntarily given with the understanding that my right (as well as the right of any minor child for whom I  am the parent or legal guardian using the [employer])  to legal recourse against [facility] in connection with the [employer] is knowingly surrendered in return for use of these services.   I attest that I read the consent document to Lemmie Evens, gave Ms. Ashley Royalty the opportunity to ask questions and answered the questions asked (if any). I affirm that Lemmie Evens then provided consent for she's participation in this program.     Joanna Sandoval

## 2022-12-31 NOTE — Progress Notes (Signed)
Not seen

## 2023-01-01 ENCOUNTER — Telehealth: Payer: Self-pay

## 2023-01-01 DIAGNOSIS — N202 Calculus of kidney with calculus of ureter: Secondary | ICD-10-CM | POA: Diagnosis not present

## 2023-01-01 DIAGNOSIS — R109 Unspecified abdominal pain: Secondary | ICD-10-CM | POA: Diagnosis not present

## 2023-01-01 DIAGNOSIS — R1084 Generalized abdominal pain: Secondary | ICD-10-CM | POA: Diagnosis not present

## 2023-01-01 DIAGNOSIS — N200 Calculus of kidney: Secondary | ICD-10-CM | POA: Diagnosis not present

## 2023-01-01 NOTE — Telephone Encounter (Signed)
Telephone encounter was:  Successful.  01/01/2023 Name: Joanna Sandoval MRN: [id number] DOB: 1945/09/18  Joanna Sandoval is a 77 y.o. year old female who is a primary care patient of [name], FNP . The community resource team was consulted for assistance with Transportation Needs   Care guide performed the following interventions: Spoke with patient to confirm return ride after appointment at [facility]. Spoke with Joanna Sandoval they confirmed receipt of text stating ready. Patient will be picked up by [facility] last 4 of license [id number]. Let patient know details of person picking her up.  Follow Up Plan:  No further follow up planned at this time. The patient has been provided with needed resources.  SIGMilicent Sharol Sandoval [facility]  [facility], [facility] Guide Direct Dial: [phone]  Website: [name]

## 2023-01-03 ENCOUNTER — Ambulatory Visit: Payer: Medicare Other | Admitting: Audiologist

## 2023-01-09 ENCOUNTER — Ambulatory Visit: Payer: Self-pay | Admitting: Licensed Clinical Social Worker

## 2023-01-10 ENCOUNTER — Telehealth: Payer: Self-pay

## 2023-01-10 NOTE — Patient Outreach (Signed)
Care Coordination   Follow Up Visit Note   01/09/2023 Name: Joanna Sandoval MRN: [id number] DOB: March 20, 1946  Joanna Sandoval is a 77 y.o. year old female who sees [name], FNP for primary care. I spoke with  Lemmie Evens by phone today.  What matters to the patients health and wellness today?  Supportive resources and symptom management    Goals Addressed             This Visit's Progress    Obtain Supportive Resources   On track    Activities and task to complete in order to accomplish goals.   Patient will continue to utilize healthy coping skills to assist management of depression and/or anxiety Patient will continue to monitor diet to promote health management [employer] [place] application was denied. Contact [facility] to schedule transportation for medical appts Continue to comply with medications, including eye drops               SDOH assessments and interventions completed:  No     Care Coordination Interventions:  Yes, provided  Interventions Today    Flowsheet Row Most Recent Value  Chronic Disease   Chronic disease during today's visit Diabetes, Hypertension (HTN), Other  [Right flank pain]  General Interventions   General Interventions Discussed/Reviewed General Interventions Reviewed, Doctor Visits  Doctor Visits Discussed/Reviewed Doctor Visits Reviewed  Mental Health Interventions   Mental Health Discussed/Reviewed Mental Health Reviewed, Coping Strategies, Anxiety, Depression  [Agreed to complete new application with [employer] [place]. LCSW allowed pt to process frustration with limited resources for senior citizens. Healthy coping skills discussed. Will re-certify mom's meals in October 2024]  Nutrition Interventions   Nutrition Discussed/Reviewed Nutrition Reviewed  Pharmacy Interventions   Pharmacy Dicussed/Reviewed Pharmacy Topics Reviewed, Medication Adherence  Safety Interventions   Safety Discussed/Reviewed Safety Reviewed       Follow up  plan: Follow up call scheduled for 4-6 weeks    Encounter Outcome:  Patient Visit Completed   Jenel Lucks, MSW, LCSW [facility] Care Management [facility]  [facility] [email or web address] Phone [phone] 5:46 PM

## 2023-01-10 NOTE — Patient Instructions (Signed)
Visit Information  Thank you for taking time to visit with me today. Please don't hesitate to contact me if I can be of assistance to you.   Following are the goals we discussed today:   Goals Addressed             This Visit's Progress    Obtain Supportive Resources   On track    Activities and task to complete in order to accomplish goals.   Patient will continue to utilize healthy coping skills to assist management of depression and/or anxiety Patient will continue to monitor diet to promote health management [employer] [place] application was denied. Contact [facility] to schedule transportation for medical appts Continue to comply with medications, including eye drops               Our next appointment is by telephone on 10/15 at 11 AM  Please call the care guide team at [phone] if you need to cancel or reschedule your appointment.   If you are experiencing a Mental Health or Behavioral Health Crisis or need someone to talk to, please call the Suicide and Crisis Lifeline: 988 call 911   Patient verbalizes understanding of instructions and care plan provided today and agrees to view in MyChart. Active MyChart status and patient understanding of how to access instructions and care plan via MyChart confirmed with patient.     Jenel Lucks, MSW, LCSW [facility] Care Management [place]  [facility] [email or web address] Phone [phone] 5:47 PM

## 2023-01-10 NOTE — Telephone Encounter (Signed)
Telephone encounter was:  Successful.  01/10/2023 Name: [name] MRN: [id number] DOB: Apr 17, 1946  Joanna Sandoval is a 77 y.o. year old female who is a primary care patient of [name], FNP . The community resource team was consulted for assistance with Transportation Needs   Care guide performed the following interventions: Patient provided with information about care guide support team and interviewed to confirm resource needs.Patient requested transportation for and upcoming appontment and I have given her information over the phone for [employer] and [employer] I will be mailing resaources to the patient as well   Follow Up Plan:  No further follow up planned at this time. The patient has been provided with needed resources.    Lenard Forth [place]  [facility], [facility] Guide, Phone: [phone] Website: [name]

## 2023-01-11 ENCOUNTER — Ambulatory Visit: Payer: Self-pay

## 2023-01-11 ENCOUNTER — Ambulatory Visit: Payer: Medicare Other | Admitting: Audiology

## 2023-01-11 NOTE — Patient Instructions (Signed)
Visit Information  Thank you for taking time to visit with me today. Please don't hesitate to contact me if I can be of assistance to you.   Following are the goals we discussed today:  - Arrange transportation via the taxi service of your choice - Contact me if you need assistance in the future with a SCAT application  If you are experiencing a Mental Health or Behavioral Health Crisis or need someone to talk to, please call 1-800-273-TALK (toll free, 24 hour hotline) go to [facility] [address], [place] [phone]) call 911  Patient verbalizes understanding of instructions and care plan provided today and agrees to view in MyChart. Active MyChart status and patient understanding of how to access instructions and care plan via MyChart confirmed with patient.     Bevelyn Ngo, BSW, CDP [facility]  [facility], [facility] Social Worker Direct Dial: [phone]  Fax: [phone]

## 2023-01-11 NOTE — Patient Outreach (Signed)
Care Coordination   Follow Up Visit Note   01/11/2023 Name: Joanna Sandoval MRN: [id number] DOB: 04/18/1945  Joanna Sandoval is a 77 y.o. year old female who sees [name], FNP for primary care. I spoke with  Joanna Sandoval by phone today.  What matters to the patients health and wellness today?  Patient contacted regarding transportation needs to an appointment scheduled on September 30th. Patient reports she has plans to contact her friend who drives for a taxi company.    SDOH assessments and interventions completed:  No     Care Coordination Interventions:  Yes, provided   Interventions Today    Flowsheet Row Most Recent Value  Chronic Disease   Chronic disease during today's visit Other  [Kidney Stone]  General Interventions   General Interventions Discussed/Reviewed General Interventions Discussed, Doctor Visits  [Discussed pt has an appt Monday 9/30 at [facility]. Patient has applied for [place] in the past and was denied. Not interested in applying again. Pt to pay OOP for a taxi service]  Doctor Visits Discussed/Reviewed Doctor Visits Discussed        Follow up plan: No further intervention required.   Encounter Outcome:  Patient Visit Completed   Bevelyn Ngo, BSW, CDP [facility]  [facility], [facility] Social Worker Direct Dial: [phone]  Fax: [phone]

## 2023-01-15 DIAGNOSIS — N202 Calculus of kidney with calculus of ureter: Secondary | ICD-10-CM | POA: Diagnosis not present

## 2023-01-16 ENCOUNTER — Ambulatory Visit: Payer: Self-pay

## 2023-01-16 ENCOUNTER — Telehealth: Payer: Self-pay

## 2023-01-16 NOTE — Progress Notes (Signed)
Care Guide Note  01/16/2023 Name: QUIARA KORZENIEWSKI MRN: [id number] DOB: Apr 06, 1946  Referred by: [name], FNP Reason for referral : Care Coordination (Outreach to schedule with pharm d )   MALANNA SHEELY is a 77 y.o. year old female who is a primary care patient of [name], FNP. Lemmie Evens was referred to the pharmacist for assistance related to DM.    Successful contact was made with the patient to discuss pharmacy services including being ready for the pharmacist to call at least 5 minutes before the scheduled appointment time, to have medication bottles and any blood sugar or blood pressure readings ready for review. The patient agreed to meet with the pharmacist via with the pharmacist via telephone visit on (date/time).  01/22/2023  Penne Lash, RMA Care Guide [facility]  [place], [place] [address] Direct Dial: [phone] [email or web address]

## 2023-01-16 NOTE — Patient Instructions (Signed)
Visit Information  Thank you for taking time to visit with me today. Please don't hesitate to contact me if I can be of assistance to you.   Following are the goals we discussed today:   Goals Addressed               This Visit's Progress     Patient Stated     COMPLETED: I am having some depression (pt-stated)        Care Coordination Interventions: Evaluation of current treatment plan related to depression and patient's adherence to plan as established by provider Determined patient feels her depression has resolved due to her family situation has resolved with her new great-grandson  Reviewed and discussed patient's upcoming scheduled call from Jenel Lucks LCSW on 01/30/23 @11 :00 AM      Other     To follow up with [facility]   On track     Care Coordination Interventions: Evaluation of current treatment plan related to right flank pain  and patient's adherence to plan as established by provider Reviewed and discussed with patient she completed her Urology visit with [facility] as directed  Review of patient status, including review of consultant's reports, relevant laboratory and other test results, and medications completed Determined patient is awaiting for further recommendations from the PA after she collaborates with the MD Discussed with patient she has increased her daily water intake as directed, her urine specimen was clear during her visit  Instructed patient to report new symptoms or concerns to her doctor promptly      To have tremors evaluated   On track     Care Coordination Interventions: Evaluation of current treatment plan related to tremors in hands and knees and patient's adherence to plan as established by provider Reviewed and discussed with patient her upcoming scheduled consultation with Dr. Frances Furbish, Neurologist for evaluation of tremors is scheduled for 03/13/23 @8 :15 AM      To lower A1c <7.0%   On track     Care Coordination  Interventions: Provided education to patient about basic DM disease process Reviewed medications with patient and discussed importance of medication adherence Determined patient is experiencing severe stomach pain after taking her am and pm medications Sent pharmacy referral to Nilda Simmer RPH-CPP requesting outreach to this patient to review her medications and assess for potential SE to her dm meds and make the appropriate recommendations, reply received and Catie will f/u with patient   Assessed patient's understanding of A1c goal: <7% Lab Results  Component Value Date   HGBA1C 7.2 (H) 11/28/2022          Our next appointment is by telephone on 01/30/23 at 11:00 AM   Please call the care guide team at [phone] if you need to cancel or reschedule your appointment.   If you are experiencing a Mental Health or Behavioral Health Crisis or need someone to talk to, please call 1-800-273-TALK (toll free, 24 hour hotline)  Patient verbalizes understanding of instructions and care plan provided today and agrees to view in MyChart. Active MyChart status and patient understanding of how to access instructions and care plan via MyChart confirmed with patient.     Delsa Sale RN BSN CCM [place]  [facility], [facility] Health Nurse Care Coordinator  Direct Dial: [phone] Website: [email or web address]

## 2023-01-16 NOTE — Patient Outreach (Signed)
Care Coordination   Follow Up Visit Note   01/16/2023 Name: Joanna Sandoval MRN: [id number] DOB: [birth year]  Joanna Sandoval is a 77 y.o. year old female who sees [name], FNP for primary care. I spoke with  Lemmie Evens by phone today.  What matters to the patients health and wellness today?  Patient would like to review her medications with a pharmacist to determine if she is having an adverse SE.     Goals Addressed               This Visit's Progress     Patient Stated     COMPLETED: I am having some depression (pt-stated)        Care Coordination Interventions: Evaluation of current treatment plan related to depression and patient's adherence to plan as established by provider Determined patient feels her depression has resolved due to her family situation has resolved with her new great-grandson  Reviewed and discussed patient's upcoming scheduled call from Jenel Lucks LCSW on 01/30/23 @11 :00 AM      Other     To follow up with [facility]   On track     Care Coordination Interventions: Evaluation of current treatment plan related to right flank pain  and patient's adherence to plan as established by provider Reviewed and discussed with patient she completed her Urology visit with [facility] as directed  Review of patient status, including review of consultant's reports, relevant laboratory and other test results, and medications completed Determined patient is awaiting for further recommendations from the PA after she collaborates with the MD Discussed with patient she has increased her daily water intake as directed, her urine specimen was clear during her visit  Instructed patient to report new symptoms or concerns to her doctor promptly      To have tremors evaluated   On track     Care Coordination Interventions: Evaluation of current treatment plan related to tremors in hands and knees and patient's adherence to plan as established by  provider Reviewed and discussed with patient her upcoming scheduled consultation with Dr. Frances Furbish, Neurologist for evaluation of tremors is scheduled for 03/13/23 @8 :15 AM      To lower A1c <7.0%   On track     Care Coordination Interventions: Provided education to patient about basic DM disease process Reviewed medications with patient and discussed importance of medication adherence Determined patient is experiencing severe stomach pain after taking her am and pm medications Sent pharmacy referral to Nilda Simmer RPH-CPP requesting outreach to this patient to review her medications and assess for potential SE to her dm meds and make the appropriate recommendations, reply received and Catie will f/u with patient   Assessed patient's understanding of A1c goal: <7% Lab Results  Component Value Date   HGBA1C 7.2 (H) 11/28/2022         SDOH assessments and interventions completed:  No     Care Coordination Interventions:  Yes, provided   Follow up plan: Referral made to Nilda Simmer RPH-CPP to review medications with patient to assess for potential SE  Follow up call scheduled for 01/30/23 @11 :00 AM    Encounter Outcome:  Patient Visit Completed

## 2023-01-22 ENCOUNTER — Other Ambulatory Visit: Payer: Medicare Other | Admitting: Pharmacist

## 2023-01-22 NOTE — Patient Instructions (Signed)
Ms. Fetherolf,   Try stopping Rybelsus for the next week to see if there is any change in stomach cramping.   Try to think about when the cramping started (was it a few weeks ago, a few months ago?), if anything makes it worse, or if anything makes it better.  Thanks!  Catie Clearance Coots, PharmD

## 2023-01-22 NOTE — Progress Notes (Signed)
01/22/2023 Name: Joanna Sandoval MRN: [id number] DOB: 10-24-1945  Chief Complaint  Patient presents with   Medication Management    Joanna Sandoval is a 77 y.o. year old female who presented for a telephone visit.   They were referred to the pharmacist by their PCP for assistance in managing complex medication management.    Subjective:  Care Team: Primary Care Provider: [name], FNP ; Next Scheduled Visit: 04/04/2023  Medication Access/Adherence  Current Pharmacy:  [facility] [phone] Gin[place] Otto, [place] - [address] AT [facility] [address] [place] [address] Phone: [phone] Fax: [phone]  [facility]) - [place], [place] - [address] [facility] [address] [place] [address] [place] [address] Phone: [phone] Fax: [phone]  [facility] Delivery - [place], [place] - [address] [place] [address] Phone: [phone] Fax: [phone]   Patient reports affordability concerns with their medications: No  Patient reports access/transportation concerns to their pharmacy: No  Patient reports adherence concerns with their medications:  No    Reports today that she has stomach cramping that starts after she takes her AM medications. Through the conversation she says it starts in the morning, but then also says it persists all day, every day. Attributes it to her medications, but cannot specify that it started after any medication changes. Notes it has been several months. Denies having ever talked to her medical team about this problem.   Denies blood in the toilet or dark stools. Denies change in stool, reports periodic diarrhea but attributes this more to what she eats.    Diabetes:   Current medications: Rybelsus 7 mg daily, glimepiride 2 mg twice daily, Farxiga 10 mg daily  Medications tried in the past: reports she had  been on metformin before, made her feel like she was "fading away", or like she was "going to die" or pass out; reports she was on Rybelsus 14 mg daily higher   Appropriately waiting 30 minutes after Rybelsus  Current glucose readings: fastings: 110-140s; will check after a meal if pre-meal was high  Patient reports periodic hypoglycemic s/sx including dizziness, shakiness, sweating.   Current meal patterns:  - Breakfast: oatmeal, toast  - Lunch: hot dogs, french fries - Supper: sometimes eats potatoes, baked chicken; green beans; greens peas, corn; sweet potatoes;  - Snacks: sweets once in a while; half a honey bun; still occasionally having some cake  - Drinks: water; diluted sweet tea;   Hypertension:  Current medications: atenolol 100 mg daily, losartan 50 mg daily  Hyperlipidemia/ASCVD Risk Reduction  Current lipid lowering medications: atorvastatin 40 mg daily    Objective:  Lab Results  Component Value Date   HGBA1C 7.2 (H) 11/28/2022    Lab Results  Component Value Date   CREATININE 0.83 11/28/2022   BUN 13 11/28/2022   NA 144 11/28/2022   K 4.3 11/28/2022   CL 106 11/28/2022   CO2 27 11/28/2022    Lab Results  Component Value Date   CHOL 149 11/28/2022   HDL 47 11/28/2022   LDLCALC 80 11/28/2022   TRIG 124 11/28/2022   CHOLHDL 3.2 11/28/2022    Medications Reviewed Today     Reviewed by Alden Hipp, RPH-CPP (Pharmacist) on 01/22/23 at 1055  Med List Status: <None>   Medication Order Taking? Sig Documenting Provider Last Dose Status Informant  Accu-Chek  Softclix Lancets lancets [id number] Yes USE AS DIRECTED [name], FNP Taking Active   albuterol (PROVENTIL HFA;VENTOLIN HFA) 108 (90 Base) MCG/ACT inhaler [id number] No Inhale 2 puffs into the lungs every 6 (six) hours as needed.  Patient not taking: Reported on 11/28/2022   [provider] Not Taking Active   atenolol (TENORMIN) 100 MG tablet [id number] Yes Take 100 mg by mouth  daily. [provider] Taking Active   atorvastatin (LIPITOR) 20 MG tablet [id number] Yes Take 1 tablet (20 mg total) by mouth daily. [name], FNP Taking Active   Blood Glucose Monitoring Suppl (ACCU-CHEK GUIDE) w/Device KIT [id number] Yes USE TO CHECK BLOOD SUGAR  TWICE DAILY AT 10AM AND 5PM [name], FNP Taking Active   D3-50 50000 units capsule [id number] Yes  [provider] Taking Active   diclofenac sodium (VOLTAREN) 1 % GEL [id number]  Apply 2 g topically 4 (four) times daily. [name], FNP  Active   dorzolamide-timolol (COSOPT) 2-0.5 % ophthalmic solution [id number] Yes 1 drop 2 (two) times daily. [provider] Taking Active   FARXIGA 10 MG TABS tablet [id number] Yes TAKE 1 TABLET BY MOUTH DAILY  BEFORE Golden Hurter, FNP Taking Active   glimepiride (AMARYL) 2 MG tablet [id number] Yes TAKE 1 TABLET BY MOUTH IN THE  MORNING AND AT BEDTIME [name], FNP Taking Active   glucose blood (ACCU-CHEK GUIDE) test strip [id number] Yes Use as instructed [name], FNP Taking Active   [name] KIT [id number] Yes Use to test blood sugar twice daily as directed. E11.65 [name], FNP Taking Active   latanoprost (XALATAN) 0.005 % ophthalmic solution [id number]  1 drop at bedtime. [provider]  Active   losartan (COZAAR) 50 MG tablet [id number] Yes Take 50 mg by mouth daily. [provider] Taking Active   RYBELSUS 7 MG TABS [id number] Yes TAKE 1 TABLET BY MOUTH DAILY [name], FNP Taking Active              Assessment/Plan:   Diabetes: - Currently uncontrolled, more relaxed goal likely appropriate.  - Unclear if side effects truly related to Rybelsus, but patient seems to believe it relates to GI side effects. Hold Rybelsus for 1 week to see if any improvements in stomach cramping. Will discuss with PCP.  - Recommend to continue current regimen. Discussed risk of hypoglycemia with  glimepiride. Will consider opportunities to decrease/eliminate moving forward.   Hypertension: - Currently controlled - Recommend to continue current regimen at this time  Hyperlipidemia/ASCVD Risk Reduction: - Currently controlled given age - Recommend to continue current regimen   Follow Up Plan: phone call in 1 week  Catie TClearance Coots, PharmD, BCACP, CPP Clinical Pharmacist [facility] [phone]

## 2023-01-29 ENCOUNTER — Other Ambulatory Visit: Payer: 59 | Admitting: Pharmacist

## 2023-01-29 DIAGNOSIS — E1139 Type 2 diabetes mellitus with other diabetic ophthalmic complication: Secondary | ICD-10-CM

## 2023-01-29 NOTE — Progress Notes (Signed)
Care Coordination Call  Spoke with patient. She notes that she held Rybelsus since we talked last week. She notes there was some improvement in cramping, but not full resolution. She notes that she is seeing GI tomorrow to discuss the cramping.   Advised her to continue to hold Rybelsus and discuss with GI tomorrow. I will call in 2 weeks to follow up and discuss home blood sugar readings.   Catie Eppie Gibson, PharmD, BCACP, CPP Clinical Pharmacist [facility] [phone]

## 2023-01-30 ENCOUNTER — Encounter: Payer: Self-pay | Admitting: Licensed Clinical Social Worker

## 2023-01-30 DIAGNOSIS — R194 Change in bowel habit: Secondary | ICD-10-CM | POA: Diagnosis not present

## 2023-01-30 DIAGNOSIS — Z1211 Encounter for screening for malignant neoplasm of colon: Secondary | ICD-10-CM | POA: Diagnosis not present

## 2023-01-30 DIAGNOSIS — Z860100 Personal history of colon polyps, unspecified: Secondary | ICD-10-CM | POA: Diagnosis not present

## 2023-01-31 ENCOUNTER — Telehealth: Payer: Self-pay | Admitting: Licensed Clinical Social Worker

## 2023-01-31 NOTE — Patient Outreach (Signed)
Care Coordination   01/30/2023 Name: KLARISA DELPILAR MRN: [id number] DOB: 04-Aug-1945   Care Coordination Outreach Attempts:  An unsuccessful telephone outreach was attempted for a scheduled appointment today.  Follow Up Plan:  Additional outreach attempts will be made to offer the patient care coordination information and services.   Encounter Outcome:  No Answer   Care Coordination Interventions:  No, not indicated    Jenel Lucks, MSW, LCSW [facility] [place]  [facility] [email or web address] Phone [phone] 6:32 PM

## 2023-02-01 ENCOUNTER — Other Ambulatory Visit: Payer: Self-pay | Admitting: Nurse Practitioner

## 2023-02-01 DIAGNOSIS — I739 Peripheral vascular disease, unspecified: Secondary | ICD-10-CM

## 2023-02-01 DIAGNOSIS — E1139 Type 2 diabetes mellitus with other diabetic ophthalmic complication: Secondary | ICD-10-CM

## 2023-02-12 ENCOUNTER — Other Ambulatory Visit (INDEPENDENT_AMBULATORY_CARE_PROVIDER_SITE_OTHER): Payer: 59 | Admitting: Pharmacist

## 2023-02-12 DIAGNOSIS — E1139 Type 2 diabetes mellitus with other diabetic ophthalmic complication: Secondary | ICD-10-CM

## 2023-02-12 MED ORDER — ALBUTEROL SULFATE HFA 108 (90 BASE) MCG/ACT IN AERS: 2.0000 | INHALATION_SPRAY | Freq: Four times a day (QID) | RESPIRATORY_TRACT | 1 refills | Status: DC | PRN

## 2023-02-12 NOTE — Progress Notes (Signed)
02/12/2023 Name: Joanna Sandoval MRN: [id number] DOB: Oct 05, 1945  Chief Complaint  Patient presents with   Medication Management   Diabetes    Joanna Sandoval is a 77 y.o. year old female who presented for a telephone visit.   They were referred to the Joanna by their PCP for assistance in managing diabetes.    Subjective:  Care Team: Primary Care Provider: [name], FNP ; Next Scheduled Visit: 04/04/23  Medication Access/Adherence  Current Pharmacy:  [facility] [phone] [place] Otto, [place] - [address] AT [facility] [address] [place] [address] Phone: [phone] Fax: [phone]  [facility] Delivery) - [place], [place] - [address] [facility] [address] [place] [address] [place] [address] Phone: [phone] Fax: [phone]  [facility] Delivery - [place], [place] - [address] [place] [address] Phone: [phone] Fax: [phone]   Patient reports affordability concerns with their medications: No  Patient reports access/transportation concerns to their pharmacy: No  Patient reports adherence concerns with their medications:  No    Reports she saw Joanna Sandoval. Has a colonoscopy next week.   Diabetes:  Current medications: Farxiga 10 mg daily, glimepiride 2 mg twice daily Medications tried in the past: metformin - felt like she was "fading away"; Rybelsus - stomach cramping - though notes she has cramping after certain meals/foods. Thinks the cramping is a little bit better with stopping the Rybelsus.   Reports she was told by Joanna Sandoval to hold Rybelsus prior to upcoming colonoscopy.   Current glucose readings: reports before and after meals, readings are ~120-140s  Current meal patterns: notes she doesn't eat fatty/fried foods, but does discuss chicken, potatoes, greens, sweets.    Objective:  Lab Results  Component  Value Date   HGBA1C 7.2 (H) 11/28/2022    Lab Results  Component Value Date   CREATININE 0.83 11/28/2022   BUN 13 11/28/2022   NA 144 11/28/2022   K 4.3 11/28/2022   CL 106 11/28/2022   CO2 27 11/28/2022    Lab Results  Component Value Date   CHOL 149 11/28/2022   HDL 47 11/28/2022   LDLCALC 80 11/28/2022   TRIG 124 11/28/2022   CHOLHDL 3.2 11/28/2022    Medications Reviewed Today     Reviewed by Joanna Sandoval, RPH-CPP (Joanna) on 02/12/23 at 1411  Med List Status: <None>   Medication Order Taking? Sig Documenting Provider Last Dose Status Informant  Accu-Chek Softclix Lancets lancets [id number] No USE AS DIRECTED Joanna Felts, FNP Taking Active   albuterol (VENTOLIN HFA) 108 (90 Base) MCG/ACT inhaler [id number]  Inhale 2 puffs into the lungs every 6 (six) hours as needed. Joanna Felts, FNP  Active   atenolol (TENORMIN) 100 MG tablet [id number] No Take 100 mg by mouth daily. [provider] Taking Active   atorvastatin (LIPITOR) 40 MG tablet [id number] No Take 40 mg by mouth daily. [provider] Taking Active   Blood Glucose Monitoring Suppl (ACCU-CHEK GUIDE) w/Device KIT [id number] No USE TO CHECK BLOOD SUGAR  TWICE DAILY AT 10AM AND 5PM Joanna Felts, FNP Taking Active   D3-50 50000 units capsule [id number] No  [provider] Taking Active   diclofenac sodium (VOLTAREN) 1 % GEL [id number] No Apply 2 g topically 4 (four) times daily. Joanna Felts, FNP Taking Active   dorzolamide-timolol (COSOPT) 2-0.5 % ophthalmic solution [id number] No  1 drop 2 (two) times daily. [provider] Taking Active   FARXIGA 10 MG TABS tablet [id number] No TAKE 1 TABLET BY MOUTH DAILY  BEFORE Joanna Hurter, FNP Taking Active   glimepiride (AMARYL) 2 MG tablet [id number] No TAKE 1 TABLET BY MOUTH IN THE  MORNING AND AT BEDTIME Joanna Felts, FNP Taking Active   glucose blood (ACCU-CHEK GUIDE) test strip [id number] No Use as instructed Joanna Felts,  FNP Taking Active   [name] KIT [id number] No Use to test blood sugar twice daily as directed. E11.65 Joanna Felts, FNP Taking Active   latanoprost (XALATAN) 0.005 % ophthalmic solution [id number] No 1 drop at bedtime. [provider] Taking Active   losartan (COZAAR) 50 MG tablet [id number] No Take 50 mg by mouth daily. [provider] Taking Active   RYBELSUS 7 MG TABS [id number] No TAKE 1 TABLET BY MOUTH DAILY Joanna Felts, FNP Taking Active               Assessment/Plan:   Diabetes: - Currently uncontrolled. Suspect that reporting cramping is more related to dietary choices, as it has persisted without being on Rybelsus. Patient is focused on completing colonoscopy first, will circle back to discussing diet, medication needs for blood sugar control after colonoscopy. Could consider [name] moving forward if needed, through would prefer to retry Rybelsus if able, as more likely to provide blood sugar lowering needed to allow for reducing dose of glimepiride.  - Recommend to continue [place]. Advised to hold glimepiride dose the morning of colonoscopy.  - Recommend to check glucose twice daily, fasting and 2 hour post prandial.   Follow Up Plan: phone call in 6 weeks  Joanna Sandoval, PharmD, BCACP, CPP Clinical Joanna Sandoval [phone]

## 2023-02-13 ENCOUNTER — Ambulatory Visit: Payer: 59 | Admitting: Audiologist

## 2023-02-19 ENCOUNTER — Other Ambulatory Visit: Payer: Self-pay

## 2023-02-21 DIAGNOSIS — K635 Polyp of colon: Secondary | ICD-10-CM | POA: Diagnosis not present

## 2023-02-21 DIAGNOSIS — Z860100 Personal history of colon polyps, unspecified: Secondary | ICD-10-CM | POA: Diagnosis not present

## 2023-02-21 DIAGNOSIS — D123 Benign neoplasm of transverse colon: Secondary | ICD-10-CM | POA: Diagnosis not present

## 2023-02-21 DIAGNOSIS — Z860101 Personal history of adenomatous and serrated colon polyps: Secondary | ICD-10-CM | POA: Diagnosis not present

## 2023-02-21 DIAGNOSIS — K6389 Other specified diseases of intestine: Secondary | ICD-10-CM | POA: Diagnosis not present

## 2023-02-21 DIAGNOSIS — Z860102 Personal history of hyperplastic colon polyps: Secondary | ICD-10-CM | POA: Diagnosis not present

## 2023-02-21 LAB — HM COLONOSCOPY

## 2023-02-22 ENCOUNTER — Other Ambulatory Visit: Payer: Self-pay | Admitting: Nurse Practitioner

## 2023-02-26 DIAGNOSIS — I10 Essential (primary) hypertension: Secondary | ICD-10-CM | POA: Diagnosis not present

## 2023-02-26 DIAGNOSIS — M150 Primary generalized (osteo)arthritis: Secondary | ICD-10-CM | POA: Diagnosis not present

## 2023-02-26 DIAGNOSIS — E782 Mixed hyperlipidemia: Secondary | ICD-10-CM | POA: Diagnosis not present

## 2023-02-26 DIAGNOSIS — E119 Type 2 diabetes mellitus without complications: Secondary | ICD-10-CM | POA: Diagnosis not present

## 2023-03-01 ENCOUNTER — Other Ambulatory Visit: Payer: Self-pay

## 2023-03-12 ENCOUNTER — Ambulatory Visit: Payer: 59 | Attending: Nurse Practitioner | Admitting: Audiologist

## 2023-03-12 DIAGNOSIS — H903 Sensorineural hearing loss, bilateral: Secondary | ICD-10-CM | POA: Diagnosis not present

## 2023-03-12 NOTE — Procedures (Signed)
  Outpatient Audiology and [facility] [address] [place], [place]  [address] [phone]  AUDIOLOGICAL  EVALUATION  NAME: Joanna Sandoval     DOB:   16-Jul-1945      MRN: [id number]                                                                                     DATE: 03/12/2023     REFERENT: [name], FNP STATUS: Outpatient DIAGNOSIS: Sensorineural hearing loss bilateral  History: [name] was seen for an audiological evaluation due to difficulty hearing her family. [name] is asking people to repeat themselves more often.  Her family is getting annoyed with her.  She is having trouble hearing over the phone. [name] denies pain, pressure, or tinnitus for both ears.  She feels that her right ear hearing is better than her left.  [name] no history of hazardous noise exposure. Medical history shows history of impacted cerumen.    Evaluation:  Otoscopy showed a clear view of the tympanic membranes, bilaterally Tympanometry results were consistent with normal middle ear function bilaterally Audiometric testing was completed using Conventional Audiometry techniques with insert earphones and supraural headphones. Test results are consistent with mild sloping to severe sensorineural hearing loss bilateral. Speech Recognition Thresholds were obtained at 30 dB HL in the right ear and at 30 dB HL in the left ear. Word Recognition Testing was completed at  40dB SL and Donata scored 100% in the right ear and 92% in the left ear.  Results:  The test results were reviewed with [name].  She has a mild sloping to severe sensorineural hearing loss in both ears.  Hearing is symmetric.  She needs hearing aids for both years, these will help her understand in her difficult listening situations.  She is ready to try hearing aids.  She was given a list of local providers and a handout on how to utilize her [place] healthcare benefits. Audiogram printed and provided to [name].     Recommendations: Hearing aids recommended for both ears. Patient given list of local hearing aid providers.  Annual audiometric testing recommended to monitor hearing loss for progression.     32 minutes spent testing and counseling on results.   If you have any questions please feel free to contact me at [phone].  Ammie Ferrier Au.D.  Audiologist   03/12/2023  3:48 PM  Cc: [name], FNP

## 2023-03-13 ENCOUNTER — Ambulatory Visit (INDEPENDENT_AMBULATORY_CARE_PROVIDER_SITE_OTHER): Payer: 59 | Admitting: Neurology

## 2023-03-13 ENCOUNTER — Encounter: Payer: Self-pay | Admitting: Neurology

## 2023-03-13 VITALS — BP 130/77 | HR 68 | Ht 62.0 in | Wt 199.0 lb

## 2023-03-13 DIAGNOSIS — R0681 Apnea, not elsewhere classified: Secondary | ICD-10-CM

## 2023-03-13 DIAGNOSIS — E669 Obesity, unspecified: Secondary | ICD-10-CM

## 2023-03-13 DIAGNOSIS — Z820 Family history of epilepsy and other diseases of the nervous system: Secondary | ICD-10-CM

## 2023-03-13 DIAGNOSIS — Z9189 Other specified personal risk factors, not elsewhere classified: Secondary | ICD-10-CM | POA: Diagnosis not present

## 2023-03-13 DIAGNOSIS — R519 Headache, unspecified: Secondary | ICD-10-CM

## 2023-03-13 DIAGNOSIS — G250 Essential tremor: Secondary | ICD-10-CM

## 2023-03-13 DIAGNOSIS — R351 Nocturia: Secondary | ICD-10-CM

## 2023-03-13 DIAGNOSIS — Z8249 Family history of ischemic heart disease and other diseases of the circulatory system: Secondary | ICD-10-CM

## 2023-03-13 NOTE — Patient Instructions (Addendum)
You have a mild tremor of both hands, and a slight, intermittent head tremor. 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  I do not see any signs or symptoms of parkinson's like disease or what we call parkinsonism.  For your tremor, I would not recommend any new medications at this time. You are already on a beta blocker. I would be reluctant to start you on a medication called primidone, due you concern for balance issues and increase in fall risk. Please look into using your call alert button, such as the Life Alert.  Please remember, that any kind of tremor may be exacerbated by anxiety, anger, nervousness, excitement, dehydration, sleep deprivation, thyroid dysfunction, by caffeine, and low blood sugar values or blood sugar fluctuations. Some medications can exacerbate tremors, this includes certain asthma or COPD medications and certain antidepressants.  We will do a brain scan, called MRI and call you with the test results. We will have to schedule you for this on a separate date. This test requires authorization from your insurance, and we will take care of the insurance process. Based on your symptoms and your exam I believe you are at risk for obstructive sleep apnea (aka OSA). We should proceed with a sleep study to determine whether you do or do not have OSA and how severe it is. Even, if you have mild OSA, I may want you to consider treatment with CPAP or an autoPAP machine, as treatment of even borderline or mild sleep apnea can result and improvement of symptoms such as sleep disruption, daytime sleepiness, nighttime bathroom breaks, restless leg symptoms, improvement of headache syndromes, even improved mood disorder. Better sleep may result in reduced tremors.  Our sleep lab administrative assistant will call you to schedule your sleep study and give you further instructions, regarding the check in process for the sleep study, arrival time, what to bring, when you can expect to  leave after the study, etc., and to answer any other logistical questions you may have. If you don't hear back from her by about 2 weeks from now, please feel free to call her direct line at [phone] or you can call our general clinic number, or email [place] through My Chart.

## 2023-03-13 NOTE — Progress Notes (Signed)
Subjective:    Patient ID: Joanna Sandoval is a 77 y.o. female.  HPI    Huston Foley, MD, PhD [facility] [address], [address] [address] [place], [place] [address]  Dear Lolita Cram,   I saw your patient, Joanna Sandoval, upon your kind request in my neurologic clinic today for initial consultation of her tremor.  The patient is unaccompanied today.  As you know, Joanna Sandoval is a 77 year old female with an underlying medical history of diabetes, hypertension, hyperlipidemia, arthritis, vitamin D deficiency, artery disease, and obesity, who reports an approximately 2 year history of hand tremors.  Tremors vary in intensity and are mostly noticeable when she tries to write or hold something with both hands.  She has not fallen thankfully but is worried about falls.  She walks with a cane or walker.  She has knee arthritis bilaterally and previously received steroid shots.  She has not had any surgeries to her knees and would like to avoid any surgeries.  She is divorced, she lives alone, she has a Pharmacist, community apartment and no longer drives.  She reports a family history of tremor, sister, age 18 has hand tremors but they are milder than hers.  Her father lived to be 65 and had Parkinson's disease.  Her mom lived to be 60 and died from a brain hemorrhage/brain aneurysm rupture.  She has 1 son living.  He is 73 years old and is a Naval architect.  She lost 1 son at age 19 from pneumonia.  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  She does not sleep well at all, she reports sleep disruption, has nocturia at least 2-3 times per average night and has had occasional morning headaches.  She snores loudly and has been told that she has breathing pauses while asleep.  When she falls asleep in her recliner, she has woken up with a sense of gasping for air and startle.  She has never had a sleep study but it was  recommended in the past as I understand.  Her Epworth sleepiness score is 12 out of 24, fatigue severity score is 53 out of 63.   I reviewed your office note from 11/28/2022.  She had blood work through your office at the time including BMP, lipid panel, A1c and TSH.  I reviewed blood test results in her electronic chart, A1c was elevated at 7.2, otherwise test results were benign.  She had a head CT with and without contrast in 2001 but results were not available for my review.  Her Past Medical History Is Significant For: Past Medical History:  Diagnosis Date   Arthritis    Diabetes mellitus    Hypercholesteremia    Hypertension    Post-menopausal bleeding    Postmenopausal bleeding 08/30/2011   Normal EBX 09/2011.    Her Past Surgical History Is Significant For: Past Surgical History:  Procedure Laterality Date   CATARACT EXTRACTION     COLONOSCOPY     DILATION AND CURETTAGE OF UTERUS     HYSTEROSCOPY WITH D & C N/A 05/02/2017   Procedure: DILATATION AND CURETTAGE /HYSTEROSCOPY;  Surgeon: Catalina Antigua, MD;  Location: [facility];  Service: Gynecology;  Laterality: N/A;   KNEE ARTHROSCOPY      Her Family History Is Significant For: Family History  Problem Relation Age of Onset   Hypertension Mother    Parkinsonism  Father    Hypertension Father    Cancer Father        ?lung   Diabetes Sister     Her Social History Is Significant For: Social History   Socioeconomic History   Marital status: Divorced    Spouse name: Not on file   Number of children: Not on file   Years of education: Not on file   Highest education level: Not on file  Occupational History   Occupation: retired Lawyer  Tobacco Use   Smoking status: Former    Types: Cigarettes   Smokeless tobacco: Never  Vaping Use   Vaping status: Never Used  Substance and Sexual Activity   Alcohol use: No   Drug use: No   Sexual activity: Not Currently    Birth control/protection: Post-menopausal   Other Topics Concern   Not on file  Social History Narrative   Not on file   Social Determinants of Health   Financial Resource Strain: Medium Risk (09/14/2022)   Overall Financial Resource Strain (CARDIA)    Difficulty of Paying Living Expenses: Somewhat hard  Food Insecurity: No Food Insecurity (09/14/2022)   Hunger Vital Sign    Worried About Running Out of Food in the Last Year: Never true    Ran Out of Food in the Last Year: Never true  Transportation Needs: No Transportation Needs (09/14/2022)   PRAPARE - Administrator, Civil Service (Medical): No    Lack of Transportation (Non-Medical): No  Physical Activity: Inactive (09/14/2022)   Exercise Vital Sign    Days of Exercise per Week: 0 days    Minutes of Exercise per Session: 0 min  Stress: No Stress Concern Present (09/14/2022)   [employer] of Occupational Health - Occupational Stress Questionnaire    Feeling of Stress : Only a little  Social Connections: Moderately Isolated (02/16/2021)   Social Connection and Isolation Panel [NHANES]    Frequency of Communication with Friends and Family: More than three times a week    Frequency of Social Gatherings with Friends and Family: Twice a week    Attends Religious Services: Never    Database administrator or Organizations: Yes    Attends Banker Meetings: Never    Marital Status: Divorced    Her Allergies Are:  No Known Allergies:   Her Current Medications Are:  Outpatient Encounter Medications as of 03/13/2023  Medication Sig   Accu-Chek Softclix Lancets lancets USE AS DIRECTED   albuterol (VENTOLIN HFA) 108 (90 Base) MCG/ACT inhaler Inhale 2 puffs into the lungs every 6 (six) hours as needed.   atenolol (TENORMIN) 100 MG tablet Take 100 mg by mouth daily.   atorvastatin (LIPITOR) 40 MG tablet Take 40 mg by mouth daily.   Blood Glucose Monitoring Suppl (ACCU-CHEK GUIDE) w/Device KIT USE TO CHECK BLOOD SUGAR TWICE  DAILY AT 10AM AND 5 PM    D3-50 50000 units capsule    dorzolamide-timolol (COSOPT) 2-0.5 % ophthalmic solution 1 drop 2 (two) times daily.   FARXIGA 10 MG TABS tablet TAKE 1 TABLET BY MOUTH DAILY  BEFORE BREAKFAST   glimepiride (AMARYL) 2 MG tablet TAKE 1 TABLET BY MOUTH IN THE  MORNING AND AT BEDTIME   glucose blood (ACCU-CHEK GUIDE) test strip USE AS DIRECTED   [name] KIT Use to test blood sugar twice daily as directed. E11.65   latanoprost (XALATAN) 0.005 % ophthalmic solution 1 drop at bedtime.   losartan (COZAAR) 50 MG  tablet Take 50 mg by mouth daily.   diclofenac sodium (VOLTAREN) 1 % GEL Apply 2 g topically 4 (four) times daily. (Patient not taking: Reported on 03/13/2023)   RYBELSUS 7 MG TABS TAKE 1 TABLET BY MOUTH DAILY (Patient not taking: Reported on 03/13/2023)   No facility-administered encounter medications on file as of 03/13/2023.  :   Review of Systems:  Out of a complete 14 point review of systems, all are reviewed and negative with the exception of these symptoms as listed below:  Review of Systems  Neurological:        Pt is here for tremors. Tremors on hands/legs. States uses a walker/cane to ambulate.     Objective:  Neurological Exam  Physical Exam Physical Examination:   Vitals:   03/13/23 0754  BP: 130/77  Pulse: 68    General Examination: The patient is a very pleasant 77 y.o. female in no acute distress. She appears well-nourished and well groomed.  Mildly anxious appearing.  HEENT: Normocephalic, atraumatic, pupils are equal, round and reactive to light, extraocular tracking is good without limitation to gaze excursion or nystagmus noted. Hearing is grossly intact. Face is symmetric with normal facial animation. Speech is clear with no dysarthria noted. There is no hypophonia. There is no lip, neck/head, jaw or voice tremor. Neck is supple with full range of passive and active motion. There are no carotid bruits on auscultation. Oropharynx  exam reveals: mild mouth dryness, adequate dental hygiene and marked airway crowding, due to small airway entry and redundant soft palate, Mallampati class IV.  Tonsils and tip of uvula not fully visualized, uvula wider.  Chest: Clear to auscultation without wheezing, rhonchi or crackles noted.  Heart: S1+S2+0, regular and normal without murmurs, rubs or gallops noted.   Abdomen: Soft, non-tender and non-distended.  Extremities: There is nonpitting puffiness in both distal lower extremities, particularly around the ankles.   Skin: Warm and dry without trophic changes noted.   Musculoskeletal: exam reveals decreased range of motion both knees, reports discomfort in both knees.     Neurologically:  Mental status: The patient is awake, alert and oriented in all 4 spheres. Her immediate and remote memory, attention, language skills and fund of knowledge are appropriate. There is no evidence of aphasia, agnosia, apraxia or anomia. Speech is clear with normal prosody and enunciation. Thought process is linear. Mood is normal and affect is normal.  Cranial nerves II - XII are as described above under HEENT exam.  Motor exam: Normal bulk, strength and tone is noted. There is no obvious resting tremor.  She has a mild bilateral upper extremity postural tremor, slight action tremor bilaterally, no intention tremor. On Archimedes spiral drawing she has mild trembling with the right hand, insecurity but no significant trembling with the left hand, handwriting is legible, slightly tremulous, small.  Fine motor skills and coordination: grossly intact.   Cerebellar testing: No dysmetria or intention tremor. There is no truncal or gait ataxia.   Sensory exam: intact to light touch in the upper and lower extremities.  Reflexes are 1+ in the upper extremities and diminished in the lower extremities. Gait, station and balance: She stands slowly and pushes herself up, she does not need any assistance.  Posture  is age-appropriate to mildly stooped in the upper back, no evidence of scoliosis.  She walks without her single-point cane, no evidence of shuffling, has preserved arm swing but walks slowly and cautiously, does not push knees completely straight.   Assessment  and Plan:  In summary, Joanna Sandoval is a very pleasant 77 y.o.-year old female with an underlying medical history of diabetes, hypertension, hyperlipidemia, arthritis, vitamin D deficiency, artery disease, and obesity, who presents for evaluation of her tremor disorder of approximately 2 years duration.  History and examination are not telltale for Parkinson's disease or parkinsonism.  She was reassured in this regard.  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  She reports that her sister has a tremor as well.  Reportedly, her father had Parkinson's disease.   I talked to the patient at length today.  This was a lengthy appointment of over 60 minutes as we addressed multiple issues and there was extended counseling and coordination of care involved.   Below is a copy of my recommendations and our discussion points from today's visit.  She was given these instructions verbally and also in her MyChart after visit summary which she confirmed she could access electronically.  << You have a mild tremor of both hands, and a slight, intermittent head tremor. 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  I do not see any signs or symptoms of parkinson's like disease or what we call parkinsonism.  For your tremor, I would not recommend any new medications at this time. You are already on a beta blocker. I would be reluctant to start you on a medication called primidone, due you concern for balance issues and increase in fall risk. Please look into using your call alert button, such as the Life Alert.  Please remember, that any kind of tremor may  be exacerbated by anxiety, anger, nervousness, excitement, dehydration, sleep deprivation, thyroid dysfunction, by caffeine, and low blood sugar values or blood sugar fluctuations. Some medications can exacerbate tremors, this includes certain asthma or COPD medications and certain antidepressants.  We will do a brain scan, called MRI and call you with the test results. We will have to schedule you for this on a separate date. This test requires authorization from your insurance, and we will take care of the insurance process. Based on your symptoms and your exam I believe you are at risk for obstructive sleep apnea (aka OSA). We should proceed with a sleep study to determine whether you do or do not have OSA and how severe it is. Even, if you have mild OSA, I may want you to consider treatment with CPAP or an autoPAP machine, as treatment of even borderline or mild sleep apnea can result and improvement of symptoms such as sleep disruption, daytime sleepiness, nighttime bathroom breaks, restless leg symptoms, improvement of headache syndromes, even improved mood disorder. Better sleep may result in reduced tremors.  Our sleep lab administrative assistant will call you to schedule your sleep study and give you further instructions, regarding the check in process for the sleep study, arrival time, what to bring, when you can expect to leave after the study, etc., and to answer any other logistical questions you may have. If you don't hear back from her by about 2 weeks from now, please feel free to call her direct line at [phone] or you can call our general clinic number, or email [place] through My Chart.  >>  Thank you very much for allowing me to participate in the care of this nice patient. If I can be of any further assistance to you please do not hesitate to  call me at [phone].  Sincerely,   Huston Foley, MD, PhD

## 2023-03-14 ENCOUNTER — Telehealth: Payer: Self-pay | Admitting: Neurology

## 2023-03-14 NOTE — Telephone Encounter (Signed)
[employer] medicare NPR sent to GI [phone]

## 2023-03-19 ENCOUNTER — Other Ambulatory Visit (INDEPENDENT_AMBULATORY_CARE_PROVIDER_SITE_OTHER): Payer: 59 | Admitting: Pharmacist

## 2023-03-19 ENCOUNTER — Other Ambulatory Visit: Payer: Self-pay

## 2023-03-19 DIAGNOSIS — E1139 Type 2 diabetes mellitus with other diabetic ophthalmic complication: Secondary | ICD-10-CM

## 2023-03-19 NOTE — Progress Notes (Signed)
03/19/2023 Name: Joanna Sandoval MRN: [id number] DOB: 12/06/1945  Chief Complaint  Patient presents with   Medication Management   Diabetes   Hypertension    Joanna Sandoval is a 77 y.o. year old female who presented for a telephone visit.   They were referred to the pharmacist by their PCP for assistance in managing diabetes, hypertension, and hyperlipidemia.    Subjective:  Care Team: Primary Care Provider: [name], FNP ; Next Scheduled Visit: 04/04/23  Medication Access/Adherence  Current Pharmacy:  [facility] [phone] Ginette Otto, [place] - [address] AT [facility] [address] [place] [address] Phone: [phone] Fax: [phone]  [facility]) - [place], [place] - [address] [facility] [address] [place] [address] [place] [address] Phone: [phone] Fax: [phone]  [facility] - [place], [place] - [address] [place] [address] Phone: [phone] Fax: [phone]   Patient reports affordability concerns with their medications: No  Patient reports access/transportation concerns to their pharmacy: No  Patient reports adherence concerns with their medications:  No    Diabetes:  Current medications: glimepiride 2 mg twice daily, Farxiga 10 mg daily Previous medications: metformin - felt like she was "fading away"; Rybelsus - stomach cramping   Current glucose readings: fastings: 130-150s;   Patient denies hypoglycemic s/sx including dizziness, shakiness, sweating. Current meal patterns:  - Breakfast: toast, oatmeal - Lunch: hamburger, fries; - Drinks: water, sprite; water w/ mint in it   Reports severe stomach cramping has completely gone away.   Medication access: Medicare + Medicaid, no cost concerns.   Hypertension:  Current medications: atenolol 100 mg daily, losartan 50 mg daily   Patient does  not have a validated, automated, upper arm home BP cuff. Reports she is worried about her blood pressure. Last noted office reading was normal.   Hyperlipidemia/ASCVD Risk Reduction  Current lipid lowering medications: atorvastatin 40 mg daily   Objective:  Lab Results  Component Value Date   HGBA1C 7.2 (H) 11/28/2022    Lab Results  Component Value Date   CREATININE 0.83 11/28/2022   BUN 13 11/28/2022   NA 144 11/28/2022   K 4.3 11/28/2022   CL 106 11/28/2022   CO2 27 11/28/2022    Lab Results  Component Value Date   CHOL 149 11/28/2022   HDL 47 11/28/2022   LDLCALC 80 11/28/2022   TRIG 124 11/28/2022   CHOLHDL 3.2 11/28/2022    Medications Reviewed Today     Reviewed by Alden Hipp, RPH-CPP (Pharmacist) on 03/19/23 at 1130  Med List Status: <None>   Medication Order Taking? Sig Documenting Provider Last Dose Status Informant  Accu-Chek Softclix Lancets lancets [id number]  USE AS DIRECTED [name], FNP  Active   albuterol (VENTOLIN HFA) 108 (90 Base) MCG/ACT inhaler [id number]  Inhale 2 puffs into the lungs every 6 (six) hours as needed. [name], FNP  Active   atenolol (TENORMIN) 100 MG tablet [id number] Yes Take 100 mg by mouth daily. [provider] Taking Active   atorvastatin (LIPITOR) 40 MG tablet [id number] Yes Take 40 mg by mouth daily. [provider] Taking Active   Blood Glucose Monitoring Suppl (ACCU-CHEK GUIDE) w/Device KIT [id number]  USE TO CHECK BLOOD SUGAR TWICE  DAILY AT 10AM AND 5 PM [name], FNP  Active   D3-50 50000 units capsule [id number] Yes Take  50,000 Units by mouth once a week. [provider] Taking Active   diclofenac sodium (VOLTAREN) 1 % GEL [id number]  Apply 2 g topically 4 (four) times daily.  Patient not taking: Reported on 03/13/2023   [name], FNP  Active   dorzolamide-timolol (COSOPT) 2-0.5 % ophthalmic solution [id number]  1 drop 2 (two) times daily. [provider]  Active    FARXIGA 10 MG TABS tablet [id number] Yes TAKE 1 TABLET BY MOUTH DAILY  BEFORE Golden Hurter, FNP Taking Active   glimepiride (AMARYL) 2 MG tablet [id number] Yes TAKE 1 TABLET BY MOUTH IN THE  MORNING AND AT BEDTIME [name], FNP Taking Active   glucose blood (ACCU-CHEK GUIDE) test strip [id number] Yes USE AS DIRECTED [name], FNP Taking Active   [name] KIT [id number]  Use to test blood sugar twice daily as directed. E11.65 [name], FNP  Active   latanoprost (XALATAN) 0.005 % ophthalmic solution [id number]  1 drop at bedtime. [provider]  Active   losartan (COZAAR) 50 MG tablet [id number] Yes Take 50 mg by mouth daily. [provider] Taking Active               Assessment/Plan:   Diabetes: - Currently uncontrolled per home readings. Could consider addition of DPP4, but will hold off until upcoming A1c. Goal A1c <8% - Reviewed long term cardiovascular and renal outcomes of uncontrolled blood sugar - Reviewed goal A1c, goal fasting, and goal 2 hour post prandial glucose - Recommend to continue current regimen at this time - Recommend to check glucose fasting and 2 hour post prandial readings.  Hypertension: - Currently controlled per last office visit with neurology.  - Recommend to continue current regimen. Will follow BP at upcoming PCP visit. Discussed home blood pressure cuff, but do worry that patient would check often at home and worry herself about her blood pressure.   Hyperlipidemia/ASCVD Risk Reduction: - Currently uncontrolled, goal LDL <70 - Reviewed long term complications of uncontrolled cholesterol - Recommend to continue current regimen and focus on adherence. If LDL remains >70, recommend increasing atorvastatin dose.     Follow Up Plan: follow up with PCP in 2 weeks as scheduled  Catie Eppie Gibson, PharmD, BCACP, CPP Clinical Pharmacist [facility] [phone]

## 2023-03-22 DIAGNOSIS — H02831 Dermatochalasis of right upper eyelid: Secondary | ICD-10-CM | POA: Diagnosis not present

## 2023-03-22 DIAGNOSIS — H16223 Keratoconjunctivitis sicca, not specified as Sjogren's, bilateral: Secondary | ICD-10-CM | POA: Diagnosis not present

## 2023-03-22 DIAGNOSIS — Z961 Presence of intraocular lens: Secondary | ICD-10-CM | POA: Diagnosis not present

## 2023-03-22 DIAGNOSIS — H02834 Dermatochalasis of left upper eyelid: Secondary | ICD-10-CM | POA: Diagnosis not present

## 2023-03-22 DIAGNOSIS — H401131 Primary open-angle glaucoma, bilateral, mild stage: Secondary | ICD-10-CM | POA: Diagnosis not present

## 2023-03-22 DIAGNOSIS — Q141 Congenital malformation of retina: Secondary | ICD-10-CM | POA: Diagnosis not present

## 2023-03-22 DIAGNOSIS — E119 Type 2 diabetes mellitus without complications: Secondary | ICD-10-CM | POA: Diagnosis not present

## 2023-03-22 LAB — HM DIABETES EYE EXAM

## 2023-04-04 ENCOUNTER — Ambulatory Visit: Payer: Medicare Other | Admitting: Nurse Practitioner

## 2023-04-04 NOTE — Progress Notes (Deleted)
[name], CMA,acting as a Neurosurgeon for [name], FNP.,have documented all relevant documentation on the behalf of [name], FNP,as directed by  [name], FNP while in the presence of [name], FNP.  Subjective:  Patient ID: Joanna Sandoval , female    DOB: January 28, 1946 , 77 y.o.   MRN: [id number]  No chief complaint on file.   HPI  Patient presents today for a bp and dm follow up, Patient reports compliance with medication. Patient denies any chest pain, SOB, or headaches. Patient has no concerns today.     Past Medical History:  Diagnosis Date  . Arthritis   . Diabetes mellitus   . Hypercholesteremia   . Hypertension   . Post-menopausal bleeding   . Postmenopausal bleeding 08/30/2011   Normal EBX 09/2011.     Family History  Problem Relation Age of Onset  . Hypertension Mother   . Parkinsonism Father   . Hypertension Father   . Cancer Father        ?lung  . Diabetes Sister      Current Outpatient Medications:  .  Accu-Chek Softclix Lancets lancets, USE AS DIRECTED, Disp: 200 each, Rfl: 2 .  albuterol (VENTOLIN HFA) 108 (90 Base) MCG/ACT inhaler, Inhale 2 puffs into the lungs every 6 (six) hours as needed., Disp: 18 g, Rfl: 1 .  atenolol (TENORMIN) 100 MG tablet, Take 100 mg by mouth daily., Disp: , Rfl:  .  atorvastatin (LIPITOR) 40 MG tablet, Take 40 mg by mouth daily., Disp: , Rfl:  .  Blood Glucose Monitoring Suppl (ACCU-CHEK GUIDE) w/Device KIT, USE TO CHECK BLOOD SUGAR TWICE  DAILY AT 10AM AND 5 PM, Disp: 1 kit, Rfl: 1 .  D3-50 50000 units capsule, Take 50,000 Units by mouth once a week., Disp: , Rfl: 0 .  diclofenac sodium (VOLTAREN) 1 % GEL, Apply 2 g topically 4 (four) times daily. (Patient not taking: Reported on 03/13/2023), Disp: 100 g, Rfl: 1 .  dorzolamide-timolol (COSOPT) 2-0.5 % ophthalmic solution, 1 drop 2 (two) times daily., Disp: , Rfl:  .  FARXIGA 10 MG TABS tablet, TAKE 1 TABLET BY MOUTH DAILY  BEFORE BREAKFAST, Disp: 100 tablet,  Rfl: 2 .  glimepiride (AMARYL) 2 MG tablet, TAKE 1 TABLET BY MOUTH IN THE  MORNING AND AT BEDTIME, Disp: 200 tablet, Rfl: 2 .  glucose blood (ACCU-CHEK GUIDE) test strip, USE AS DIRECTED, Disp: 200 strip, Rfl: 2 .  [name] KIT, Use to test blood sugar twice daily as directed. E11.65, Disp: 1 kit, Rfl: 12 .  latanoprost (XALATAN) 0.005 % ophthalmic solution, 1 drop at bedtime., Disp: , Rfl:  .  losartan (COZAAR) 50 MG tablet, Take 50 mg by mouth daily., Disp: , Rfl:    No Known Allergies   Review of Systems   There were no vitals filed for this visit. There is no height or weight on file to calculate BMI.  Wt Readings from Last 3 Encounters:  03/13/23 199 lb (90.3 kg)  11/28/22 197 lb 12.8 oz (89.7 kg)  11/21/22 190 lb (86.2 kg)    The 10-year ASCVD risk score (Arnett DK, et al., 2019) is: 26.1%   Values used to calculate the score:     Age: 76 years     Sex: Female     Is Non-Hispanic African American: Yes     Diabetic: Yes     Tobacco smoker: No     Systolic Blood Pressure: 130 mmHg  Is BP treated: Yes     HDL Cholesterol: 47 mg/dL     Total Cholesterol: 149 mg/dL  Objective:  Physical Exam      Assessment And Plan:  Type 2 diabetes mellitus with other ophthalmic complication, without long-term current use of insulin (HCC)  Essential hypertension    No follow-ups on file.  Patient was given opportunity to ask questions. Patient verbalized understanding of the plan and was able to repeat key elements of the plan. All questions were answered to their satisfaction.    Jeanell Sparrow, FNP, have reviewed all documentation for this visit. The documentation on 04/04/23 for the exam, diagnosis, procedures, and orders are all accurate and complete.   IF YOU HAVE BEEN REFERRED TO A SPECIALIST, IT MAY TAKE 1-2 WEEKS TO SCHEDULE/PROCESS THE REFERRAL. IF YOU HAVE NOT HEARD FROM US/SPECIALIST IN TWO WEEKS, PLEASE GIVE [place] A CALL AT [phone] X  [phone].

## 2023-04-06 ENCOUNTER — Ambulatory Visit: Payer: Self-pay

## 2023-04-06 NOTE — Patient Outreach (Signed)
  Care Coordination   04/06/2023 Name: Joanna Sandoval MRN: [id number] DOB: 1945-06-23   Care Coordination Outreach Attempts:  An unsuccessful outreach was attempted for an appointment today.  Follow Up Plan:  Additional outreach attempts will be made to offer the patient complex care management information and services.   Encounter Outcome:  Patient Request to Call Back   Care Coordination Interventions:  No, not indicated    Delsa Sale RN BSN CCM [place]  [facility], [facility] Nurse Care Coordinator  Direct Dial: [phone] Website: [email or web address]

## 2023-04-16 ENCOUNTER — Telehealth: Payer: Self-pay | Admitting: Neurology

## 2023-04-16 ENCOUNTER — Other Ambulatory Visit: Payer: Self-pay | Admitting: Nurse Practitioner

## 2023-04-16 DIAGNOSIS — I739 Peripheral vascular disease, unspecified: Secondary | ICD-10-CM

## 2023-04-16 DIAGNOSIS — E1139 Type 2 diabetes mellitus with other diabetic ophthalmic complication: Secondary | ICD-10-CM

## 2023-04-16 NOTE — Telephone Encounter (Signed)
 Pt confirming upcoming appt details

## 2023-04-23 ENCOUNTER — Ambulatory Visit: Payer: Medicare Other | Admitting: Nurse Practitioner

## 2023-04-26 ENCOUNTER — Encounter: Payer: Self-pay | Admitting: Nurse Practitioner

## 2023-04-26 ENCOUNTER — Ambulatory Visit: Payer: 59 | Admitting: Nurse Practitioner

## 2023-04-26 VITALS — BP 110/74 | HR 64 | Temp 97.6°F | Ht 62.0 in | Wt 196.8 lb

## 2023-04-26 DIAGNOSIS — Z2821 Immunization not carried out because of patient refusal: Secondary | ICD-10-CM

## 2023-04-26 DIAGNOSIS — I119 Hypertensive heart disease without heart failure: Secondary | ICD-10-CM | POA: Diagnosis not present

## 2023-04-26 DIAGNOSIS — Z23 Encounter for immunization: Secondary | ICD-10-CM | POA: Diagnosis not present

## 2023-04-26 DIAGNOSIS — E1151 Type 2 diabetes mellitus with diabetic peripheral angiopathy without gangrene: Secondary | ICD-10-CM

## 2023-04-26 DIAGNOSIS — E66812 Obesity, class 2: Secondary | ICD-10-CM

## 2023-04-26 DIAGNOSIS — E559 Vitamin D deficiency, unspecified: Secondary | ICD-10-CM

## 2023-04-26 DIAGNOSIS — I739 Peripheral vascular disease, unspecified: Secondary | ICD-10-CM

## 2023-04-26 DIAGNOSIS — I700 Atherosclerosis of aorta: Secondary | ICD-10-CM

## 2023-04-26 DIAGNOSIS — R350 Frequency of micturition: Secondary | ICD-10-CM | POA: Diagnosis not present

## 2023-04-26 DIAGNOSIS — Z6836 Body mass index (BMI) 36.0-36.9, adult: Secondary | ICD-10-CM

## 2023-04-26 NOTE — Assessment & Plan Note (Signed)
 She is wearing adult briefs daily especially when leaving home for long periods. Encouraged to attempt at toileting every 2 hours to help decrease risk for incontinence she is also advised to change her brief regularly

## 2023-04-26 NOTE — Progress Notes (Signed)
 LILLETTE Kristeen JINNY Gladis, CMA,acting as a neurosurgeon for [name], FNP.,have documented all relevant documentation on the behalf of [name], FNP,as directed by  [name], FNP while in the presence of [name], FNP.  Subjective:  Patient ID: Joanna Sandoval , female    DOB: 1946-01-04 , 78 y.o.   MRN: [id number]  Chief Complaint  Patient presents with   Hypertension   Diabetes    HPI  Patient presents today for a bp and dm follow up, Patient reports compliance with medication. Patient denies any chest pain, SOB, or headaches. Patient has no concerns today. Patient blood sugar in office was 169.  Hypertension This is a chronic problem. The current episode started more than 1 year ago. The problem is unchanged. The problem is controlled. Pertinent negatives include no anxiety, headaches or shortness of breath. There are no associated agents to hypertension. Risk factors for coronary artery disease include obesity and sedentary lifestyle. Past treatments include angiotensin blockers. Compliance problems include exercise.  Hypertensive end-organ damage includes retinopathy. There is no history of angina. There is no history of chronic renal disease.  Diabetes She presents for her follow-up diabetic visit. She has type 2 diabetes mellitus. Her disease course has been stable. Pertinent negatives for hypoglycemia include no dizziness or headaches. There are no hypoglycemic complications. Diabetic complications include retinopathy.     Past Medical History:  Diagnosis Date   Arthritis    Diabetes mellitus    Hypercholesteremia    Hypertension    Post-menopausal bleeding    Postmenopausal bleeding 08/30/2011   Normal EBX 09/2011.     Family History  Problem Relation Age of Onset   Hypertension Mother    Parkinsonism Father    Hypertension Father    Cancer Father        ?lung   Diabetes Sister      Current Outpatient Medications:    Accu-Chek Softclix Lancets lancets, USE AS  DIRECTED, Disp: 200 each, Rfl: 2   albuterol  (VENTOLIN  HFA) 108 (90 Base) MCG/ACT inhaler, Inhale 2 puffs into the lungs every 6 (six) hours as needed., Disp: 18 g, Rfl: 1   atenolol  (TENORMIN ) 100 MG tablet, Take 100 mg by mouth daily., Disp: , Rfl:    atorvastatin  (LIPITOR) 40 MG tablet, Take 40 mg by mouth daily., Disp: , Rfl:    Blood Glucose Monitoring Suppl (ACCU-CHEK GUIDE) w/Device KIT, USE TO CHECK BLOOD SUGAR TWICE  DAILY AT 10AM AND 5 PM, Disp: 1 kit, Rfl: 1   D3-50 50000 units capsule, Take 50,000 Units by mouth once a week., Disp: , Rfl: 0   dorzolamide -timolol  (COSOPT ) 2-0.5 % ophthalmic solution, 1 drop 2 (two) times daily., Disp: , Rfl:    FARXIGA  10 MG TABS tablet, TAKE 1 TABLET BY MOUTH DAILY  BEFORE BREAKFAST, Disp: 100 tablet, Rfl: 2   glimepiride  (AMARYL ) 2 MG tablet, TAKE 1 TABLET BY MOUTH IN THE  MORNING AND AT BEDTIME, Disp: 200 tablet, Rfl: 2   glucose blood (ACCU-CHEK GUIDE) test strip, USE AS DIRECTED, Disp: 200 strip, Rfl: 2   [name] KIT, Use to test blood sugar twice daily as directed. E11.65, Disp: 1 kit, Rfl: 12   latanoprost  (XALATAN ) 0.005 % ophthalmic solution, 1 drop at bedtime., Disp: , Rfl:    losartan  (COZAAR ) 50 MG tablet, Take 50 mg by mouth daily., Disp: , Rfl:    diclofenac  sodium (VOLTAREN ) 1 % GEL, Apply 2 g topically 4 (four) times daily. (Patient not  taking: Reported on 04/26/2023), Disp: 100 g, Rfl: 1   No Known Allergies   Review of Systems  Constitutional: Negative.   Respiratory:  Negative for apnea and shortness of breath.   Cardiovascular: Negative.   Neurological:  Negative for dizziness and headaches.  Psychiatric/Behavioral: Negative.       Today's Vitals   04/26/23 0857  BP: 110/74  Pulse: 64  Temp: 97.6 F (36.4 C)  TempSrc: Oral  Weight: 196 lb 12.8 oz (89.3 kg)  Height: 5' 2 (1.575 m)  PainSc: 0-No pain   Body mass index is 36 kg/m.  Wt Readings from Last 3 Encounters:  04/26/23 196 lb  12.8 oz (89.3 kg)  03/13/23 199 lb (90.3 kg)  11/28/22 197 lb 12.8 oz (89.7 kg)     Objective:  Physical Exam Vitals reviewed.  Constitutional:      General: She is not in acute distress.    Appearance: Normal appearance. She is obese.  HENT:     Head: Normocephalic.  Eyes:     Extraocular Movements: Extraocular movements intact.     Conjunctiva/sclera: Conjunctivae normal.     Pupils: Pupils are equal, round, and reactive to light.  Cardiovascular:     Rate and Rhythm: Normal rate and regular rhythm.     Pulses: Normal pulses.     Heart sounds: Normal heart sounds. No murmur heard. Pulmonary:     Effort: Pulmonary effort is normal. No respiratory distress.     Breath sounds: Normal breath sounds. No wheezing.  Chest:     Chest wall: No mass.  Abdominal:     General: Abdomen is flat. Bowel sounds are normal. There is no distension.     Palpations: Abdomen is soft. There is no mass.     Tenderness: There is no abdominal tenderness.  Musculoskeletal:     Comments: Using a cane for support  Skin:    General: Skin is warm and dry.     Capillary Refill: Capillary refill takes less than 2 seconds.  Neurological:     General: No focal deficit present.     Mental Status: She is alert and oriented to person, place, and time.     Cranial Nerves: No cranial nerve deficit.     Motor: No weakness.  Psychiatric:        Mood and Affect: Mood normal.        Behavior: Behavior normal.        Thought Content: Thought content normal.        Judgment: Judgment normal.         Assessment And Plan:  Type 2 diabetes mellitus with diabetic peripheral angiopathy without gangrene, without long-term current use of insulin  (HCC) Assessment & Plan: hgbA1c stable, continue current medications. Will check HgbA1c.   Orders: -     Hemoglobin A1c  Hypertensive heart disease without heart failure Assessment & Plan: Blood pressure well controlled, repeat has improvement.  Continue current  medications.  Orders: -     Microalbumin / creatinine urine ratio -     Basic metabolic panel  Need for influenza vaccination Assessment & Plan: Influenza vaccine administered Encouraged to take Tylenol  as needed for fever or muscle aches.   Orders: -     Flu Vaccine Trivalent High Dose (Fluad)  COVID-19 vaccination declined Assessment & Plan: Declines covid 19 vaccine. Discussed risk of covid 83 and if she changes her mind about the vaccine to call the office. Education has been provided regarding the  importance of this vaccine but patient still declined. Advised may receive this vaccine at local pharmacy or Health Dept.or vaccine clinic. Aware to provide a copy of the vaccination record if obtained from local pharmacy or Health Dept.  Encouraged to take multivitamin, vitamin d , vitamin c and zinc to increase immune system. Aware can call office if would like to have vaccine here at office. Verbalized acceptance and understanding.    Vitamin D  deficiency Assessment & Plan: Will check vitamin D  level and supplement as needed.    Also encouraged to spend 15 minutes in the sun daily.    Orders: -     VITAMIN D  25 Hydroxy (Vit-D Deficiency, Fractures)  PAD (peripheral artery disease) (HCC) Assessment & Plan: Chronic, continue statin.    Aortic atherosclerosis (HCC) Assessment & Plan: Continue statin, tolerating well.  Orders: -     Lipid panel  Urinary frequency Assessment & Plan: She is wearing adult briefs daily especially when leaving home for long periods. Encouraged to attempt at toileting every 2 hours to help decrease risk for incontinence she is also advised to change her brief regularly   Class 2 severe obesity due to excess calories with serious comorbidity and body mass index (BMI) of 36.0 to 36.9 in adult [facility]) Assessment & Plan: She is encouraged to strive for BMI less than 30 to decrease cardiac risk. Advised to aim for at least 150 minutes of exercise per  week.    Obesity, morbid (HCC)    Return for KEEP SAME NEXT.  Patient was given opportunity to ask questions. Patient verbalized understanding of the plan and was able to repeat key elements of the plan. All questions were answered to their satisfaction.    LILLETTE [name], FNP, have reviewed all documentation for this visit. The documentation on 04/26/23 for the exam, diagnosis, procedures, and orders are all accurate and complete.   IF YOU HAVE BEEN REFERRED TO A SPECIALIST, IT MAY TAKE 1-2 WEEKS TO SCHEDULE/PROCESS THE REFERRAL. IF YOU HAVE NOT HEARD FROM US /SPECIALIST IN TWO WEEKS, PLEASE GIVE US  A CALL AT [phone].

## 2023-04-29 LAB — LIPID PANEL
Chol/HDL Ratio: 3 {ratio} (ref 0.0–4.4)
Cholesterol, Total: 168 mg/dL (ref 100–199)
HDL: 56 mg/dL (ref >39)
LDL Chol Calc (NIH): 86 mg/dL (ref 0–99)
Triglycerides: 151 mg/dL — ABNORMAL HIGH (ref 0–149)
VLDL Cholesterol Cal: 26 mg/dL (ref 5–40)

## 2023-04-29 LAB — BASIC METABOLIC PANEL
BUN/Creatinine Ratio: 17 (ref 12–28)
BUN: 14 mg/dL (ref 8–27)
CO2: 26 mmol/L (ref 20–29)
Calcium: 9.6 mg/dL (ref 8.7–10.3)
Chloride: 104 mmol/L (ref 96–106)
Creatinine, Ser: 0.83 mg/dL (ref 0.57–1.00)
Glucose: 162 mg/dL — ABNORMAL HIGH (ref 70–99)
Potassium: 4.3 mmol/L (ref 3.5–5.2)
Sodium: 143 mmol/L (ref 134–144)
eGFR: 73 mL/min/{1.73_m2} (ref >59)

## 2023-04-29 LAB — HEMOGLOBIN A1C
Est. average glucose Bld gHb Est-mCnc: 240 mg/dL
Hgb A1c MFr Bld: 10 % — ABNORMAL HIGH (ref 4.8–5.6)

## 2023-04-29 LAB — MICROALBUMIN / CREATININE URINE RATIO
Creatinine, Urine: 67.1 mg/dL
Microalb/Creat Ratio: 18 mg/g{creat} (ref 0–29)
Microalbumin, Urine: 12 ug/mL

## 2023-04-29 LAB — VITAMIN D 25 HYDROXY (VIT D DEFICIENCY, FRACTURES): Vit D, 25-Hydroxy: 31.8 ng/mL (ref 30.0–100.0)

## 2023-04-30 ENCOUNTER — Ambulatory Visit
Admission: RE | Admit: 2023-04-30 | Discharge: 2023-04-30 | Disposition: A | Discharge status: Home / Self Care | Payer: 59 | Source: Ambulatory Visit | Attending: Neurology | Admitting: Neurology

## 2023-04-30 DIAGNOSIS — Z8249 Family history of ischemic heart disease and other diseases of the circulatory system: Secondary | ICD-10-CM | POA: Diagnosis not present

## 2023-04-30 DIAGNOSIS — R519 Headache, unspecified: Secondary | ICD-10-CM

## 2023-04-30 DIAGNOSIS — R351 Nocturia: Secondary | ICD-10-CM

## 2023-04-30 DIAGNOSIS — Z9189 Other specified personal risk factors, not elsewhere classified: Secondary | ICD-10-CM

## 2023-04-30 DIAGNOSIS — G250 Essential tremor: Secondary | ICD-10-CM

## 2023-04-30 DIAGNOSIS — R0681 Apnea, not elsewhere classified: Secondary | ICD-10-CM | POA: Diagnosis not present

## 2023-04-30 DIAGNOSIS — Z820 Family history of epilepsy and other diseases of the nervous system: Secondary | ICD-10-CM

## 2023-04-30 DIAGNOSIS — E669 Obesity, unspecified: Secondary | ICD-10-CM

## 2023-04-30 MED ORDER — GADOPICLENOL 0.5 MMOL/ML IV SOLN
10.0000 mL | Freq: Once | INTRAVENOUS | Status: AC | PRN
  Administered 2023-04-30: 10 mL via INTRAVENOUS

## 2023-05-01 ENCOUNTER — Telehealth: Payer: Self-pay

## 2023-05-01 NOTE — Telephone Encounter (Signed)
 I spoke with the patient and provided the results of the MRI. She verbalized understanding and expressed appreciation for the call. She will proceed with sleep study as scheduled. All questions answered.

## 2023-05-01 NOTE — Telephone Encounter (Signed)
-----   Message from True Mar sent at 05/01/2023  9:28 AM EST ----- Please call and advise the patient that the recent scan, her brain MRI w/wo contrast showed no acute findings and no obvious explanation for her tremor. Chronic, age-appropriate changes were seen, but nothing unusual or alarming. We will proceed with her sleep study as scheduled for later this month. True Mar, MD, PhD

## 2023-05-01 NOTE — Telephone Encounter (Signed)
 I left a voicemail for the patient to return call to discuss MRI. If the patient calls back, please route to POD 4.

## 2023-05-01 NOTE — Telephone Encounter (Signed)
 Pt returned phone call, would like a call back.

## 2023-05-02 NOTE — Progress Notes (Signed)
 We really need to add another medication, her diet will not do it alone so we can send in Jardiance  10 mg daily. She was unable to tolerate rybelsus .   Kindest Regards,   [name], DNP, FNP-BC

## 2023-05-04 ENCOUNTER — Ambulatory Visit: Payer: Self-pay

## 2023-05-04 NOTE — Patient Outreach (Signed)
  Care Coordination   05/04/2023 Name: Joanna Sandoval MRN: [id number] DOB: [birth year]   Care Coordination Outreach Attempts:  An unsuccessful outreach was attempted for an appointment today.  Follow Up Plan:  Additional outreach attempts will be made to offer the patient complex care management information and services.   Encounter Outcome:  No Answer   Care Coordination Interventions:  No, not indicated    Delsa Sale RN BSN CCM [place]  [facility], [facility] Nurse Care Coordinator  Direct Dial: [phone] Website: [email or web address]

## 2023-05-07 DIAGNOSIS — Z2821 Immunization not carried out because of patient refusal: Secondary | ICD-10-CM | POA: Insufficient documentation

## 2023-05-07 DIAGNOSIS — Z23 Encounter for immunization: Secondary | ICD-10-CM | POA: Insufficient documentation

## 2023-05-07 DIAGNOSIS — E1151 Type 2 diabetes mellitus with diabetic peripheral angiopathy without gangrene: Secondary | ICD-10-CM | POA: Insufficient documentation

## 2023-05-07 NOTE — Assessment & Plan Note (Deleted)
 She is encouraged to strive for BMI less than 30 to decrease cardiac risk. Advised to aim for at least 150 minutes of exercise per week.

## 2023-05-07 NOTE — Assessment & Plan Note (Signed)
Continue statin, tolerating well 

## 2023-05-07 NOTE — Assessment & Plan Note (Signed)
Will check vitamin D level and supplement as needed.    Also encouraged to spend 15 minutes in the sun daily.   

## 2023-05-07 NOTE — Assessment & Plan Note (Signed)
Chronic, continue statin. 

## 2023-05-07 NOTE — Assessment & Plan Note (Signed)
Blood pressure well controlled, repeat has improvement.  Continue current medications.

## 2023-05-07 NOTE — Assessment & Plan Note (Signed)
Influenza vaccine administered Encouraged to take Tylenol as needed for fever or muscle aches.

## 2023-05-07 NOTE — Assessment & Plan Note (Signed)

## 2023-05-07 NOTE — Assessment & Plan Note (Signed)
hgbA1c stable, continue current medications. Will check HgbA1c.

## 2023-05-07 NOTE — Assessment & Plan Note (Signed)
 She is encouraged to strive for BMI less than 30 to decrease cardiac risk. Advised to aim for at least 150 minutes of exercise per week.

## 2023-05-15 ENCOUNTER — Ambulatory Visit: Payer: 59 | Admitting: Neurology

## 2023-05-15 DIAGNOSIS — Z9189 Other specified personal risk factors, not elsewhere classified: Secondary | ICD-10-CM

## 2023-05-15 DIAGNOSIS — R519 Headache, unspecified: Secondary | ICD-10-CM

## 2023-05-15 DIAGNOSIS — G4733 Obstructive sleep apnea (adult) (pediatric): Secondary | ICD-10-CM | POA: Diagnosis not present

## 2023-05-15 DIAGNOSIS — R0681 Apnea, not elsewhere classified: Secondary | ICD-10-CM

## 2023-05-15 DIAGNOSIS — E669 Obesity, unspecified: Secondary | ICD-10-CM

## 2023-05-15 DIAGNOSIS — Z8249 Family history of ischemic heart disease and other diseases of the circulatory system: Secondary | ICD-10-CM

## 2023-05-15 DIAGNOSIS — R351 Nocturia: Secondary | ICD-10-CM

## 2023-05-15 DIAGNOSIS — G250 Essential tremor: Secondary | ICD-10-CM

## 2023-05-15 DIAGNOSIS — G4734 Idiopathic sleep related nonobstructive alveolar hypoventilation: Secondary | ICD-10-CM

## 2023-05-15 DIAGNOSIS — Z820 Family history of epilepsy and other diseases of the nervous system: Secondary | ICD-10-CM

## 2023-05-16 ENCOUNTER — Other Ambulatory Visit: Payer: Self-pay | Admitting: Nurse Practitioner

## 2023-05-16 NOTE — Progress Notes (Signed)
See procedure note.

## 2023-05-18 NOTE — Addendum Note (Signed)
Addended by: Huston Foley on: 05/18/2023 12:11 PM   Modules accepted: Orders

## 2023-05-18 NOTE — Procedures (Signed)
[facility]  HOME SLEEP TEST (Watch PAT) REPORT  STUDY DATE: 05/15/2023  DOB: Apr 25, 1945  MRN: [id number]  ORDERING CLINICIAN: Huston Foley, MD, PhD   REFERRING CLINICIAN: [name], FNP   CLINICAL INFORMATION/HISTORY: 78 year old female with an underlying medical history of diabetes, hypertension, hyperlipidemia, arthritis, vitamin D deficiency, artery disease, and obesity, who reports tremors, snoring, witnessed apneas, morning headaches, and waking up with a sense of gasping for air.   Epworth sleepiness score: 12/24.  BMI: 36.4 kg/m  FINDINGS:   Sleep Summary:   Total Recording Time (hours, min): 8 hours, 19 min  Total Sleep Time (hours, min):  7 hours, 40 min  Percent REM (%):    12.6%   Respiratory Indices:   Calculated pAHI (per hour):  71.1/hour         REM pAHI:    72.1/hour       NREM pAHI: 70.8/hour  Central pAHI: 3.4/hour  Oxygen Saturation Statistics:    Oxygen Saturation (%) Mean: 87%   Minimum oxygen saturation (%):                 68%   O2 Saturation Range (%): 68-96%    O2 Saturation (minutes) <=88%: 179.3 min  Pulse Rate Statistics:   Pulse Mean (bpm):    58/min    Pulse Range (49 - 91/min)   IMPRESSION:   OSA (obstructive sleep apnea), severe Nocturnal Hypoxemia  RECOMMENDATION:   This home sleep test demonstrates severe obstructive sleep apnea with a total AHI of 71.1/hour and O2 nadir of 68% with significant time below or at 88% saturation of nearly 3 hours for the study, indicating nocturnal hypoxemia. Snoring was detected, in the mild to moderate range, at times louder.  Treatment with positive airway pressure is highly recommended. The patient will be advised to proceed with an autoPAP titration/trial at home. A laboratory attended titration study can be considered in the future for optimization of treatment settings and to improve tolerance and compliance, if needed, down the road. Alternative treatment  options are limited secondary to the severity of the patient's sleep disordered breathing, but may include surgical treatment with an implantable hypoglossal nerve stimulator (in carefully selected candidates, meeting criteria).  Concomitant weight loss is recommended (where clinically appropriate). Please note, that untreated obstructive sleep apnea may carry additional perioperative morbidity. Patients with significant obstructive sleep apnea should receive perioperative PAP therapy and the surgeons and particularly the anesthesiologist should be informed of the diagnosis and the severity of the sleep disordered breathing. The patient should be cautioned not to drive, work at heights, or operate dangerous or heavy equipment when tired or sleepy. Review and reiteration of good sleep hygiene measures should be pursued with any patient. Other causes of the patient's symptoms, including circadian rhythm disturbances, an underlying mood disorder, medication effect and/or an underlying medical problem cannot be ruled out based on this test. Clinical correlation is recommended. While waiting for autoPAP set up at home, I recommend patient sleep with HOB mildly elevated between 30-45 degrees and primarily sleep on her sides.  The patient and her referring provider will be notified of the test results. The patient will be seen in follow up in sleep clinic at [facility].  I certify that I have reviewed the raw data recording prior to the issuance of this report in accordance with the standards of the American Academy of Sleep Medicine (AASM).    INTERPRETING PHYSICIAN:   Huston Foley, MD, PhD Medical Director, [facility]  at [facility]) Diplomat, ABPN (Neurology and Sleep)   [facility] [address] [place], [place] [address] [phone]

## 2023-05-22 ENCOUNTER — Telehealth: Payer: Self-pay | Admitting: *Deleted

## 2023-05-22 NOTE — Telephone Encounter (Signed)
-----   Message from True Mar sent at 05/18/2023 12:10 PM EST ----- Urgent set up requested on PAP therapy, due to severe OSA. Patient referred by PCP, seen by me on 03/13/2023, patient had a HST on 05/15/2023.    Please call and notify the patient that the recent home sleep test showed obstructive sleep apnea in the severe range. I recommend treatment for this in the form of autoPAP, which means, that we don't have to bring her in for a sleep study with CPAP, but will let her start using a so called autoPAP machine at home, through a DME company (of her choice, or as per insurance requirement). The DME representative will fit the patient with a mask of choice, educate her on how to use the machine, how to put the mask on, etc. I have placed an order in the chart. Please send the order to a local DME, talk to patient, send report to referring MD. Please also reinforce the need for compliance with treatment. We will need a FU in sleep clinic for 10 weeks post-PAP set up, please arrange that with me or one of our NPs.   While waiting for autoPAP set up at home, I recommend patient sleep with HOB mildly elevated between 30-45 degrees and primarily sleep on her sides. Working consistently on wt loss is also highly recommended.   Thanks,   True Mar, MD, PhD [facility])

## 2023-05-22 NOTE — Telephone Encounter (Signed)
 RE: new autopap user SEVERE OSA urgent set up Received: Today [place], [place] Neysa Nena GORMAN, RN; [place], [place]; Rumalda Raring; Nickola Stabs; [facility]ble, Jada Hello Pulled cpap order will have processed.  Thank you!     Previous Messages    ----- Message ----- From: Neysa Nena GORMAN, RN Sent: 05/22/2023  11:45 AM EST To: Raring Rumalda; [facility]; * Subject: new autopap user SEVERE OSA urgent set up      New order in EPIC  Urgent set up.  OSA Severe.    [place] CHARLENA Ku Female, 78 y.o., July 25, 1945 MRN: [id number] Phone: [phone] Thank you  [place]

## 2023-05-22 NOTE — Telephone Encounter (Signed)
 I called Joanna Sandoval and relayed the results of her sleep study. Severe OSA, urgent set up.  I relayed autopap is what Dr. Buck recommends for her.  Use 4 hr or more everynight. We see back for compliance appt made with JM/NP at 1545 08-09-2023.  [name] will be sent information and she should hear from them if not after a week to call them I gave her the [phone] or give us  a call back. Until she gets her machine then to sleep with HOB elevated 30-45 degrees, and primarily on her sides. She verbalized understanding.  Will call back as needed.  Sent orders to [facility].  Joanna Sandoval has [employer].

## 2023-05-30 ENCOUNTER — Other Ambulatory Visit: Payer: Self-pay

## 2023-05-30 DIAGNOSIS — E1151 Type 2 diabetes mellitus with diabetic peripheral angiopathy without gangrene: Secondary | ICD-10-CM

## 2023-05-30 MED ORDER — EMPAGLIFLOZIN 10 MG PO TABS
10.0000 mg | ORAL_TABLET | Freq: Every day | ORAL | 2 refills | Status: DC
Start: 2023-05-30 — End: 2023-09-11

## 2023-06-15 ENCOUNTER — Telehealth: Payer: Self-pay | Admitting: *Deleted

## 2023-06-15 NOTE — Progress Notes (Signed)
 Complex Care Management Care Guide Note  06/15/2023 Name: Joanna Sandoval MRN: [id number] DOB: 09-12-1945  Joanna Sandoval is a 78 y.o. year old female who is a primary care patient of [name], FNP and is actively engaged with the care management team. I reached out to Lemmie Evens by phone today to assist with re-scheduling  with the RN Case Manager.  Follow up plan: Unsuccessful telephone outreach attempt made. A HIPAA compliant phone message was left for the patient providing contact information and requesting a return call.  Gwenevere Ghazi  [facility]  [facility], [facility] Guide  Direct Dial: [phone]  Fax [phone]

## 2023-06-22 DIAGNOSIS — G4733 Obstructive sleep apnea (adult) (pediatric): Secondary | ICD-10-CM | POA: Diagnosis not present

## 2023-06-22 NOTE — Progress Notes (Signed)
 Complex Care Management Care Guide Note  06/22/2023 Name: Joanna Sandoval MRN: [id number] DOB: [birth year]  Joanna Sandoval is a 78 y.o. year old female who is a primary care patient of [name], FNP and is actively engaged with the care management team. I reached out to Lemmie Evens by phone today to assist with re-scheduling  with the RN Case Manager.  Follow up plan: Unsuccessful telephone outreach attempt made. A HIPAA compliant phone message was left for the patient providing contact information and requesting a return call.No further outreach attempts will be made at this time. We have been unable to contact the patient to reschedule for complex care management services.   Gwenevere Ghazi  [facility]  [facility] Institute, [facility] Guide  Direct Dial: [phone]  Fax [phone]

## 2023-06-25 DIAGNOSIS — E119 Type 2 diabetes mellitus without complications: Secondary | ICD-10-CM | POA: Diagnosis not present

## 2023-06-25 DIAGNOSIS — M150 Primary generalized (osteo)arthritis: Secondary | ICD-10-CM | POA: Diagnosis not present

## 2023-06-25 DIAGNOSIS — I10 Essential (primary) hypertension: Secondary | ICD-10-CM | POA: Diagnosis not present

## 2023-06-25 DIAGNOSIS — E782 Mixed hyperlipidemia: Secondary | ICD-10-CM | POA: Diagnosis not present

## 2023-07-23 DIAGNOSIS — G4733 Obstructive sleep apnea (adult) (pediatric): Secondary | ICD-10-CM | POA: Diagnosis not present

## 2023-08-06 ENCOUNTER — Other Ambulatory Visit: Payer: Self-pay | Admitting: Nurse Practitioner

## 2023-08-06 NOTE — Progress Notes (Signed)
 [facility] [address] [place]. [place] [address] [phone]       OFFICE FOLLOW UP NOTE  [name] Date of Birth:  Feb 06, 1946 Medical Record Number:  [id number]    Primary neurologist: Dr. Omar Bibber Reason for visit: Initial CPAP follow-up    SUBJECTIVE:   CHIEF COMPLAINT:  Chief Complaint  Patient presents with   Obstructive Sleep Apnea    Rm 8 alone Pt is well, reports she forgets to put CPAP mask on sometimes when she goes to bed. Otherwise she is stable, still adjusting to it.     Follow-up visit:  Prior visit: 03/13/2023 with Dr. Omar Bibber (initial consult visit)  Brief HPI:   Joanna Sandoval is a 78 y.o. female who was evaluated by Dr. Omar Bibber in 02/2023 for 2-year history of hand tremors most noticeable with action and felt most consistent with essential tremor.  Noted sister with hx of tremor and father with history of Parkinson's disease but no concerning findings consistent with parkinsonism on exam.  Also noted poor sleep with sleep disruption, nocturia, snoring, excessive daytime sleepiness and occasional morning headaches.  ESS 12/24.  No medications for tremors recommended as she is already on beta-blocker and concern other medication such as primidone would increase risk of falls.  Recommended proceeding with MRI brain and sleep study.  HST 04/2023 showed severe sleep apnea with total AHI of 71.1/h and O2 nadir of 68% with significant time below or at 88% saturation of nearly 3 hours for the study, indicating nocturnal hypoxemia.  Recommend initiation of AutoPap.  MRI brain 04/2023 mild chronic microvascular ischemic changes otherwise unremarkable    Interval history:  Returns today for initial CPAP compliance visit.  Reports gradual improvement of CPAP tolerance.  She has been having difficulty wearing for longer than 4 hours due to allergies with increased nasal congestion, at times will fall asleep in her chair without using CPAP  and sometimes will remove her mask while sleeping. She does not notice any leaks despite high leak rate on report there suspect high leak rate from her removing mask while sleeping.  She does note improvement of sleep quality and daytime energy levels with CPAP use.   Reports tremors have been stable without any progression since prior visit.       ROS:   14 system review of systems performed and negative with exception of those listed in HPI  PMH:  Past Medical History:  Diagnosis Date   Arthritis    Diabetes mellitus    Hypercholesteremia    Hypertension    Post-menopausal bleeding    Postmenopausal bleeding 08/30/2011   Normal EBX 09/2011.    PSH:  Past Surgical History:  Procedure Laterality Date   CATARACT EXTRACTION     COLONOSCOPY     DILATION AND CURETTAGE OF UTERUS     HYSTEROSCOPY WITH D & C N/A 05/02/2017   Procedure: DILATATION AND CURETTAGE /HYSTEROSCOPY;  Surgeon: Verlyn Goad, MD;  Location: [facility];  Service: Gynecology;  Laterality: N/A;   KNEE ARTHROSCOPY      Social History:  Social History   Socioeconomic History   Marital status: Divorced    Spouse name: Not on file   Number of children: Not on file   Years of education: Not on file   Highest education level: Not on file  Occupational History   Occupation: retired Lawyer  Tobacco Use   Smoking status: Former    Types: Cigarettes   Smokeless  tobacco: Never  Vaping Use   Vaping status: Never Used  Substance and Sexual Activity   Alcohol use: No   Drug use: No   Sexual activity: Not Currently    Birth control/protection: Post-menopausal  Other Topics Concern   Not on file  Social History Narrative   Not on file   Social Drivers of Health   Financial Resource Strain: Medium Risk (09/14/2022)   Overall Financial Resource Strain (CARDIA)    Difficulty of Paying Living Expenses: Somewhat hard  Food Insecurity: No Food Insecurity (09/14/2022)   Hunger Vital Sign     Worried About Running Out of Food in the Last Year: Never true    Ran Out of Food in the Last Year: Never true  Transportation Needs: No Transportation Needs (09/14/2022)   PRAPARE - Administrator, Civil Service (Medical): No    Lack of Transportation (Non-Medical): No  Physical Activity: Inactive (09/14/2022)   Exercise Vital Sign    Days of Exercise per Week: 0 days    Minutes of Exercise per Session: 0 min  Stress: No Stress Concern Present (09/14/2022)   [employer] of Occupational Health - Occupational Stress Questionnaire    Feeling of Stress : Only a little  Social Connections: Moderately Isolated (02/16/2021)   Social Connection and Isolation Panel [NHANES]    Frequency of Communication with Friends and Family: More than three times a week    Frequency of Social Gatherings with Friends and Family: Twice a week    Attends Religious Services: Never    Database administrator or Organizations: Yes    Attends Banker Meetings: Never    Marital Status: Divorced  Catering manager Violence: Not At Risk (02/16/2021)   Humiliation, Afraid, Rape, and Kick questionnaire    Fear of Current or Ex-Partner: No    Emotionally Abused: No    Physically Abused: No    Sexually Abused: No    Family History:  Family History  Problem Relation Age of Onset   Hypertension Mother    Parkinsonism Father    Hypertension Father    Cancer Father        ?lung   Diabetes Sister     Medications:   Current Outpatient Medications on File Prior to Visit  Medication Sig Dispense Refill   Accu-Chek Softclix Lancets lancets USE AS DIRECTED 200 each 2   albuterol  (VENTOLIN  HFA) 108 (90 Base) MCG/ACT inhaler Inhale 2 puffs into the lungs every 6 (six) hours as needed. 18 g 1   atenolol (TENORMIN) 100 MG tablet Take 100 mg by mouth daily.     atorvastatin  (LIPITOR) 40 MG tablet Take 40 mg by mouth daily.     Blood Glucose Monitoring Suppl (ACCU-CHEK GUIDE) w/Device KIT USE TO  CHECK BLOOD SUGAR TWICE  DAILY AT 10AM AND 5 PM 1 kit 1   D3-50 50000 units capsule Take 50,000 Units by mouth once a week.  0   dapagliflozin  propanediol (FARXIGA ) 10 MG TABS tablet Take 1 tablet (10 mg total) by mouth daily before breakfast. 100 tablet 2   diclofenac  sodium (VOLTAREN ) 1 % GEL Apply 2 g topically 4 (four) times daily. 100 g 1   dorzolamide-timolol (COSOPT) 2-0.5 % ophthalmic solution 1 drop 2 (two) times daily.     empagliflozin  (JARDIANCE ) 10 MG TABS tablet Take 1 tablet (10 mg total) by mouth daily before breakfast. 30 tablet 2   glimepiride  (AMARYL ) 2 MG tablet TAKE 1 TABLET BY MOUTH  IN THE  MORNING AND AT BEDTIME 200 tablet 2   glucose blood (ACCU-CHEK GUIDE) test strip USE AS DIRECTED 200 strip 2   [name] KIT Use to test blood sugar twice daily as directed. E11.65 1 kit 12   latanoprost (XALATAN) 0.005 % ophthalmic solution 1 drop at bedtime.     losartan (COZAAR) 50 MG tablet Take 50 mg by mouth daily.     No current facility-administered medications on file prior to visit.    Allergies:  No Known Allergies    OBJECTIVE:  Physical Exam  Vitals:   08/07/23 1536  BP: (!) 152/75  Pulse: 75  Weight: 197 lb (89.4 kg)  Height: 5\' 2"  (1.575 m)   Body mass index is 36.03 kg/m. No results found.   General: well developed, well nourished, very pleasant elderly African-American female, seated, in no evident distress  Neurologic Exam Mental Status: Awake and fully alert.  Fluent speech and language.  No hypophonia.  Oriented to place and time. Recent and remote memory intact. Attention span, concentration and fund of knowledge appropriate. Mood and affect appropriate.  Cranial Nerves: Pupils equal, briskly reactive to light. Extraocular movements full without nystagmus. Visual fields full to confrontation. Hearing intact. Facial sensation intact. Face, tongue, palate moves normally and symmetrically.  Motor: Normal bulk and tone.  Normal strength in all tested extremity muscles.  Mild bilateral upper extremity postural tremor, slight action tremor bilaterally, no intention tremor or obvious resting tremor.  No rigidity or bradykinesia. Gait and Station: Arises from chair without difficulty. Stance is normal. Gait demonstrates wide-based gait with slow cautious steps and use of rollator walker        ASSESSMENT/PLAN: Joanna Sandoval is a 78 y.o. year old female    OSA on CPAP : Decreased greater than 4-hour compliance due to difficulty tolerating current mask and high leak rate, will request mask refitting or change of interface.  Continue current pressure settings as suspect slightly elevated residual AHI due to high leak rate.  Discussed continued nightly usage and importance of ensuring greater than 4 hours nightly for optimal benefit and per insurance purposes.  Continue to follow with DME company for any needed supplies or CPAP related concerns Essential tremor:  Overall stable without progression.   Unable to appreciate parkinsonism features on exam. Continue to monitor     Follow up in 6 months or call earlier if needed   CC:  PCP: Susanna Epley, FNP    I spent 25 minutes of face-to-face and non-face-to-face time with patient.  This included previsit chart review, lab review, study review, order entry, electronic health record documentation, patient education and discussion regarding above diagnoses and treatment plan and answered all other questions to patient's satisfaction  Johny Nap, [facility]  [facility] [address] [place], [place] [address]  Phone [phone] Fax [phone] Note: This document was prepared with digital dictation and possible smart phrase technology. Any transcriptional errors that result from this process are unintentional.

## 2023-08-06 NOTE — Telephone Encounter (Signed)
 Copied from CRM [id number]. Topic: Clinical - Medication Refill >> Aug 06, 2023  3:28 PM Tiffany B wrote: Most Recent Primary Care Visit:  Provider: Susanna Epley  Department: [facility]  Visit Type: OFFICE VISIT  Date: 04/26/2023  Medication: FARXIGA  10 MG TABS tablet  Has the patient contacted their pharmacy? Yes  (Agent: If yes, when and what did the pharmacy advise?) Pharmacy states they will send in a request   Is this the correct pharmacy for this prescription? Yes  This is the patient's preferred pharmacy:  [facility] [phone] - [place], [place] - [address] AT [facility] [address] [place] [address] Phone: [phone] Fax: [phone]   Has the prescription been filled recently? Yes  Is the patient out of the medication? No  Has the patient been seen for an appointment in the last year OR does the patient have an upcoming appointment? Yes  Can we respond through MyChart? No  Agent: Please be advised that Rx refills may take up to 3 business days. We ask that you follow-up with your pharmacy.

## 2023-08-07 ENCOUNTER — Ambulatory Visit (INDEPENDENT_AMBULATORY_CARE_PROVIDER_SITE_OTHER): Payer: 59 | Admitting: Adult Health

## 2023-08-07 ENCOUNTER — Encounter: Payer: Self-pay | Admitting: Adult Health

## 2023-08-07 VITALS — BP 152/75 | HR 75 | Ht 62.0 in | Wt 197.0 lb

## 2023-08-07 DIAGNOSIS — G4733 Obstructive sleep apnea (adult) (pediatric): Secondary | ICD-10-CM | POA: Diagnosis not present

## 2023-08-07 DIAGNOSIS — G250 Essential tremor: Secondary | ICD-10-CM | POA: Diagnosis not present

## 2023-08-07 MED ORDER — DAPAGLIFLOZIN PROPANEDIOL 10 MG PO TABS: 10.0000 mg | ORAL_TABLET | Freq: Every day | ORAL | 2 refills | Status: DC

## 2023-08-07 NOTE — Patient Instructions (Signed)
 Your Plan:  Will send order to DME [facility] to discuss mask refitting or change of your current mask   Continue nightly use of CPAP with greater than 4 hours per night for optimal benefit and per insurance requirements  Continue to follow with your DME company [employer] for any needs supplies or CPAP related concerns     Follow-up in 6 months or call earlier if needed     Thank you for coming to see us  at [facility]. I hope we have been able to provide you high quality care today.  You may receive a patient satisfaction survey over the next few weeks. We would appreciate your feedback and comments so that we may continue to improve ourselves and the health of our patients.

## 2023-08-15 ENCOUNTER — Encounter: Payer: Self-pay | Admitting: Nurse Practitioner

## 2023-08-15 ENCOUNTER — Ambulatory Visit (INDEPENDENT_AMBULATORY_CARE_PROVIDER_SITE_OTHER): Payer: Medicare Other | Admitting: Nurse Practitioner

## 2023-08-15 VITALS — BP 120/80 | HR 71 | Temp 98.4°F | Ht 62.0 in | Wt 196.2 lb

## 2023-08-15 DIAGNOSIS — G8929 Other chronic pain: Secondary | ICD-10-CM

## 2023-08-15 DIAGNOSIS — R251 Tremor, unspecified: Secondary | ICD-10-CM

## 2023-08-15 DIAGNOSIS — Z2821 Immunization not carried out because of patient refusal: Secondary | ICD-10-CM

## 2023-08-15 DIAGNOSIS — Z0000 Encounter for general adult medical examination without abnormal findings: Secondary | ICD-10-CM | POA: Diagnosis not present

## 2023-08-15 DIAGNOSIS — E1151 Type 2 diabetes mellitus with diabetic peripheral angiopathy without gangrene: Secondary | ICD-10-CM | POA: Diagnosis not present

## 2023-08-15 DIAGNOSIS — E559 Vitamin D deficiency, unspecified: Secondary | ICD-10-CM | POA: Diagnosis not present

## 2023-08-15 DIAGNOSIS — J302 Other seasonal allergic rhinitis: Secondary | ICD-10-CM | POA: Insufficient documentation

## 2023-08-15 DIAGNOSIS — Z6836 Body mass index (BMI) 36.0-36.9, adult: Secondary | ICD-10-CM

## 2023-08-15 DIAGNOSIS — M25561 Pain in right knee: Secondary | ICD-10-CM | POA: Diagnosis not present

## 2023-08-15 DIAGNOSIS — H6123 Impacted cerumen, bilateral: Secondary | ICD-10-CM | POA: Diagnosis not present

## 2023-08-15 DIAGNOSIS — I119 Hypertensive heart disease without heart failure: Secondary | ICD-10-CM

## 2023-08-15 DIAGNOSIS — M25562 Pain in left knee: Secondary | ICD-10-CM

## 2023-08-15 DIAGNOSIS — Z79899 Other long term (current) drug therapy: Secondary | ICD-10-CM | POA: Diagnosis not present

## 2023-08-15 DIAGNOSIS — E66812 Obesity, class 2: Secondary | ICD-10-CM

## 2023-08-15 DIAGNOSIS — I10 Essential (primary) hypertension: Secondary | ICD-10-CM

## 2023-08-15 LAB — POCT URINALYSIS DIP (CLINITEK)
Bilirubin, UA: NEGATIVE
Glucose, UA: >=1000 mg/dL — AB
Ketones, POC UA: NEGATIVE mg/dL
Leukocytes, UA: NEGATIVE
Nitrite, UA: NEGATIVE
POC PROTEIN,UA: NEGATIVE
Spec Grav, UA: 1.015 (ref 1.010–1.025)
Urobilinogen, UA: 0.2 U/dL
pH, UA: 5.5 (ref 5.0–8.0)

## 2023-08-15 MED ORDER — FLUTICASONE PROPIONATE 50 MCG/ACT NA SUSP: 2.0000 | Freq: Every day | NASAL | 2 refills | Status: AC

## 2023-08-15 MED ORDER — ADULT BLOOD PRESSURE CUFF LG KIT: PACK | 0 refills | Status: AC

## 2023-08-15 NOTE — Assessment & Plan Note (Signed)
 She is encouraged to strive for BMI less than 30 to decrease cardiac risk. Advised to aim for at least 150 minutes of exercise per week. Continue farxiga  and jardiance  as prescribed.

## 2023-08-15 NOTE — Assessment & Plan Note (Signed)
 Stable. Continue atenolol, cozaar, and lipitor as prescribed.

## 2023-08-15 NOTE — Assessment & Plan Note (Signed)
 CT scan negative for gross abnormality.  Continue to monitor for progression

## 2023-08-15 NOTE — Progress Notes (Signed)
 [place], CMA,acting as a Neurosurgeon for Susanna Epley, FNP.,have documented all relevant documentation on the behalf of Susanna Epley, FNP,as directed by  Susanna Epley, FNP while in the presence of Susanna Epley, FNP.  Subjective:    Patient ID: Joanna Sandoval , female    DOB: 01/23/1946 , 78 y.o.   MRN: [id number]  Chief Complaint  Patient presents with   Annual Exam    Patient presents today for HM, Patient reports compliance with medication. Patient denies any chest pain, SOB, or headaches. Patient has no concerns today.     HPI  Patient presents for health maintenance visit. Endorse increased allergies, previously controlling pollen from getting in the house and not walking outside when the pollen is high has helped with this, but it has been worst this year.  Has tremors, has had a CT scan for follow up, no gross abnormality found.  She is adjusting to this by using increased safety measures and not driving unless absolute needed.  Uses cane and rolling walker for ambulation for unsteady gait.  She lives alone and is scared of falling, priority is maintaining her independence.  Feels that she has good family support.    Taking medications as prescribed, no incidence of headaches, dizziness, SOB, light headedness.  Wears CPAP at night and feel that this is helping her, she feels a lot better.  Takes her BP once a week at church, would like BP cuff at home.  She sees cardiology (Dr. Glena Landau) every 3 months.    Sees ophthalmologist regularly.  No GI concerns, negative nausea, vomiting, constipation.  Endorse eating a healthier diet, less complex starches, less fried foods.   Has some urinary urgency, frequency and leakage, wears depends. Endorse joint pain and swelling, especially in knees.  Would like to see arthritis specialist.        Past Medical History:  Diagnosis Date   Arthritis    Diabetes mellitus    Hypercholesteremia    Hypertension    Post-menopausal bleeding     Postmenopausal bleeding 08/30/2011   Normal EBX 09/2011.     Family History  Problem Relation Age of Onset   Hypertension Mother    Parkinsonism Father    Hypertension Father    Cancer Father        ?lung   Diabetes Sister      Current Outpatient Medications:    Accu-Chek Softclix Lancets lancets, USE AS DIRECTED, Disp: 200 each, Rfl: 2   albuterol  (VENTOLIN  HFA) 108 (90 Base) MCG/ACT inhaler, Inhale 2 puffs into the lungs every 6 (six) hours as needed., Disp: 18 g, Rfl: 1   atenolol (TENORMIN) 100 MG tablet, Take 100 mg by mouth daily., Disp: , Rfl:    atorvastatin  (LIPITOR) 40 MG tablet, Take 40 mg by mouth daily., Disp: , Rfl:    Blood Glucose Monitoring Suppl (ACCU-CHEK GUIDE) w/Device KIT, USE TO CHECK BLOOD SUGAR TWICE  DAILY AT 10AM AND 5 PM, Disp: 1 kit, Rfl: 1   Blood Pressure Monitoring (ADULT BLOOD PRESSURE CUFF LG) KIT, Take blood pressure daily and as needed, Disp: 1 kit, Rfl: 0   D3-50 50000 units capsule, Take 50,000 Units by mouth once a week., Disp: , Rfl: 0   dapagliflozin  propanediol (FARXIGA ) 10 MG TABS tablet, Take 1 tablet (10 mg total) by mouth daily before breakfast., Disp: 100 tablet, Rfl: 2   diclofenac  sodium (VOLTAREN ) 1 % GEL, Apply 2 g topically 4 (four) times daily., Disp: 100 g, Rfl:  1   dorzolamide-timolol (COSOPT) 2-0.5 % ophthalmic solution, 1 drop 2 (two) times daily., Disp: , Rfl:    empagliflozin  (JARDIANCE ) 10 MG TABS tablet, Take 1 tablet (10 mg total) by mouth daily before breakfast., Disp: 30 tablet, Rfl: 2   fluticasone  (FLONASE ) 50 MCG/ACT nasal spray, Place 2 sprays into both nostrils daily., Disp: 16 g, Rfl: 2   glimepiride  (AMARYL ) 2 MG tablet, TAKE 1 TABLET BY MOUTH IN THE  MORNING AND AT BEDTIME, Disp: 200 tablet, Rfl: 2   glucose blood (ACCU-CHEK GUIDE) test strip, USE AS DIRECTED, Disp: 200 strip, Rfl: 2   [name] KIT, Use to test blood sugar twice daily as directed. E11.65, Disp: 1 kit, Rfl: 12    latanoprost (XALATAN) 0.005 % ophthalmic solution, 1 drop at bedtime., Disp: , Rfl:    losartan (COZAAR) 50 MG tablet, Take 50 mg by mouth daily., Disp: , Rfl:    No Known Allergies    . The patient's tobacco use is:  Social History   Tobacco Use  Smoking Status Former   Types: Cigarettes  Smokeless Tobacco Never  . She has been exposed to passive smoke. The patient's alcohol use is:  Social History   Substance and Sexual Activity  Alcohol Use No    Review of Systems  Constitutional:  Negative for chills, fatigue and fever.  HENT:  Positive for congestion.   Respiratory:  Negative for cough and shortness of breath.   Cardiovascular:  Negative for chest pain and palpitations.  Genitourinary:  Positive for urgency.  Musculoskeletal:  Positive for arthralgias (bilateral knees), gait problem (uses cane and roling walker) and joint swelling.  Neurological:  Positive for tremors. Negative for light-headedness, numbness and headaches.     Today's Vitals   08/15/23 0853  BP: 120/80  Pulse: 71  Temp: 98.4 F (36.9 C)  TempSrc: Oral  Weight: 196 lb 3.2 oz (89 kg)  Height: 5\' 2"  (1.575 m)  PainSc: 3   PainLoc: Shoulder   Body mass index is 35.89 kg/m.  Wt Readings from Last 3 Encounters:  08/15/23 196 lb 3.2 oz (89 kg)  08/07/23 197 lb (89.4 kg)  04/26/23 196 lb 12.8 oz (89.3 kg)     Objective:  Physical Exam Constitutional:      General: She is not in acute distress.    Appearance: Normal appearance. She is obese.  HENT:     Head: Normocephalic and atraumatic.     Right Ear: Tympanic membrane and external ear normal. There is impacted cerumen.     Left Ear: Tympanic membrane and external ear normal. There is impacted cerumen.     Mouth/Throat:     Mouth: Mucous membranes are moist.     Pharynx: Oropharynx is clear. No oropharyngeal exudate or posterior oropharyngeal erythema.  Eyes:     Extraocular Movements: Extraocular movements intact.     Conjunctiva/sclera:  Conjunctivae normal.     Pupils: Pupils are equal, round, and reactive to light.  Neck:     Vascular: No carotid bruit.  Cardiovascular:     Rate and Rhythm: Normal rate and regular rhythm.     Pulses: Normal pulses.     Heart sounds: Normal heart sounds.  Pulmonary:     Effort: Pulmonary effort is normal.     Breath sounds: Normal breath sounds.  Abdominal:     General: Bowel sounds are normal.     Palpations: Abdomen is soft. There is no mass.  Tenderness: There is no abdominal tenderness.     Hernia: No hernia is present.  Musculoskeletal:        General: Swelling (bilateral kness) and tenderness present.     Cervical back: Normal range of motion and neck supple. No tenderness.  Lymphadenopathy:     Cervical: No cervical adenopathy.  Skin:    General: Skin is warm and dry.     Capillary Refill: Capillary refill takes less than 2 seconds.  Neurological:     General: No focal deficit present.     Mental Status: She is alert and oriented to person, place, and time.     Gait: Gait abnormal.  Psychiatric:        Mood and Affect: Mood normal.        Behavior: Behavior normal.        Thought Content: Thought content normal.        Judgment: Judgment normal.        Title   Diabetic Foot Exam - detailed Is there a history of foot ulcer?: No Is there a foot ulcer now?: No Is there swelling?: No Is there elevated skin temperature?: No Is there abnormal foot shape?: No Is there a claw toe deformity?: No Are the toenails long?: No Are the toenails thick?: No Are the toenails ingrown?: No Is the skin thin, fragile, shiny and hairless?": No Normal Range of Motion?: Yes Is there foot or ankle muscle weakness?: No Do you have pain in calf while walking?: No Are the shoes appropriate in style and fit?: Yes Can the patient see the bottom of their feet?: Yes Right Posterior Tibialis: Present Left posterior Tibialis: Present   Right Dorsalis Pedis: Present Left Dorsalis Pedis:  Present     Semmes-Weinstein Monofilament Test "+" means "has sensation" and "-" means "no sensation"   R Site 1-Great Toe: Pos L Site 1-Great Toe: Pos   R Site 4: Pos L Site 4: Pos   R Site 6: Pos L Site 6: Pos     Image components are not supported.   Image components are not supported. Image components are not supported.  Tuning Fork Comments Diminished sensation bilaterally        Assessment And Plan:     Encounter for annual health examination Assessment & Plan: Behavior modifications discussed and diet history reviewed.   Pt will continue to exercise regularly and modify diet with low GI, plant based foods and decrease intake of processed foods.  Recommend intake of daily multivitamin, Vitamin D , and calcium .  Recommend mammogram for preventive screenings, as well as recommend immunizations that include influenza, TDAP, and Shingles    Type 2 diabetes mellitus with diabetic peripheral angiopathy without gangrene, without long-term current use of insulin (HCC) -     EKG 12-Lead -     POCT URINALYSIS DIP (CLINITEK) -     Microalbumin / creatinine urine ratio -     CMP14+EGFR -     Hemoglobin A1c -     Lipid panel  Hypertensive heart disease without heart failure Assessment & Plan: Blood pressure well controlled, repeat has improvement.  Continue current medications.  Orders: -     CMP14+EGFR -     Adult Blood Pressure Cuff Lg; Take blood pressure daily and as needed  Dispense: 1 kit; Refill: 0  Vitamin D  deficiency Assessment & Plan: Will check vitamin D  level and supplement as needed.    Also encouraged to spend 15 minutes in the sun daily.  COVID-19 vaccination declined Assessment & Plan: Declines covid 19 vaccine. Discussed Sandoval of covid 42 and if she changes her mind about the vaccine to call the office. Education has been provided regarding the importance of this vaccine but patient still declined. Advised may receive this vaccine at local pharmacy  or Health Dept.or vaccine clinic. Aware to provide a copy of the vaccination record if obtained from local pharmacy or Health Dept.  Encouraged to take multivitamin, vitamin d , vitamin c and zinc to increase immune system. Aware can call office if would like to have vaccine here at office. Verbalized acceptance and understanding.    Chronic pain of both knees Assessment & Plan: Will refer to orthopedics at patient request  Orders: -     Ambulatory referral to Orthopedics  Seasonal allergies Assessment & Plan: Encouraged to take over the counter antihistamine and continue nasal spray  Orders: -     Fluticasone  Propionate; Place 2 sprays into both nostrils daily.  Dispense: 16 g; Refill: 2  Bilateral impacted cerumen Assessment & Plan: Wax is removed by with lavage with elephant ear with 1/2 water and 1/2 peroxide. Instructions for home care to prevent wax buildup are given.   Orders: -     Ear Lavage  Essential hypertension Assessment & Plan: Stable. Continue atenolol, cozaar, and lipitor as prescribed.   Tremor of both hands Assessment & Plan: CT scan negative for gross abnormality.  Continue to monitor for progression   Class 2 severe obesity due to excess calories with serious comorbidity and body mass index (BMI) of 36.0 to 36.9 in adult [facility]) Assessment & Plan: She is encouraged to strive for BMI less than 30 to decrease cardiac Sandoval. Advised to aim for at least 150 minutes of exercise per week. Continue farxiga  and jardiance  as prescribed.     Encounter for long-term (current) use of medications -     CBC with Differential/Platelet     Return for controlled DM check 4 months. Patient was given opportunity to ask questions. Patient verbalized understanding of the plan and was able to repeat key elements of the plan. All questions were answered to their satisfaction.   Susanna Epley, FNP  I, Susanna Epley, FNP, have reviewed all documentation for this visit. The  documentation on 08/15/23 for the exam, diagnosis, procedures, and orders are all accurate and complete.

## 2023-08-16 LAB — CBC WITH DIFFERENTIAL/PLATELET
Basophils Absolute: 0.2 10*3/uL (ref 0.0–0.2)
Basos: 1 %
EOS (ABSOLUTE): 1.8 10*3/uL — ABNORMAL HIGH (ref 0.0–0.4)
Eos: 15 %
Hematocrit: 43 % (ref 34.0–46.6)
Hemoglobin: 13.8 g/dL (ref 11.1–15.9)
Immature Grans (Abs): 0.1 10*3/uL (ref 0.0–0.1)
Immature Granulocytes: 1 %
Lymphocytes Absolute: 4.1 10*3/uL — ABNORMAL HIGH (ref 0.7–3.1)
Lymphs: 34 %
MCH: 28.3 pg (ref 26.6–33.0)
MCHC: 32.1 g/dL (ref 31.5–35.7)
MCV: 88 fL (ref 79–97)
Monocytes Absolute: 1.4 10*3/uL — ABNORMAL HIGH (ref 0.1–0.9)
Monocytes: 11 %
Neutrophils Absolute: 4.7 10*3/uL (ref 1.4–7.0)
Neutrophils: 38 %
Platelets: 281 10*3/uL (ref 150–450)
RBC: 4.87 x10E6/uL (ref 3.77–5.28)
RDW: 18.3 % — ABNORMAL HIGH (ref 11.7–15.4)
WBC: 12.1 10*3/uL — ABNORMAL HIGH (ref 3.4–10.8)

## 2023-08-16 LAB — CMP14+EGFR
ALT: 12 IU/L (ref 0–32)
AST: 15 IU/L (ref 0–40)
Albumin: 4.4 g/dL (ref 3.8–4.8)
Alkaline Phosphatase: 131 IU/L — ABNORMAL HIGH (ref 44–121)
BUN/Creatinine Ratio: 16 (ref 12–28)
BUN: 15 mg/dL (ref 8–27)
Bilirubin Total: 0.7 mg/dL (ref 0.0–1.2)
CO2: 27 mmol/L (ref 20–29)
Calcium: 9.9 mg/dL (ref 8.7–10.3)
Chloride: 100 mmol/L (ref 96–106)
Creatinine, Ser: 0.94 mg/dL (ref 0.57–1.00)
Globulin, Total: 2.9 g/dL (ref 1.5–4.5)
Glucose: 163 mg/dL — ABNORMAL HIGH (ref 70–99)
Potassium: 4.4 mmol/L (ref 3.5–5.2)
Sodium: 144 mmol/L (ref 134–144)
Total Protein: 7.3 g/dL (ref 6.0–8.5)
eGFR: 63 mL/min/{1.73_m2} (ref >59)

## 2023-08-16 LAB — LIPID PANEL
Chol/HDL Ratio: 3 ratio (ref 0.0–4.4)
Cholesterol, Total: 167 mg/dL (ref 100–199)
HDL: 55 mg/dL (ref >39)
LDL Chol Calc (NIH): 92 mg/dL (ref 0–99)
Triglycerides: 113 mg/dL (ref 0–149)
VLDL Cholesterol Cal: 20 mg/dL (ref 5–40)

## 2023-08-16 LAB — HEMOGLOBIN A1C
Est. average glucose Bld gHb Est-mCnc: 269 mg/dL
Hgb A1c MFr Bld: 11 % — ABNORMAL HIGH (ref 4.8–5.6)

## 2023-08-16 LAB — MICROALBUMIN / CREATININE URINE RATIO
Creatinine, Urine: 72.2 mg/dL
Microalb/Creat Ratio: 11 mg/g{creat} (ref 0–29)
Microalbumin, Urine: 7.9 ug/mL

## 2023-08-22 DIAGNOSIS — G4733 Obstructive sleep apnea (adult) (pediatric): Secondary | ICD-10-CM | POA: Diagnosis not present

## 2023-08-26 ENCOUNTER — Encounter: Payer: Self-pay | Admitting: Nurse Practitioner

## 2023-08-26 DIAGNOSIS — G8929 Other chronic pain: Secondary | ICD-10-CM | POA: Insufficient documentation

## 2023-08-26 DIAGNOSIS — Z0000 Encounter for general adult medical examination without abnormal findings: Secondary | ICD-10-CM | POA: Insufficient documentation

## 2023-08-26 NOTE — Assessment & Plan Note (Signed)
 Blood pressure well controlled, repeat has improvement.  Continue current medications.

## 2023-08-26 NOTE — Assessment & Plan Note (Signed)
 Will check vitamin D level and supplement as needed.    Also encouraged to spend 15 minutes in the sun daily.

## 2023-08-26 NOTE — Assessment & Plan Note (Signed)
 Wax is removed by with lavage with elephant ear with 1/2 water and 1/2 peroxide. Instructions for home care to prevent wax buildup are given.

## 2023-08-26 NOTE — Assessment & Plan Note (Signed)
 Encouraged to take over the counter antihistamine and continue nasal spray

## 2023-08-26 NOTE — Assessment & Plan Note (Signed)

## 2023-08-26 NOTE — Assessment & Plan Note (Signed)
Behavior modifications discussed and diet history reviewed.   Pt will continue to exercise regularly and modify diet with low GI, plant based foods and decrease intake of processed foods.  Recommend intake of daily multivitamin, Vitamin D, and calcium.  Recommend mammogram for preventive screenings, as well as recommend immunizations that include influenza, TDAP, and Shingles

## 2023-08-26 NOTE — Assessment & Plan Note (Signed)
 Will refer to orthopedics at patient request

## 2023-08-31 ENCOUNTER — Telehealth: Payer: Self-pay

## 2023-08-31 NOTE — Telephone Encounter (Signed)
 Copied from CRM [id number]. Topic: Clinical - Lab/Test Results >> Aug 31, 2023  8:56 AM Joanna Sandoval T wrote: Reason for CRM: please f/u with patient about her lab results done on 4/30. She also wants to speak with someone about the COVID-19 vaccination declined as she says she would never decline the shot. Please f/u with patient  Spoke with patient she was reminded she was having some cold symptoms so she could not get a covid vaccine.

## 2023-09-03 ENCOUNTER — Other Ambulatory Visit: Payer: Self-pay | Admitting: Nurse Practitioner

## 2023-09-03 DIAGNOSIS — E119 Type 2 diabetes mellitus without complications: Secondary | ICD-10-CM

## 2023-09-04 ENCOUNTER — Other Ambulatory Visit: Payer: Self-pay | Admitting: Nurse Practitioner

## 2023-09-04 DIAGNOSIS — E1151 Type 2 diabetes mellitus with diabetic peripheral angiopathy without gangrene: Secondary | ICD-10-CM

## 2023-09-04 DIAGNOSIS — I119 Hypertensive heart disease without heart failure: Secondary | ICD-10-CM

## 2023-09-04 DIAGNOSIS — I739 Peripheral vascular disease, unspecified: Secondary | ICD-10-CM

## 2023-09-06 ENCOUNTER — Telehealth: Payer: Self-pay | Admitting: *Deleted

## 2023-09-06 NOTE — Progress Notes (Signed)
 Complex Care Management Note  Care Guide Note 09/06/2023 Name: Joanna Sandoval MRN: [id number] DOB: [birth year]  Joanna Sandoval is a 78 y.o. year old female who sees Susanna Epley, FNP for primary care. I reached out to Loman Risk by phone today to offer complex care management services.  Ms. Pytel was given information about Complex Care Management services today including:   The Complex Care Management services include support from the care team which includes your Nurse Care Manager, Clinical Social Worker, or Pharmacist.  The Complex Care Management team is here to help remove barriers to the health concerns and goals most important to you. Complex Care Management services are voluntary, and the patient may decline or stop services at any time by request to their care team member.   Complex Care Management Consent Status: Patient agreed to services and verbal consent obtained.   Follow up plan:  Telephone appointment with complex care management team member scheduled for:  10/02/23  Encounter Outcome:  Patient Scheduled  Barnie Bora  [facility]  [facility], [facility] Guide  Direct Dial: [phone]  Fax [phone]

## 2023-09-09 ENCOUNTER — Other Ambulatory Visit: Payer: Self-pay | Admitting: Nurse Practitioner

## 2023-09-09 DIAGNOSIS — E1151 Type 2 diabetes mellitus with diabetic peripheral angiopathy without gangrene: Secondary | ICD-10-CM

## 2023-09-12 ENCOUNTER — Other Ambulatory Visit: Payer: Self-pay | Admitting: Nurse Practitioner

## 2023-09-19 ENCOUNTER — Ambulatory Visit: Payer: Medicare Other

## 2023-09-19 VITALS — BP 120/80 | HR 88 | Temp 99.0°F | Ht 62.0 in | Wt 198.0 lb

## 2023-09-19 DIAGNOSIS — Z0000 Encounter for general adult medical examination without abnormal findings: Secondary | ICD-10-CM

## 2023-09-19 NOTE — Patient Instructions (Signed)
 Joanna Sandoval , Thank you for taking time out of your busy schedule to complete your Annual Wellness Visit with me. I enjoyed our conversation and look forward to speaking with you again next year. I, as well as your care team,  appreciate your ongoing commitment to your health goals. Please review the following plan we discussed and let me know if I can assist you in the future. Your Game plan/ To Do List    Referrals: If you haven't heard from the office you've been referred to, please reach out to them at the phone provided.  N/a Follow up Visits: Next Medicare AWV with our clinical staff: office will schedule   Have you seen your provider in the last 6 months (3 months if uncontrolled diabetes)? Yes Next Office Visit with your provider: 12/18/2023 at 8:20  Clinician Recommendations:  Aim for 30 minutes of exercise or brisk walking, 6-8 glasses of water, and 5 servings of fruits and vegetables each day.       This is a list of the screening recommended for you and due dates:  Health Maintenance  Topic Date Due   COVID-19 Vaccine (6 - 2024-25 season) 12/17/2022   Flu Shot  11/16/2023   Hemoglobin A1C  02/14/2024   Eye exam for diabetics  03/21/2024   Yearly kidney function blood test for diabetes  08/14/2024   Yearly kidney health urinalysis for diabetes  08/14/2024   Complete foot exam   08/14/2024   Medicare Annual Wellness Visit  09/18/2024   DTaP/Tdap/Td vaccine (2 - Td or Tdap) 10/05/2028   Pneumonia Vaccine  Completed   DEXA scan (bone density measurement)  Completed   Hepatitis C Screening  Completed   Zoster (Shingles) Vaccine  Completed   HPV Vaccine  Aged Out   Meningitis B Vaccine  Aged Out   Colon Cancer Screening  Discontinued    Advanced directives: (Declined) Advance directive discussed with you today. Even though you declined this today, please call our office should you change your mind, and we can give you the proper paperwork for you to fill out. Advance Care  Planning is important because it:  [x]  Makes sure you receive the medical care that is consistent with your values, goals, and preferences  [x]  It provides guidance to your family and loved ones and reduces their decisional burden about whether or not they are making the right decisions based on your wishes.  Follow the link provided in your after visit summary or read over the paperwork we have mailed to you to help you started getting your Advance Directives in place. If you need assistance in completing these, please reach out to us  so that we can help you!  See attachments for Preventive Care and Fall Prevention Tips.

## 2023-09-19 NOTE — Progress Notes (Addendum)
 Subjective:   Joanna Sandoval is a 78 y.o. who presents for a Medicare Wellness preventive visit.  As a reminder, Annual Wellness Visits don't include a physical exam, and some assessments may be limited, especially if this visit is performed virtually. We may recommend an in-person follow-up visit with your provider if needed.  Visit Complete: In person    Persons Participating in Visit: Patient.  AWV Questionnaire: No: Patient Medicare AWV questionnaire was not completed prior to this visit.  Cardiac Risk Factors include: advanced age (>7men, >18 women);diabetes mellitus;hypertension     Objective:     Today's Vitals   09/19/23 1118 09/19/23 1147  BP: (!) 140/80 120/80  Pulse: 88   Temp: 99 F (37.2 C)   TempSrc: Oral   SpO2: 93%   Weight: 198 lb (89.8 kg)   Height: 5\' 2"  (1.575 m)    Body mass index is 36.21 kg/m.     09/19/2023   11:28 AM 11/21/2022    2:33 PM 09/14/2022    8:27 AM 11/30/2021    8:54 AM 09/08/2021    8:25 AM 08/18/2020    9:22 AM 10/17/2019    8:26 AM  Advanced Directives  Does Patient Have a Medical Advance Directive? No No No No No No No  Would patient like information on creating a medical advance directive?  No - Patient declined   No - Patient declined  No - Patient declined    Current Medications (verified) Outpatient Encounter Medications as of 09/19/2023  Medication Sig   Accu-Chek Softclix Lancets lancets USE AS DIRECTED   albuterol  (VENTOLIN  HFA) 108 (90 Base) MCG/ACT inhaler Inhale 2 puffs into the lungs every 6 (six) hours as needed.   atenolol (TENORMIN) 100 MG tablet Take 100 mg by mouth daily.   atorvastatin  (LIPITOR) 40 MG tablet Take 40 mg by mouth daily.   Blood Glucose Monitoring Suppl (ACCU-CHEK GUIDE) w/Device KIT USE TO CHECK BLOOD SUGAR TWICE  DAILY AT 10AM AND 5 PM   Blood Pressure Monitoring (ADULT BLOOD PRESSURE CUFF LG) KIT Take blood pressure daily and as needed   D3-50 50000 units capsule Take 50,000 Units by mouth  once a week.   dorzolamide-timolol (COSOPT) 2-0.5 % ophthalmic solution 1 drop 2 (two) times daily.   FARXIGA  10 MG TABS tablet TAKE 1 TABLET BY MOUTH DAILY  BEFORE BREAKFAST   fluticasone  (FLONASE ) 50 MCG/ACT nasal spray Place 2 sprays into both nostrils daily.   glimepiride  (AMARYL ) 2 MG tablet TAKE 1 TABLET BY MOUTH IN THE  MORNING AND AT BEDTIME   glucose blood (ACCU-CHEK GUIDE) test strip USE AS DIRECTED   JARDIANCE  10 MG TABS tablet TAKE 1 TABLET(10 MG) BY MOUTH DAILY BEFORE BREAKFAST   [name] KIT Use to test blood sugar twice daily as directed. E11.65   latanoprost (XALATAN) 0.005 % ophthalmic solution 1 drop at bedtime.   losartan (COZAAR) 50 MG tablet Take 50 mg by mouth daily.   diclofenac  sodium (VOLTAREN ) 1 % GEL Apply 2 g topically 4 (four) times daily. (Patient not taking: Reported on 09/19/2023)   No facility-administered encounter medications on file as of 09/19/2023.    Allergies (verified) Patient has no known allergies.   History: Past Medical History:  Diagnosis Date   Arthritis    Diabetes mellitus    Hypercholesteremia    Hypertension    Post-menopausal bleeding    Postmenopausal bleeding 08/30/2011   Normal EBX 09/2011.   Past Surgical History:  Procedure Laterality Date   CATARACT EXTRACTION     COLONOSCOPY     DILATION AND CURETTAGE OF UTERUS     HYSTEROSCOPY WITH D & C N/A 05/02/2017   Procedure: DILATATION AND CURETTAGE /HYSTEROSCOPY;  Surgeon: Verlyn Goad, MD;  Location: [facility];  Service: Gynecology;  Laterality: N/A;   KNEE ARTHROSCOPY     Family History  Problem Relation Age of Onset   Hypertension Mother    Parkinsonism Father    Hypertension Father    Cancer Father        ?lung   Diabetes Sister    Social History   Socioeconomic History   Marital status: Divorced    Spouse name: Not on file   Number of children: Not on file   Years of education: Not on file   Highest education  level: Not on file  Occupational History   Occupation: retired Lawyer  Tobacco Use   Smoking status: Former    Types: Cigarettes   Smokeless tobacco: Never  Vaping Use   Vaping status: Never Used  Substance and Sexual Activity   Alcohol use: No   Drug use: No   Sexual activity: Not Currently    Birth control/protection: Post-menopausal  Other Topics Concern   Not on file  Social History Narrative   Not on file   Social Drivers of Health   Financial Resource Strain: Low Risk  (09/19/2023)   Overall Financial Resource Strain (CARDIA)    Difficulty of Paying Living Expenses: Not hard at all  Food Insecurity: No Food Insecurity (09/19/2023)   Hunger Vital Sign    Worried About Running Out of Food in the Last Year: Never true    Ran Out of Food in the Last Year: Never true  Transportation Needs: No Transportation Needs (09/19/2023)   PRAPARE - Administrator, Civil Service (Medical): No    Lack of Transportation (Non-Medical): No  Physical Activity: Inactive (09/19/2023)   Exercise Vital Sign    Days of Exercise per Week: 0 days    Minutes of Exercise per Session: 0 min  Stress: Stress Concern Present (09/19/2023)   [employer] of Occupational Health - Occupational Stress Questionnaire    Feeling of Stress : To some extent  Social Connections: Moderately Isolated (09/19/2023)   Social Connection and Isolation Panel [NHANES]    Frequency of Communication with Friends and Family: More than three times a week    Frequency of Social Gatherings with Friends and Family: Once a week    Attends Religious Services: More than 4 times per year    Active Member of [employer] or Organizations: No    Attends Engineer, structural: Never    Marital Status: Divorced    Tobacco Counseling Counseling given: Not Answered    Clinical Intake:  Pre-visit preparation completed: Yes  Pain : No/denies pain     Nutritional Status: BMI > 30  Obese Nutritional Risks:  None Diabetes: Yes CBG done?: No Did pt. bring in CBG monitor from home?: No  Lab Results  Component Value Date   HGBA1C 11.0 (H) 08/15/2023   HGBA1C 10.0 (H) 04/26/2023   HGBA1C 7.2 (H) 11/28/2022     How often do you need to have someone help you when you read instructions, pamphlets, or other written materials from your doctor or pharmacy?: 1 - Never  Interpreter Needed?: No  Information entered by :: NAllen LPN   Activities of Daily Living  09/19/2023   11:20 AM  In your present state of health, do you have any difficulty performing the following activities:  Hearing? 1  Comment has a hearing aid  Vision? 0  Difficulty concentrating or making decisions? 1  Comment trouble with memory and decisions  Walking or climbing stairs? 1  Dressing or bathing? 0  Doing errands, shopping? 0  Preparing Food and eating ? N  Using the Toilet? N  In the past six months, have you accidently leaked urine? Y  Do you have problems with loss of bowel control? N  Managing your Medications? N  Managing your Finances? N  Housekeeping or managing your Housekeeping? N    Patient Care Team: Susanna Epley, FNP as PCP - General (General Practice) [facility] Lewis, Jasmine D, LCSW as Child psychotherapist (Licensed Clinical Social Worker) Little, Skeeter Dukes, RN as VBCI Care Management Little, Skeeter Dukes, RN  I have updated your Care Teams any recent Medical Services you may have received from other providers in the past year.     Assessment:    This is a routine wellness examination for [name].  Hearing/Vision screen Hearing Screening - Comments:: Has hearing aid that is maintained Vision Screening - Comments:: Regular eye exams, [facility]   Goals Addressed             This Visit's Progress    Patient Stated       09/19/2023, wants to remain independent and walk without cane       Depression Screen     09/19/2023   11:31 AM 08/15/2023    9:15 AM 04/26/2023     9:53 AM 11/28/2022    9:40 AM 09/14/2022    8:29 AM 08/14/2022    9:42 AM 05/22/2022    9:49 AM  PHQ 2/9 Scores  PHQ - 2 Score 4 2 0 0 1 0 0  PHQ- 9 Score 4 13 1 4   0 0    Fall Risk     09/19/2023   11:30 AM 08/15/2023    9:15 AM 04/26/2023    9:53 AM 11/28/2022    9:40 AM 09/14/2022    8:29 AM  Fall Risk   Falls in the past year? 0 0 0 0 0  Number falls in past yr: 0 0 0 0 0  Injury with Fall? 0 0 0 0 0  Risk for fall due to : Medication side effect No Fall Risks No Fall Risks No Fall Risks Impaired mobility;Impaired balance/gait;Medication side effect  Follow up Falls evaluation completed;Falls prevention discussed Falls evaluation completed Falls evaluation completed Falls evaluation completed Falls prevention discussed    MEDICARE RISK AT HOME:  Medicare Risk at Home Any stairs in or around the home?: No If so, are there any without handrails?: No Home free of loose throw rugs in walkways, pet beds, electrical cords, etc?: Yes Adequate lighting in your home to reduce risk of falls?: Yes Life alert?: No Use of a cane, walker or w/c?: Yes Grab bars in the bathroom?: Yes Shower chair or bench in shower?: Yes Elevated toilet seat or a handicapped toilet?: No  TIMED UP AND GO:  Was the test performed?  Yes  Length of time to ambulate 10 feet: 7 sec Gait slow and steady without use of assistive device  Cognitive Function: 6CIT completed        09/19/2023   11:34 AM 09/14/2022    8:33 AM 09/08/2021    8:30 AM  08/18/2020    9:28 AM 07/30/2019    9:06 AM  6CIT Screen  What Year? 0 points 0 points 0 points 0 points 0 points  What month? 0 points 0 points 0 points 0 points 0 points  What time? 0 points 0 points 3 points 3 points 3 points  Count back from 20 0 points 2 points 0 points 4 points 0 points  Months in reverse 0 points 0 points 4 points 2 points 4 points  Repeat phrase 0 points 4 points 4 points 4 points 4 points  Total Score 0 points 6 points 11 points 13 points 11 points     Immunizations Immunization History  Administered Date(s) Administered   Fluad Quad(high Dose 65+) 01/29/2020, 02/01/2021, 01/19/2022   Fluad Trivalent(High Dose 65+) 04/26/2023   Influenza, High Dose Seasonal PF 01/16/2018   Influenza,inj,Quad PF,6+ Mos 01/12/2019   PFIZER Comirnaty(Gray Top)Covid-19 Tri-Sucrose Vaccine 10/10/2021   PFIZER(Purple Top)SARS-COV-2 Vaccination 05/25/2019, 06/18/2019, 02/14/2020, 09/18/2020   PNEUMOCOCCAL CONJUGATE-20 08/09/2021   Pneumococcal Polysaccharide-23 08/04/2020   RSV,unspecified 04/15/2022   Tdap 10/06/2018   Zoster Recombinant(Shingrix ) 02/08/2021, 04/23/2021    Screening Tests Health Maintenance  Topic Date Due   COVID-19 Vaccine (6 - 2024-25 season) 12/17/2022   INFLUENZA VACCINE  11/16/2023   HEMOGLOBIN A1C  02/14/2024   OPHTHALMOLOGY EXAM  03/21/2024   Diabetic kidney evaluation - eGFR measurement  08/14/2024   Diabetic kidney evaluation - Urine ACR  08/14/2024   FOOT EXAM  08/14/2024   Medicare Annual Wellness (AWV)  09/18/2024   DTaP/Tdap/Td (2 - Td or Tdap) 10/05/2028   Pneumonia Vaccine 67+ Years old  Completed   DEXA SCAN  Completed   Hepatitis C Screening  Completed   Zoster Vaccines- Shingrix   Completed   HPV VACCINES  Aged Out   Meningococcal B Vaccine  Aged Out   Colonoscopy  Discontinued    Health Maintenance  Health Maintenance Due  Topic Date Due   COVID-19 Vaccine (6 - 2024-25 season) 12/17/2022   Health Maintenance Items Addressed: Due for covid vaccine.   Additional Screening:  Vision Screening: Recommended annual ophthalmology exams for early detection of glaucoma and other disorders of the eye. Would you like a referral to an eye doctor? No    Dental Screening: Recommended annual dental exams for proper oral hygiene  Community Resource Referral / Chronic Care Management: CRR required this visit?  No   CCM required this visit?  No   Plan:    I have personally reviewed and noted the  following in the patient's chart:   Medical and social history Use of alcohol, tobacco or illicit drugs  Current medications and supplements including opioid prescriptions. Patient is not currently taking opioid prescriptions. Functional ability and status Nutritional status Physical activity Advanced directives List of other physicians Hospitalizations, surgeries, and ER visits in previous 12 months Vitals Screenings to include cognitive, depression, and falls Referrals and appointments  In addition, I have reviewed and discussed with patient certain preventive protocols, quality metrics, and best practice recommendations. A written personalized care plan for preventive services as well as general preventive health recommendations were provided to patient.   Areatha Beecham, LPN   [id number]   After Visit Summary: (In Person-Printed) AVS printed and given to the patient  Notes: Nothing significant to report at this time.

## 2023-09-22 DIAGNOSIS — G4733 Obstructive sleep apnea (adult) (pediatric): Secondary | ICD-10-CM | POA: Diagnosis not present

## 2023-09-24 ENCOUNTER — Inpatient Hospital Stay (HOSPITAL_COMMUNITY)
Admission: EM | Admit: 2023-09-24 | Discharge: 2023-09-28 | DRG: 871 | Disposition: A | Discharge status: Home Health | Attending: Student | Admitting: Student

## 2023-09-24 ENCOUNTER — Other Ambulatory Visit: Payer: Self-pay

## 2023-09-24 ENCOUNTER — Emergency Department (HOSPITAL_COMMUNITY)

## 2023-09-24 ENCOUNTER — Encounter (HOSPITAL_COMMUNITY): Payer: Self-pay

## 2023-09-24 DIAGNOSIS — E7800 Pure hypercholesterolemia, unspecified: Secondary | ICD-10-CM | POA: Diagnosis not present

## 2023-09-24 DIAGNOSIS — Z8249 Family history of ischemic heart disease and other diseases of the circulatory system: Secondary | ICD-10-CM

## 2023-09-24 DIAGNOSIS — B349 Viral infection, unspecified: Secondary | ICD-10-CM | POA: Diagnosis present

## 2023-09-24 DIAGNOSIS — R509 Fever, unspecified: Secondary | ICD-10-CM

## 2023-09-24 DIAGNOSIS — Z79899 Other long term (current) drug therapy: Secondary | ICD-10-CM | POA: Diagnosis not present

## 2023-09-24 DIAGNOSIS — Z87891 Personal history of nicotine dependence: Secondary | ICD-10-CM

## 2023-09-24 DIAGNOSIS — G4733 Obstructive sleep apnea (adult) (pediatric): Secondary | ICD-10-CM | POA: Diagnosis not present

## 2023-09-24 DIAGNOSIS — R059 Cough, unspecified: Secondary | ICD-10-CM | POA: Diagnosis not present

## 2023-09-24 DIAGNOSIS — B348 Other viral infections of unspecified site: Secondary | ICD-10-CM | POA: Diagnosis not present

## 2023-09-24 DIAGNOSIS — J9601 Acute respiratory failure with hypoxia: Secondary | ICD-10-CM | POA: Diagnosis not present

## 2023-09-24 DIAGNOSIS — Z7984 Long term (current) use of oral hypoglycemic drugs: Secondary | ICD-10-CM | POA: Diagnosis not present

## 2023-09-24 DIAGNOSIS — Z833 Family history of diabetes mellitus: Secondary | ICD-10-CM | POA: Diagnosis not present

## 2023-09-24 DIAGNOSIS — D72829 Elevated white blood cell count, unspecified: Secondary | ICD-10-CM | POA: Diagnosis not present

## 2023-09-24 DIAGNOSIS — R440 Auditory hallucinations: Secondary | ICD-10-CM

## 2023-09-24 DIAGNOSIS — J449 Chronic obstructive pulmonary disease, unspecified: Secondary | ICD-10-CM | POA: Diagnosis not present

## 2023-09-24 DIAGNOSIS — R0902 Hypoxemia: Secondary | ICD-10-CM | POA: Diagnosis not present

## 2023-09-24 DIAGNOSIS — E1165 Type 2 diabetes mellitus with hyperglycemia: Secondary | ICD-10-CM | POA: Diagnosis not present

## 2023-09-24 DIAGNOSIS — E119 Type 2 diabetes mellitus without complications: Secondary | ICD-10-CM

## 2023-09-24 DIAGNOSIS — E1151 Type 2 diabetes mellitus with diabetic peripheral angiopathy without gangrene: Secondary | ICD-10-CM | POA: Diagnosis present

## 2023-09-24 DIAGNOSIS — I10 Essential (primary) hypertension: Secondary | ICD-10-CM | POA: Diagnosis not present

## 2023-09-24 DIAGNOSIS — Z85038 Personal history of other malignant neoplasm of large intestine: Secondary | ICD-10-CM | POA: Diagnosis not present

## 2023-09-24 DIAGNOSIS — A419 Sepsis, unspecified organism: Principal | ICD-10-CM | POA: Diagnosis present

## 2023-09-24 LAB — CBC
HCT: 43.6 % (ref 36.0–46.0)
HCT: 42 % (ref 36.0–46.0)
Hemoglobin: 13.7 g/dL (ref 12.0–15.0)
Hemoglobin: 12.8 g/dL (ref 12.0–15.0)
MCH: 28.4 pg (ref 26.0–34.0)
MCH: 28.3 pg (ref 26.0–34.0)
MCHC: 31.4 g/dL (ref 30.0–36.0)
MCHC: 30.5 g/dL (ref 30.0–36.0)
MCV: 90.5 fL (ref 80.0–100.0)
MCV: 92.9 fL (ref 80.0–100.0)
Platelets: 257 10*3/uL (ref 150–400)
Platelets: 229 10*3/uL (ref 150–400)
RBC: 4.82 MIL/uL (ref 3.87–5.11)
RBC: 4.52 MIL/uL (ref 3.87–5.11)
RDW: 19.5 % — ABNORMAL HIGH (ref 11.5–15.5)
RDW: 19.4 % — ABNORMAL HIGH (ref 11.5–15.5)
WBC: 12.5 10*3/uL — ABNORMAL HIGH (ref 4.0–10.5)
WBC: 11.6 10*3/uL — ABNORMAL HIGH (ref 4.0–10.5)
nRBC: 0 % (ref 0.0–0.2)
nRBC: 0 % (ref 0.0–0.2)

## 2023-09-24 LAB — RESPIRATORY PANEL BY PCR

## 2023-09-24 LAB — LIPASE, BLOOD: Lipase: 27 U/L (ref 11–51)

## 2023-09-24 LAB — COMPREHENSIVE METABOLIC PANEL WITH GFR
ALT: 14 U/L (ref 0–44)
AST: 23 U/L (ref 15–41)
Albumin: 3.6 g/dL (ref 3.5–5.0)
Alkaline Phosphatase: 99 U/L (ref 38–126)
Anion gap: 9 (ref 5–15)
BUN: 16 mg/dL (ref 8–23)
CO2: 27 mmol/L (ref 22–32)
Calcium: 8.8 mg/dL — ABNORMAL LOW (ref 8.9–10.3)
Chloride: 102 mmol/L (ref 98–111)
Creatinine, Ser: 1.04 mg/dL — ABNORMAL HIGH (ref 0.44–1.00)
GFR, Estimated: 56 mL/min — ABNORMAL LOW (ref >60)
Glucose, Bld: 257 mg/dL — ABNORMAL HIGH (ref 70–99)
Potassium: 4.3 mmol/L (ref 3.5–5.1)
Sodium: 138 mmol/L (ref 135–145)
Total Bilirubin: 1.1 mg/dL (ref 0.0–1.2)
Total Protein: 7.4 g/dL (ref 6.5–8.1)

## 2023-09-24 LAB — RESP PANEL BY RT-PCR (RSV, FLU A&B, COVID)  RVPGX2
Influenza A by PCR: NEGATIVE
Influenza B by PCR: NEGATIVE
Resp Syncytial Virus by PCR: NEGATIVE
SARS Coronavirus 2 by RT PCR: NEGATIVE

## 2023-09-24 LAB — I-STAT CG4 LACTIC ACID, ED
Lactic Acid, Venous: 1 mmol/L (ref 0.5–1.9)
Lactic Acid, Venous: 0.9 mmol/L (ref 0.5–1.9)

## 2023-09-24 LAB — BRAIN NATRIURETIC PEPTIDE: B Natriuretic Peptide: 56.7 pg/mL (ref 0.0–100.0)

## 2023-09-24 LAB — CBG MONITORING, ED: Glucose-Capillary: 148 mg/dL — ABNORMAL HIGH (ref 70–99)

## 2023-09-24 LAB — GLUCOSE, CAPILLARY: Glucose-Capillary: 176 mg/dL — ABNORMAL HIGH (ref 70–99)

## 2023-09-24 LAB — HEMOGLOBIN A1C
Hgb A1c MFr Bld: 10.2 % — ABNORMAL HIGH (ref 4.8–5.6)
Mean Plasma Glucose: 246.04 mg/dL

## 2023-09-24 LAB — CREATININE, SERUM
Creatinine, Ser: 1.01 mg/dL — ABNORMAL HIGH (ref 0.44–1.00)
GFR, Estimated: 58 mL/min — ABNORMAL LOW (ref >60)

## 2023-09-24 LAB — PROCALCITONIN: Procalcitonin: <0.1 ng/mL

## 2023-09-24 MED ORDER — LACTATED RINGERS IV BOLUS (SEPSIS): 1000.0000 mL | Freq: Once | INTRAVENOUS | Status: DC

## 2023-09-24 MED ORDER — DORZOLAMIDE HCL-TIMOLOL MAL 2-0.5 % OP SOLN
1.0000 [drp] | Freq: Two times a day (BID) | OPHTHALMIC | Status: DC
  Administered 2023-09-24 – 2023-09-28 (×9): 1 [drp] via OPHTHALMIC
  Filled 2023-09-24: qty 10

## 2023-09-24 MED ORDER — ATENOLOL 25 MG PO TABS
100.0000 mg | ORAL_TABLET | Freq: Every day | ORAL | Status: DC
  Administered 2023-09-24 – 2023-09-28 (×5): 100 mg via ORAL
  Filled 2023-09-24 (×6): qty 4

## 2023-09-24 MED ORDER — ATORVASTATIN CALCIUM 40 MG PO TABS
40.0000 mg | ORAL_TABLET | Freq: Every day | ORAL | Status: DC
  Administered 2023-09-24 – 2023-09-27 (×4): 40 mg via ORAL
  Filled 2023-09-24 (×4): qty 1

## 2023-09-24 MED ORDER — IPRATROPIUM-ALBUTEROL 0.5-2.5 (3) MG/3ML IN SOLN
3.0000 mL | Freq: Once | RESPIRATORY_TRACT | Status: AC
  Administered 2023-09-24: 3 mL via RESPIRATORY_TRACT
  Filled 2023-09-24: qty 3

## 2023-09-24 MED ORDER — LOSARTAN POTASSIUM 50 MG PO TABS
50.0000 mg | ORAL_TABLET | Freq: Every day | ORAL | Status: DC
  Administered 2023-09-24 – 2023-09-28 (×5): 50 mg via ORAL
  Filled 2023-09-24 (×5): qty 1

## 2023-09-24 MED ORDER — LACTATED RINGERS IV BOLUS
1000.0000 mL | Freq: Once | INTRAVENOUS | Status: AC
  Administered 2023-09-24: 1000 mL via INTRAVENOUS

## 2023-09-24 MED ORDER — INSULIN ASPART 100 UNIT/ML IJ SOLN
0.0000 [IU] | Freq: Three times a day (TID) | INTRAMUSCULAR | Status: DC
  Administered 2023-09-24: 3 [IU] via SUBCUTANEOUS
  Administered 2023-09-25: 5 [IU] via SUBCUTANEOUS
  Administered 2023-09-25 – 2023-09-26 (×3): 3 [IU] via SUBCUTANEOUS
  Administered 2023-09-26 (×2): 8 [IU] via SUBCUTANEOUS
  Administered 2023-09-27 (×2): 5 [IU] via SUBCUTANEOUS
  Administered 2023-09-27: 11 [IU] via SUBCUTANEOUS
  Administered 2023-09-28: 2 [IU] via SUBCUTANEOUS
  Administered 2023-09-28: 8 [IU] via SUBCUTANEOUS

## 2023-09-24 MED ORDER — ACETAMINOPHEN 325 MG PO TABS
650.0000 mg | ORAL_TABLET | Freq: Four times a day (QID) | ORAL | Status: DC | PRN
  Administered 2023-09-24 – 2023-09-27 (×6): 650 mg via ORAL
  Filled 2023-09-24 (×6): qty 2

## 2023-09-24 MED ORDER — LACTATED RINGERS IV BOLUS (SEPSIS)
1000.0000 mL | Freq: Once | INTRAVENOUS | Status: DC
  Administered 2023-09-24: 1000 mL via INTRAVENOUS

## 2023-09-24 MED ORDER — LATANOPROST 0.005 % OP SOLN
1.0000 [drp] | Freq: Every day | OPHTHALMIC | Status: DC
  Administered 2023-09-24 – 2023-09-27 (×4): 1 [drp] via OPHTHALMIC
  Filled 2023-09-24: qty 2.5

## 2023-09-24 MED ORDER — ORAL CARE MOUTH RINSE: 15.0000 mL | OROMUCOSAL | Status: DC | PRN

## 2023-09-24 MED ORDER — ENOXAPARIN SODIUM 40 MG/0.4ML IJ SOSY
40.0000 mg | PREFILLED_SYRINGE | INTRAMUSCULAR | Status: DC
  Administered 2023-09-24 – 2023-09-27 (×4): 40 mg via SUBCUTANEOUS
  Filled 2023-09-24 (×4): qty 0.4

## 2023-09-24 MED ORDER — SODIUM CHLORIDE 0.9 % IV SOLN
500.0000 mg | Freq: Once | INTRAVENOUS | Status: AC
  Administered 2023-09-24: 500 mg via INTRAVENOUS
  Filled 2023-09-24: qty 5

## 2023-09-24 MED ORDER — SODIUM CHLORIDE 0.9 % IV SOLN
2.0000 g | Freq: Once | INTRAVENOUS | Status: AC
  Administered 2023-09-24: 2 g via INTRAVENOUS
  Filled 2023-09-24: qty 20

## 2023-09-24 MED ORDER — IPRATROPIUM-ALBUTEROL 0.5-2.5 (3) MG/3ML IN SOLN
3.0000 mL | Freq: Four times a day (QID) | RESPIRATORY_TRACT | Status: DC
  Administered 2023-09-24 – 2023-09-27 (×11): 3 mL via RESPIRATORY_TRACT
  Filled 2023-09-24 (×12): qty 3

## 2023-09-24 MED ORDER — ACETAMINOPHEN 325 MG PO TABS
650.0000 mg | ORAL_TABLET | Freq: Once | ORAL | Status: AC
  Administered 2023-09-24: 650 mg via ORAL
  Filled 2023-09-24: qty 2

## 2023-09-24 MED ORDER — FLUTICASONE PROPIONATE 50 MCG/ACT NA SUSP
2.0000 | Freq: Every day | NASAL | Status: DC
  Administered 2023-09-24 – 2023-09-28 (×5): 2 via NASAL
  Filled 2023-09-24: qty 16

## 2023-09-24 MED ORDER — BENZONATATE 100 MG PO CAPS
100.0000 mg | ORAL_CAPSULE | Freq: Two times a day (BID) | ORAL | Status: DC | PRN
  Administered 2023-09-24 – 2023-09-25 (×2): 100 mg via ORAL
  Filled 2023-09-24 (×2): qty 1

## 2023-09-24 MED ORDER — GLIMEPIRIDE 2 MG PO TABS
2.0000 mg | ORAL_TABLET | Freq: Every day | ORAL | Status: DC
  Administered 2023-09-25 – 2023-09-27 (×3): 2 mg via ORAL
  Filled 2023-09-24 (×3): qty 1

## 2023-09-24 NOTE — Sepsis Progress Note (Signed)
Notified bedside nurse of need to draw blood cultures.  

## 2023-09-24 NOTE — ED Notes (Signed)
 Sats 82% RA placed on o2 at 4 liters /[place] Dr. Aldean Amass aware.

## 2023-09-24 NOTE — Plan of Care (Signed)
   Problem: Coping: Goal: Ability to adjust to condition or change in health will improve Outcome: Progressing

## 2023-09-24 NOTE — H&P (Signed)
 History and Physical    Patient: Joanna Sandoval:[id number] DOB: 1945/07/08 DOA: 09/24/2023 DOS: the patient was seen and examined on 09/24/2023 PCP: Susanna Epley, FNP  Patient coming from: Home  Chief Complaint:  Chief Complaint  Patient presents with   Cough   Hallucinations   HPI: Joanna Sandoval is a 78 y.o. female with medical history significant of HTN, HLD, and DM2 p/w cough for weeks.  Pt states that she has not felt well for weeks. She reports having a single symptom of a cough that has persisted for weeks. She denies headaches, fever, or chills. Of note, she does endorse fatigue that cause her not to attend church yesterday, which is abnormal for her since she goes every Sunday. Additionally, her son was sick last week, but she is unsure if he had a virus or received antibiotics. She reports to the ED for further evaluation given this nagging cough.  In the ED, pt febrile and tachypneic on 2-->4L [place] (RA at baseline). Labs notable for WBC 12.5, lactate 1, and glucose 257, CXR neg for active disease/focal consolidation. S/p IV CTX/azithromycin in ED for presumed CAP. Respiratory panel pending. Pt admitted to medicine with new O2 requirement.  Review of Systems: As mentioned in the history of present illness. All other systems reviewed and are negative. Past Medical History:  Diagnosis Date   Arthritis    Diabetes mellitus    Hypercholesteremia    Hypertension    Post-menopausal bleeding    Postmenopausal bleeding 08/30/2011   Normal EBX 09/2011.   Past Surgical History:  Procedure Laterality Date   CATARACT EXTRACTION     COLONOSCOPY     DILATION AND CURETTAGE OF UTERUS     HYSTEROSCOPY WITH D & C N/A 05/02/2017   Procedure: DILATATION AND CURETTAGE /HYSTEROSCOPY;  Surgeon: Verlyn Goad, MD;  Location:  SURGERY CENTER;  Service: Gynecology;  Laterality: N/A;   KNEE ARTHROSCOPY     Social History:  reports that she has quit smoking. Her smoking use  included cigarettes. She has never used smokeless tobacco. She reports that she does not drink alcohol and does not use drugs.  No Known Allergies  Family History  Problem Relation Age of Onset   Hypertension Mother    Parkinsonism Father    Hypertension Father    Cancer Father        ?lung   Diabetes Sister     Prior to Admission medications   Medication Sig Start Date End Date Taking? Authorizing Provider  albuterol  (VENTOLIN  HFA) 108 (90 Base) MCG/ACT inhaler Inhale 2 puffs into the lungs every 6 (six) hours as needed. 02/12/23  Yes Susanna Epley, FNP  atenolol (TENORMIN) 100 MG tablet Take 100 mg by mouth daily.   Yes [provider]  atorvastatin  (LIPITOR) 40 MG tablet Take 40 mg by mouth at bedtime. 10/11/22  Yes [provider]  D3-50 50000 units capsule Take 50,000 Units by mouth once a week. 01/14/17  Yes [provider]  dorzolamide-timolol (COSOPT) 2-0.5 % ophthalmic solution Place 1 drop into both eyes 2 (two) times daily. 05/10/22  Yes [provider]  fluticasone  (FLONASE ) 50 MCG/ACT nasal spray Place 2 sprays into both nostrils daily. Patient taking differently: Place 2 sprays into both nostrils daily as needed for allergies. 08/15/23 08/14/24 Yes Susanna Epley, FNP  glimepiride  (AMARYL ) 2 MG tablet TAKE 1 TABLET BY MOUTH IN THE  MORNING AND AT BEDTIME 09/04/23  Yes Susanna Epley, FNP  JARDIANCE  10 MG  TABS tablet TAKE 1 TABLET(10 MG) BY MOUTH DAILY BEFORE BREAKFAST 09/11/23  Yes Susanna Epley, FNP  latanoprost (XALATAN) 0.005 % ophthalmic solution Place 1 drop into both eyes at bedtime. 05/16/19  Yes [provider]  losartan (COZAAR) 50 MG tablet Take 50 mg by mouth daily.   Yes [provider]  Accu-Chek Softclix Lancets lancets USE AS DIRECTED 05/17/23   Susanna Epley, FNP  Blood Glucose Monitoring Suppl (ACCU-CHEK GUIDE) w/Device KIT USE TO CHECK BLOOD SUGAR TWICE  DAILY AT 10AM AND 5 PM 02/28/23   Susanna Epley, FNP  Blood  Pressure Monitoring (ADULT BLOOD PRESSURE CUFF LG) KIT Take blood pressure daily and as needed 08/15/23   Susanna Epley, FNP  diclofenac  sodium (VOLTAREN ) 1 % GEL Apply 2 g topically 4 (four) times daily. Patient not taking: Reported on 09/19/2023 10/30/18   Susanna Epley, FNP  FARXIGA  10 MG TABS tablet TAKE 1 TABLET BY MOUTH DAILY  BEFORE BREAKFAST Patient not taking: Reported on 09/24/2023 09/12/23   Susanna Epley, FNP  glucose blood (ACCU-CHEK GUIDE) test strip USE AS DIRECTED 02/28/23   Susanna Epley, FNP  [name] KIT Use to test blood sugar twice daily as directed. E11.65 05/02/19   Susanna Epley, FNP    Physical Exam: Vitals:   09/24/23 1130 09/24/23 1145 09/24/23 1215 09/24/23 1245  BP: 120/70 93/81 (!) 132/55 130/62  Pulse: 73 74 70 72  Resp: 17 18 18  (!) 22  Temp:      TempSrc:      SpO2: 99% 100% 98% 100%  Weight:      Height:       General: Alert, oriented x3, resting comfortably in no acute distress Respiratory: Lungs clear to auscultation bilaterally with normal respiratory effort; no w/r/r Cardiovascular: Regular rate and rhythm w/o m/r/g   Data Reviewed:  Lab Results  Component Value Date   WBC 12.5 (H) 09/24/2023   HGB 13.7 09/24/2023   HCT 43.6 09/24/2023   MCV 90.5 09/24/2023   PLT 257 09/24/2023   Lab Results  Component Value Date   GLUCOSE 257 (H) 09/24/2023   CALCIUM  8.8 (L) 09/24/2023   NA 138 09/24/2023   K 4.3 09/24/2023   CO2 27 09/24/2023   CL 102 09/24/2023   BUN 16 09/24/2023   CREATININE 1.04 (H) 09/24/2023   Lab Results  Component Value Date   ALT 14 09/24/2023   AST 23 09/24/2023   ALKPHOS 99 09/24/2023   BILITOT 1.1 09/24/2023   No results found for: "INR", "PROTIME"  Radiology: [facility] Chest Port 1 View Result Date: 09/24/2023 CLINICAL DATA:  Cough. EXAM: PORTABLE CHEST 1 VIEW COMPARISON:  11/21/2022. FINDINGS: Bilateral lung fields are clear. Bilateral costophrenic angles are clear. Normal  cardio-mediastinal silhouette. No acute osseous abnormalities. The soft tissues are within normal limits. IMPRESSION: No active disease. Electronically Signed   By: Beula Brunswick M.D.   On: 09/24/2023 09:06     Assessment and Plan: 26F h/o HTN, HLD, and DM2 p/w cough for weeks.  AHRF Unclear etiology. CXR neg. WBC 12.5 and lactate 1. Endorses  cough c/w viral etiology. RVP pending. Will need BNP to exclude cardiac etiology. BLE edema neg. High suspicion for viral URI c/b baseline undiagnosed COPD (pt endorses 2pack/year x atleast 5 years, but quit years ago) -Defer further abx for CAP for now -Duonebs q6h sch for now -F/u RVP; if neg, consider CAP abx course vs TTE pending BNP -F/u procalcitonin -F/u BNP  HTN -PTA losartan  50mg  daily and atenolol 100mg  daily  HLD -PTA atorvastatin  40mg  daily  DM2 -Reduced dose glimepiride  2mg  daily (pta BID) -SSI TID AC prn   Advance Care Planning:   Code Status: Full Code   Consults:   Family Communication: N/A  Severity of Illness: The appropriate patient status for this patient is INPATIENT. Inpatient status is judged to be reasonable and necessary in order to provide the required intensity of service to ensure the patient's safety. The patient's presenting symptoms, physical exam findings, and initial radiographic and laboratory data in the context of their chronic comorbidities is felt to place them at high risk for further clinical deterioration. Furthermore, it is not anticipated that the patient will be medically stable for discharge from the hospital within 2 midnights of admission.   * I certify that at the point of admission it is my clinical judgment that the patient will require inpatient hospital care spanning beyond 2 midnights from the point of admission due to high intensity of service, high risk for further deterioration and high frequency of surveillance required.*   ------- I spent 55 minutes reviewing previous labs/notes,  obtaining separate history at the bedside, counseling/discussing the treatment plan outlined above, ordering medications/tests, and performing clinical documentation.  Author: Arne Langdon, MD 09/24/2023 1:06 PM  For on call review [email or web address].

## 2023-09-24 NOTE — Sepsis Progress Note (Signed)
 ED nurse states that blood culture was drawn

## 2023-09-24 NOTE — TOC CM/SW Note (Signed)
 Transition of Care [facility]) - Inpatient Brief Assessment   Patient Details  Name: NAKKIA MACKIEWICZ MRN: [id number] Date of Birth: 08/26/1945  [facility]) CM/SW Contact:    Juliane Och, LCSW Phone Number: 09/24/2023, 2:48 PM   Clinical Narrative:  2:48 PM Per chart review, patient resides at home. Patient has a PCP and insurance. Patient does not have SNF/HH history. Patient has CPAP from [employer]. Patient's preferred pharmacy's are [facility] and [facility] [id number] [facility]. Patient does not have SDOH needs (as of June 4th, 2025). SDOH needs to be updated to current admission. No TOC needs were identified at this time. TOC will continue to follow and be available to assist.  Transition of Care Asessment: Insurance and Status: Insurance coverage has been reviewed Patient has primary care physician: Yes Home environment has been reviewed: Private Residence Prior level of function:: N/A Prior/Current Home Services: No current home services Social Drivers of Health Review: SDOH reviewed no interventions necessary (As of June 4th, 2025) Readmission risk has been reviewed: Yes Transition of care needs: no transition of care needs at this time

## 2023-09-24 NOTE — Sepsis Progress Note (Signed)
 Elink monitoring for the code sepsis protocol.

## 2023-09-24 NOTE — ED Notes (Signed)
 Lab called for add ons:   Procalcitonin, BNP, and 20 pathogen swab.

## 2023-09-24 NOTE — ED Triage Notes (Signed)
 Pt states she has had a productive cough x 1 week.  Pt states she has had auditory hallucinations as well.  Pt c/o right upper abdominal pain. Pt denies SHOB or fever. Pt states the air has gone out in her apartment recently as well.

## 2023-09-24 NOTE — ED Provider Notes (Signed)
 [name] AT [facility] Provider Note   CSN: [id number] Arrival date & time: 09/24/23  9147     History  Chief Complaint  Patient presents with   Cough   Hallucinations    Joanna Sandoval is a 78 y.o. female.  Pt complains of a cough and congestion for the past week.  Pt reports she has had a fever.  Pt concerned that she has been having hallucinations.  Pt reports chinese people talking to her about bringing her things.  Pt reports she may have been dreaming.  Pt complains of frequent cough.  No chest pain. No abdominal pain.  Pt feels like she has had a fever. Pt reports no air conditioning in her apartment. Pt has a Past medical history of hypertension, colon cancer, PAD.   The history is provided by the patient. No language interpreter was used.  Cough Associated symptoms: myalgias and shortness of breath        Home Medications Prior to Admission medications   Medication Sig Start Date End Date Taking? Authorizing Provider  Accu-Chek Softclix Lancets lancets USE AS DIRECTED 05/17/23   Susanna Epley, FNP  albuterol  (VENTOLIN  HFA) 108 (90 Base) MCG/ACT inhaler Inhale 2 puffs into the lungs every 6 (six) hours as needed. 02/12/23   Susanna Epley, FNP  atenolol (TENORMIN) 100 MG tablet Take 100 mg by mouth daily.    [provider]  atorvastatin  (LIPITOR) 40 MG tablet Take 40 mg by mouth daily. 10/11/22   [provider]  Blood Glucose Monitoring Suppl (ACCU-CHEK GUIDE) w/Device KIT USE TO CHECK BLOOD SUGAR TWICE  DAILY AT 10AM AND 5 PM 02/28/23   Susanna Epley, FNP  Blood Pressure Monitoring (ADULT BLOOD PRESSURE CUFF LG) KIT Take blood pressure daily and as needed 08/15/23   Susanna Epley, FNP  D3-50 50000 units capsule Take 50,000 Units by mouth once a week. 01/14/17   [provider]  diclofenac  sodium (VOLTAREN ) 1 % GEL Apply 2 g topically 4 (four) times daily. Patient not taking: Reported on 09/19/2023 10/30/18    Susanna Epley, FNP  dorzolamide-timolol (COSOPT) 2-0.5 % ophthalmic solution 1 drop 2 (two) times daily. 05/10/22   [provider]  FARXIGA  10 MG TABS tablet TAKE 1 TABLET BY MOUTH DAILY  BEFORE BREAKFAST 09/12/23   [name], [name], FNP  fluticasone  (FLONASE ) 50 MCG/ACT nasal spray Place 2 sprays into both nostrils daily. 08/15/23 08/14/24  [name], [name], FNP  glimepiride  (AMARYL ) 2 MG tablet TAKE 1 TABLET BY MOUTH IN THE  MORNING AND AT BEDTIME 09/04/23   Susanna Epley, FNP  glucose blood (ACCU-CHEK GUIDE) test strip USE AS DIRECTED 02/28/23   Susanna Epley, FNP  JARDIANCE  10 MG TABS tablet TAKE 1 TABLET(10 MG) BY MOUTH DAILY BEFORE BREAKFAST 09/11/23   Susanna Epley, FNP  [name] KIT Use to test blood sugar twice daily as directed. E11.65 05/02/19   [name], [name], FNP  latanoprost (XALATAN) 0.005 % ophthalmic solution 1 drop at bedtime. 05/16/19   [provider]  losartan (COZAAR) 50 MG tablet Take 50 mg by mouth daily.    [provider]      Allergies    Patient has no known allergies.    Review of Systems   Review of Systems  Respiratory:  Positive for cough and shortness of breath.   Musculoskeletal:  Positive for myalgias.  All other systems reviewed and are negative.   Physical Exam Updated Vital Signs BP [name])  126/96 (BP Location: Right Arm)   Pulse 89   Temp (!) 101.4 F (38.6 C) (Oral)   Resp 19   Ht 5\' 2"  (1.575 m)   Wt 89.8 kg   SpO2 (!) 82%   BMI 36.21 kg/m  Physical Exam Vitals and nursing note reviewed.  Constitutional:      Appearance: She is well-developed.  HENT:     Head: Normocephalic.     Right Ear: Tympanic membrane normal.     Left Ear: Tympanic membrane normal.     Nose: Nose normal.     Mouth/Throat:     Mouth: Mucous membranes are moist.  Cardiovascular:     Rate and Rhythm: Normal rate.  Pulmonary:     Breath sounds: Wheezing and rhonchi present.  Abdominal:     General: Abdomen is flat.  There is no distension.  Musculoskeletal:        General: Normal range of motion.     Cervical back: Normal range of motion.  Skin:    General: Skin is warm.  Neurological:     General: No focal deficit present.     Mental Status: She is alert and oriented to person, place, and time.  Psychiatric:        Mood and Affect: Mood normal.     ED Results / Procedures / Treatments   Labs (all labs ordered are listed, but only abnormal results are displayed) Labs Reviewed  COMPREHENSIVE METABOLIC PANEL WITH GFR - Abnormal; Notable for the following components:      Result Value   Glucose, Bld 257 (*)    Creatinine, Ser 1.04 (*)    Calcium  8.8 (*)    GFR, Estimated 56 (*)    All other components within normal limits  CBC - Abnormal; Notable for the following components:   WBC 12.5 (*)    RDW 19.5 (*)    All other components within normal limits  CULTURE, BLOOD (SINGLE)  RESP PANEL BY RT-PCR (RSV, FLU A&B, COVID)  RVPGX2  LIPASE, BLOOD  URINALYSIS, ROUTINE W REFLEX MICROSCOPIC  I-STAT CG4 LACTIC ACID, ED    EKG EKG Interpretation Date/Time:  Monday September 24 2023 07:58:27 EDT Ventricular Rate:  90 PR Interval:  170 QRS Duration:  74 QT Interval:  354 QTC Calculation: 433 R Axis:   40  Text Interpretation: Normal sinus rhythm Nonspecific T wave abnormality  similar to Aug 2024 Confirmed by Jerilynn Montenegro [phone]) on 09/24/2023 8:17:32 AM  Radiology DG Chest Port 1 View Result Date: 09/24/2023 CLINICAL DATA:  Cough. EXAM: PORTABLE CHEST 1 VIEW COMPARISON:  11/21/2022. FINDINGS: Bilateral lung fields are clear. Bilateral costophrenic angles are clear. Normal cardio-mediastinal silhouette. No acute osseous abnormalities. The soft tissues are within normal limits. IMPRESSION: No active disease. Electronically Signed   By: Beula Brunswick M.D.   On: 09/24/2023 09:06    Procedures Procedures    Medications Ordered in ED Medications  cefTRIAXone (ROCEPHIN) 2 g in sodium chloride   0.9 % 100 mL IVPB (2 g Intravenous New Bag/Given 09/24/23 0921)  azithromycin (ZITHROMAX) 500 mg in sodium chloride  0.9 % 250 mL IVPB (has no administration in time range)  ipratropium-albuterol  (DUONEB) 0.5-2.5 (3) MG/3ML nebulizer solution 3 mL (has no administration in time range)  lactated ringers  bolus 1,000 mL (has no administration in time range)  acetaminophen  (TYLENOL ) tablet 650 mg (650 mg Oral Given 09/24/23 0908)    ED Course/ Medical Decision Making/ A&P  Medical Decision Making Patient complains of cough and congestion.  Pt has a fever today.    Amount and/or Complexity of Data Reviewed External Data Reviewed: notes.    Details: Primary care notes reviewed  Labs: ordered. Decision-making details documented in ED Course.    Details: Labs ordered reviewed and interpreted.  Lactic is 1.  Wbc count is 12.5, glucose 257 Radiology: ordered and independent interpretation performed. Decision-making details documented in ED Course.    Details: Chest xray no acute findings ECG/medicine tests: ordered and independent interpretation performed. Decision-making details documented in ED Course.    Details: EKG  normal sinus, no acute findings Discussion of management or test interpretation with external provider(s): Hospitalist consulted for admission   Risk Risk Details: Pt has a temperature of 101.4, 02 is 82% on room air.  Pt does not have asthma or copd.  NO home oxygen.  Pt started on sepsis protocol.  Fluid bolus stopped at 1 liter.  Lactic acid is normal.  Pt given duoneb.  Pt has received Rocephin and tylenol             Final Clinical Impression(s) / ED Diagnoses Final diagnoses:  Hypoxia  Fever, unspecified fever cause  Auditory hallucinations    Rx / DC Orders ED Discharge Orders     None         Evelyn Hire 09/24/23 1036    Jerilynn Montenegro, MD 09/25/23 1429

## 2023-09-25 DIAGNOSIS — J9601 Acute respiratory failure with hypoxia: Secondary | ICD-10-CM | POA: Diagnosis not present

## 2023-09-25 LAB — GLUCOSE, CAPILLARY
Glucose-Capillary: 165 mg/dL — ABNORMAL HIGH (ref 70–99)
Glucose-Capillary: 208 mg/dL — ABNORMAL HIGH (ref 70–99)
Glucose-Capillary: 178 mg/dL — ABNORMAL HIGH (ref 70–99)
Glucose-Capillary: 178 mg/dL — ABNORMAL HIGH (ref 70–99)

## 2023-09-25 LAB — CBC
HCT: 42.2 % (ref 36.0–46.0)
Hemoglobin: 13 g/dL (ref 12.0–15.0)
MCH: 28.8 pg (ref 26.0–34.0)
MCHC: 30.8 g/dL (ref 30.0–36.0)
MCV: 93.6 fL (ref 80.0–100.0)
Platelets: 222 10*3/uL (ref 150–400)
RBC: 4.51 MIL/uL (ref 3.87–5.11)
RDW: 19.2 % — ABNORMAL HIGH (ref 11.5–15.5)
WBC: 17.3 10*3/uL — ABNORMAL HIGH (ref 4.0–10.5)
nRBC: 0.2 % (ref 0.0–0.2)

## 2023-09-25 LAB — BASIC METABOLIC PANEL WITH GFR
Anion gap: 6 (ref 5–15)
BUN: 17 mg/dL (ref 8–23)
CO2: 30 mmol/L (ref 22–32)
Calcium: 8.3 mg/dL — ABNORMAL LOW (ref 8.9–10.3)
Chloride: 104 mmol/L (ref 98–111)
Creatinine, Ser: 0.79 mg/dL (ref 0.44–1.00)
GFR, Estimated: >60 mL/min (ref >60)
Glucose, Bld: 173 mg/dL — ABNORMAL HIGH (ref 70–99)
Potassium: 4.5 mmol/L (ref 3.5–5.1)
Sodium: 140 mmol/L (ref 135–145)

## 2023-09-25 MED ORDER — BUTALBITAL-APAP-CAFFEINE 50-325-40 MG PO TABS
1.0000 | ORAL_TABLET | Freq: Once | ORAL | Status: AC
  Administered 2023-09-25: 1 via ORAL
  Filled 2023-09-25: qty 1

## 2023-09-25 MED ORDER — DM-GUAIFENESIN ER 30-600 MG PO TB12
1.0000 | ORAL_TABLET | Freq: Two times a day (BID) | ORAL | Status: DC
  Administered 2023-09-25 – 2023-09-26 (×3): 1 via ORAL
  Filled 2023-09-25 (×4): qty 1

## 2023-09-25 MED ORDER — PREDNISONE 10 MG PO TABS
40.0000 mg | ORAL_TABLET | Freq: Every day | ORAL | Status: AC
  Administered 2023-09-26 – 2023-09-28 (×3): 40 mg via ORAL
  Filled 2023-09-25 (×3): qty 4

## 2023-09-25 NOTE — Progress Notes (Signed)
  Progress Note   Patient: Joanna Sandoval ZOX:[id number] DOB: [birth year] DOA: 09/24/2023     1 DOS: the patient was seen and examined on 09/25/2023   Brief hospital course:  "Joanna Sandoval is a 78 y.o. female with medical history significant of HTN, HLD, and DM2 p/w cough for weeks. " See H&P for full HPI on admission & ED course.  Patient was admitted on 09/24/2023 for further evaluation and management of acute respiratory failure with hypoxia requiring 4 L/min nasal cannula oxygen.  Patient was subsequently found to have Parainfluenza virus infection on full respiratory viral panel.  Chest x-ray negative for signs of pneumonia.    Assessment and Plan:  Acute respiratory failure with hypoxia Sepsis due to Parainfluenza virus infection Possible undiagnosed COPD (pt endorses 2pack/year x atleast 5 years, but quit years ago).  BNP normal.  No PNA on CXR. Procal negative. Pt met for sepsis criteria on admission with fever, leukocytosis in setting of viral infection.  Normal lactic acid and BP's stable. --Given antibiotics in ED, no indication for further at this time --Scheduled Duonebs --Will add 3 days Prednisone  for mild wheezing --Incentive spirometer and flutter valve --Add scheduled Mucinex BID --Supplement O2 and wean as tolerated, goal spO2>90% --Monitor fever curve, CBC   Uncontrolled type 2 diabetes HbA1c is 10.2% --Reduced dose glimepiride  2mg  daily (pta BID) --Sliding scale Novolog --Titrate regimen for goal 140-180    HTN --Continue home losartan and atenolol     HLD --Continue home atorvastatin           Subjective: Pt awake resting in bed, seen with RN at bedside this AM.  Pt reports ongoing cough and generalized weakness.  Denies fever/chills, abdominal pain or nausea/vomiting.  Reports having loose stools since this illness started as well.     Physical Exam: Vitals:   09/24/23 2127 09/25/23 0611 09/25/23 0805 09/25/23 0900  BP: (!) 138/58 (!)  133/51  (!) 141/58  Pulse: 68 71  79  Resp:    18  Temp: 98.8 F (37.1 C) 99.9 F (37.7 C)  98.9 F (37.2 C)  TempSrc: Oral Oral  Oral  SpO2: 99% 93% 94% 97%  Weight:      Height:       General exam: awake, alert, no acute distress HEENT: moist mucus membranes, hearing grossly normal  Respiratory system: CTAB, frequent coarse sounding coughing spells, mild intermittent wheezes, no rhonchi, normal respiratory effort at rest on 4 L/min [place] o2. Cardiovascular system: normal S1/S2, RRR, no JVD, murmurs, rubs, gallops, no pedal edema.   Gastrointestinal system: soft, NT, ND, no HSM felt, +bowel sounds. Central nervous system: A&O x 3. no gross focal neurologic deficits, normal speech Extremities: moves all, no edema, normal tone Skin: dry, intact, normal temperature Psychiatry: normal mood, congruent affect, judgement and insight appear normal    Data Reviewed:  Notable labs --  Glucose 173, Ca 8.3 WBC 11.6 >> 17.3 Hb A1c 10.2%  RVP +Parainfluenza virus   Family Communication: None at bedside on rounds, will attempt to call as time allows  Disposition: Status is: Inpatient Remains inpatient appropriate because: Still requiring oxygen, without baseline O2 requirement   Planned Discharge Destination: Home    Time spent: 45 minutes  Author: Montey Apa, DO 09/25/2023 12:06 PM  For on call review [email or web address].

## 2023-09-25 NOTE — Inpatient Diabetes Management (Addendum)
 Inpatient Diabetes Program Recommendations  AACE/ADA: New Consensus Statement on Inpatient Glycemic Control  Target Ranges:  Prepandial:   less than 140 mg/dL      Peak postprandial:   less than 180 mg/dL (1-2 hours)      Critically ill patients:  140 - 180 mg/dL    Latest Reference Range & Units 09/24/23 14:07 09/24/23 16:18 09/25/23 08:03  Glucose-Capillary 70 - 99 mg/dL 409 (H) 811 (H) 914 (H)    Latest Reference Range & Units 04/26/23 10:01 08/15/23 10:41 09/24/23 14:29  Hemoglobin A1C 4.8 - 5.6 % 10.0 (H) 11.0 (H) 10.2 (H)   Review of Glycemic Control  Diabetes history: DM2 Outpatient Diabetes medications: Amaryl  2 mg BID, Jardiance  10 mg QHS, Farxiga  10 mg QAM Current orders for Inpatient glycemic control: Amaryl  2 mg QAM, Novolog 0-15 units TID with meals; Prednisone  40 mg QAM  Inpatient Diabetes Program Recommendations:    HbgA1C:  A1C 10.2% on 09/24/23 indicating an average glucose of 248 mg/dl over the past 2-3 months. Patient reports she takes Jardiance  10 mg at bedtime and Farxiga  10 mg QAM, and Amaryl  2 mg BID. Patient should not be taking both Jardiance  and Farxiga  (as both are in the same drug class).  Patient is NOT willing to take any type of injection (insulin nor GLP-1).  At time of discharge, would recommend to clarify SGLT2 medication (may want to discontinue Farxiga  and change Jardiance  to 25 mg daily) add Tradjenta 5 mg daily.  NOTE: Spoke with patient about diabetes and home regimen for diabetes control. Patient reports being followed by PCP for diabetes management and currently taking Jardiance  10 mg at bedtime and Farxiga  10 mg QAM, and Amaryl  2 mg BID as an outpatient for diabetes control. Patient reports taking DM medications as prescribed.  Patient reports glucose is consistently in 200's mg/dl.   Discussed A1C results (10.2% on 09/24/23) and explained that current A1C indicates an average glucose of 248 mg/dl over the past 2-3 months. Discussed glucose and A1C goals.  Discussed importance of checking CBGs and maintaining good CBG control to prevent long-term and short-term complications. Explained how hyperglycemia leads to damage within blood vessels which lead to the common complications seen with uncontrolled diabetes. Stressed to the patient the importance of improving glycemic control to prevent further complications from uncontrolled diabetes. Discussed impact of nutrition, exercise, stress, sickness, and medications on diabetes control.  Discussed carbohydrates, carbohydrate goals per day and meal, along with portion sizes. Patient drinks regular sodas, loves potatoes, bread, fruit, and ice cream sundaes.  Discussed limiting sweets and high carb foods; encouraged patient to stop drinking sugary beverages and switch to diet sodas.  Asked patient if she would be willing to make dietary changes and she stated, "If you say so."  Discussed that in order to get DM under better control, she will likely need to add additional DM medications. Inquired about using an injectable and patient states she will NOT use any type of injectable (not insulin nor GLP-1). Patient states she would be amendable to adding another oral DM medication as long as it isn't Metformin (reports she does not tolerate Metformin).  Asked that she pay attention to discharge instructions to see if any changes were made with the Farxiga  or Jardiance  and to see if any new DM medications would be added. Encouraged patient to follow up with PCP regarding DM control.  Patient verbalized understanding of information discussed and reports no further questions at this time related to diabetes.  Thanks, Beacher Limerick, RN, MSN, CDCES Diabetes Coordinator Inpatient Diabetes Program [phone] (Team Pager from 8am to 5pm)

## 2023-09-25 NOTE — Plan of Care (Signed)
  Problem: Nutritional: Goal: Maintenance of adequate nutrition will improve Outcome: Progressing   Problem: Clinical Measurements: Goal: Will remain free from infection Outcome: Progressing

## 2023-09-26 ENCOUNTER — Telehealth: Payer: Self-pay

## 2023-09-26 DIAGNOSIS — J9601 Acute respiratory failure with hypoxia: Secondary | ICD-10-CM | POA: Diagnosis not present

## 2023-09-26 LAB — BASIC METABOLIC PANEL WITH GFR
Anion gap: 5 (ref 5–15)
Anion gap: 6 (ref 5–15)
BUN: 17 mg/dL (ref 8–23)
BUN: 26 mg/dL — ABNORMAL HIGH (ref 8–23)
CO2: 34 mmol/L — ABNORMAL HIGH (ref 22–32)
CO2: 34 mmol/L — ABNORMAL HIGH (ref 22–32)
Calcium: 9.1 mg/dL (ref 8.9–10.3)
Calcium: 9.3 mg/dL (ref 8.9–10.3)
Chloride: 103 mmol/L (ref 98–111)
Chloride: 98 mmol/L (ref 98–111)
Creatinine, Ser: 0.89 mg/dL (ref 0.44–1.00)
Creatinine, Ser: 1.11 mg/dL — ABNORMAL HIGH (ref 0.44–1.00)
GFR, Estimated: >60 mL/min (ref >60)
GFR, Estimated: 52 mL/min — ABNORMAL LOW (ref >60)
Glucose, Bld: 211 mg/dL — ABNORMAL HIGH (ref 70–99)
Glucose, Bld: 379 mg/dL — ABNORMAL HIGH (ref 70–99)
Potassium: 4.7 mmol/L (ref 3.5–5.1)
Potassium: 4.6 mmol/L (ref 3.5–5.1)
Sodium: 142 mmol/L (ref 135–145)
Sodium: 138 mmol/L (ref 135–145)

## 2023-09-26 LAB — CBC
HCT: 42 % (ref 36.0–46.0)
Hemoglobin: 12.6 g/dL (ref 12.0–15.0)
MCH: 28.4 pg (ref 26.0–34.0)
MCHC: 30 g/dL (ref 30.0–36.0)
MCV: 94.6 fL (ref 80.0–100.0)
Platelets: 223 10*3/uL (ref 150–400)
RBC: 4.44 MIL/uL (ref 3.87–5.11)
RDW: 19.4 % — ABNORMAL HIGH (ref 11.5–15.5)
WBC: 14.1 10*3/uL — ABNORMAL HIGH (ref 4.0–10.5)
nRBC: 0.2 % (ref 0.0–0.2)

## 2023-09-26 LAB — PHOSPHORUS: Phosphorus: 3.1 mg/dL (ref 2.5–4.6)

## 2023-09-26 LAB — GLUCOSE, CAPILLARY
Glucose-Capillary: 193 mg/dL — ABNORMAL HIGH (ref 70–99)
Glucose-Capillary: 265 mg/dL — ABNORMAL HIGH (ref 70–99)
Glucose-Capillary: 260 mg/dL — ABNORMAL HIGH (ref 70–99)
Glucose-Capillary: 411 mg/dL — ABNORMAL HIGH (ref 70–99)
Glucose-Capillary: 432 mg/dL — ABNORMAL HIGH (ref 70–99)

## 2023-09-26 LAB — VITAMIN D 25 HYDROXY (VIT D DEFICIENCY, FRACTURES): Vit D, 25-Hydroxy: 41.66 ng/mL (ref 30–100)

## 2023-09-26 LAB — FOLATE: Folate: 38.7 ng/mL (ref >5.9)

## 2023-09-26 LAB — VITAMIN B12: Vitamin B-12: 395 pg/mL (ref 180–914)

## 2023-09-26 LAB — MAGNESIUM: Magnesium: 2.1 mg/dL (ref 1.7–2.4)

## 2023-09-26 MED ORDER — BENZONATATE 100 MG PO CAPS
100.0000 mg | ORAL_CAPSULE | Freq: Two times a day (BID) | ORAL | Status: DC | PRN
  Administered 2023-09-27: 100 mg via ORAL
  Filled 2023-09-26: qty 1

## 2023-09-26 MED ORDER — ONDANSETRON HCL 4 MG/2ML IJ SOLN: 4.0000 mg | Freq: Four times a day (QID) | INTRAMUSCULAR | Status: DC | PRN

## 2023-09-26 MED ORDER — INSULIN ASPART 100 UNIT/ML IJ SOLN
6.0000 [IU] | Freq: Once | INTRAMUSCULAR | Status: AC
  Administered 2023-09-26: 6 [IU] via SUBCUTANEOUS

## 2023-09-26 MED ORDER — FLUTICASONE FUROATE-VILANTEROL 200-25 MCG/ACT IN AEPB
1.0000 | INHALATION_SPRAY | Freq: Every day | RESPIRATORY_TRACT | Status: DC
  Administered 2023-09-27 – 2023-09-28 (×2): 1 via RESPIRATORY_TRACT
  Filled 2023-09-26: qty 28

## 2023-09-26 MED ORDER — GUAIFENESIN ER 600 MG PO TB12
600.0000 mg | ORAL_TABLET | Freq: Two times a day (BID) | ORAL | Status: DC
  Administered 2023-09-26 – 2023-09-28 (×4): 600 mg via ORAL
  Filled 2023-09-26 (×4): qty 1

## 2023-09-26 MED ORDER — HYDROCOD POLI-CHLORPHE POLI ER 10-8 MG/5ML PO SUER
5.0000 mL | Freq: Two times a day (BID) | ORAL | Status: DC | PRN
  Administered 2023-09-27 (×2): 5 mL via ORAL
  Filled 2023-09-26 (×2): qty 5

## 2023-09-26 NOTE — Progress Notes (Signed)
 SATURATION QUALIFICATIONS: (This note is used to comply with regulatory documentation for home oxygen)  Patient Saturations on Room Air at Rest = 89%  Patient Saturations on Room Air while Ambulating = 81%  Patient Saturations on 3 Liters of oxygen while Ambulating = 93%  Please briefly explain why patient needs home oxygen: Patient hypoxic on RA with mobility.  Abigail Hoff, PT Acute Rehabilitation Services Office:[phone] 09/26/2023

## 2023-09-26 NOTE — TOC Initial Note (Signed)
 Transition of Care [facility]) - Initial/Assessment Note    Patient Details  Name: Joanna Sandoval MRN: [id number] Date of Birth: 06-08-1945  Transition [facility]) CM/SW Contact:    Jeani Mill, RN Phone Number: 09/26/2023, 3:27 PM  Clinical Narrative:                 Spoke to patient regarding transition needs.  Patient lives alone and has walker and rollator.  Patient's son and niece assist her with transportation and getting groceries.  Waiting for PT, OT consults TOC will continue to follow for needs.  Expected Discharge Plan: Home w Home Health Services Barriers to Discharge: Continued Medical Work up   Patient Goals and CMS Choice Patient states their goals for this hospitalization and ongoing recovery are:: Return home          Expected Discharge Plan and Services       Living arrangements for the past 2 months: Apartment                                      Prior Living Arrangements/Services Living arrangements for the past 2 months: Apartment Lives with:: Self Patient language and need for interpreter reviewed:: Yes Do you feel safe going back to the place where you live?: Yes      Need for Family Participation in Patient Care: Yes (Comment) Care giver support system in place?: Yes (comment) Current home services: DME (walker and rollator) Criminal Activity/Legal Involvement Pertinent to Current Situation/Hospitalization: No - Comment as needed  Activities of Daily Living   ADL Screening (condition at time of admission) Independently performs ADLs?: Yes (appropriate for developmental age) Is the patient deaf or have difficulty hearing?: No Does the patient have difficulty seeing, even when wearing glasses/contacts?: No Does the patient have difficulty concentrating, remembering, or making decisions?: No  Permission Sought/Granted                  Emotional Assessment   Attitude/Demeanor/Rapport: Engaged Affect (typically  observed): Accepting Orientation: : Oriented to Self, Oriented to Place, Oriented to  Time, Oriented to Situation Alcohol / Substance Use: Not Applicable Psych Involvement: No (comment)  Admission diagnosis:  Auditory hallucinations [R44.0] Hypoxia [R09.02] Fever, unspecified fever cause [R50.9] Acute hypoxemic respiratory failure (HCC) [J96.01] Patient Active Problem List   Diagnosis Date Noted   Acute hypoxemic respiratory failure (HCC) 09/24/2023   Encounter for annual health examination 08/26/2023   Chronic pain of both knees 08/26/2023   Seasonal allergies 08/15/2023   Need for influenza vaccination 05/07/2023   COVID-19 vaccination declined 05/07/2023   Aortic atherosclerosis (HCC) 04/26/2023   Class 2 severe obesity due to excess calories with serious comorbidity and body mass index (BMI) of 36.0 to 36.9 in adult (HCC) 04/26/2023   Urinary frequency 04/26/2023   Primary open angle glaucoma (POAG) of both eyes, mild stage 12/11/2022   Family history of Parkinson disease 11/28/2022   Tremor of both hands 11/28/2022   Hearing loss of left ear 11/28/2022   Bilateral impacted cerumen 11/28/2022   Hypertensive heart disease without heart failure 11/28/2022   Right flank pain 11/28/2022   Reactive depression 08/27/2022   Vitamin D  deficiency 08/27/2022   Gout involving toe 08/27/2022   PAD (peripheral artery disease) (HCC) 08/14/2022   Positive colorectal cancer screening using Cologuard test 05/22/2022   Colon cancer screening 05/05/2020   Obesity, morbid (HCC) 05/05/2020  Essential hypertension 04/30/2018   Fibroid 11/11/2011   PCP:  Susanna Epley, FNP Pharmacy:   [facility] - [place], [place] - [phone] [address] [place] [address] Phone: [phone] Fax: [phone]  [facility] [phone] - [place], [place] [address] AT [facility] [address] [facility] [address] [place]  [address] Phone: [phone] Fax: [phone]     Social Drivers of Health (SDOH) Social History: SDOH Screenings   Food Insecurity: No Food Insecurity (09/24/2023)  Housing: Low Risk  (09/24/2023)  Transportation Needs: No Transportation Needs (09/24/2023)  Utilities: Not At Risk (09/24/2023)  Alcohol Screen: Low Risk  (09/19/2023)  Depression (PHQ2-9): Low Risk  (09/19/2023)  Recent Concern: Depression (PHQ2-9) - High Risk (08/15/2023)  Financial Resource Strain: Low Risk  (09/19/2023)  Physical Activity: Inactive (09/19/2023)  Social Connections: Moderately Integrated (09/24/2023)  Recent Concern: Social Connections - Moderately Isolated (09/19/2023)  Stress: Stress Concern Present (09/19/2023)  Tobacco Use: Medium Risk (09/24/2023)  Health Literacy: Adequate Health Literacy (09/19/2023)   SDOH Interventions:     Readmission Risk Interventions     No data to display

## 2023-09-26 NOTE — Progress Notes (Signed)
  Progress Note   Patient: Joanna Sandoval:[id number] DOB: [birth year] DOA: 09/24/2023     2 DOS: the patient was seen and examined on 09/26/2023   Brief hospital course:  Joanna Sandoval is a 78 y.o. female with medical history significant of HTN, HLD, and DM2 p/w cough for weeks.  See H&P for full HPI on admission & ED course.  Patient was admitted on 09/24/2023 for further evaluation and management of acute respiratory failure with hypoxia requiring 4 L/min nasal cannula oxygen.  Patient was subsequently found to have Parainfluenza virus infection on full respiratory viral panel.  Chest x-ray negative for signs of pneumonia.    Assessment and Plan:  Acute hypoxic respiratory failure  Sepsis due to Parainfluenza virus infection Possible undiagnosed COPD (pt endorses 2pack/year x atleast 5 years, but quit years ago).  BNP normal.  No PNA on CXR. Procal negative. Pt met for sepsis criteria on admission with fever, leukocytosis in setting of viral infection.  Normal lactic acid and BP's stable. --Given antibiotics in ED, no indication for further at this time --Scheduled Duonebs -- Continue prednisone  40 mg p.o. daily for 3 days --Incentive spirometer and flutter valve --Add scheduled Mucinex BID --Supplement O2 and wean as tolerated, goal spO2>90% --Monitor fever curve, CBC 6/11 started Breo Ellipta inhaler due to persistent wheezing and shortness of breath Started Mucinex 600 twice daily, Tussionex as needed for cough   Uncontrolled type 2 diabetes HbA1c is 10.2% --Reduced dose glimepiride  2mg  daily (pta BID) --Sliding scale Novolog --Titrate regimen for goal 140-180    HTN --Continue home losartan and atenolol     HLD --Continue home atorvastatin     Generalized weakness could be secondary to viral infection Vitamin D  and B12 are within normal range Elevated MCV, B12 and folate within normal range     Subjective:  No significant events overnight, patient still  has significant productive cough and shortness of breath, wheezes. Denied any chest pain or palpitations.    Physical Exam: Vitals:   09/25/23 2116 09/26/23 0253 09/26/23 0334 09/26/23 0821  BP:   (!) 177/71 (!) 156/59  Pulse: 83 61 67 78  Resp: 20 18 16 16   Temp:   99.6 F (37.6 C) 99 F (37.2 C)  TempSrc:   Oral Oral  SpO2: 97% 96% 99% 98%  Weight:      Height:       General exam: awake, alert, no acute distress HEENT: moist mucus membranes, hearing grossly normal  Respiratory system: Equal air entry bilaterally, bilateral crackles and wheezes. On 3L via [place] Cardiovascular system: normal S1/S2, RRR, no JVD, murmurs, rubs, gallops, no pedal edema.   Gastrointestinal system: soft, NT, ND, no HSM felt, +bowel sounds. Central nervous system: A&O x 3. no gross focal neurologic deficits, normal speech Extremities: moves all, no edema, normal tone Skin: dry, intact, normal temperature Psychiatry: normal mood, congruent affect, judgement and insight appear normal    Data Reviewed:  Notable labs --  Glucose 211, Ca 8.3--9.1 WBC 11.6 >> 17.3>>14.1 Hb A1c 10.2%  RVP +Parainfluenza virus   Family Communication: None at bedside on rounds, will attempt to call as time allows  Disposition: Status is: Inpatient Remains inpatient appropriate because: Still requiring oxygen, without baseline O2 requirement   Planned Discharge Destination: Home    Time spent: 45 minutes  Author: Althia Atlas, MD 09/26/2023 2:59 PM  For on call review [email or web address].

## 2023-09-26 NOTE — Plan of Care (Signed)
 Pt alert and oriented x4. Skin warm and dry. Resting in bed. Family visited. No resp distress. O2 via N/C @ 4l/min. Tolerating po intake. Blood glucose monitored. No acute problems. Encouraged to call for assistance as needed.call light in reach.bed alarm activated.

## 2023-09-26 NOTE — Telephone Encounter (Signed)
 Copied from CRM [id number]. Topic: General - Other >> Sep 26, 2023  9:43 AM Kevelyn M wrote: Reason for CRM: Patient is in the hospital and wanted to let her doctor know. They still don't quite know what it is yet. Will f/u with pt when discharged.

## 2023-09-26 NOTE — Evaluation (Signed)
 Physical Therapy Evaluation Patient Details Name: Joanna Sandoval MRN: [id number] DOB: 02-Nov-1945 Today's Date: 09/26/2023  History of Present Illness  Patient is a 78 y/o female admitted 09/24/23 with cough and acute respiratory failure with hypoxia requiring 4L O2, found to have parainfluenza virus, CXR negative for signs of pneumonia.  PMH positive for HTN, DM, HLD.  Clinical Impression  Patient presents with decreased mobility due to generalized weakness, decreased balance, decreased activity tolerance and new oxygen requirement.  Patient previously independent with help for some IADL's.  She needs CGA to S for in hallway ambulation with RW and some A for hygiene after toileting.  Feel she will benefit from skilled PT In the acute setting and follow up HHPT with aide at d/c.         If plan is discharge home, recommend the following: A little help with walking and/or transfers;A little help with bathing/dressing/bathroom;Help with stairs or ramp for entrance   Can travel by private vehicle        Equipment Recommendations Rolling walker (2 wheels)  Recommendations for Other Services       Functional Status Assessment Patient has had a recent decline in their functional status and demonstrates the ability to make significant improvements in function in a reasonable and predictable amount of time.     Precautions / Restrictions Precautions Precautions: Fall Precaution/Restrictions Comments: watch O2      Mobility  Bed Mobility Overal bed mobility: Needs Assistance Bed Mobility: Supine to Sit     Supine to sit: Supervision          Transfers Overall transfer level: Needs assistance Equipment used: Rolling walker (2 wheels) Transfers: Sit to/from Stand Sit to Stand: Supervision, Contact guard assist           General transfer comment: cues for hand placement, assist for balance, using grabbar in bathroom    Ambulation/Gait Ambulation/Gait assistance:  Supervision Gait Distance (Feet): 140 Feet Assistive device: Rolling walker (2 wheels) Gait Pattern/deviations: Step-through pattern, Decreased stride length       General Gait Details: slower speed, no LOB assist for O2  Stairs            Wheelchair Mobility     Tilt Bed    Modified Rankin (Stroke Patients Only)       Balance Overall balance assessment: Needs assistance   Sitting balance-Leahy Scale: Good     Standing balance support: During functional activity, No upper extremity supported Standing balance-Leahy Scale: Fair Standing balance comment: completing toilet hygiene with S standing over 5 minutes, finally assisted for completeness; reports she poops when she coughs, wearing depends from home                             Pertinent Vitals/Pain Pain Assessment Pain Assessment: Faces Faces Pain Scale: Hurts little more Pain Location: chest and abdomen with coughing and headache Pain Descriptors / Indicators: Headache, Sore Pain Intervention(s): Monitored during session, Repositioned    Home Living Family/patient expects to be discharged to:: Private residence Living Arrangements: Alone Available Help at Discharge: Family;Available PRN/intermittently Type of Home: Apartment Home Access: Stairs to enter Entrance Stairs-Rails: Right Entrance Stairs-Number of Steps: 3   Home Layout: One level Home Equipment: Grab bars - tub/shower;Shower seat;Cane - single point      Prior Function Prior Level of Function : Independent/Modified Independent;Needs assist  ADLs Comments: some help for grocery shopping and driving     Extremity/Trunk Assessment   Upper Extremity Assessment Upper Extremity Assessment: Generalized weakness    Lower Extremity Assessment Lower Extremity Assessment: Generalized weakness    Cervical / Trunk Assessment Cervical / Trunk Assessment: Kyphotic  Communication   Communication Communication:  No apparent difficulties    Cognition Arousal: Alert Behavior During Therapy: WFL for tasks assessed/performed   PT - Cognitive impairments: Problem solving, Memory                       PT - Cognition Comments: slow processing and repeats herself throughout session Following commands: Intact       Cueing Cueing Techniques: Verbal cues     General Comments      Exercises     Assessment/Plan    PT Assessment Patient needs continued PT services  PT Problem List Decreased strength;Decreased activity tolerance;Decreased balance;Decreased mobility;Cardiopulmonary status limiting activity;Decreased knowledge of use of DME       PT Treatment Interventions DME instruction;Gait training;Stair training;Patient/family education;Functional mobility training;Therapeutic activities;Therapeutic exercise;Balance training    PT Goals (Current goals can be found in the Care Plan section)  Acute Rehab PT Goals Patient Stated Goal: return to independent PT Goal Formulation: With patient Time For Goal Achievement: 10/10/23 Potential to Achieve Goals: Good    Frequency Min 2X/week     Co-evaluation               AM-PAC PT 6 Clicks Mobility  Outcome Measure Help needed turning from your back to your side while in a flat bed without using bedrails?: None Help needed moving from lying on your back to sitting on the side of a flat bed without using bedrails?: A Little Help needed moving to and from a bed to a chair (including a wheelchair)?: A Little Help needed standing up from a chair using your arms (e.g., wheelchair or bedside chair)?: A Little Help needed to walk in hospital room?: A Little Help needed climbing 3-5 steps with a railing? : A Little 6 Click Score: 19    End of Session Equipment Utilized During Treatment: Gait belt Activity Tolerance: Patient tolerated treatment well Patient left: in chair;with call bell/phone within reach   PT Visit Diagnosis:  Muscle weakness (generalized) (M62.81);Difficulty in walking, not elsewhere classified (R26.2)    Time: 1610-9604 PT Time Calculation (min) (ACUTE ONLY): 37 min   Charges:   PT Evaluation $PT Eval Moderate Complexity: 1 Mod PT Treatments $Gait Training: 8-22 mins PT General Charges $$ ACUTE PT VISIT: 1 Visit         Abigail Hoff, PT Acute Rehabilitation Services [phone] 09/26/2023   Joanna Sandoval 09/26/2023, 3:30 PM

## 2023-09-27 DIAGNOSIS — J9601 Acute respiratory failure with hypoxia: Secondary | ICD-10-CM | POA: Diagnosis not present

## 2023-09-27 LAB — BASIC METABOLIC PANEL WITH GFR
Anion gap: 5 (ref 5–15)
BUN: 25 mg/dL — ABNORMAL HIGH (ref 8–23)
CO2: 35 mmol/L — ABNORMAL HIGH (ref 22–32)
Calcium: 9.5 mg/dL (ref 8.9–10.3)
Chloride: 102 mmol/L (ref 98–111)
Creatinine, Ser: 0.77 mg/dL (ref 0.44–1.00)
GFR, Estimated: >60 mL/min (ref >60)
Glucose, Bld: 158 mg/dL — ABNORMAL HIGH (ref 70–99)
Potassium: 4.1 mmol/L (ref 3.5–5.1)
Sodium: 142 mmol/L (ref 135–145)

## 2023-09-27 LAB — CBC
HCT: 40.7 % (ref 36.0–46.0)
Hemoglobin: 12.4 g/dL (ref 12.0–15.0)
MCH: 28.2 pg (ref 26.0–34.0)
MCHC: 30.5 g/dL (ref 30.0–36.0)
MCV: 92.7 fL (ref 80.0–100.0)
Platelets: 233 10*3/uL (ref 150–400)
RBC: 4.39 MIL/uL (ref 3.87–5.11)
RDW: 19.1 % — ABNORMAL HIGH (ref 11.5–15.5)
WBC: 13.1 10*3/uL — ABNORMAL HIGH (ref 4.0–10.5)
nRBC: 0.2 % (ref 0.0–0.2)

## 2023-09-27 LAB — PHOSPHORUS: Phosphorus: 3.1 mg/dL (ref 2.5–4.6)

## 2023-09-27 LAB — GLUCOSE, CAPILLARY
Glucose-Capillary: 145 mg/dL — ABNORMAL HIGH (ref 70–99)
Glucose-Capillary: 220 mg/dL — ABNORMAL HIGH (ref 70–99)
Glucose-Capillary: 230 mg/dL — ABNORMAL HIGH (ref 70–99)
Glucose-Capillary: 324 mg/dL — ABNORMAL HIGH (ref 70–99)

## 2023-09-27 LAB — MAGNESIUM: Magnesium: 2.1 mg/dL (ref 1.7–2.4)

## 2023-09-27 MED ORDER — GLIMEPIRIDE 2 MG PO TABS
2.0000 mg | ORAL_TABLET | Freq: Two times a day (BID) | ORAL | Status: DC
  Administered 2023-09-27 – 2023-09-28 (×2): 2 mg via ORAL
  Filled 2023-09-27 (×3): qty 1

## 2023-09-27 MED ORDER — IPRATROPIUM-ALBUTEROL 0.5-2.5 (3) MG/3ML IN SOLN: 3.0000 mL | Freq: Four times a day (QID) | RESPIRATORY_TRACT | Status: DC | PRN

## 2023-09-27 NOTE — Progress Notes (Signed)
 Progress Note   Patient: Joanna Sandoval EAV:[id number] DOB: 09-25-1945 DOA: 09/24/2023     3 DOS: the patient was seen and examined on 09/27/2023   Brief hospital course:  Joanna Sandoval is a 78 y.o. female with medical history significant of HTN, HLD, and DM2 p/w cough for weeks.  See H&P for full HPI on admission & ED course.  Patient was admitted on 09/24/2023 for further evaluation and management of acute respiratory failure with hypoxia requiring 4 L/min nasal cannula oxygen.  Patient was subsequently found to have Parainfluenza virus infection on full respiratory viral panel.  Chest x-ray negative for signs of pneumonia.    Assessment and Plan:  Acute hypoxic respiratory failure  Sepsis due to Parainfluenza virus infection Possible undiagnosed COPD (pt endorses 2pack/year x atleast 5 years, but quit years ago).  BNP normal.  No PNA on CXR. Procal negative. Pt met for sepsis criteria on admission with fever, leukocytosis in setting of viral infection.  Normal lactic acid and BP's stable. --Given antibiotics in ED, no indication for further at this time --Scheduled Duonebs -- Continue prednisone  40 mg p.o. daily for 3 days --Incentive spirometer and flutter valve --Add scheduled Mucinex BID --Supplement O2 and wean as tolerated, goal spO2>90% --Monitor fever curve, CBC 6/11 started Breo Ellipta inhaler due to persistent wheezing and shortness of breath Started Mucinex 600 twice daily, Tussionex as needed for cough   Uncontrolled type 2 diabetes HbA1c is 10.2% --resumed glimepiride  2mg  po BID pta dose  --Sliding scale Novolog --Titrate regimen for goal 140-180    HTN --Continue home losartan and atenolol     HLD --Continue home atorvastatin     Generalized weakness could be secondary to viral infection Vitamin D  and B12 are within normal range Elevated MCV, B12 and folate within normal range     Subjective:  No significant events overnight, patient feels  improvement in the shortness of breath, still requiring 2 L oxygen via nasal cannula.  Patient still having persistent coughing, requesting for stronger medication.  Denied any chest pain or palpitations, no any other complaints    Physical Exam: Vitals:   09/26/23 2026 09/27/23 0439 09/27/23 0839 09/27/23 0850  BP:  123/65 (!) 126/96   Pulse:  64 93 92  Resp:  18 18 17   Temp:  98.3 F (36.8 C) 98.1 F (36.7 C)   TempSrc:  Oral Oral   SpO2: 95% 95% 96% 96%  Weight:      Height:       General exam: awake, alert, no acute distress HEENT: moist mucus membranes, hearing grossly normal  Respiratory system: Equal air entry bilaterally, bilateral crackles and wheezes. On 3L via [place] Cardiovascular system: normal S1/S2, RRR, no JVD, murmurs, rubs, gallops, no pedal edema.   Gastrointestinal system: soft, NT, ND, no HSM felt, +bowel sounds. Central nervous system: A&O x 3. no gross focal neurologic deficits, normal speech Extremities: moves all, no edema, normal tone Skin: dry, intact, normal temperature Psychiatry: normal mood, congruent affect, judgement and insight appear normal    Data Reviewed:  Notable labs --  Glucose 211, Ca 8.3--9.1 WBC 11.6 >> 17.3>>14.1 Hb A1c 10.2%  RVP +Parainfluenza virus   Family Communication: None at bedside on rounds, will attempt to call as time allows  Disposition: Status is: Inpatient Remains inpatient appropriate because: Still requiring oxygen, without baseline O2 requirement Most likely discharge tomorrow a.m. after weaning off oxygen   Planned Discharge Destination: Home    Time spent: 40 minutes  Author: Althia Atlas, MD 09/27/2023 2:42 PM  For on call review [email or web address].

## 2023-09-27 NOTE — Plan of Care (Signed)
 Pt alert and oriented x4. Skin warm and dry. Resting in the bedside recliner. Family visited earlier this shift. Tolerating po intake.no complaints of pain. O2 infusing per n/c @ 3l/min. No resp distress. Productive cough noted. Pt's blood glucose elevated this pm. MD made aware. Rec'd order for insulin coverage. Encouraged to call for assistance as needed. Call light in reach.

## 2023-09-27 NOTE — Evaluation (Signed)
 Occupational Therapy Evaluation Patient Details Name: Joanna Sandoval MRN: [id number] DOB: 1945/06/15 Today's Date: 09/27/2023   History of Present Illness   Patient is a 78 y/o female admitted 09/24/23 with cough and acute respiratory failure with hypoxia requiring 4L O2, found to have parainfluenza virus, CXR negative for signs of pneumonia.  PMH positive for HTN, DM, HLD.     Clinical Impressions Pt reports at PLOF they live alone but has family who normally assist with groceries and md visits as they only drive in a very close proximity to their home. She was indep in ADLS but need increase in time to complete tasks. At this time she was able to complete UE dressing and bathing while sitting post set up and sit to stand with LE ADLS and use of RW.  At this time recommendation for [facility] services with the return to home. O2 on RA 86% at rest O2 on 2L at rest 88-89% O2 at 3L at rest and ADLS 90-94%     If plan is discharge home, recommend the following:   Assistance with cooking/housework;Assist for transportation;Help with stairs or ramp for entrance     Functional Status Assessment         Equipment Recommendations   None recommended by OT     Recommendations for Other Services         Precautions/Restrictions   Precautions Precautions: Fall Precaution/Restrictions Comments: watch O2 Restrictions Weight Bearing Restrictions Per Provider Order: No     Mobility Bed Mobility Overal bed mobility: Modified Independent Bed Mobility: Supine to Sit     Supine to sit: Modified independent (Device/Increase time)     General bed mobility comments: HOB elevated    Transfers Overall transfer level: Needs assistance Equipment used: Rolling walker (2 wheels) Transfers: Sit to/from Stand Sit to Stand: Supervision                  Balance Overall balance assessment: Needs assistance Sitting-balance support: No upper extremity supported Sitting  balance-Leahy Scale: Normal     Standing balance support: Bilateral upper extremity supported, No upper extremity supported Standing balance-Leahy Scale: Fair Standing balance comment: completed hygiene from BM in standing                           ADL either performed or assessed with clinical judgement   ADL Overall ADL's : Needs assistance/impaired Eating/Feeding: Independent;Sitting   Grooming: Wash/dry hands;Wash/dry face;Set up;Sitting   Upper Body Bathing: Set up;Sitting   Lower Body Bathing: Supervison/ safety;Sit to/from stand   Upper Body Dressing : Set up;Sitting   Lower Body Dressing: Supervision/safety;Sit to/from stand   Toilet Transfer: Supervision/safety;Rolling walker (2 wheels)   Toileting- Clothing Manipulation and Hygiene: Supervision/safety;Sit to/from stand       Functional mobility during ADLs: Supervision/safety;Rolling walker (2 wheels)       Vision   Vision Assessment?: Wears glasses for reading     Perception Perception: Within Functional Limits       Praxis Praxis: WFL       Pertinent Vitals/Pain Pain Assessment Pain Assessment: 0-10 Pain Score: 7  Pain Location: abdomen Pain Descriptors / Indicators: Aching Pain Intervention(s): Monitored during session     Extremity/Trunk Assessment Upper Extremity Assessment Upper Extremity Assessment: Generalized weakness   Lower Extremity Assessment Lower Extremity Assessment: Defer to PT evaluation       Communication Communication Communication: No apparent difficulties   Cognition Arousal: Alert Behavior During Therapy:  WFL for tasks assessed/performed                                 Following commands: Intact       Cueing  General Comments   Cueing Techniques: Verbal cues      Exercises     Shoulder Instructions      Home Living Family/patient expects to be discharged to:: (P) Private residence Living Arrangements: (P) Alone Available  Help at Discharge: (P) Family;Available PRN/intermittently Type of Home: (P) Apartment Home Access: (P) Stairs to enter Entrance Stairs-Number of Steps: (P) 3 Entrance Stairs-Rails: (P) Right Home Layout: (P) One level     Bathroom Shower/Tub: (P) Tub/shower unit;Curtain   Bathroom Toilet: (P) Standard     Home Equipment: (P) Grab bars - tub/shower;Shower seat;Cane - single point;Rollator (4 wheels)   Additional Comments: (P) Pt reported that they did not have AC for a couple of days and son is checking on it and reported it is back on. Her niece and sister assist some as well. Pt reports they sleep in recliner to about 2 am then goes to bed.      Prior Functioning/Environment Prior Level of Function : (P) Independent/Modified Independent;Needs assist;Driving (Pt reports they only drive very short distances)               ADLs Comments: (P) some help for grocery shopping and driving, grandaguhter assist with shoping but currently sick    OT Problem List:     OT Treatment/Interventions:        OT Goals(Current goals can be found in the care plan section)       OT Frequency:  Min 2X/week    Co-evaluation              AM-PAC OT 6 Clicks Daily Activity     Outcome Measure Help from another person eating meals?: None Help from another person taking care of personal grooming?: None Help from another person toileting, which includes using toliet, bedpan, or urinal?: None Help from another person bathing (including washing, rinsing, drying)?: A Little Help from another person to put on and taking off regular upper body clothing?: None Help from another person to put on and taking off regular lower body clothing?: None 6 Click Score: 23   End of Session Equipment Utilized During Treatment: Gait belt;Rolling walker (2 wheels) Nurse Communication: Mobility status  Activity Tolerance: Patient tolerated treatment well Patient left: in chair;with call bell/phone  within reach  OT Visit Diagnosis: Unsteadiness on feet (R26.81);Muscle weakness (generalized) (M62.81)                Time: 0725-0829 OT Time Calculation (min): 64 min Charges:  OT General Charges $OT Visit: 1 Visit OT Evaluation $OT Eval Low Complexity: 1 Low OT Treatments $Self Care/Home Management : 38-52 mins  Erving Heather OTR/L  Acute Rehab Services  [phone] office number   Stevphen Elders 09/27/2023, 10:35 AM

## 2023-09-27 NOTE — Progress Notes (Signed)
 Additional PT Note  SATURATION QUALIFICATIONS: (This note is used to comply with regulatory documentation for home oxygen)  Patient Saturations on Room Air at Rest = 90%  Patient Saturations on Room Air while Ambulating = 85%  Patient Saturations on 2 Liters of oxygen while Ambulating = 96%  Please briefly explain why patient needs home oxygen: pt with symptomatic O2 desat when ambulating on RA.   Amey Ka, PT  Acute Rehab Services Secure chat preferred Office [phone]

## 2023-09-27 NOTE — Progress Notes (Signed)
 SATURATION QUALIFICATIONS: (This note is used to comply with regulatory documentation for home oxygen)  Patient Saturations on Room Air at Rest = 90%  Patient Saturations on Room Air while Ambulating = 94-97%  Patient Saturations on 0 Liters of oxygen while Ambulating = 94%  Please briefly explain why patient needs home oxygen: Patient does not qualify for oxygen at this time

## 2023-09-27 NOTE — Progress Notes (Signed)
 Random check of pt's blood glucose:145mg /dl.

## 2023-09-27 NOTE — Inpatient Diabetes Management (Signed)
 Inpatient Diabetes Program Recommendations  AACE/ADA: New Consensus Statement on Inpatient Glycemic Control  Target Ranges:  Prepandial:   less than 140 mg/dL      Peak postprandial:   less than 180 mg/dL (1-2 hours)      Critically ill patients:  140 - 180 mg/dL    Review of Glycemic Control  Latest Reference Range & Units 09/26/23 08:23 09/26/23 12:14 09/26/23 15:56 09/26/23 21:41 09/26/23 21:44 09/27/23 05:45 09/27/23 08:14  Glucose-Capillary 70 - 99 mg/dL 213 (H) 086 (H) 578 (H) 432 (H) 411 (H) 145 (H) 220 (H)   Diabetes history: DM2 Outpatient Diabetes medications: Amaryl  2 mg BID, Jardiance  10 mg QHS, Farxiga  10 mg QAM Current orders for Inpatient glycemic control: Amaryl  2 mg QAM, Novolog 0-15 units TID with meals; Prednisone  40 mg QAM  Inpatient Diabetes Program Recommendations:    -   Add Novolog 4 units tid meal coverage if eating >50% of meals/supplements while on steroids  Thanks,  Eloise Hake RN, MSN, BC-ADM Inpatient Diabetes Coordinator Team Pager [phone] (8a-5p)

## 2023-09-27 NOTE — TOC Progression Note (Addendum)
 Transition of Care  [facility]) - Progression Note    Patient Details  Name: Joanna Sandoval MRN: [id number] Date of Birth: [birth year]  Transition of [facility]) CM/SW Contact  Jeani Mill, RN Phone Number: 09/27/2023, 2:39 PM  Clinical Narrative:    Patient agreeable to home health.  This RNCM offered choice for Home Health, Joanna Sandoval states she has no preference, RNCM made referral to [facility], She is able to take referral. Address, Phone number and PCP verified. TOC follow.   Need home health PT, OT ,aide orders  Expected Discharge Plan: Home w Home Health Services Barriers to Discharge: Continued Medical Work up  Expected Discharge Plan and Services       Living arrangements for the past 2 months: Apartment                           HH Arranged: PT, OT, Nurse's Aide HH Agency: [facility] Date HH Agency Contacted: 09/27/23 Time HH Agency Contacted: [phone] Representative spoke with at [facility] Agency: Imelda Man   Social Determinants of Health (SDOH) Interventions SDOH Screenings   Food Insecurity: No Food Insecurity (09/24/2023)  Housing: Low Risk  (09/24/2023)  Transportation Needs: No Transportation Needs (09/24/2023)  Utilities: Not At Risk (09/24/2023)  Alcohol Screen: Low Risk  (09/19/2023)  Depression (PHQ2-9): Low Risk  (09/19/2023)  Recent Concern: Depression (PHQ2-9) - High Risk (08/15/2023)  Financial Resource Strain: Low Risk  (09/19/2023)  Physical Activity: Inactive (09/19/2023)  Social Connections: Moderately Integrated (09/24/2023)  Recent Concern: Social Connections - Moderately Isolated (09/19/2023)  Stress: Stress Concern Present (09/19/2023)  Tobacco Use: Medium Risk (09/24/2023)  Health Literacy: Adequate Health Literacy (09/19/2023)    Readmission Risk Interventions     No data to display

## 2023-09-27 NOTE — Progress Notes (Signed)
 Physical Therapy Treatment Patient Details Name: Joanna Sandoval MRN: [id number] DOB: 18-Jul-1945 Today's Date: 09/27/2023   History of Present Illness Patient is a 78 y/o female admitted 09/24/23 with cough and acute respiratory failure with hypoxia requiring 4L O2, found to have parainfluenza virus, CXR negative for signs of pneumonia.  PMH positive for HTN, DM, HLD.    PT Comments  Pt received in recliner eating her lunch, she notes that she has had increased appetite lately. Pt ambulated with and without supplemental O2. Sats remain in low 90's on RA at rest but when she ambulates on RA she desats to 85% and gait becomes increasingly slow and pt seems slower to process mentally as well. On 2L O2 pt seemed more alert throughout gait and SPO2 remained in 90's. Discussed this with pt as well as the need to monitor O2 at home. Rec HHPT at d/c. PT will continue to follow.     If plan is discharge home, recommend the following: A little help with walking and/or transfers;A little help with bathing/dressing/bathroom;Help with stairs or ramp for entrance   Can travel by private vehicle        Equipment Recommendations  Rolling walker (2 wheels)    Recommendations for Other Services       Precautions / Restrictions Precautions Precautions: Fall Precaution/Restrictions Comments: watch O2 Restrictions Weight Bearing Restrictions Per Provider Order: No     Mobility  Bed Mobility               General bed mobility comments: pt received in recliner    Transfers Overall transfer level: Needs assistance Equipment used: Rolling walker (2 wheels) Transfers: Sit to/from Stand Sit to Stand: Supervision           General transfer comment: pt can verbalize that she should push from bed or chair but then puts hands on RW. Seems like processing deficits. At home, pt uses cane, not RW    Ambulation/Gait Ambulation/Gait assistance: Supervision Gait Distance (Feet): 150  Feet Assistive device: Rolling walker (2 wheels) Gait Pattern/deviations: Step-through pattern, Decreased stride length Gait velocity: decreased Gait velocity interpretation: <1.8 ft/sec, indicate of risk for recurrent falls   General Gait Details: very short steps, esp when ambulating on RA. SPO2 dropped to 85% on RA. Maintained in 90's on 2L O2   Stairs             Wheelchair Mobility     Tilt Bed    Modified Rankin (Stroke Patients Only)       Balance Overall balance assessment: Needs assistance Sitting-balance support: No upper extremity supported Sitting balance-Leahy Scale: Normal     Standing balance support: Bilateral upper extremity supported, No upper extremity supported Standing balance-Leahy Scale: Fair Standing balance comment: needs unilateral support for safety                            Communication Communication Communication: No apparent difficulties  Cognition Arousal: Alert Behavior During Therapy: WFL for tasks assessed/performed   PT - Cognitive impairments: Problem solving, Memory                       PT - Cognition Comments: slow processing and repeats herself throughout session Following commands: Intact      Cueing Cueing Techniques: Verbal cues  Exercises      General Comments General comments (skin integrity, edema, etc.): pt does not have pulse ox at home,  encouraged her to get one if possible. Educated on sxs of hypoxia. Pt seems more alert when has 2L O2 on.      Pertinent Vitals/Pain Pain Assessment Pain Assessment: Faces Faces Pain Scale: Hurts little more Pain Location: headache Pain Descriptors / Indicators: Headache Pain Intervention(s): Monitored during session    Home Living                          Prior Function            PT Goals (current goals can now be found in the care plan section) Acute Rehab PT Goals Patient Stated Goal: return to independent PT Goal  Formulation: With patient Time For Goal Achievement: 10/10/23 Potential to Achieve Goals: Good Progress towards PT goals: Progressing toward goals    Frequency    Min 2X/week      PT Plan      Co-evaluation              AM-PAC PT 6 Clicks Mobility   Outcome Measure  Help needed turning from your back to your side while in a flat bed without using bedrails?: None Help needed moving from lying on your back to sitting on the side of a flat bed without using bedrails?: A Little Help needed moving to and from a bed to a chair (including a wheelchair)?: A Little Help needed standing up from a chair using your arms (e.g., wheelchair or bedside chair)?: A Little Help needed to walk in hospital room?: A Little Help needed climbing 3-5 steps with a railing? : A Little 6 Click Score: 19    End of Session Equipment Utilized During Treatment: Gait belt;Oxygen Activity Tolerance: Patient tolerated treatment well Patient left: in chair;with call bell/phone within reach Nurse Communication: Mobility status PT Visit Diagnosis: Muscle weakness (generalized) (M62.81);Difficulty in walking, not elsewhere classified (R26.2)     Time: 1137-1208 PT Time Calculation (min) (ACUTE ONLY): 31 min  Charges:    $Gait Training: 23-37 mins PT General Charges $$ ACUTE PT VISIT: 1 Visit                     Amey Ka, PT  Acute Rehab Services Secure chat preferred Office [phone]    Deloris Fetters Yailene Badia 09/27/2023, 12:45 PM

## 2023-09-28 ENCOUNTER — Other Ambulatory Visit (HOSPITAL_COMMUNITY): Payer: Self-pay

## 2023-09-28 DIAGNOSIS — J9601 Acute respiratory failure with hypoxia: Secondary | ICD-10-CM | POA: Diagnosis not present

## 2023-09-28 LAB — MAGNESIUM: Magnesium: 2.1 mg/dL (ref 1.7–2.4)

## 2023-09-28 LAB — GLUCOSE, CAPILLARY
Glucose-Capillary: 147 mg/dL — ABNORMAL HIGH (ref 70–99)
Glucose-Capillary: 275 mg/dL — ABNORMAL HIGH (ref 70–99)

## 2023-09-28 LAB — BASIC METABOLIC PANEL WITH GFR
Anion gap: 7 (ref 5–15)
BUN: 21 mg/dL (ref 8–23)
CO2: 35 mmol/L — ABNORMAL HIGH (ref 22–32)
Calcium: 9.5 mg/dL (ref 8.9–10.3)
Chloride: 102 mmol/L (ref 98–111)
Creatinine, Ser: 0.74 mg/dL (ref 0.44–1.00)
GFR, Estimated: >60 mL/min (ref >60)
Glucose, Bld: 81 mg/dL (ref 70–99)
Potassium: 4.1 mmol/L (ref 3.5–5.1)
Sodium: 144 mmol/L (ref 135–145)

## 2023-09-28 LAB — CBC
HCT: 41.4 % (ref 36.0–46.0)
Hemoglobin: 12.8 g/dL (ref 12.0–15.0)
MCH: 28.1 pg (ref 26.0–34.0)
MCHC: 30.9 g/dL (ref 30.0–36.0)
MCV: 91 fL (ref 80.0–100.0)
Platelets: 230 10*3/uL (ref 150–400)
RBC: 4.55 MIL/uL (ref 3.87–5.11)
RDW: 18.6 % — ABNORMAL HIGH (ref 11.5–15.5)
WBC: 16.4 10*3/uL — ABNORMAL HIGH (ref 4.0–10.5)
nRBC: 0 % (ref 0.0–0.2)

## 2023-09-28 LAB — PHOSPHORUS: Phosphorus: 3.1 mg/dL (ref 2.5–4.6)

## 2023-09-28 MED ORDER — FLUTICASONE FUROATE-VILANTEROL 200-25 MCG/ACT IN AEPB
1.0000 | INHALATION_SPRAY | Freq: Every day | RESPIRATORY_TRACT | 11 refills | Status: AC
  Filled 2023-09-28: qty 60, 30d supply, fill #0

## 2023-09-28 MED ORDER — BENZONATATE 100 MG PO CAPS
100.0000 mg | ORAL_CAPSULE | Freq: Two times a day (BID) | ORAL | 0 refills | Status: AC | PRN
  Filled 2023-09-28: qty 20, 10d supply, fill #0

## 2023-09-28 MED ORDER — GUAIFENESIN-DM 100-10 MG/5ML PO SYRP
10.0000 mL | ORAL_SOLUTION | Freq: Four times a day (QID) | ORAL | 0 refills | Status: AC | PRN
  Filled 2023-09-28: qty 118, 3d supply, fill #0

## 2023-09-28 MED ORDER — ALBUTEROL SULFATE HFA 108 (90 BASE) MCG/ACT IN AERS
2.0000 | INHALATION_SPRAY | Freq: Four times a day (QID) | RESPIRATORY_TRACT | 1 refills | Status: DC | PRN
  Filled 2023-09-28: qty 18, 25d supply, fill #0

## 2023-09-28 NOTE — TOC Transition Note (Signed)
 Transition of Care [facility]) - Discharge Note   Patient Details  Name: Joanna Sandoval MRN: [id number] Date of Birth: 31-Aug-1945  [facility] CM/SW Contact:  Jeani Mill, RN Phone Number: 09/28/2023, 1:17 PM   Clinical Narrative:    Patient is stable for discharge.  Notified Lynette with [facility] of discharge. No other TOC needs at this time.  Final next level of care: Home w Home Health Services Barriers to Discharge: Barriers Resolved   Patient Goals and CMS Choice Patient states their goals for this hospitalization and ongoing recovery are:: Return home CMS Medicare.gov Compare Post Acute Care list provided to:: Patient Choice offered to / list presented to : Patient      Discharge Placement               Home        Discharge Plan and Services Additional resources added to the After Visit Summary for                            [facility] Arranged: PT, OT, Nurse's Aide [facility] Agency: [facility] Date [facility] Agency Contacted: 09/27/23 Time HH Agency Contacted: [phone] Representative spoke with at [facility] Agency: Imelda Man  Social Drivers of Health (SDOH) Interventions SDOH Screenings   Food Insecurity: No Food Insecurity (09/24/2023)  Housing: Low Risk  (09/24/2023)  Transportation Needs: No Transportation Needs (09/24/2023)  Utilities: Not At Risk (09/24/2023)  Alcohol Screen: Low Risk  (09/19/2023)  Depression (PHQ2-9): Low Risk  (09/19/2023)  Recent Concern: Depression (PHQ2-9) - High Risk (08/15/2023)  Financial Resource Strain: Low Risk  (09/19/2023)  Physical Activity: Inactive (09/19/2023)  Social Connections: Moderately Integrated (09/24/2023)  Recent Concern: Social Connections - Moderately Isolated (09/19/2023)  Stress: Stress Concern Present (09/19/2023)  Tobacco Use: Medium Risk (09/24/2023)  Health Literacy: Adequate Health Literacy (09/19/2023)     Readmission Risk Interventions    09/28/2023    1:17 PM  Readmission Risk Prevention Plan  Post Dischage Appt  Complete  Medication Screening Complete  Transportation Screening Complete

## 2023-09-28 NOTE — Discharge Summary (Signed)
 [facility] Discharge Summary   Patient: NIVEA WOJDYLA NWG:[id number]  PCP: Susanna Epley, FNP  Date of admission: 09/24/2023   Date of discharge: 09/28/2023     Discharge Diagnoses:  Principal Problem:   Acute hypoxemic respiratory failure (HCC)   Admitted From: Home Disposition:  Home with [facility]  Recommendations for Outpatient Follow-up:  F/u PCP in 1 wk F/u with Pulmo in 1-2 weeks for PFTs Follow up LABS/TEST:  PFTs   Follow-up Information     [facility] Follow up.   Specialty: Home Health Services Why: home health has been arranged. They will contact you to schedule apt within 48 hours post discharge. Contact information: [address] [place] [address] [phone]         Susanna Epley, FNP Follow up in 1 week(s).   Specialty: General Practice Contact information: [address] [place] [address] [phone]                Diet recommendation: Cardiac and Carb modified diet  Activity: The patient is advised to gradually reintroduce usual activities, as tolerated  Discharge Condition: stable  Code Status: Full code   History of present illness: As per the H and P dictated on admission.  Hospital Course:  RALPHINE HINKS is a 78 y.o. female with medical history significant of HTN, HLD, and DM2 p/w cough for weeks.  See H&P for full HPI on admission & ED course.   Patient was admitted on 09/24/2023 for further evaluation and management of acute respiratory failure with hypoxia requiring 4 L/min nasal cannula oxygen.  Patient was subsequently found to have Parainfluenza virus infection on full respiratory viral panel.  Chest x-ray negative for signs of pneumonia.     Assessment and Plan:   # Acute hypoxic respiratory failure: Resolved   Sepsis due to Parainfluenza virus infection Possible undiagnosed COPD (pt endorses 2pack/year x atleast 5 years, but quit years ago).  BNP normal.  No  PNA on CXR. Procal negative. Pt met for sepsis criteria on admission with fever, leukocytosis in setting of viral infection.  Normal lactic acid and BP's stable. S/p antibiotics in ED, no indication for further at this time S/p scheduled Duonebs, prednisone  40 mg p.o. daily for 3 days S/p Incentive spirometer and flutter valve, Mucinex  600 mg po BID S/p Supplement O2 inhalation, which was gradually weaned off currently saturating well on room air. On 6/11 started Breo Ellipta  inhaler due to persistent wheezing and shortness of breath.  Today patient is feeling better, plan is to discharge her home on Breo Ellipta  inhaler, albuterol  as needed, Robitussin DM as needed and Tessalon  Perles as needed.  Recommend to follow with PCP in 1 week and pulmonologist in 1 to 2 weeks for PFTs.   # Uncontrolled type 2 diabetes HbA1c is 10.2%, resumed glimepiride  2mg  po BID pta dose  S/p Sliding scale Novolog .  Resumed home regimen on discharge.  Patient was advised to monitor CBG at home, continue diabetic diet and follow with PCP for further management.   # HTN: Continue home losartan  and atenolol   # HLD: Continue home atorvastatin   # Generalized weakness could be secondary to viral infection Vitamin D  and B12 are within normal range # Elevated MCV, B12 and folate within normal range    Body mass index is 36.21 kg/m.  Nutrition Interventions:  - Patient was instructed, not to drive, operate heavy machinery, perform activities at heights, swimming or participation  in water activities or provide baby sitting services while on Pain, Sleep and Anxiety Medications; until her outpatient Physician has advised to do so again.  - Also recommended to not to take more than prescribed Pain, Sleep and Anxiety Medications.  Patient was seen by physical therapy, who recommended Home health, which was arranged. On the day of the discharge the patient's vitals were stable, and no other acute medical condition were  reported by patient. the patient was felt safe to be discharge at Home with Home health.  Consultants: None Procedures: None  Discharge Exam: General: Appear in no distress, no Rash; Oral Mucosa Clear, moist. Cardiovascular: S1 and S2 Present, no Murmur, Respiratory: Equal air entry bilaterally, mild bilateral crackles and mild wheezes  Abdomen: Bowel Sound present, Soft and no tenderness Extremities: no Pedal edema, no calf tenderness Neurology: alert and oriented to time, place, and person affect appropriate.  Filed Weights   09/24/23 0752  Weight: 89.8 kg   Vitals:   09/28/23 0804 09/28/23 0805  BP: (!) 139/56   Pulse: 75   Resp: 17   Temp: 98.2 F (36.8 C)   SpO2: (!) 88% 90%    DISCHARGE MEDICATION: Allergies as of 09/28/2023   No Known Allergies      Medication List     STOP taking these medications    diclofenac  sodium 1 % Gel Commonly known as: VOLTAREN    Farxiga  10 MG Tabs tablet Generic drug: dapagliflozin  propanediol       TAKE these medications    Accu-Chek FastClix Lancet Kit Use to test blood sugar twice daily as directed. E11.65   Accu-Chek Guide test strip Generic drug: glucose blood USE AS DIRECTED   Accu-Chek Guide w/Device Kit USE TO CHECK BLOOD SUGAR TWICE  DAILY AT 10AM AND 5 PM   Accu-Chek Softclix Lancets lancets USE AS DIRECTED   Adult Blood Pressure Cuff Lg Kit Take blood pressure daily and as needed   albuterol  108 (90 Base) MCG/ACT inhaler Commonly known as: VENTOLIN  HFA Inhale 2 puffs into the lungs every 6 (six) hours as needed.   atenolol  100 MG tablet Commonly known as: TENORMIN  Take 100 mg by mouth daily.   atorvastatin  40 MG tablet Commonly known as: LIPITOR Take 40 mg by mouth at bedtime.   benzonatate  100 MG capsule Commonly known as: TESSALON  Take 1 capsule (100 mg total) by mouth 2 (two) times daily as needed (Throat soothing).   D3-50 1.25 MG (50000 UT) capsule Generic drug: Cholecalciferol Take  50,000 Units by mouth once a week.   dorzolamide -timolol  2-0.5 % ophthalmic solution Commonly known as: COSOPT  Place 1 drop into both eyes 2 (two) times daily.   fluticasone  50 MCG/ACT nasal spray Commonly known as: FLONASE  Place 2 sprays into both nostrils daily. What changed:  when to take this reasons to take this   fluticasone  furoate-vilanterol 200-25 MCG/ACT Aepb Commonly known as: BREO ELLIPTA  Inhale 1 puff into the lungs daily. Start taking on: September 29, 2023   glimepiride  2 MG tablet Commonly known as: AMARYL  TAKE 1 TABLET BY MOUTH IN THE  MORNING AND AT BEDTIME   guaiFENesin -dextromethorphan  100-10 MG/5ML syrup Commonly known as: ROBITUSSIN DM Take 10 mLs by mouth every 6 (six) hours as needed for cough.   Jardiance  10 MG Tabs tablet Generic drug: empagliflozin  TAKE 1 TABLET(10 MG) BY MOUTH DAILY BEFORE BREAKFAST   latanoprost  0.005 % ophthalmic solution Commonly known as: XALATAN  Place 1 drop into both eyes at bedtime.   losartan  50  MG tablet Commonly known as: COZAAR  Take 50 mg by mouth daily.       No Known Allergies Discharge Instructions     Call MD for:  difficulty breathing, headache or visual disturbances   Complete by: As directed    Call MD for:  extreme fatigue   Complete by: As directed    Call MD for:  persistant dizziness or light-headedness   Complete by: As directed    Call MD for:  persistant nausea and vomiting   Complete by: As directed    Call MD for:  severe uncontrolled pain   Complete by: As directed    Call MD for:  temperature >100.4   Complete by: As directed    Diet - low sodium heart healthy   Complete by: As directed    Diet Carb Modified   Complete by: As directed    Discharge instructions   Complete by: As directed    F/u PCP in 1 wk F/u with Pulmo in 1-2 weeks for PFTs   Increase activity slowly   Complete by: As directed        The results of significant diagnostics from this hospitalization (including  imaging, microbiology, ancillary and laboratory) are listed below for reference.    Significant Diagnostic Studies: DG Chest Port 1 View Result Date: 09/24/2023 CLINICAL DATA:  Cough. EXAM: PORTABLE CHEST 1 VIEW COMPARISON:  11/21/2022. FINDINGS: Bilateral lung fields are clear. Bilateral costophrenic angles are clear. Normal cardio-mediastinal silhouette. No acute osseous abnormalities. The soft tissues are within normal limits. IMPRESSION: No active disease. Electronically Signed   By: Beula Brunswick M.D.   On: 09/24/2023 09:06    Microbiology: Recent Results (from the past 240 hours)  Resp panel by RT-PCR (RSV, Flu A&B, Covid) Anterior Nasal Swab     Status: None   Collection Time: 09/24/23  8:42 AM   Specimen: Anterior Nasal Swab  Result Value Ref Range Status   SARS Coronavirus 2 by RT PCR NEGATIVE NEGATIVE Final   Influenza A by PCR NEGATIVE NEGATIVE Final   Influenza B by PCR NEGATIVE NEGATIVE Final    Comment: (NOTE) The Xpert Xpress SARS-CoV-2/FLU/RSV plus assay is intended as an aid in the diagnosis of influenza from Nasopharyngeal swab specimens and should not be used as a sole basis for treatment. Nasal washings and aspirates are unacceptable for Xpert Xpress SARS-CoV-2/FLU/RSV testing.  Fact Sheet for Patients: BloggerCourse.com  Fact Sheet for Healthcare Providers: SeriousBroker.it  This test is not yet approved or cleared by the [place]  FDA and has been authorized for detection and/or diagnosis of SARS-CoV-2 by FDA under an Emergency Use Authorization (EUA). This EUA will remain in effect (meaning this test can be used) for the duration of the COVID-19 declaration under Section 564(b)(1) of the Act, 21 U.S.C. section 360bbb-3(b)(1), unless the authorization is terminated or revoked.     Resp Syncytial Virus by PCR NEGATIVE NEGATIVE Final    Comment: (NOTE) Fact Sheet for  Patients: BloggerCourse.com  Fact Sheet for Healthcare Providers: SeriousBroker.it  This test is not yet approved or cleared by the [place]  FDA and has been authorized for detection and/or diagnosis of SARS-CoV-2 by FDA under an Emergency Use Authorization (EUA). This EUA will remain in effect (meaning this test can be used) for the duration of the COVID-19 declaration under Section 564(b)(1) of the Act, 21 U.S.C. section 360bbb-3(b)(1), unless the authorization is terminated or revoked.  Performed at [facility], [address], [place],  [place] [address]   Culture, blood (single)     Status: None (Preliminary result)   Collection Time: 09/24/23  8:50 AM   Specimen: BLOOD LEFT ARM  Result Value Ref Range Status   Specimen Description BLOOD LEFT ARM  Final   Special Requests   Final    BOTTLES DRAWN AEROBIC ONLY Blood Culture results may not be optimal due to an inadequate volume of blood received in culture bottles   Culture   Final    NO GROWTH 4 DAYS Performed at [facility], [address] [place], [place] [address]    Report Status PENDING  Incomplete  Blood culture (routine x 2)     Status: None (Preliminary result)   Collection Time: 09/24/23  9:00 AM   Specimen: BLOOD RIGHT HAND  Result Value Ref Range Status   Specimen Description BLOOD RIGHT HAND  Final   Special Requests   Final    BOTTLES DRAWN AEROBIC AND ANAEROBIC Blood Culture adequate volume   Culture   Final    NO GROWTH 4 DAYS Performed at [facility], [address], [place], [place] [address]    Report Status PENDING  Incomplete  Respiratory (~20 pathogens) panel by PCR     Status: Abnormal   Collection Time: 09/24/23 12:58 PM   Specimen: Nasopharyngeal Swab; Respiratory  Result Value Ref Range Status   Adenovirus NOT DETECTED NOT DETECTED Final   Coronavirus 229E NOT DETECTED NOT DETECTED Final    Comment: (NOTE) The  Coronavirus on the Respiratory Panel, DOES NOT test for the novel  Coronavirus (2019 nCoV)    Coronavirus HKU1 NOT DETECTED NOT DETECTED Final   Coronavirus NL63 NOT DETECTED NOT DETECTED Final   Coronavirus OC43 NOT DETECTED NOT DETECTED Final   Metapneumovirus NOT DETECTED NOT DETECTED Final   Rhinovirus / Enterovirus NOT DETECTED NOT DETECTED Final   Influenza A NOT DETECTED NOT DETECTED Final   Influenza B NOT DETECTED NOT DETECTED Final   Parainfluenza Virus 1 NOT DETECTED NOT DETECTED Final   Parainfluenza Virus 2 NOT DETECTED NOT DETECTED Final   Parainfluenza Virus 3 DETECTED (A) NOT DETECTED Final   Parainfluenza Virus 4 NOT DETECTED NOT DETECTED Final   Respiratory Syncytial Virus NOT DETECTED NOT DETECTED Final   Bordetella pertussis NOT DETECTED NOT DETECTED Final   Bordetella Parapertussis NOT DETECTED NOT DETECTED Final   Chlamydophila pneumoniae NOT DETECTED NOT DETECTED Final   Mycoplasma pneumoniae NOT DETECTED NOT DETECTED Final    Comment: Performed at [facility], [address], [place], [place] [address]     Labs: CBC: Recent Labs  Lab 09/24/23 1328 09/25/23 0538 09/26/23 0451 09/27/23 0518 09/28/23 0612  WBC 11.6* 17.3* 14.1* 13.1* 16.4*  HGB 12.8 13.0 12.6 12.4 12.8  HCT 42.0 42.2 42.0 40.7 41.4  MCV 92.9 93.6 94.6 92.7 91.0  PLT 229 222 223 233 230   Basic Metabolic Panel: Recent Labs  Lab 09/25/23 0538 09/26/23 0451 09/26/23 2307 09/27/23 0518 09/28/23 0612  NA 140 142 138 142 144  K 4.5 4.7 4.6 4.1 4.1  CL 104 103 98 102 102  CO2 30 34* 34* 35* 35*  GLUCOSE 173* 211* 379* 158* 81  BUN 17 17 26* 25* 21  CREATININE 0.79 0.89 1.11* 0.77 0.74  CALCIUM  8.3* 9.1 9.3 9.5 9.5  MG  --  2.1  --  2.1 2.1  PHOS  --  3.1  --  3.1 3.1   Liver Function  Tests: Recent Labs  Lab 09/24/23 0806  AST 23  ALT 14  ALKPHOS 99  BILITOT 1.1  PROT 7.4  ALBUMIN 3.6   Recent Labs  Lab 09/24/23 0806  LIPASE 27   No results for input(s):  AMMONIA in the last 168 hours. Cardiac Enzymes: No results for input(s): CKTOTAL, CKMB, CKMBINDEX, TROPONINI in the last 168 hours. BNP (last 3 results) Recent Labs    09/24/23 1328  BNP 56.7   CBG: Recent Labs  Lab 09/27/23 0545 09/27/23 0814 09/27/23 1228 09/27/23 1624 09/28/23 0802  GLUCAP 145* 220* 230* 324* 147*    Time spent: 35 minutes  Signed:  Althia Atlas  [facility] 09/28/2023 11:06 AM

## 2023-09-28 NOTE — Progress Notes (Signed)
 Occupational Therapy Treatment Patient Details Name: Joanna Sandoval MRN: [id number] DOB: May 22, 1945 Today's Date: 09/28/2023   History of present illness Patient is a 78 y/o female admitted 09/24/23 with cough and acute respiratory failure with hypoxia requiring 4L O2, found to have parainfluenza virus, CXR negative for signs of pneumonia.  PMH positive for HTN, DM, HLD.   OT comments  Pt. Seen for skilled OT treatment session.  Pt. Able to demonstrate in room ambulation to/from b.room and to sink with S with RW.  Cues for RW management and counters.  Pt. Tolerated standing approx. 3 min. With no lob or c/o fatigue.  Simulated tub transfer with described grab bar and has a shower chair also.  Progressing well with acute OT goals. Cont. With acute OT POC.        If plan is discharge home, recommend the following:  Assistance with cooking/housework;Assist for transportation;Help with stairs or ramp for entrance   Equipment Recommendations  None recommended by OT    Recommendations for Other Services      Precautions / Restrictions Precautions Precautions: Fall Precaution/Restrictions Comments: watch O2       Mobility Bed Mobility               General bed mobility comments: pt received in recliner    Transfers Overall transfer level: Needs assistance Equipment used: Rolling walker (2 wheels) Transfers: Sit to/from Stand Sit to Stand: Supervision                 Balance                                           ADL either performed or assessed with clinical judgement   ADL Overall ADL's : Needs assistance/impaired     Grooming: Standing;Supervision/safety Grooming Details (indicate cue type and reason): stood at sink approx. 3 min., increasing activity tolerance reviewed rw management at counter         Upper Body Dressing : Set up;Sitting Upper Body Dressing Details (indicate cue type and reason): don clean gown, had spilled some  breakfast on current gown     Toilet Transfer: Supervision/safety;Rolling walker (2 wheels)       Tub/ Shower Transfer: Tub transfer;Rolling walker (2 wheels);Grab bars;Shower Field seismologist Details (indicate cue type and reason): pt. describes R faucet tub shower, states she steps over side step one foot at a time holding onto grab bar and sits on a shower chair. able to simulate and step over est. height. states on days when her knees are really bothering her she describes sitting on the chair more first vs stepping over the tub ledge Functional mobility during ADLs: Supervision/safety;Rolling walker (2 wheels)      Extremity/Trunk Assessment              Vision       Perception     Praxis     Communication Communication Communication: No apparent difficulties   Cognition Arousal: Alert Behavior During Therapy: WFL for tasks assessed/performed                                 Following commands: Intact        Cueing   Cueing Techniques: Verbal cues  Exercises      Shoulder Instructions  General Comments      Pertinent Vitals/ Pain       Pain Assessment Pain Assessment: No/denies pain  Home Living                                          Prior Functioning/Environment              Frequency  Min 2X/week        Progress Toward Goals  OT Goals(current goals can now be found in the care plan section)  Progress towards OT goals: Progressing toward goals     Plan      Co-evaluation                 AM-PAC OT 6 Clicks Daily Activity     Outcome Measure   Help from another person eating meals?: None Help from another person taking care of personal grooming?: None Help from another person toileting, which includes using toliet, bedpan, or urinal?: None Help from another person bathing (including washing, rinsing, drying)?: A Little Help from another person to put on and taking off  regular upper body clothing?: None Help from another person to put on and taking off regular lower body clothing?: None 6 Click Score: 23    End of Session Equipment Utilized During Treatment: Rolling walker (2 wheels)  OT Visit Diagnosis: Unsteadiness on feet (R26.81);Muscle weakness (generalized) (M62.81)   Activity Tolerance Patient tolerated treatment well   Patient Left in chair;with call bell/phone within reach   Nurse Communication Other (comment) (rn states ok to work with pt)        Time: 1610-9604 OT Time Calculation (min): 14 min  Charges: OT General Charges $OT Visit: 1 Visit OT Treatments $Self Care/Home Management : 8-22 mins  Howell Macintosh, COTA/L Acute Rehabilitation [phone]   Leory Rands [place]-COTA/L  09/28/2023, 11:06 AM

## 2023-09-28 NOTE — Plan of Care (Signed)
 Problem: Education: Goal: Ability to describe self-care measures that may prevent or decrease complications (Diabetes Survival Skills Education) will improve 09/28/2023 1315 by Maudie Sorrow, RN Outcome: Adequate for Discharge 09/28/2023 1314 by Maudie Sorrow, RN Outcome: Progressing Goal: Individualized Educational Video(s) 09/28/2023 1315 by Maudie Sorrow, RN Outcome: Adequate for Discharge 09/28/2023 1314 by Maudie Sorrow, RN Outcome: Progressing   Problem: Coping: Goal: Ability to adjust to condition or change in health will improve 09/28/2023 1315 by Maudie Sorrow, RN Outcome: Adequate for Discharge 09/28/2023 1314 by Kevan Prouty, Rozell Cornet, RN Outcome: Progressing   Problem: Fluid Volume: Goal: Ability to maintain a balanced intake and output will improve 09/28/2023 1315 by Maudie Sorrow, RN Outcome: Adequate for Discharge 09/28/2023 1314 by Maudie Sorrow, RN Outcome: Progressing   Problem: Health Behavior/Discharge Planning: Goal: Ability to identify and utilize available resources and services will improve 09/28/2023 1315 by Maudie Sorrow, RN Outcome: Adequate for Discharge 09/28/2023 1314 by Maudie Sorrow, RN Outcome: Progressing Goal: Ability to manage health-related needs will improve 09/28/2023 1315 by Maudie Sorrow, RN Outcome: Adequate for Discharge 09/28/2023 1314 by Mylene Bow, Rozell Cornet, RN Outcome: Progressing   Problem: Metabolic: Goal: Ability to maintain appropriate glucose levels will improve 09/28/2023 1315 by Maudie Sorrow, RN Outcome: Adequate for Discharge 09/28/2023 1314 by Maudie Sorrow, RN Outcome: Progressing   Problem: Nutritional: Goal: Maintenance of adequate nutrition will improve 09/28/2023 1315 by Maudie Sorrow, RN Outcome: Adequate for Discharge 09/28/2023 1314 by Maudie Sorrow, RN Outcome: Progressing Goal: Progress toward achieving  an optimal weight will improve 09/28/2023 1315 by Maudie Sorrow, RN Outcome: Adequate for Discharge 09/28/2023 1314 by Hayato Guaman, Rozell Cornet, RN Outcome: Progressing   Problem: Skin Integrity: Goal: Risk for impaired skin integrity will decrease 09/28/2023 1315 by Maudie Sorrow, RN Outcome: Adequate for Discharge 09/28/2023 1314 by Maudie Sorrow, RN Outcome: Progressing   Problem: Tissue Perfusion: Goal: Adequacy of tissue perfusion will improve 09/28/2023 1315 by Maudie Sorrow, RN Outcome: Adequate for Discharge 09/28/2023 1314 by Maudie Sorrow, RN Outcome: Progressing   Problem: Education: Goal: Knowledge of General Education information will improve Description: Including pain rating scale, medication(s)/side effects and non-pharmacologic comfort measures 09/28/2023 1315 by Maudie Sorrow, RN Outcome: Adequate for Discharge 09/28/2023 1314 by Maudie Sorrow, RN Outcome: Progressing   Problem: Health Behavior/Discharge Planning: Goal: Ability to manage health-related needs will improve 09/28/2023 1315 by Maudie Sorrow, RN Outcome: Adequate for Discharge 09/28/2023 1314 by Rhianne Soman, Rozell Cornet, RN Outcome: Progressing   Problem: Clinical Measurements: Goal: Ability to maintain clinical measurements within normal limits will improve 09/28/2023 1315 by Elber Galyean, Rozell Cornet, RN Outcome: Adequate for Discharge 09/28/2023 1314 by Maudie Sorrow, RN Outcome: Progressing Goal: Will remain free from infection 09/28/2023 1315 by Yarissa Reining, Rozell Cornet, RN Outcome: Adequate for Discharge 09/28/2023 1314 by Maudie Sorrow, RN Outcome: Progressing Goal: Diagnostic test results will improve 09/28/2023 1315 by Maudie Sorrow, RN Outcome: Adequate for Discharge 09/28/2023 1314 by Maudie Sorrow, RN Outcome: Progressing Goal: Respiratory complications will improve 09/28/2023 1315 by Maudie Sorrow,  RN Outcome: Adequate for Discharge 09/28/2023 1314 by Maudie Sorrow, RN Outcome: Progressing Goal: Cardiovascular complication will be avoided 09/28/2023 1315 by Shene Maxfield L, RN Outcome: Adequate for Discharge 09/28/2023 1314 by Clair Alfieri, Rozell Cornet, RN Outcome: Progressing   Problem: Activity: Goal: Risk for activity intolerance will decrease 09/28/2023 1315 by Talita Recht L, RN Outcome: Adequate for Discharge  09/28/2023 1314 by Maudie Sorrow, RN Outcome: Progressing   Problem: Nutrition: Goal: Adequate nutrition will be maintained 09/28/2023 1315 by Maudie Sorrow, RN Outcome: Adequate for Discharge 09/28/2023 1314 by Juliann Olesky L, RN Outcome: Progressing   Problem: Coping: Goal: Level of anxiety will decrease 09/28/2023 1315 by Maudie Sorrow, RN Outcome: Adequate for Discharge 09/28/2023 1314 by Tamikia Chowning, Rozell Cornet, RN Outcome: Progressing   Problem: Elimination: Goal: Will not experience complications related to bowel motility 09/28/2023 1315 by Maudie Sorrow, RN Outcome: Adequate for Discharge 09/28/2023 1314 by Maudie Sorrow, RN Outcome: Progressing Goal: Will not experience complications related to urinary retention 09/28/2023 1315 by Maudie Sorrow, RN Outcome: Adequate for Discharge 09/28/2023 1314 by Davonne Jarnigan L, RN Outcome: Progressing   Problem: Pain Managment: Goal: General experience of comfort will improve and/or be controlled 09/28/2023 1315 by Maudie Sorrow, RN Outcome: Adequate for Discharge 09/28/2023 1314 by Helia Haese, Rozell Cornet, RN Outcome: Progressing   Problem: Safety: Goal: Ability to remain free from injury will improve 09/28/2023 1315 by Jarnell Cordaro, Rozell Cornet, RN Outcome: Adequate for Discharge 09/28/2023 1314 by Miral Hoopes L, RN Outcome: Progressing   Problem: Skin Integrity: Goal: Risk for impaired skin integrity will decrease 09/28/2023  1315 by Nakaya Mishkin, Rozell Cornet, RN Outcome: Adequate for Discharge 09/28/2023 1314 by Farooq Petrovich, Rozell Cornet, RN Outcome: Progressing   Problem: Acute Rehab PT Goals(only PT should resolve) Goal: Pt Will Go Supine/Side To Sit Outcome: Adequate for Discharge Goal: Patient Will Transfer Sit To/From Stand Outcome: Adequate for Discharge Goal: Pt Will Perform Standing Balance Or Pre-Gait Outcome: Adequate for Discharge Goal: Pt Will Ambulate Outcome: Adequate for Discharge Goal: Pt Will Go Up/Down Stairs Outcome: Adequate for Discharge   Problem: Acute Rehab OT Goals (only OT should resolve) Goal: Pt. Will Transfer To Toilet Outcome: Adequate for Discharge Goal: Pt. Will Perform Tub/Shower Transfer Outcome: Adequate for Discharge Goal: OT Additional ADL Goal #1 Outcome: Adequate for Discharge

## 2023-09-28 NOTE — Inpatient Diabetes Management (Signed)
 Inpatient Diabetes Program Recommendations  AACE/ADA: New Consensus Statement on Inpatient Glycemic Control   Target Ranges:  Prepandial:   less than 140 mg/dL      Peak postprandial:   less than 180 mg/dL (1-2 hours)      Critically ill patients:  140 - 180 mg/dL     Review of Glycemic Control  Diabetes history: DM2 Outpatient Diabetes medications: Amaryl  2 mg BID, Jardiance  10 mg QHS, Farxiga  10 mg QAM Current orders for Inpatient glycemic control: Amaryl  2 mg BID, Novolog  0-15 units TID with meals   Inpatient Diabetes Program Recommendations:     Insulin : Last dose of Prednisone  40 mg given this morning. Please consider ordering Novolog  3 units TID with meals for meal coverage if patient eats at least 50% of meals.  HbgA1C:  A1C 10.2% on 09/24/23 indicating an average glucose of 248 mg/dl over the past 2-3 months. Patient reports she takes Jardiance  10 mg at bedtime and Farxiga  10 mg QAM, and Amaryl  2 mg BID. Patient should not be taking both Jardiance  and Farxiga  (as both are in the same drug class).  Patient is NOT willing to take any type of injection (insulin  nor GLP-1).  At time of discharge, would recommend to clarify SGLT2 medication (may want to discontinue Farxiga  and change Jardiance  to 25 mg daily) add Tradjenta 5 mg daily.  Thanks, Beacher Limerick, RN, MSN, CDCES Diabetes Coordinator Inpatient Diabetes Program [phone] (Team Pager from 8am to 5pm)

## 2023-09-28 NOTE — Plan of Care (Signed)
  Problem: Education: Goal: Ability to describe self-care measures that may prevent or decrease complications (Diabetes Survival Skills Education) will improve Outcome: Progressing Goal: Individualized Educational Video(s) Outcome: Progressing   Problem: Coping: Goal: Ability to adjust to condition or change in health will improve Outcome: Progressing   Problem: Fluid Volume: Goal: Ability to maintain a balanced intake and output will improve Outcome: Progressing   Problem: Health Behavior/Discharge Planning: Goal: Ability to identify and utilize available resources and services will improve Outcome: Progressing Goal: Ability to manage health-related needs will improve Outcome: Progressing   Problem: Clinical Measurements: Goal: Ability to maintain clinical measurements within normal limits will improve Outcome: Progressing Goal: Will remain free from infection Outcome: Progressing Goal: Diagnostic test results will improve Outcome: Progressing Goal: Respiratory complications will improve Outcome: Progressing Goal: Cardiovascular complication will be avoided Outcome: Progressing   Problem: Nutrition: Goal: Adequate nutrition will be maintained Outcome: Progressing   Problem: Coping: Goal: Level of anxiety will decrease Outcome: Progressing   Problem: Elimination: Goal: Will not experience complications related to bowel motility Outcome: Progressing Goal: Will not experience complications related to urinary retention Outcome: Progressing

## 2023-09-29 LAB — CULTURE, BLOOD (ROUTINE X 2)
Culture: NO GROWTH
Special Requests: ADEQUATE

## 2023-09-29 LAB — CULTURE, BLOOD (SINGLE): Culture: NO GROWTH

## 2023-10-01 ENCOUNTER — Telehealth: Payer: Self-pay | Admitting: *Deleted

## 2023-10-01 NOTE — Transitions of Care (Post Inpatient/ED Visit) (Signed)
   10/01/2023  Name: Joanna Sandoval MRN: [id number] DOB: March 11, 1946  Today's TOC FU Call Status: Today's TOC FU Call Status:: Unsuccessful Call (1st Attempt) Unsuccessful Call (1st Attempt) Date: 10/01/23  Attempted to reach the patient regarding the most recent Inpatient/ED visit.  [name] is unable to take this telephone call and request a call back in an hour.  Follow Up Plan: Additional outreach attempts will be made to reach the patient to complete the Transitions of Care (Post Inpatient/ED visit) call.   Arna Better RN, BSN [place]  [facility] Health RN Care Manager [phone]

## 2023-10-01 NOTE — Progress Notes (Unsigned)
 Complex Care Management Care Guide Note  10/01/2023 Name: [name] MRN: [id number] DOB: Nov 27, 1945  Joanna Sandoval is a 78 y.o. year old female who is a primary care patient of Susanna Epley, FNP and is actively engaged with the care management team. I reached out to Loman Risk by phone today to assist with re-scheduling  with the RN Case Manager.  Follow up plan: Unsuccessful telephone outreach attempt made. A HIPAA compliant phone message was left for the patient providing contact information and requesting a return call.  Barnie Bora  [facility] Health  [facility], [facility] Guide  Direct Dial: [phone]  Fax [phone]

## 2023-10-01 NOTE — Transitions of Care (Post Inpatient/ED Visit) (Signed)
 10/01/2023  Name: Joanna Sandoval MRN: [id number] DOB: 1945/06/08  Today's TOC FU Call Status: Today's TOC FU Call Status:: Successful TOC FU Call Completed TOC FU Call Complete Date: 10/01/23 Patient's Name and Date of Birth confirmed.  Transition Care Management Follow-up Telephone Call Date of Discharge: 09/28/23 Discharge Facility: [facility] [place]) Type of Discharge: Inpatient Admission Primary Inpatient Discharge Diagnosis:: Acute hypoxemic respiratory failure How have you been since you were released from the hospital?: Same Any questions or concerns?: No  Items Reviewed: Did you receive and understand the discharge instructions provided?: Yes Medications obtained,verified, and reconciled?: Yes (Medications Reviewed) Any new allergies since your discharge?: No Dietary orders reviewed?: Yes Type of Diet Ordered:: Cardiac and Carb modified diet Do you have support at home?: No  Medications Reviewed Today: Medications Reviewed Today     Reviewed by Aura Leeds, RN (Registered Nurse) on 10/01/23 at 1049  Med List Status: <None>   Medication Order Taking? Sig Documenting Provider Last Dose Status Informant  Accu-Chek Softclix Lancets lancets [id number]  USE AS DIRECTED  Patient not taking: Reported on 10/01/2023   Susanna Epley, FNP  Active Self, Pharmacy Records  albuterol  (VENTOLIN  HFA) 108 [phone] Base) MCG/ACT inhaler [id number] Yes Inhale 2 puffs into the lungs every 6 (six) hours as needed. Althia Atlas, MD  Active   atenolol  (TENORMIN ) 100 MG tablet [id number] Yes Take 100 mg by mouth daily. [provider]  Active Self, Pharmacy Records  atorvastatin  (LIPITOR) 40 MG tablet [id number] Yes Take 40 mg by mouth at bedtime. [provider]  Active Self, Pharmacy Records  benzonatate  (TESSALON ) 100 MG capsule [id number] Yes Take 1 capsule (100 mg total) by mouth 2 (two) times daily as needed (Throat soothing). Althia Atlas, MD  Active   Blood Glucose Monitoring  Suppl (ACCU-CHEK GUIDE) w/Device KIT [id number]  USE TO CHECK BLOOD SUGAR TWICE  DAILY AT 10AM AND 5 PM  Patient not taking: Reported on 10/01/2023   Susanna Epley, FNP  Active Self, Pharmacy Records  Blood Pressure Monitoring (ADULT BLOOD PRESSURE CUFF LG) KIT [id number]  Take blood pressure daily and as needed  Patient not taking: Reported on 10/01/2023   Susanna Epley, FNP  Active Self, Pharmacy Records  D3-50 50000 units capsule [id number] Yes Take 50,000 Units by mouth once a week. [provider]  Active Self, Pharmacy Records  dorzolamide -timolol  (COSOPT ) 2-0.5 % ophthalmic solution [id number] Yes Place 1 drop into both eyes 2 (two) times daily. [provider]  Active Self, Pharmacy Records  fluticasone  (FLONASE ) 50 MCG/ACT nasal spray [id number] Yes Place 2 sprays into both nostrils daily. Susanna Epley, FNP  Active Self, Pharmacy Records  fluticasone  furoate-vilanterol (BREO ELLIPTA ) 200-25 MCG/ACT AEPB [id number] Yes Inhale 1 puff into the lungs daily. Althia Atlas, MD  Active   glimepiride  (AMARYL ) 2 MG tablet [id number] Yes TAKE 1 TABLET BY MOUTH IN THE  MORNING AND AT BEDTIME Susanna Epley, FNP  Active Self, Pharmacy Records  glucose blood (ACCU-CHEK GUIDE) test strip [id number]  USE AS DIRECTED  Patient not taking: Reported on 10/01/2023   Susanna Epley, FNP  Active Self, Pharmacy Records  guaiFENesin -dextromethorphan  [facility] DM) 100-10 MG/5ML syrup [id number] Yes Take 10 mLs by mouth every 6 (six) hours as needed for cough. Althia Atlas, MD  Active   JARDIANCE  10 MG TABS tablet [id number] Yes TAKE 1 TABLET(10 MG) BY MOUTH DAILY BEFORE BREAKFAST Susanna Epley, FNP  Active Self, Pharmacy Records  [name] KIT [id number]  Use to  test blood sugar twice daily as directed. E11.65  Patient not taking: Reported on 10/01/2023   Susanna Epley, FNP  Active Self, Pharmacy Records  latanoprost  (XALATAN ) 0.005 % ophthalmic solution [id number] Yes Place 1  drop into both eyes at bedtime. [provider]  Active Self, Pharmacy Records  losartan  (COZAAR ) 50 MG tablet [id number] Yes Take 50 mg by mouth daily. [provider]  Active Self, Pharmacy Records            Home Care and Equipment/Supplies: Were Home Health Services Ordered?: Yes Name of Home Health Agency:: [place] for PT/OT/Nurse Aide [phone] Has Agency set up a time to come to your home?: No EMR reviewed for Home Health Orders:  (RNCM placed call to [facility] [phone], left message) Any new equipment or medical supplies ordered?: No  Functional Questionnaire: Do you need assistance with bathing/showering or dressing?: No Do you need assistance with meal preparation?: Yes Do you need assistance with eating?: No Do you have difficulty maintaining continence: Yes (wears briefs) Do you need assistance with getting out of bed/getting out of a chair/moving?: No Do you have difficulty managing or taking your medications?: No  Follow up appointments reviewed: PCP Follow-up appointment confirmed?: No (Patient has called and waiting on return call to schedule) MD Provider Line Number:[phone] Given: No Specialist Hospital Follow-up appointment confirmed?: NA Do you need transportation to your follow-up appointment?: No Do you understand care options if your condition(s) worsen?: Yes-patient verbalized understanding  SDOH Interventions Today    Flowsheet Row Most Recent Value  SDOH Interventions   Food Insecurity Interventions Intervention Not Indicated  Housing Interventions Intervention Not Indicated  Transportation Interventions Intervention Not Indicated  Utilities Interventions Intervention Not Indicated    Goals Addressed             This Visit's Progress    VBCI Transitions of Care (TOC) Care Plan       Problems:  Recent Hospitalization for treatment of Respiratory Failure Home Health services barrier: Patient has not been  contacted for [facility] PT/OT/PCS and Knowledge Deficit Related to Respiratory Failure  Goal:  Over the next 30 days, the patient will not experience hospital readmission  Interventions:  Transitions of Care: Doctor Visits  - discussed the importance of doctor visits Contacted Health RN/OT/PT - Call placed to [facility] [phone], left message requesting update on scheduling home visit Reviewed Signs and symptoms of infection Discussed scheduling PCP hospital follow up-patient has contacted the office and is waiting on a call back for scheduling Discussed the importance of BS management, advised patient to locate glucometer and start checking BS as instructed Medication review, discussed the option of adherence packaging available at certain pharmacies-patient will consider Education on heart healthy, carb modified diet, examples given   Patient Self Care Activities:  Attend all scheduled provider appointments Call provider office for new concerns or questions  Participate in Transition of Care Program/Attend TOC scheduled calls Take medications as prescribed   identify and remove indoor air pollutants listen for public air quality announcements every day Avoid going out in the heat of the day  Plan:  Telephone follow up appointment with care management team member scheduled for:  10/09/23 at 10:30am        Arna Better RN, BSN [place]  [facility] Health RN Care Manager [phone]

## 2023-10-02 ENCOUNTER — Telehealth

## 2023-10-02 DIAGNOSIS — M1990 Unspecified osteoarthritis, unspecified site: Secondary | ICD-10-CM | POA: Diagnosis not present

## 2023-10-02 DIAGNOSIS — Z7984 Long term (current) use of oral hypoglycemic drugs: Secondary | ICD-10-CM | POA: Diagnosis not present

## 2023-10-02 DIAGNOSIS — G8929 Other chronic pain: Secondary | ICD-10-CM | POA: Diagnosis not present

## 2023-10-02 DIAGNOSIS — M6281 Muscle weakness (generalized): Secondary | ICD-10-CM | POA: Diagnosis not present

## 2023-10-02 DIAGNOSIS — M109 Gout, unspecified: Secondary | ICD-10-CM | POA: Diagnosis not present

## 2023-10-02 DIAGNOSIS — I119 Hypertensive heart disease without heart failure: Secondary | ICD-10-CM | POA: Diagnosis not present

## 2023-10-02 DIAGNOSIS — Z7951 Long term (current) use of inhaled steroids: Secondary | ICD-10-CM | POA: Diagnosis not present

## 2023-10-02 DIAGNOSIS — H6123 Impacted cerumen, bilateral: Secondary | ICD-10-CM | POA: Diagnosis not present

## 2023-10-02 DIAGNOSIS — Z9981 Dependence on supplemental oxygen: Secondary | ICD-10-CM | POA: Diagnosis not present

## 2023-10-02 DIAGNOSIS — J302 Other seasonal allergic rhinitis: Secondary | ICD-10-CM | POA: Diagnosis not present

## 2023-10-02 DIAGNOSIS — E7800 Pure hypercholesterolemia, unspecified: Secondary | ICD-10-CM | POA: Diagnosis not present

## 2023-10-02 DIAGNOSIS — E1165 Type 2 diabetes mellitus with hyperglycemia: Secondary | ICD-10-CM | POA: Diagnosis not present

## 2023-10-02 DIAGNOSIS — H401131 Primary open-angle glaucoma, bilateral, mild stage: Secondary | ICD-10-CM | POA: Diagnosis not present

## 2023-10-02 DIAGNOSIS — B348 Other viral infections of unspecified site: Secondary | ICD-10-CM | POA: Diagnosis not present

## 2023-10-02 DIAGNOSIS — A4189 Other specified sepsis: Secondary | ICD-10-CM | POA: Diagnosis not present

## 2023-10-02 DIAGNOSIS — I700 Atherosclerosis of aorta: Secondary | ICD-10-CM | POA: Diagnosis not present

## 2023-10-02 DIAGNOSIS — H9192 Unspecified hearing loss, left ear: Secondary | ICD-10-CM | POA: Diagnosis not present

## 2023-10-02 DIAGNOSIS — E559 Vitamin D deficiency, unspecified: Secondary | ICD-10-CM | POA: Diagnosis not present

## 2023-10-02 DIAGNOSIS — M25561 Pain in right knee: Secondary | ICD-10-CM | POA: Diagnosis not present

## 2023-10-02 DIAGNOSIS — Z79899 Other long term (current) drug therapy: Secondary | ICD-10-CM | POA: Diagnosis not present

## 2023-10-02 DIAGNOSIS — E1151 Type 2 diabetes mellitus with diabetic peripheral angiopathy without gangrene: Secondary | ICD-10-CM | POA: Diagnosis not present

## 2023-10-02 DIAGNOSIS — M25562 Pain in left knee: Secondary | ICD-10-CM | POA: Diagnosis not present

## 2023-10-03 ENCOUNTER — Telehealth: Payer: Self-pay

## 2023-10-03 NOTE — Telephone Encounter (Signed)
 Patient was identified as falling into the [place] Measure - Diabetes.   Patient was: Appointment already scheduled for:  10/11/2023.

## 2023-10-09 ENCOUNTER — Other Ambulatory Visit: Payer: Self-pay | Admitting: *Deleted

## 2023-10-09 DIAGNOSIS — E1165 Type 2 diabetes mellitus with hyperglycemia: Secondary | ICD-10-CM | POA: Diagnosis not present

## 2023-10-09 DIAGNOSIS — H401131 Primary open-angle glaucoma, bilateral, mild stage: Secondary | ICD-10-CM | POA: Diagnosis not present

## 2023-10-09 DIAGNOSIS — H6123 Impacted cerumen, bilateral: Secondary | ICD-10-CM | POA: Diagnosis not present

## 2023-10-09 DIAGNOSIS — G8929 Other chronic pain: Secondary | ICD-10-CM | POA: Diagnosis not present

## 2023-10-09 DIAGNOSIS — I700 Atherosclerosis of aorta: Secondary | ICD-10-CM | POA: Diagnosis not present

## 2023-10-09 DIAGNOSIS — B348 Other viral infections of unspecified site: Secondary | ICD-10-CM | POA: Diagnosis not present

## 2023-10-09 DIAGNOSIS — M6281 Muscle weakness (generalized): Secondary | ICD-10-CM | POA: Diagnosis not present

## 2023-10-09 DIAGNOSIS — Z79899 Other long term (current) drug therapy: Secondary | ICD-10-CM | POA: Diagnosis not present

## 2023-10-09 DIAGNOSIS — E559 Vitamin D deficiency, unspecified: Secondary | ICD-10-CM | POA: Diagnosis not present

## 2023-10-09 DIAGNOSIS — E1151 Type 2 diabetes mellitus with diabetic peripheral angiopathy without gangrene: Secondary | ICD-10-CM | POA: Diagnosis not present

## 2023-10-09 DIAGNOSIS — M1990 Unspecified osteoarthritis, unspecified site: Secondary | ICD-10-CM | POA: Diagnosis not present

## 2023-10-09 DIAGNOSIS — Z9981 Dependence on supplemental oxygen: Secondary | ICD-10-CM | POA: Diagnosis not present

## 2023-10-09 DIAGNOSIS — A4189 Other specified sepsis: Secondary | ICD-10-CM | POA: Diagnosis not present

## 2023-10-09 DIAGNOSIS — J302 Other seasonal allergic rhinitis: Secondary | ICD-10-CM | POA: Diagnosis not present

## 2023-10-09 DIAGNOSIS — I119 Hypertensive heart disease without heart failure: Secondary | ICD-10-CM | POA: Diagnosis not present

## 2023-10-09 DIAGNOSIS — H9192 Unspecified hearing loss, left ear: Secondary | ICD-10-CM | POA: Diagnosis not present

## 2023-10-09 DIAGNOSIS — E7800 Pure hypercholesterolemia, unspecified: Secondary | ICD-10-CM | POA: Diagnosis not present

## 2023-10-09 DIAGNOSIS — Z7984 Long term (current) use of oral hypoglycemic drugs: Secondary | ICD-10-CM | POA: Diagnosis not present

## 2023-10-09 DIAGNOSIS — Z7951 Long term (current) use of inhaled steroids: Secondary | ICD-10-CM | POA: Diagnosis not present

## 2023-10-09 DIAGNOSIS — M25561 Pain in right knee: Secondary | ICD-10-CM | POA: Diagnosis not present

## 2023-10-09 DIAGNOSIS — M109 Gout, unspecified: Secondary | ICD-10-CM | POA: Diagnosis not present

## 2023-10-09 DIAGNOSIS — M25562 Pain in left knee: Secondary | ICD-10-CM | POA: Diagnosis not present

## 2023-10-09 NOTE — Patient Instructions (Signed)
 Visit Information  Thank you for taking time to visit with me today. Please don't hesitate to contact me if I can be of assistance to you before our next scheduled telephone appointment.  Our next appointment is by telephone on 10/17/23 at 10:30am  Following is a copy of your care plan:   Goals Addressed             This Visit's Progress    VBCI Transitions of Care (TOC) Care Plan       Problems:  Recent Hospitalization for treatment of Respiratory Failure Home Health services barrier: Patient has not been contacted for [facility] PT/OT/PCS and Knowledge Deficit Related to Respiratory Failure  Goal:  Over the next 30 days, the patient will not experience hospital readmission  Interventions:  Transitions of Care: Doctor Visits  - discussed the importance of doctor visits Contacted Health RN/OT/PT - verified patient is receiving [facility] services Reviewed Signs and symptoms of infection Discussed scheduling PCP hospital follow up-scheduled on 10/11/23, verified patient has transportation Discussed the importance of BS management-verified patient found glucometer and is now checking BS twice daily Medication review, discussed the option of adherence packaging available at certain pharmacies-revisited, patient declines Reviewed infection prevention Collaborated with PCP via secure communication to request a pulse oximeter Encouraged patient to do breathing exercises daily, stay indoors during the heat of the day    Patient Self Care Activities:  Attend all scheduled provider appointments Call provider office for new concerns or questions  Participate in Transition of Care Program/Attend TOC scheduled calls Take medications as prescribed   identify and remove indoor air pollutants listen for public air quality announcements every day Avoid going out in the heat of the day  Plan:  Telephone follow up appointment with care management team member scheduled for:  10/17/23 at 10:30am         Patient verbalizes understanding of instructions and care plan provided today and agrees to view in MyChart. Active MyChart status and patient understanding of how to access instructions and care plan via MyChart confirmed with patient.     Telephone follow up appointment with care management team member scheduled for:10/17/23 at 10:30am  Please call the care guide team at [phone] if you need to cancel or reschedule your appointment.   Please call [phone]-TALK (toll free, 24 hour hotline) go to [facility]gent [facility] [address] [place] [phone]) call 911 if you are experiencing a Mental Health or Behavioral Health Crisis or need someone to talk to.  Andrea Dimes RN, BSN [place]  [facility] Health RN Care Manager [phone]

## 2023-10-09 NOTE — Transitions of Care (Post Inpatient/ED Visit) (Signed)
 Transition of Care week 2  Visit Note  10/09/2023  Name: Joanna Sandoval MRN: [id number]          DOB: February 27, 1946  Situation: Patient enrolled in [facility] 30-day program. Visit completed with [name] by telephone.   Background:   Initial Transition Care Management Follow-up Telephone Call    Past Medical History:  Diagnosis Date   Arthritis    Diabetes mellitus    Hypercholesteremia    Hypertension    Post-menopausal bleeding    Postmenopausal bleeding 08/30/2011   Normal EBX 09/2011.    Assessment: Patient Reported Symptoms: Cognitive Cognitive Status: Able to follow simple commands, Alert and oriented to person, place, and time, Normal speech and language skills   Health Maintenance Behaviors: Annual physical exam  Neurological Neurological Review of Symptoms: Headaches, Dizziness Neurological Self-Management Outcome: 3 (uncertain) Neurological Comment: Headaches and dizziness are improving. PCP hospital follow up on 10/11/23.  HEENT HEENT Symptoms Reported: Runny nose, Sore throat HEENT Self-Management Outcome: 3 (uncertain) HEENT Comment: PCP hospital follow up on 10/11/23    Cardiovascular Cardiovascular Symptoms Reported: No symptoms reported    Respiratory Respiratory Symptoms Reported: Dry cough Respiratory Conditions:  (recovering from Parainfluenza virus infection) Respiratory Comment: Patient reports cough, denies wheezing has follow up with PCP on 10/11/23.  Endocrine Patient reports the following symptoms related to hypoglycemia or hyperglycemia : No symptoms reported Is patient diabetic?: Yes Is patient checking blood sugars at home?: Yes Endocrine Conditions: Diabetes Endocrine Management Strategies: Routine screening Endocrine Self-Management Outcome: 3 (uncertain) Endocrine Comment: Patient working to improve her diet.  Gastrointestinal Gastrointestinal Symptoms Reported: No symptoms reported      Genitourinary Genitourinary Symptoms Reported:  Incontinence Genitourinary Conditions: Incontinence Genitourinary Management Strategies: Exercise, Incontinence garment/pad Genitourinary Self-Management Outcome: 3 (uncertain)  Integumentary Integumentary Symptoms Reported: No symptoms reported    Musculoskeletal   Musculoskeletal Conditions: Mobility limited Musculoskeletal Management Strategies: Routine screening, Coping strategies, Exercise Musculoskeletal Self-Management Outcome: 3 (uncertain) Musculoskeletal Comment: HH PT coming today.      Psychosocial Psychosocial Symptoms Reported: No symptoms reported     Quality of Family Relationships: helpful, involved, supportive Do you feel physically threatened by others?: No   There were no vitals filed for this visit.  Medications Reviewed Today     Reviewed by Lucky Andrea LABOR, RN (Registered Nurse) on 10/09/23 at 1054  Med List Status: <None>   Medication Order Taking? Sig Documenting Provider Last Dose Status Informant  Accu-Chek Softclix Lancets lancets [id number] Yes USE AS DIRECTED Georgina Speaks, FNP  Active Self, Pharmacy Records  albuterol  (VENTOLIN  HFA) 108 [phone] Base) MCG/ACT inhaler [id number] Yes Inhale 2 puffs into the lungs every 6 (six) hours as needed. Von Bellis, MD  Active   atenolol  (TENORMIN ) 100 MG tablet [id number] Yes Take 100 mg by mouth daily. [provider]  Active Self, Pharmacy Records  atorvastatin  (LIPITOR) 40 MG tablet [id number] Yes Take 40 mg by mouth at bedtime. [provider]  Active Self, Pharmacy Records  benzonatate  (TESSALON ) 100 MG capsule [id number] Yes Take 1 capsule (100 mg total) by mouth 2 (two) times daily as needed (Throat soothing). Von Bellis, MD  Active   Blood Glucose Monitoring Suppl (ACCU-CHEK GUIDE) w/Device KIT [id number] Yes USE TO CHECK BLOOD SUGAR TWICE  DAILY AT 10AM AND 5 PM Georgina Speaks, FNP  Active Self, Pharmacy Records  Blood Pressure Monitoring (ADULT BLOOD PRESSURE CUFF LG) KIT [id number]  Take blood  pressure daily and as needed  Patient not taking: Reported on 10/09/2023  Georgina Speaks, FNP  Active Self, Pharmacy Records  D3-50 50000 units capsule [id number] Yes Take 50,000 Units by mouth once a week. [provider]  Active Self, Pharmacy Records  dorzolamide -timolol  (COSOPT ) 2-0.5 % ophthalmic solution [id number] Yes Place 1 drop into both eyes 2 (two) times daily. [provider]  Active Self, Pharmacy Records  fluticasone  (FLONASE ) 50 MCG/ACT nasal spray [id number] Yes Place 2 sprays into both nostrils daily. Georgina Speaks, FNP  Active Self, Pharmacy Records  fluticasone  furoate-vilanterol (BREO ELLIPTA ) 200-25 MCG/ACT AEPB [id number] Yes Inhale 1 puff into the lungs daily. Von Bellis, MD  Active   glimepiride  (AMARYL ) 2 MG tablet [id number] Yes TAKE 1 TABLET BY MOUTH IN THE  MORNING AND AT BEDTIME Georgina Speaks, FNP  Active Self, Pharmacy Records  glucose blood (ACCU-CHEK GUIDE) test strip [id number] Yes USE AS DIRECTED Georgina Speaks, FNP  Active Self, Pharmacy Records  guaiFENesin -dextromethorphan  (ROBITUSSIN DM) 100-10 MG/5ML syrup [id number] Yes Take 10 mLs by mouth every 6 (six) hours as needed for cough. Von Bellis, MD  Active   JARDIANCE  10 MG TABS tablet [id number] Yes TAKE 1 TABLET(10 MG) BY MOUTH DAILY BEFORE BREAKFAST Georgina Speaks, FNP  Active Self, Pharmacy Records  [name] KIT [id number] Yes Use to test blood sugar twice daily as directed. E11.65 Georgina Speaks, FNP  Active Self, Pharmacy Records  latanoprost  (XALATAN ) 0.005 % ophthalmic solution [id number] Yes Place 1 drop into both eyes at bedtime. [provider]  Active Self, Pharmacy Records  losartan  (COZAAR ) 50 MG tablet [id number] Yes Take 50 mg by mouth daily. [provider]  Active Self, Pharmacy Records            Recommendation:   Continue Current Plan of Care  Follow Up Plan:   Telephone follow-up in 1 week  Andrea Dimes RN, BSN [place]   [facility] RN Care Manager [phone]

## 2023-10-11 ENCOUNTER — Ambulatory Visit (INDEPENDENT_AMBULATORY_CARE_PROVIDER_SITE_OTHER): Payer: Self-pay | Admitting: Nurse Practitioner

## 2023-10-11 ENCOUNTER — Encounter: Payer: Self-pay | Admitting: Nurse Practitioner

## 2023-10-11 VITALS — BP 130/70 | HR 80 | Temp 98.7°F | Ht 62.0 in | Wt 190.0 lb

## 2023-10-11 DIAGNOSIS — E6609 Other obesity due to excess calories: Secondary | ICD-10-CM

## 2023-10-11 DIAGNOSIS — G8929 Other chronic pain: Secondary | ICD-10-CM | POA: Diagnosis not present

## 2023-10-11 DIAGNOSIS — E1151 Type 2 diabetes mellitus with diabetic peripheral angiopathy without gangrene: Secondary | ICD-10-CM

## 2023-10-11 DIAGNOSIS — H6123 Impacted cerumen, bilateral: Secondary | ICD-10-CM | POA: Diagnosis not present

## 2023-10-11 DIAGNOSIS — J302 Other seasonal allergic rhinitis: Secondary | ICD-10-CM | POA: Diagnosis not present

## 2023-10-11 DIAGNOSIS — H9192 Unspecified hearing loss, left ear: Secondary | ICD-10-CM | POA: Diagnosis not present

## 2023-10-11 DIAGNOSIS — I700 Atherosclerosis of aorta: Secondary | ICD-10-CM | POA: Diagnosis not present

## 2023-10-11 DIAGNOSIS — Z9981 Dependence on supplemental oxygen: Secondary | ICD-10-CM | POA: Diagnosis not present

## 2023-10-11 DIAGNOSIS — B348 Other viral infections of unspecified site: Secondary | ICD-10-CM | POA: Diagnosis not present

## 2023-10-11 DIAGNOSIS — M1990 Unspecified osteoarthritis, unspecified site: Secondary | ICD-10-CM | POA: Diagnosis not present

## 2023-10-11 DIAGNOSIS — Z79899 Other long term (current) drug therapy: Secondary | ICD-10-CM | POA: Diagnosis not present

## 2023-10-11 DIAGNOSIS — H401131 Primary open-angle glaucoma, bilateral, mild stage: Secondary | ICD-10-CM | POA: Diagnosis not present

## 2023-10-11 DIAGNOSIS — R440 Auditory hallucinations: Secondary | ICD-10-CM

## 2023-10-11 DIAGNOSIS — I119 Hypertensive heart disease without heart failure: Secondary | ICD-10-CM | POA: Diagnosis not present

## 2023-10-11 DIAGNOSIS — M6281 Muscle weakness (generalized): Secondary | ICD-10-CM | POA: Diagnosis not present

## 2023-10-11 DIAGNOSIS — M109 Gout, unspecified: Secondary | ICD-10-CM | POA: Diagnosis not present

## 2023-10-11 DIAGNOSIS — Z7984 Long term (current) use of oral hypoglycemic drugs: Secondary | ICD-10-CM | POA: Diagnosis not present

## 2023-10-11 DIAGNOSIS — Z7951 Long term (current) use of inhaled steroids: Secondary | ICD-10-CM | POA: Diagnosis not present

## 2023-10-11 DIAGNOSIS — J9601 Acute respiratory failure with hypoxia: Secondary | ICD-10-CM | POA: Diagnosis not present

## 2023-10-11 DIAGNOSIS — E7800 Pure hypercholesterolemia, unspecified: Secondary | ICD-10-CM | POA: Diagnosis not present

## 2023-10-11 DIAGNOSIS — M25562 Pain in left knee: Secondary | ICD-10-CM | POA: Diagnosis not present

## 2023-10-11 DIAGNOSIS — A4189 Other specified sepsis: Secondary | ICD-10-CM | POA: Diagnosis not present

## 2023-10-11 DIAGNOSIS — E66811 Obesity, class 1: Secondary | ICD-10-CM | POA: Diagnosis not present

## 2023-10-11 DIAGNOSIS — E1165 Type 2 diabetes mellitus with hyperglycemia: Secondary | ICD-10-CM | POA: Diagnosis not present

## 2023-10-11 DIAGNOSIS — Z6834 Body mass index (BMI) 34.0-34.9, adult: Secondary | ICD-10-CM

## 2023-10-11 DIAGNOSIS — E559 Vitamin D deficiency, unspecified: Secondary | ICD-10-CM | POA: Diagnosis not present

## 2023-10-11 DIAGNOSIS — M25561 Pain in right knee: Secondary | ICD-10-CM | POA: Diagnosis not present

## 2023-10-11 NOTE — Assessment & Plan Note (Signed)
 Patient admitted to hospital 6/9-6/13 for acute hypoxia with parainfluenza, negative for pneumonia on chest xray.  Prescribed ventolin , hycodan syrup, Breo IH, Flonase , and  Tesslon pearls and she has been taking these medications as ordered.  Patient given education on inhaler medication administration. Referral to pulmonology

## 2023-10-11 NOTE — Progress Notes (Signed)
 I,Jameka J Llittleton, CMA,acting as a Neurosurgeon for [employer], FNP.,have documented all relevant documentation on the behalf of Joanna Ada, FNP,as directed by  Joanna Ada, FNP while in the presence of Joanna Ada, FNP.  Subjective:  Patient ID: Joanna Sandoval , female    DOB: Oct 01, 1945 , 78 y.o.   MRN: [id number]  Chief Complaint  Patient presents with   HOSPITAL F/U    Patient presents today for a hospital f/u.  Patient reports she went to the hospital 09/24/23 and was discharged on 09/28/23. Patient reports she is still feeling really weak.     HPI  Patient admitted to hospital 6/9-6/13 for acute hypoxia with parainfluenza.  Started as a cold on 6/5 but became worse over 3 days.  She had a fever of 103 and was hypoxic upon arrival to the ER.  She was negative for pne.    She ventolin , hycodan syrup, Breo IH, Flonase , and  Tesslon pearls and she has been taking these medications as ordered.  Farxiga  and voltaren  gel were discontinued in the ER.  She continues to drink fluids.    Since discharge she states that she feels at 40%.  She continues to have a cough.  Her blood sugar was up this morning at 176 .  She is not wearing her CPAP at night currently, her son is helping her get new tubing for the machine.  She is working with physical therapy at home.  She has a nurse that will visit today.      Past Medical History:  Diagnosis Date   Arthritis    Diabetes mellitus    Hypercholesteremia    Hypertension    Post-menopausal bleeding    Postmenopausal bleeding 08/30/2011   Normal EBX 09/2011.     Family History  Problem Relation Age of Onset   Hypertension Mother    Parkinsonism Father    Hypertension Father    Cancer Father        ?lung   Diabetes Sister      Current Outpatient Medications:    Accu-Chek Softclix Lancets lancets, USE AS DIRECTED, Disp: 200 each, Rfl: 2   atenolol  (TENORMIN ) 100 MG tablet, Take 100 mg by mouth daily., Disp: , Rfl:    atorvastatin   (LIPITOR) 40 MG tablet, Take 40 mg by mouth at bedtime., Disp: , Rfl:    benzonatate  (TESSALON ) 100 MG capsule, Take 1 capsule (100 mg total) by mouth 2 (two) times daily as needed (Throat soothing)., Disp: 20 capsule, Rfl: 0   Blood Glucose Monitoring Suppl (ACCU-CHEK GUIDE) w/Device KIT, USE TO CHECK BLOOD SUGAR TWICE  DAILY AT 10AM AND 5 PM (Patient not taking: Reported on 10/31/2023), Disp: 1 kit, Rfl: 1   Blood Pressure Monitoring (ADULT BLOOD PRESSURE CUFF LG) KIT, Take blood pressure daily and as needed (Patient not taking: Reported on 10/31/2023), Disp: 1 kit, Rfl: 0   D3-50 50000 units capsule, Take 50,000 Units by mouth once a week., Disp: , Rfl: 0   dorzolamide -timolol  (COSOPT ) 2-0.5 % ophthalmic solution, Place 1 drop into both eyes 2 (two) times daily., Disp: , Rfl:    fluticasone  (FLONASE ) 50 MCG/ACT nasal spray, Place 2 sprays into both nostrils daily., Disp: 16 g, Rfl: 2   fluticasone  furoate-vilanterol (BREO ELLIPTA ) 200-25 MCG/ACT AEPB, Inhale 1 puff into the lungs daily., Disp: 60 each, Rfl: 11   glimepiride  (AMARYL ) 2 MG tablet, TAKE 1 TABLET BY MOUTH IN THE  MORNING AND AT BEDTIME, Disp: 200 tablet, Rfl: 2  glucose blood (ACCU-CHEK GUIDE) test strip, USE AS DIRECTED, Disp: 200 strip, Rfl: 2   guaiFENesin -dextromethorphan  (ROBITUSSIN DM) 100-10 MG/5ML syrup, Take 10 mLs by mouth every 6 (six) hours as needed for cough., Disp: 118 mL, Rfl: 0   latanoprost  (XALATAN ) 0.005 % ophthalmic solution, Place 1 drop into both eyes at bedtime., Disp: , Rfl:    losartan  (COZAAR ) 50 MG tablet, Take 50 mg by mouth daily., Disp: , Rfl:    albuterol  (VENTOLIN  HFA) 108 (90 Base) MCG/ACT inhaler, Inhale 2 puffs into the lungs every 6 (six) hours as needed., Disp: 18 g, Rfl: 1   dapagliflozin  propanediol (FARXIGA ) 10 MG TABS tablet, Take 1 tablet (10 mg total) by mouth daily before breakfast., Disp: 100 tablet, Rfl: 2   empagliflozin  (JARDIANCE ) 10 MG TABS tablet, Take 1 tablet (10 mg total) by mouth  daily., Disp: 90 tablet, Rfl: 2   [name] KIT, Use to test blood sugar twice daily as directed. E11.65, Disp: 1 kit, Rfl: 12   No Known Allergies   Review of Systems  Constitutional:  Negative for appetite change, chills and fever.  HENT:  Negative for congestion and sore throat.   Eyes:  Negative for discharge.  Respiratory:  Positive for cough and shortness of breath (with exertion and heat).   Cardiovascular:  Positive for leg swelling. Negative for chest pain and palpitations.  Gastrointestinal:  Negative for constipation, diarrhea, nausea and vomiting.  Endocrine: Positive for heat intolerance. Negative for polydipsia, polyphagia and polyuria.  Genitourinary:  Positive for urgency (incont at times, wears depends). Negative for dysuria.  Musculoskeletal:  Positive for arthralgias (right knee) and joint swelling (right knee).  Neurological:  Positive for tremors. Negative for dizziness and headaches.     Today's Vitals   10/11/23 1054 10/11/23 1110  BP: (!) 142/72 130/70  Pulse: 80   Temp: 98.7 F (37.1 C)   TempSrc: Oral   Weight: 190 lb (86.2 kg)   Height: 5' 2 (1.575 m)   PainSc: 0-No pain    Body mass index is 34.75 kg/m.  Wt Readings from Last 3 Encounters:  10/11/23 190 lb (86.2 kg)  09/24/23 198 lb (89.8 kg)  09/19/23 198 lb (89.8 kg)     Objective:  Physical Exam Constitutional:      General: She is not in acute distress.    Appearance: She is obese. She is not diaphoretic.  HENT:     Head: Normocephalic and atraumatic.  Cardiovascular:     Rate and Rhythm: Normal rate and regular rhythm.     Pulses: Normal pulses.     Heart sounds: Normal heart sounds.  Pulmonary:     Effort: Pulmonary effort is normal. No respiratory distress.     Breath sounds: No wheezing.     Comments: Clear upper airways, diminished in bases Musculoskeletal:        General: Swelling (right knee) present.     Right lower leg: Edema present.      Left lower leg: Edema present.  Skin:    General: Skin is warm and dry.     Capillary Refill: Capillary refill takes less than 2 seconds.  Neurological:     General: No focal deficit present.     Mental Status: She is alert and oriented to person, place, and time. Mental status is at baseline.     Gait: Gait abnormal (uses cane).  Psychiatric:        Mood and Affect: Mood normal.  Behavior: Behavior normal.        Thought Content: Thought content normal.        Judgment: Judgment normal.         Assessment And Plan:  Auditory hallucination  Class 1 obesity due to excess calories with body mass index (BMI) of 34.0 to 34.9 in adult, unspecified whether serious comorbidity present Assessment & Plan: She is encouraged to strive for BMI less than 30 to decrease cardiac risk. Advised to aim for at least 150 minutes of exercise per week.    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 Will check HgbA1c.   Orders: -     Dapagliflozin  Propanediol; Take 1 tablet (10 mg total) by mouth daily before breakfast.  Dispense: 100 tablet; Refill: 2 -     Empagliflozin ; Take 1 tablet (10 mg total) by mouth daily.  Dispense: 90 tablet; Refill: 2  Acute hypoxemic respiratory failure [facility]) Assessment & Plan: Patient admitted to hospital 6/9-6/13 for acute hypoxia with parainfluenza, negative for pneumonia on chest xray.  Prescribed ventolin , hycodan syrup, Breo IH, Flonase , and  Tesslon pearls and she has been taking these medications as ordered.  Patient given education on inhaler medication administration. Referral to pulmonology  Orders: -     Pulmonary Visit -     Albuterol  Sulfate HFA; Inhale 2 puffs into the lungs every 6 (six) hours as needed.  Dispense: 18 g; Refill: 1    Return if symptoms worsen or fail to improve.  Patient was given  opportunity to ask questions. Patient verbalized understanding of the plan and was able to repeat key elements of the plan. All questions were answered to their satisfaction.   I have reviewed this encounter including the documentation in this note and/or discussed this patient with Delon Louder FNP Student. I am certifying that I agree with the content of this note as the primary care nurse practitioner.  Joanna Ada, DNP, FNP-BC    I, Joanna Ada, FNP, have reviewed all documentation for this visit. The documentation on 10/11/23 for the exam, diagnosis, procedures, and orders are all accurate and complete.   IF YOU HAVE BEEN REFERRED TO A SPECIALIST, IT MAY TAKE 1-2 WEEKS TO SCHEDULE/PROCESS THE REFERRAL. IF YOU HAVE NOT HEARD FROM US /SPECIALIST IN TWO WEEKS, PLEASE GIVE US  A CALL AT [phone].

## 2023-10-16 DIAGNOSIS — M109 Gout, unspecified: Secondary | ICD-10-CM | POA: Diagnosis not present

## 2023-10-16 DIAGNOSIS — M6281 Muscle weakness (generalized): Secondary | ICD-10-CM | POA: Diagnosis not present

## 2023-10-16 DIAGNOSIS — I700 Atherosclerosis of aorta: Secondary | ICD-10-CM | POA: Diagnosis not present

## 2023-10-16 DIAGNOSIS — M25562 Pain in left knee: Secondary | ICD-10-CM | POA: Diagnosis not present

## 2023-10-16 DIAGNOSIS — E7800 Pure hypercholesterolemia, unspecified: Secondary | ICD-10-CM | POA: Diagnosis not present

## 2023-10-16 DIAGNOSIS — M25561 Pain in right knee: Secondary | ICD-10-CM | POA: Diagnosis not present

## 2023-10-16 DIAGNOSIS — M1990 Unspecified osteoarthritis, unspecified site: Secondary | ICD-10-CM | POA: Diagnosis not present

## 2023-10-16 DIAGNOSIS — E1151 Type 2 diabetes mellitus with diabetic peripheral angiopathy without gangrene: Secondary | ICD-10-CM | POA: Diagnosis not present

## 2023-10-16 DIAGNOSIS — Z79899 Other long term (current) drug therapy: Secondary | ICD-10-CM | POA: Diagnosis not present

## 2023-10-16 DIAGNOSIS — G8929 Other chronic pain: Secondary | ICD-10-CM | POA: Diagnosis not present

## 2023-10-16 DIAGNOSIS — J302 Other seasonal allergic rhinitis: Secondary | ICD-10-CM | POA: Diagnosis not present

## 2023-10-16 DIAGNOSIS — H9192 Unspecified hearing loss, left ear: Secondary | ICD-10-CM | POA: Diagnosis not present

## 2023-10-16 DIAGNOSIS — Z7984 Long term (current) use of oral hypoglycemic drugs: Secondary | ICD-10-CM | POA: Diagnosis not present

## 2023-10-16 DIAGNOSIS — A4189 Other specified sepsis: Secondary | ICD-10-CM | POA: Diagnosis not present

## 2023-10-16 DIAGNOSIS — Z7951 Long term (current) use of inhaled steroids: Secondary | ICD-10-CM | POA: Diagnosis not present

## 2023-10-16 DIAGNOSIS — E1165 Type 2 diabetes mellitus with hyperglycemia: Secondary | ICD-10-CM | POA: Diagnosis not present

## 2023-10-16 DIAGNOSIS — E559 Vitamin D deficiency, unspecified: Secondary | ICD-10-CM | POA: Diagnosis not present

## 2023-10-16 DIAGNOSIS — Z9981 Dependence on supplemental oxygen: Secondary | ICD-10-CM | POA: Diagnosis not present

## 2023-10-16 DIAGNOSIS — B348 Other viral infections of unspecified site: Secondary | ICD-10-CM | POA: Diagnosis not present

## 2023-10-16 DIAGNOSIS — H6123 Impacted cerumen, bilateral: Secondary | ICD-10-CM | POA: Diagnosis not present

## 2023-10-16 DIAGNOSIS — I119 Hypertensive heart disease without heart failure: Secondary | ICD-10-CM | POA: Diagnosis not present

## 2023-10-16 DIAGNOSIS — H401131 Primary open-angle glaucoma, bilateral, mild stage: Secondary | ICD-10-CM | POA: Diagnosis not present

## 2023-10-17 ENCOUNTER — Other Ambulatory Visit: Payer: Self-pay | Admitting: *Deleted

## 2023-10-17 DIAGNOSIS — H401131 Primary open-angle glaucoma, bilateral, mild stage: Secondary | ICD-10-CM | POA: Diagnosis not present

## 2023-10-17 DIAGNOSIS — E1165 Type 2 diabetes mellitus with hyperglycemia: Secondary | ICD-10-CM | POA: Diagnosis not present

## 2023-10-17 DIAGNOSIS — H9192 Unspecified hearing loss, left ear: Secondary | ICD-10-CM | POA: Diagnosis not present

## 2023-10-17 DIAGNOSIS — E7800 Pure hypercholesterolemia, unspecified: Secondary | ICD-10-CM | POA: Diagnosis not present

## 2023-10-17 DIAGNOSIS — B348 Other viral infections of unspecified site: Secondary | ICD-10-CM | POA: Diagnosis not present

## 2023-10-17 DIAGNOSIS — I700 Atherosclerosis of aorta: Secondary | ICD-10-CM | POA: Diagnosis not present

## 2023-10-17 DIAGNOSIS — E559 Vitamin D deficiency, unspecified: Secondary | ICD-10-CM | POA: Diagnosis not present

## 2023-10-17 DIAGNOSIS — Z7951 Long term (current) use of inhaled steroids: Secondary | ICD-10-CM | POA: Diagnosis not present

## 2023-10-17 DIAGNOSIS — Z79899 Other long term (current) drug therapy: Secondary | ICD-10-CM | POA: Diagnosis not present

## 2023-10-17 DIAGNOSIS — Z7984 Long term (current) use of oral hypoglycemic drugs: Secondary | ICD-10-CM | POA: Diagnosis not present

## 2023-10-17 DIAGNOSIS — J302 Other seasonal allergic rhinitis: Secondary | ICD-10-CM | POA: Diagnosis not present

## 2023-10-17 DIAGNOSIS — A4189 Other specified sepsis: Secondary | ICD-10-CM | POA: Diagnosis not present

## 2023-10-17 DIAGNOSIS — I119 Hypertensive heart disease without heart failure: Secondary | ICD-10-CM | POA: Diagnosis not present

## 2023-10-17 DIAGNOSIS — M6281 Muscle weakness (generalized): Secondary | ICD-10-CM | POA: Diagnosis not present

## 2023-10-17 DIAGNOSIS — G8929 Other chronic pain: Secondary | ICD-10-CM | POA: Diagnosis not present

## 2023-10-17 DIAGNOSIS — E1151 Type 2 diabetes mellitus with diabetic peripheral angiopathy without gangrene: Secondary | ICD-10-CM | POA: Diagnosis not present

## 2023-10-17 DIAGNOSIS — M25561 Pain in right knee: Secondary | ICD-10-CM | POA: Diagnosis not present

## 2023-10-17 DIAGNOSIS — H6123 Impacted cerumen, bilateral: Secondary | ICD-10-CM | POA: Diagnosis not present

## 2023-10-17 DIAGNOSIS — M1990 Unspecified osteoarthritis, unspecified site: Secondary | ICD-10-CM | POA: Diagnosis not present

## 2023-10-17 DIAGNOSIS — M25562 Pain in left knee: Secondary | ICD-10-CM | POA: Diagnosis not present

## 2023-10-17 DIAGNOSIS — M109 Gout, unspecified: Secondary | ICD-10-CM | POA: Diagnosis not present

## 2023-10-17 DIAGNOSIS — Z9981 Dependence on supplemental oxygen: Secondary | ICD-10-CM | POA: Diagnosis not present

## 2023-10-17 NOTE — Patient Instructions (Signed)
 Visit Information  Thank you for taking time to visit with me today. Please don't hesitate to contact me if I can be of assistance to you before our next scheduled telephone appointment.   Following is a copy of your care plan:   Goals Addressed             This Visit's Progress    VBCI Transitions of Care (TOC) Care Plan       Problems:  Recent Hospitalization for treatment of Respiratory Failure Home Health services barrier: Patient has not been contacted for [facility] PT/OT/PCS and Knowledge Deficit Related to Respiratory Failure  Goal:  Over the next 30 days, the patient will not experience hospital readmission  Interventions:  Transitions of Care: Doctor Visits  - discussed the importance of doctor visits Reviewed Signs and symptoms of infection Reviewed provider notes from 10/11/23 Medication review Reviewed infection prevention Ensured patient receiving HH services PT weekly and PCS weekly for 8 weeks    Patient Self Care Activities:  Attend all scheduled provider appointments Call provider office for new concerns or questions  Participate in Transition of Care Program/Attend TOC scheduled calls Take medications as prescribed   identify and remove indoor air pollutants listen for public air quality announcements every day Avoid going out in the heat of the day  Plan:  Telephone follow up appointment with care management team member scheduled for:  10/24/23 at 1pm        Patient verbalizes understanding of instructions and care plan provided today and agrees to view in MyChart. Active MyChart status and patient understanding of how to access instructions and care plan via MyChart confirmed with patient.     Telephone follow up appointment with care management team member scheduled for:10/24/23 at 1pm  Please call the care guide team at [phone] if you need to cancel or reschedule your appointment.   Please call [phone]-TALK (toll free, 24 hour hotline) go to  [facility] [address], [place] [phone]) call 911 if you are experiencing a Mental Health or Behavioral Health Crisis or need someone to talk to.  Andrea Dimes RN, BSN [place]  [facility] RN Care Manager [phone]

## 2023-10-17 NOTE — Transitions of Care (Post Inpatient/ED Visit) (Signed)
 Transition of Care week 3  Visit Note  10/17/2023  Name: Joanna Sandoval MRN: [id number]          DOB: [birth year]  Situation: Patient enrolled in [facility] 30-day program. Visit completed with [name] by telephone.   Background:   Initial Transition Care Management Follow-up Telephone Call    Past Medical History:  Diagnosis Date   Arthritis    Diabetes mellitus    Hypercholesteremia    Hypertension    Post-menopausal bleeding    Postmenopausal bleeding 08/30/2011   Normal EBX 09/2011.    Assessment: Patient Reported Symptoms: Cognitive Cognitive Status: No symptoms reported      Neurological Neurological Review of Symptoms: Not assessed    HEENT HEENT Symptoms Reported: No symptoms reported HEENT Self-Management Outcome: 4 (good)    Cardiovascular Cardiovascular Symptoms Reported: No symptoms reported    Respiratory Respiratory Symptoms Reported: Dry cough Respiratory Management Strategies: CPAP, Medication therapy, Routine screening Respiratory Self-Management Outcome: 3 (uncertain)  Endocrine Endocrine Symptoms Reported: Not assessed    Gastrointestinal Gastrointestinal Symptoms Reported: Not assessed      Genitourinary Genitourinary Symptoms Reported: Not assessed    Integumentary Integumentary Symptoms Reported: Not assessed    Musculoskeletal Musculoskelatal Symptoms Reviewed: Weakness, Unsteady gait Musculoskeletal Management Strategies: Routine screening, Exercise, Coping strategies Musculoskeletal Self-Management Outcome: 3 (uncertain) Musculoskeletal Comment: working with PT weekly Falls in the past year?: No    Psychosocial Psychosocial Symptoms Reported: No symptoms reported         There were no vitals filed for this visit.  Medications Reviewed Today     Reviewed by Lucky Andrea LABOR, RN (Registered Nurse) on 10/17/23 at 1103  Med List Status: <None>   Medication Order Taking? Sig Documenting Provider Last Dose Status Informant  Accu-Chek  Softclix Lancets lancets [id number] Yes USE AS DIRECTED Georgina Speaks, FNP  Active Self, Pharmacy Records  albuterol  (VENTOLIN  HFA) 108 [phone] Base) MCG/ACT inhaler [id number] Yes Inhale 2 puffs into the lungs every 6 (six) hours as needed. Von Bellis, MD  Active   atenolol  (TENORMIN ) 100 MG tablet [id number] Yes Take 100 mg by mouth daily. [provider]  Active Self, Pharmacy Records  atorvastatin  (LIPITOR) 40 MG tablet [id number] Yes Take 40 mg by mouth at bedtime. [provider]  Active Self, Pharmacy Records  benzonatate  (TESSALON ) 100 MG capsule [id number] Yes Take 1 capsule (100 mg total) by mouth 2 (two) times daily as needed (Throat soothing). Von Bellis, MD  Active   Blood Glucose Monitoring Suppl (ACCU-CHEK GUIDE) w/Device KIT [id number] Yes USE TO CHECK BLOOD SUGAR TWICE  DAILY AT 10AM AND 5 PM Georgina Speaks, FNP  Active Self, Pharmacy Records  Blood Pressure Monitoring (ADULT BLOOD PRESSURE CUFF LG) KIT [id number] Yes Take blood pressure daily and as needed Georgina Speaks, FNP  Active Self, Pharmacy Records  D3-50 50000 units capsule [id number] Yes Take 50,000 Units by mouth once a week. [provider]  Active Self, Pharmacy Records  dorzolamide -timolol  (COSOPT ) 2-0.5 % ophthalmic solution [id number] Yes Place 1 drop into both eyes 2 (two) times daily. [provider]  Active Self, Pharmacy Records  fluticasone  (FLONASE ) 50 MCG/ACT nasal spray [id number] Yes Place 2 sprays into both nostrils daily. Georgina Speaks, FNP  Active Self, Pharmacy Records  fluticasone  furoate-vilanterol (BREO ELLIPTA ) 200-25 MCG/ACT AEPB [id number] Yes Inhale 1 puff into the lungs daily. Von Bellis, MD  Active   glimepiride  (AMARYL ) 2 MG tablet [id number] Yes TAKE 1 TABLET BY MOUTH IN THE  MORNING AND AT  BEDTIME Georgina Speaks, FNP  Active Self, Pharmacy Records  glucose blood (ACCU-CHEK GUIDE) test strip [id number] Yes USE AS DIRECTED Georgina Speaks, FNP  Active Self, Pharmacy Records   guaiFENesin -dextromethorphan  [facility] DM) 100-10 MG/5ML syrup [id number] Yes Take 10 mLs by mouth every 6 (six) hours as needed for cough. Von Bellis, MD  Active   JARDIANCE  10 MG TABS tablet [id number] Yes TAKE 1 TABLET(10 MG) BY MOUTH DAILY BEFORE BREAKFAST Georgina Speaks, FNP  Active Self, Pharmacy Records  [name] KIT [id number] Yes Use to test blood sugar twice daily as directed. E11.65 Georgina Speaks, FNP  Active Self, Pharmacy Records  latanoprost  (XALATAN ) 0.005 % ophthalmic solution [id number] Yes Place 1 drop into both eyes at bedtime. [provider]  Active Self, Pharmacy Records  losartan  (COZAAR ) 50 MG tablet [id number] Yes Take 50 mg by mouth daily. [provider]  Active Self, Pharmacy Records            Recommendation:   Continue Current Plan of Care  Follow Up Plan:   Telephone follow-up in 1 week  Andrea Dimes RN, BSN [place]  [facility] RN Care Manager [phone]

## 2023-10-22 DIAGNOSIS — G4733 Obstructive sleep apnea (adult) (pediatric): Secondary | ICD-10-CM | POA: Diagnosis not present

## 2023-10-23 DIAGNOSIS — E1165 Type 2 diabetes mellitus with hyperglycemia: Secondary | ICD-10-CM | POA: Diagnosis not present

## 2023-10-23 DIAGNOSIS — I119 Hypertensive heart disease without heart failure: Secondary | ICD-10-CM | POA: Diagnosis not present

## 2023-10-23 DIAGNOSIS — M1990 Unspecified osteoarthritis, unspecified site: Secondary | ICD-10-CM | POA: Diagnosis not present

## 2023-10-23 DIAGNOSIS — Z9981 Dependence on supplemental oxygen: Secondary | ICD-10-CM | POA: Diagnosis not present

## 2023-10-23 DIAGNOSIS — M25562 Pain in left knee: Secondary | ICD-10-CM | POA: Diagnosis not present

## 2023-10-23 DIAGNOSIS — A4189 Other specified sepsis: Secondary | ICD-10-CM | POA: Diagnosis not present

## 2023-10-23 DIAGNOSIS — G8929 Other chronic pain: Secondary | ICD-10-CM | POA: Diagnosis not present

## 2023-10-23 DIAGNOSIS — Z7951 Long term (current) use of inhaled steroids: Secondary | ICD-10-CM | POA: Diagnosis not present

## 2023-10-23 DIAGNOSIS — I700 Atherosclerosis of aorta: Secondary | ICD-10-CM | POA: Diagnosis not present

## 2023-10-23 DIAGNOSIS — H401131 Primary open-angle glaucoma, bilateral, mild stage: Secondary | ICD-10-CM | POA: Diagnosis not present

## 2023-10-23 DIAGNOSIS — M25561 Pain in right knee: Secondary | ICD-10-CM | POA: Diagnosis not present

## 2023-10-23 DIAGNOSIS — M6281 Muscle weakness (generalized): Secondary | ICD-10-CM | POA: Diagnosis not present

## 2023-10-23 DIAGNOSIS — H9192 Unspecified hearing loss, left ear: Secondary | ICD-10-CM | POA: Diagnosis not present

## 2023-10-23 DIAGNOSIS — Z79899 Other long term (current) drug therapy: Secondary | ICD-10-CM | POA: Diagnosis not present

## 2023-10-23 DIAGNOSIS — J302 Other seasonal allergic rhinitis: Secondary | ICD-10-CM | POA: Diagnosis not present

## 2023-10-23 DIAGNOSIS — B348 Other viral infections of unspecified site: Secondary | ICD-10-CM | POA: Diagnosis not present

## 2023-10-23 DIAGNOSIS — E7800 Pure hypercholesterolemia, unspecified: Secondary | ICD-10-CM | POA: Diagnosis not present

## 2023-10-23 DIAGNOSIS — E559 Vitamin D deficiency, unspecified: Secondary | ICD-10-CM | POA: Diagnosis not present

## 2023-10-23 DIAGNOSIS — E1151 Type 2 diabetes mellitus with diabetic peripheral angiopathy without gangrene: Secondary | ICD-10-CM | POA: Diagnosis not present

## 2023-10-23 DIAGNOSIS — M109 Gout, unspecified: Secondary | ICD-10-CM | POA: Diagnosis not present

## 2023-10-23 DIAGNOSIS — H6123 Impacted cerumen, bilateral: Secondary | ICD-10-CM | POA: Diagnosis not present

## 2023-10-23 DIAGNOSIS — Z7984 Long term (current) use of oral hypoglycemic drugs: Secondary | ICD-10-CM | POA: Diagnosis not present

## 2023-10-24 ENCOUNTER — Other Ambulatory Visit: Payer: Self-pay | Admitting: *Deleted

## 2023-10-24 NOTE — Patient Instructions (Signed)
 Visit Information  Thank you for taking time to visit with me today. Please don't hesitate to contact me if I can be of assistance to you before our next scheduled telephone appointment.   Following is a copy of your care plan:   Goals Addressed             This Visit's Progress    VBCI Transitions of Care (TOC) Care Plan       Problems:  Recent Hospitalization for treatment of Respiratory Failure Home Health services barrier: Patient has not been contacted for [facility] PT/OT/PCS and Knowledge Deficit Related to Respiratory Failure  Goal:  Over the next 30 days, the patient will not experience hospital readmission  Interventions:  Transitions of Care: Doctor Visits  - discussed the importance of doctor visits Reviewed Signs and symptoms of infection Medication review-discussed following up with pharmacy regarding ordered BP monitor on 08/15/23 Reviewed infection prevention Reviewed HH PT weekly, patient walking with a walker Educated on breathing exercises   Patient Self Care Activities:  Attend all scheduled provider appointments Call provider office for new concerns or questions  Participate in Transition of Care Program/Attend TOC scheduled calls Take medications as prescribed   identify and remove indoor air pollutants listen for public air quality announcements every day Avoid going out in the heat of the day  Plan:  Telephone follow up appointment with care management team member scheduled for:  10/31/23 at 1:15pm        Patient verbalizes understanding of instructions and care plan provided today and agrees to view in MyChart. Active MyChart status and patient understanding of how to access instructions and care plan via MyChart confirmed with patient.     Telephone follow up appointment with care management team member scheduled for:10/31/23 at 1:15pm  Please call the care guide team at [phone] if you need to cancel or reschedule your appointment.   Please  call 1-800-273-TALK (toll free, 24 hour hotline) go to [facility] Urgent [facility] [address] [place] [phone]) call 911 if you are experiencing a Mental Health or Behavioral Health Crisis or need someone to talk to.  Andrea Dimes RN, BSN [place]  Value-Based Care Institute [facility] Health RN Care Manager [phone]

## 2023-10-24 NOTE — Transitions of Care (Post Inpatient/ED Visit) (Signed)
 Transition of Care week 4  Visit Note  10/24/2023  Name: Joanna Sandoval MRN: [id number]          DOB: Jun 24, 1945  Situation: Patient enrolled in [facility] 30-day program. Visit completed with [name] by telephone.   Background:   Initial Transition Care Management Follow-up Telephone Call    Past Medical History:  Diagnosis Date   Arthritis    Diabetes mellitus    Hypercholesteremia    Hypertension    Post-menopausal bleeding    Postmenopausal bleeding 08/30/2011   Normal EBX 09/2011.    Assessment: Patient Reported Symptoms: Cognitive Cognitive Status: No symptoms reported      Neurological Neurological Review of Symptoms: No symptoms reported    HEENT HEENT Symptoms Reported: No symptoms reported      Cardiovascular Cardiovascular Symptoms Reported: No symptoms reported    Respiratory Respiratory Symptoms Reported: Dry cough Additional Respiratory Details: dry cough is improving Respiratory Management Strategies: Exercise, Routine screening, CPAP Respiratory Self-Management Outcome: 4 (good)  Endocrine Endocrine Symptoms Reported: No symptoms reported List most recent blood sugar readings, include date and time of day: BS this morning was 150 Endocrine Self-Management Outcome: 3 (uncertain) Endocrine Comment: Patient has cut out [employer]  Gastrointestinal Gastrointestinal Symptoms Reported: No symptoms reported      Genitourinary Genitourinary Symptoms Reported: Incontinence Genitourinary Management Strategies: Exercise, Incontinence garment/pad Genitourinary Self-Management Outcome: 3 (uncertain)  Integumentary Integumentary Symptoms Reported: No symptoms reported    Musculoskeletal Musculoskelatal Symptoms Reviewed: Difficulty walking Musculoskeletal Management Strategies: Routine screening, Exercise Musculoskeletal Self-Management Outcome: 4 (good) Musculoskeletal Comment: Working with [facility] PT weekly, ambulating with walker. Planning to attend the [employer] play on her birthday. Falls in the past year?: Yes Number of falls in past year: 1 or less Was there an injury with Fall?: No Fall Risk Category Calculator: 1 Patient Fall Risk Level: Low Fall Risk Patient at Risk for Falls Due to: Impaired mobility Fall risk Follow up: Falls evaluation completed, Education provided, Falls prevention discussed  Psychosocial Psychosocial Symptoms Reported: No symptoms reported     Quality of Family Relationships: helpful, involved, supportive   There were no vitals filed for this visit.  Medications Reviewed Today     Reviewed by Lucky Andrea LABOR, RN (Registered Nurse) on 10/24/23 at 1329  Med List Status: <None>   Medication Order Taking? Sig Documenting Provider Last Dose Status Informant  Accu-Chek Softclix Lancets lancets [id number] Yes USE AS DIRECTED Georgina Speaks, FNP  Active Self, Pharmacy Records  albuterol  (VENTOLIN  HFA) [phone] Base) MCG/ACT inhaler [id number] Yes Inhale 2 puffs into the lungs every 6 (six) hours as needed. Von Bellis, MD  Active   atenolol  (TENORMIN ) 100 MG tablet [id number] Yes Take 100 mg by mouth daily. [provider]  Active Self, Pharmacy Records  atorvastatin  (LIPITOR) 40 MG tablet [id number] Yes Take 40 mg by mouth at bedtime. [provider]  Active Self, Pharmacy Records  benzonatate  (TESSALON ) 100 MG capsule [id number] Yes Take 1 capsule (100 mg total) by mouth 2 (two) times daily as needed (Throat soothing). Von Bellis, MD  Active   Blood Glucose Monitoring Suppl (ACCU-CHEK GUIDE) w/Device KIT [id number] Yes USE TO CHECK BLOOD SUGAR TWICE  DAILY AT 10AM AND 5 PM Georgina Speaks, FNP  Active Self, Pharmacy Records  Blood Pressure Monitoring (ADULT BLOOD PRESSURE CUFF LG) KIT [id number]  Take blood pressure daily and as needed  Patient not taking: Reported on 10/24/2023   Georgina Speaks, FNP  Active Self, Pharmacy Records  D3-50 50000 units capsule [id number] Yes Take 50,000 Units by mouth once a  week. [provider]  Active Self, Pharmacy Records  dorzolamide -timolol  (COSOPT ) 2-0.5 % ophthalmic solution [id number] Yes Place 1 drop into both eyes 2 (two) times daily. [provider]  Active Self, Pharmacy Records  fluticasone  (FLONASE ) 50 MCG/ACT nasal spray [id number] Yes Place 2 sprays into both nostrils daily. Georgina Speaks, FNP  Active Self, Pharmacy Records  fluticasone  furoate-vilanterol (BREO ELLIPTA ) 200-25 MCG/ACT AEPB [id number] Yes Inhale 1 puff into the lungs daily. Von Bellis, MD  Active   glimepiride  (AMARYL ) 2 MG tablet [id number] Yes TAKE 1 TABLET BY MOUTH IN THE  MORNING AND AT BEDTIME Georgina Speaks, FNP  Active Self, Pharmacy Records  glucose blood (ACCU-CHEK GUIDE) test strip [id number] Yes USE AS DIRECTED Georgina Speaks, FNP  Active Self, Pharmacy Records  guaiFENesin -dextromethorphan  [facility] DM) 100-10 MG/5ML syrup [id number] Yes Take 10 mLs by mouth every 6 (six) hours as needed for cough. Von Bellis, MD  Active   JARDIANCE  10 MG TABS tablet [id number] Yes TAKE 1 TABLET(10 MG) BY MOUTH DAILY BEFORE BREAKFAST Georgina Speaks, FNP  Active Self, Pharmacy Records  [name] KIT [id number] Yes Use to test blood sugar twice daily as directed. E11.65 Georgina Speaks, FNP  Active Self, Pharmacy Records  latanoprost  (XALATAN ) 0.005 % ophthalmic solution [id number] Yes Place 1 drop into both eyes at bedtime. [provider]  Active Self, Pharmacy Records  losartan  (COZAAR ) 50 MG tablet [id number] Yes Take 50 mg by mouth daily. [provider]  Active Self, Pharmacy Records            Recommendation:   Continue Current Plan of Care  Follow Up Plan:   Telephone follow-up in 1 week  Andrea Dimes RN, BSN [place]  Value-Based Care Institute [facility] Health RN Care Manager [phone]

## 2023-10-25 ENCOUNTER — Other Ambulatory Visit (HOSPITAL_COMMUNITY): Payer: Self-pay

## 2023-10-25 DIAGNOSIS — Z7984 Long term (current) use of oral hypoglycemic drugs: Secondary | ICD-10-CM | POA: Diagnosis not present

## 2023-10-25 DIAGNOSIS — E559 Vitamin D deficiency, unspecified: Secondary | ICD-10-CM | POA: Diagnosis not present

## 2023-10-25 DIAGNOSIS — B348 Other viral infections of unspecified site: Secondary | ICD-10-CM | POA: Diagnosis not present

## 2023-10-25 DIAGNOSIS — H401131 Primary open-angle glaucoma, bilateral, mild stage: Secondary | ICD-10-CM | POA: Diagnosis not present

## 2023-10-25 DIAGNOSIS — G8929 Other chronic pain: Secondary | ICD-10-CM | POA: Diagnosis not present

## 2023-10-25 DIAGNOSIS — M25561 Pain in right knee: Secondary | ICD-10-CM | POA: Diagnosis not present

## 2023-10-25 DIAGNOSIS — J302 Other seasonal allergic rhinitis: Secondary | ICD-10-CM | POA: Diagnosis not present

## 2023-10-25 DIAGNOSIS — E1151 Type 2 diabetes mellitus with diabetic peripheral angiopathy without gangrene: Secondary | ICD-10-CM | POA: Diagnosis not present

## 2023-10-25 DIAGNOSIS — I119 Hypertensive heart disease without heart failure: Secondary | ICD-10-CM | POA: Diagnosis not present

## 2023-10-25 DIAGNOSIS — H9192 Unspecified hearing loss, left ear: Secondary | ICD-10-CM | POA: Diagnosis not present

## 2023-10-25 DIAGNOSIS — M25562 Pain in left knee: Secondary | ICD-10-CM | POA: Diagnosis not present

## 2023-10-25 DIAGNOSIS — A4189 Other specified sepsis: Secondary | ICD-10-CM | POA: Diagnosis not present

## 2023-10-25 DIAGNOSIS — Z79899 Other long term (current) drug therapy: Secondary | ICD-10-CM | POA: Diagnosis not present

## 2023-10-25 DIAGNOSIS — I700 Atherosclerosis of aorta: Secondary | ICD-10-CM | POA: Diagnosis not present

## 2023-10-25 DIAGNOSIS — E7800 Pure hypercholesterolemia, unspecified: Secondary | ICD-10-CM | POA: Diagnosis not present

## 2023-10-25 DIAGNOSIS — M6281 Muscle weakness (generalized): Secondary | ICD-10-CM | POA: Diagnosis not present

## 2023-10-25 DIAGNOSIS — H6123 Impacted cerumen, bilateral: Secondary | ICD-10-CM | POA: Diagnosis not present

## 2023-10-25 DIAGNOSIS — M1990 Unspecified osteoarthritis, unspecified site: Secondary | ICD-10-CM | POA: Diagnosis not present

## 2023-10-25 DIAGNOSIS — Z7951 Long term (current) use of inhaled steroids: Secondary | ICD-10-CM | POA: Diagnosis not present

## 2023-10-25 DIAGNOSIS — M109 Gout, unspecified: Secondary | ICD-10-CM | POA: Diagnosis not present

## 2023-10-25 DIAGNOSIS — Z9981 Dependence on supplemental oxygen: Secondary | ICD-10-CM | POA: Diagnosis not present

## 2023-10-25 DIAGNOSIS — E1165 Type 2 diabetes mellitus with hyperglycemia: Secondary | ICD-10-CM | POA: Diagnosis not present

## 2023-10-26 ENCOUNTER — Other Ambulatory Visit (HOSPITAL_COMMUNITY): Payer: Self-pay

## 2023-10-30 DIAGNOSIS — H6123 Impacted cerumen, bilateral: Secondary | ICD-10-CM | POA: Diagnosis not present

## 2023-10-30 DIAGNOSIS — M1990 Unspecified osteoarthritis, unspecified site: Secondary | ICD-10-CM | POA: Diagnosis not present

## 2023-10-30 DIAGNOSIS — G8929 Other chronic pain: Secondary | ICD-10-CM | POA: Diagnosis not present

## 2023-10-30 DIAGNOSIS — M25562 Pain in left knee: Secondary | ICD-10-CM | POA: Diagnosis not present

## 2023-10-30 DIAGNOSIS — B348 Other viral infections of unspecified site: Secondary | ICD-10-CM | POA: Diagnosis not present

## 2023-10-30 DIAGNOSIS — M6281 Muscle weakness (generalized): Secondary | ICD-10-CM | POA: Diagnosis not present

## 2023-10-30 DIAGNOSIS — E7800 Pure hypercholesterolemia, unspecified: Secondary | ICD-10-CM | POA: Diagnosis not present

## 2023-10-30 DIAGNOSIS — M25561 Pain in right knee: Secondary | ICD-10-CM | POA: Diagnosis not present

## 2023-10-30 DIAGNOSIS — J302 Other seasonal allergic rhinitis: Secondary | ICD-10-CM | POA: Diagnosis not present

## 2023-10-30 DIAGNOSIS — E1151 Type 2 diabetes mellitus with diabetic peripheral angiopathy without gangrene: Secondary | ICD-10-CM | POA: Diagnosis not present

## 2023-10-30 DIAGNOSIS — I700 Atherosclerosis of aorta: Secondary | ICD-10-CM | POA: Diagnosis not present

## 2023-10-30 DIAGNOSIS — A4189 Other specified sepsis: Secondary | ICD-10-CM | POA: Diagnosis not present

## 2023-10-30 DIAGNOSIS — H9192 Unspecified hearing loss, left ear: Secondary | ICD-10-CM | POA: Diagnosis not present

## 2023-10-30 DIAGNOSIS — Z79899 Other long term (current) drug therapy: Secondary | ICD-10-CM | POA: Diagnosis not present

## 2023-10-30 DIAGNOSIS — M109 Gout, unspecified: Secondary | ICD-10-CM | POA: Diagnosis not present

## 2023-10-30 DIAGNOSIS — Z7984 Long term (current) use of oral hypoglycemic drugs: Secondary | ICD-10-CM | POA: Diagnosis not present

## 2023-10-30 DIAGNOSIS — E1165 Type 2 diabetes mellitus with hyperglycemia: Secondary | ICD-10-CM | POA: Diagnosis not present

## 2023-10-30 DIAGNOSIS — H401131 Primary open-angle glaucoma, bilateral, mild stage: Secondary | ICD-10-CM | POA: Diagnosis not present

## 2023-10-30 DIAGNOSIS — I119 Hypertensive heart disease without heart failure: Secondary | ICD-10-CM | POA: Diagnosis not present

## 2023-10-30 DIAGNOSIS — Z9981 Dependence on supplemental oxygen: Secondary | ICD-10-CM | POA: Diagnosis not present

## 2023-10-30 DIAGNOSIS — E559 Vitamin D deficiency, unspecified: Secondary | ICD-10-CM | POA: Diagnosis not present

## 2023-10-30 DIAGNOSIS — Z7951 Long term (current) use of inhaled steroids: Secondary | ICD-10-CM | POA: Diagnosis not present

## 2023-10-31 ENCOUNTER — Other Ambulatory Visit: Payer: Self-pay | Admitting: *Deleted

## 2023-10-31 ENCOUNTER — Other Ambulatory Visit: Payer: Self-pay

## 2023-10-31 MED ORDER — ACCU-CHEK FASTCLIX LANCET KIT: PACK | 12 refills | Status: AC

## 2023-10-31 NOTE — Patient Instructions (Signed)
 Visit Information  Thank you for taking time to visit with me today. Please don't hesitate to contact me if I can be of assistance to you before our next scheduled telephone appointment.   Following is a copy of your care plan:   Goals Addressed             This Visit's Progress    COMPLETED: VBCI Transitions of Care (TOC) Care Plan       Problems:  Recent Hospitalization for treatment of Respiratory Failure Home Health services barrier: Patient has not been contacted for [facility] PT/OT/PCS and Knowledge Deficit Related to Respiratory Failure  Goal:  Over the next 30 days, the patient will not experience hospital readmission  Interventions:  Transitions of Care: Doctor Visits  - discussed the importance of doctor visits Reviewed Signs and symptoms of infection Medication review-discussed following up with pharmacy regarding ordered BP monitor on 08/15/23-will be sent to patient  Reviewed infection prevention Reviewed HH PT weekly, patient walking with a walker, will start OT next week Reviewed proper technique with using Breo Ellipta    Patient Self Care Activities:  Attend all scheduled provider appointments Call provider office for new concerns or questions  Participate in Transition of Care Program/Attend TOC scheduled calls Take medications as prescribed   identify and remove indoor air pollutants listen for public air quality announcements every day Avoid going out in the heat of the day  Plan:  The care management team will reach out to the patient again over the next 14 days.        Patient verbalizes understanding of instructions and care plan provided today and agrees to view in MyChart. Active MyChart status and patient understanding of how to access instructions and care plan via MyChart confirmed with patient.     The care management team will reach out to the patient again over the next 14 days.   Please call the care guide team at [phone] if you need to  cancel or reschedule your appointment.   Please call 1-800-273-TALK (toll free, 24 hour hotline) go to [facility] Urgent [facility] [address], [place] [phone]) call 911 if you are experiencing a Mental Health or Behavioral Health Crisis or need someone to talk to.  Andrea Dimes RN, BSN [place]  Value-Based Care Institute [facility] Health RN Care Manager [phone]

## 2023-10-31 NOTE — Transitions of Care (Post Inpatient/ED Visit) (Signed)
 Transition of Care week 5  Visit Note  10/31/2023  Name: Joanna Sandoval MRN: [id number]          DOB: Jan 20, 1946  Situation: Patient enrolled in [facility] 30-day program. Visit completed with [name] by telephone.   Background:   Initial Transition Care Management Follow-up Telephone Call    Past Medical History:  Diagnosis Date   Arthritis    Diabetes mellitus    Hypercholesteremia    Hypertension    Post-menopausal bleeding    Postmenopausal bleeding 08/30/2011   Normal EBX 09/2011.    Assessment: Patient Reported Symptoms: Cognitive Cognitive Status: No symptoms reported      Neurological Neurological Review of Symptoms: No symptoms reported    HEENT HEENT Symptoms Reported: No symptoms reported      Cardiovascular Cardiovascular Symptoms Reported: No symptoms reported    Respiratory Respiratory Symptoms Reported: Dry cough    Endocrine Endocrine Symptoms Reported: Not assessed    Gastrointestinal Gastrointestinal Symptoms Reported: No symptoms reported      Genitourinary Genitourinary Symptoms Reported: Not assessed    Integumentary Integumentary Symptoms Reported: No symptoms reported    Musculoskeletal Musculoskelatal Symptoms Reviewed: No symptoms reported Musculoskeletal Management Strategies: Exercise, Coping strategies, Routine screening, Medical device Musculoskeletal Self-Management Outcome: 4 (good) Musculoskeletal Comment: Will start working with OT next week. Continues to work with PT and do exercises at home. Ambulating with walker when out of the house      Psychosocial Psychosocial Symptoms Reported: No symptoms reported         There were no vitals filed for this visit.  Medications Reviewed Today     Reviewed by Lucky Andrea LABOR, RN (Registered Nurse) on 10/31/23 at 1401  Med List Status: <None>   Medication Order Taking? Sig Documenting Provider Last Dose Status Informant  Accu-Chek Softclix Lancets lancets [id number] Yes USE AS  DIRECTED Georgina Speaks, FNP  Active Self, Pharmacy Records  albuterol  (VENTOLIN  HFA) 108 [phone] Base) MCG/ACT inhaler [id number] Yes Inhale 2 puffs into the lungs every 6 (six) hours as needed. Von Bellis, MD  Active   atenolol  (TENORMIN ) 100 MG tablet [id number] Yes Take 100 mg by mouth daily. [provider]  Active Self, Pharmacy Records  atorvastatin  (LIPITOR) 40 MG tablet [id number] Yes Take 40 mg by mouth at bedtime. [provider]  Active Self, Pharmacy Records  benzonatate  (TESSALON ) 100 MG capsule [id number] Yes Take 1 capsule (100 mg total) by mouth 2 (two) times daily as needed (Throat soothing). Von Bellis, MD  Active   Blood Glucose Monitoring Suppl (ACCU-CHEK GUIDE) w/Device KIT [id number]  USE TO CHECK BLOOD SUGAR TWICE  DAILY AT 10AM AND 5 PM  Patient not taking: Reported on 10/31/2023   Georgina Speaks, FNP  Active Self, Pharmacy Records  Blood Pressure Monitoring (ADULT BLOOD PRESSURE CUFF LG) KIT [id number]  Take blood pressure daily and as needed  Patient not taking: Reported on 10/31/2023   Georgina Speaks, FNP  Active Self, Pharmacy Records  D3-50 50000 units capsule [id number] Yes Take 50,000 Units by mouth once a week. [provider]  Active Self, Pharmacy Records  dorzolamide -timolol  (COSOPT ) 2-0.5 % ophthalmic solution [id number] Yes Place 1 drop into both eyes 2 (two) times daily. [provider]  Active Self, Pharmacy Records  fluticasone  (FLONASE ) 50 MCG/ACT nasal spray [id number] Yes Place 2 sprays into both nostrils daily. Georgina Speaks, FNP  Active Self, Pharmacy Records  fluticasone  furoate-vilanterol (BREO ELLIPTA ) 200-25 MCG/ACT AEPB [id number] Yes Inhale 1 puff into the lungs  daily. Von Bellis, MD  Active   glimepiride  (AMARYL ) 2 MG tablet [id number] Yes TAKE 1 TABLET BY MOUTH IN THE  MORNING AND AT BEDTIME Georgina Speaks, FNP  Active Self, Pharmacy Records  glucose blood (ACCU-CHEK GUIDE) test strip [id number] Yes USE AS DIRECTED Georgina Speaks, FNP  Active Self, Pharmacy Records  guaiFENesin -dextromethorphan  (ROBITUSSIN DM) 100-10 MG/5ML syrup [id number] Yes Take 10 mLs by mouth every 6 (six) hours as needed for cough. Von Bellis, MD  Active   JARDIANCE  10 MG TABS tablet [id number] Yes TAKE 1 TABLET(10 MG) BY MOUTH DAILY BEFORE BREAKFAST Georgina Speaks, FNP  Active Self, Pharmacy Records  [name] KIT [id number] Yes Use to test blood sugar twice daily as directed. E11.65 Georgina Speaks, FNP  Active Self, Pharmacy Records  latanoprost  (XALATAN ) 0.005 % ophthalmic solution [id number] Yes Place 1 drop into both eyes at bedtime. [provider]  Active Self, Pharmacy Records  losartan  (COZAAR ) 50 MG tablet [id number] Yes Take 50 mg by mouth daily. [provider]  Active Self, Pharmacy Records            Recommendation:   Continue Current Plan of Care  Follow Up Plan:   Closing From:  Transitions of Care Program Resume CCM, message to S. Snead to schedule with [place], RN  Andrea Dimes RN, BSN [place]  Value-Based Care Institute [facility] Health RN Care Manager [phone]

## 2023-11-01 ENCOUNTER — Telehealth: Payer: Self-pay | Admitting: *Deleted

## 2023-11-01 ENCOUNTER — Telehealth: Payer: Self-pay

## 2023-11-01 DIAGNOSIS — I119 Hypertensive heart disease without heart failure: Secondary | ICD-10-CM | POA: Diagnosis not present

## 2023-11-01 DIAGNOSIS — A4189 Other specified sepsis: Secondary | ICD-10-CM | POA: Diagnosis not present

## 2023-11-01 DIAGNOSIS — Z7951 Long term (current) use of inhaled steroids: Secondary | ICD-10-CM | POA: Diagnosis not present

## 2023-11-01 DIAGNOSIS — J302 Other seasonal allergic rhinitis: Secondary | ICD-10-CM | POA: Diagnosis not present

## 2023-11-01 DIAGNOSIS — E7800 Pure hypercholesterolemia, unspecified: Secondary | ICD-10-CM | POA: Diagnosis not present

## 2023-11-01 DIAGNOSIS — M109 Gout, unspecified: Secondary | ICD-10-CM | POA: Diagnosis not present

## 2023-11-01 DIAGNOSIS — E1151 Type 2 diabetes mellitus with diabetic peripheral angiopathy without gangrene: Secondary | ICD-10-CM | POA: Diagnosis not present

## 2023-11-01 DIAGNOSIS — H9192 Unspecified hearing loss, left ear: Secondary | ICD-10-CM | POA: Diagnosis not present

## 2023-11-01 DIAGNOSIS — M6281 Muscle weakness (generalized): Secondary | ICD-10-CM | POA: Diagnosis not present

## 2023-11-01 DIAGNOSIS — M25562 Pain in left knee: Secondary | ICD-10-CM | POA: Diagnosis not present

## 2023-11-01 DIAGNOSIS — G8929 Other chronic pain: Secondary | ICD-10-CM | POA: Diagnosis not present

## 2023-11-01 DIAGNOSIS — H401131 Primary open-angle glaucoma, bilateral, mild stage: Secondary | ICD-10-CM | POA: Diagnosis not present

## 2023-11-01 DIAGNOSIS — E559 Vitamin D deficiency, unspecified: Secondary | ICD-10-CM | POA: Diagnosis not present

## 2023-11-01 DIAGNOSIS — I700 Atherosclerosis of aorta: Secondary | ICD-10-CM | POA: Diagnosis not present

## 2023-11-01 DIAGNOSIS — Z79899 Other long term (current) drug therapy: Secondary | ICD-10-CM | POA: Diagnosis not present

## 2023-11-01 DIAGNOSIS — Z9981 Dependence on supplemental oxygen: Secondary | ICD-10-CM | POA: Diagnosis not present

## 2023-11-01 DIAGNOSIS — H6123 Impacted cerumen, bilateral: Secondary | ICD-10-CM | POA: Diagnosis not present

## 2023-11-01 DIAGNOSIS — E1165 Type 2 diabetes mellitus with hyperglycemia: Secondary | ICD-10-CM | POA: Diagnosis not present

## 2023-11-01 DIAGNOSIS — M25561 Pain in right knee: Secondary | ICD-10-CM | POA: Diagnosis not present

## 2023-11-01 DIAGNOSIS — Z7984 Long term (current) use of oral hypoglycemic drugs: Secondary | ICD-10-CM | POA: Diagnosis not present

## 2023-11-01 DIAGNOSIS — M1990 Unspecified osteoarthritis, unspecified site: Secondary | ICD-10-CM | POA: Diagnosis not present

## 2023-11-01 DIAGNOSIS — B348 Other viral infections of unspecified site: Secondary | ICD-10-CM | POA: Diagnosis not present

## 2023-11-01 NOTE — Progress Notes (Signed)
 Complex Care Management Note  Care Guide Note 11/01/2023 Name: GORDON CARLSON MRN: [id number] DOB: [birth year]  AMEERA TIGUE is a 78 y.o. year old female who sees Georgina Speaks, FNP for primary care. I reached out to Gearld FORBES Ku by phone today to offer complex care management services.  Ms. Fuller was given information about Complex Care Management services today including:   The Complex Care Management services include support from the care team which includes your Nurse Care Manager, Clinical Social Worker, or Pharmacist.  The Complex Care Management team is here to help remove barriers to the health concerns and goals most important to you. Complex Care Management services are voluntary, and the patient may decline or stop services at any time by request to their care team member.   Complex Care Management Consent Status: Patient agreed to services and verbal consent obtained.   Follow up plan:  Telephone appointment with complex care management team member scheduled for:  7/25  Encounter Outcome:  Patient Scheduled  Harlene Satterfield  [facility]  [facility] Guide  Direct Dial: [phone]  Fax [phone]

## 2023-11-01 NOTE — Telephone Encounter (Signed)
 Copied from CRM [id number]. Topic: Clinical - Home Health Verbal Orders >> Nov 01, 2023 11:48 AM Thersia BROCKS wrote: Caller/Agency: [facility]  Callback Number: [phone] Service Requested: Occupational Therapy Frequency: 1 time a week for 6 weeks  Any new concerns about the patient? No  LVM for [facility]

## 2023-11-04 ENCOUNTER — Encounter: Payer: Self-pay | Admitting: Nurse Practitioner

## 2023-11-04 DIAGNOSIS — E66811 Obesity, class 1: Secondary | ICD-10-CM | POA: Insufficient documentation

## 2023-11-04 DIAGNOSIS — R440 Auditory hallucinations: Secondary | ICD-10-CM | POA: Insufficient documentation

## 2023-11-04 MED ORDER — DAPAGLIFLOZIN PROPANEDIOL 10 MG PO TABS: 10.0000 mg | ORAL_TABLET | Freq: Every day | ORAL | 2 refills | Status: DC

## 2023-11-04 MED ORDER — ALBUTEROL SULFATE HFA 108 (90 BASE) MCG/ACT IN AERS: 2.0000 | INHALATION_SPRAY | Freq: Four times a day (QID) | RESPIRATORY_TRACT | 1 refills | Status: AC | PRN

## 2023-11-04 MED ORDER — EMPAGLIFLOZIN 10 MG PO TABS
10.0000 mg | ORAL_TABLET | Freq: Every day | ORAL | 2 refills | Status: DC
Start: 2023-11-04 — End: 2024-01-14

## 2023-11-04 NOTE — Assessment & Plan Note (Signed)
 hgbA1c stable, continue current medications. Will check HgbA1c.

## 2023-11-04 NOTE — Assessment & Plan Note (Signed)
 She is encouraged to strive for BMI less than 30 to decrease cardiac risk. Advised to aim for at least 150 minutes of exercise per week.

## 2023-11-05 ENCOUNTER — Other Ambulatory Visit: Payer: Self-pay | Admitting: Nurse Practitioner

## 2023-11-05 DIAGNOSIS — E119 Type 2 diabetes mellitus without complications: Secondary | ICD-10-CM

## 2023-11-06 DIAGNOSIS — M25561 Pain in right knee: Secondary | ICD-10-CM | POA: Diagnosis not present

## 2023-11-06 DIAGNOSIS — H9192 Unspecified hearing loss, left ear: Secondary | ICD-10-CM | POA: Diagnosis not present

## 2023-11-06 DIAGNOSIS — E559 Vitamin D deficiency, unspecified: Secondary | ICD-10-CM | POA: Diagnosis not present

## 2023-11-06 DIAGNOSIS — I119 Hypertensive heart disease without heart failure: Secondary | ICD-10-CM | POA: Diagnosis not present

## 2023-11-06 DIAGNOSIS — H401131 Primary open-angle glaucoma, bilateral, mild stage: Secondary | ICD-10-CM | POA: Diagnosis not present

## 2023-11-06 DIAGNOSIS — M25562 Pain in left knee: Secondary | ICD-10-CM | POA: Diagnosis not present

## 2023-11-06 DIAGNOSIS — H6123 Impacted cerumen, bilateral: Secondary | ICD-10-CM | POA: Diagnosis not present

## 2023-11-06 DIAGNOSIS — E7800 Pure hypercholesterolemia, unspecified: Secondary | ICD-10-CM | POA: Diagnosis not present

## 2023-11-06 DIAGNOSIS — Z7984 Long term (current) use of oral hypoglycemic drugs: Secondary | ICD-10-CM | POA: Diagnosis not present

## 2023-11-06 DIAGNOSIS — A4189 Other specified sepsis: Secondary | ICD-10-CM | POA: Diagnosis not present

## 2023-11-06 DIAGNOSIS — M1990 Unspecified osteoarthritis, unspecified site: Secondary | ICD-10-CM | POA: Diagnosis not present

## 2023-11-06 DIAGNOSIS — G8929 Other chronic pain: Secondary | ICD-10-CM | POA: Diagnosis not present

## 2023-11-06 DIAGNOSIS — E1151 Type 2 diabetes mellitus with diabetic peripheral angiopathy without gangrene: Secondary | ICD-10-CM | POA: Diagnosis not present

## 2023-11-06 DIAGNOSIS — Z79899 Other long term (current) drug therapy: Secondary | ICD-10-CM | POA: Diagnosis not present

## 2023-11-06 DIAGNOSIS — Z9981 Dependence on supplemental oxygen: Secondary | ICD-10-CM | POA: Diagnosis not present

## 2023-11-06 DIAGNOSIS — M6281 Muscle weakness (generalized): Secondary | ICD-10-CM | POA: Diagnosis not present

## 2023-11-06 DIAGNOSIS — J302 Other seasonal allergic rhinitis: Secondary | ICD-10-CM | POA: Diagnosis not present

## 2023-11-06 DIAGNOSIS — I700 Atherosclerosis of aorta: Secondary | ICD-10-CM | POA: Diagnosis not present

## 2023-11-06 DIAGNOSIS — Z7951 Long term (current) use of inhaled steroids: Secondary | ICD-10-CM | POA: Diagnosis not present

## 2023-11-06 DIAGNOSIS — M109 Gout, unspecified: Secondary | ICD-10-CM | POA: Diagnosis not present

## 2023-11-06 DIAGNOSIS — E1165 Type 2 diabetes mellitus with hyperglycemia: Secondary | ICD-10-CM | POA: Diagnosis not present

## 2023-11-06 DIAGNOSIS — B348 Other viral infections of unspecified site: Secondary | ICD-10-CM | POA: Diagnosis not present

## 2023-11-07 ENCOUNTER — Other Ambulatory Visit: Payer: Self-pay | Admitting: Nurse Practitioner

## 2023-11-07 DIAGNOSIS — E7800 Pure hypercholesterolemia, unspecified: Secondary | ICD-10-CM | POA: Diagnosis not present

## 2023-11-07 DIAGNOSIS — M109 Gout, unspecified: Secondary | ICD-10-CM | POA: Diagnosis not present

## 2023-11-07 DIAGNOSIS — E1165 Type 2 diabetes mellitus with hyperglycemia: Secondary | ICD-10-CM | POA: Diagnosis not present

## 2023-11-07 DIAGNOSIS — M1990 Unspecified osteoarthritis, unspecified site: Secondary | ICD-10-CM | POA: Diagnosis not present

## 2023-11-07 DIAGNOSIS — Z7984 Long term (current) use of oral hypoglycemic drugs: Secondary | ICD-10-CM | POA: Diagnosis not present

## 2023-11-07 DIAGNOSIS — A4189 Other specified sepsis: Secondary | ICD-10-CM | POA: Diagnosis not present

## 2023-11-07 DIAGNOSIS — H6123 Impacted cerumen, bilateral: Secondary | ICD-10-CM | POA: Diagnosis not present

## 2023-11-07 DIAGNOSIS — I739 Peripheral vascular disease, unspecified: Secondary | ICD-10-CM

## 2023-11-07 DIAGNOSIS — M25561 Pain in right knee: Secondary | ICD-10-CM | POA: Diagnosis not present

## 2023-11-07 DIAGNOSIS — H401131 Primary open-angle glaucoma, bilateral, mild stage: Secondary | ICD-10-CM | POA: Diagnosis not present

## 2023-11-07 DIAGNOSIS — I119 Hypertensive heart disease without heart failure: Secondary | ICD-10-CM | POA: Diagnosis not present

## 2023-11-07 DIAGNOSIS — H9192 Unspecified hearing loss, left ear: Secondary | ICD-10-CM | POA: Diagnosis not present

## 2023-11-07 DIAGNOSIS — G8929 Other chronic pain: Secondary | ICD-10-CM | POA: Diagnosis not present

## 2023-11-07 DIAGNOSIS — M25562 Pain in left knee: Secondary | ICD-10-CM | POA: Diagnosis not present

## 2023-11-07 DIAGNOSIS — B348 Other viral infections of unspecified site: Secondary | ICD-10-CM | POA: Diagnosis not present

## 2023-11-07 DIAGNOSIS — Z79899 Other long term (current) drug therapy: Secondary | ICD-10-CM | POA: Diagnosis not present

## 2023-11-07 DIAGNOSIS — J302 Other seasonal allergic rhinitis: Secondary | ICD-10-CM | POA: Diagnosis not present

## 2023-11-07 DIAGNOSIS — E1151 Type 2 diabetes mellitus with diabetic peripheral angiopathy without gangrene: Secondary | ICD-10-CM | POA: Diagnosis not present

## 2023-11-07 DIAGNOSIS — M6281 Muscle weakness (generalized): Secondary | ICD-10-CM | POA: Diagnosis not present

## 2023-11-07 DIAGNOSIS — I700 Atherosclerosis of aorta: Secondary | ICD-10-CM | POA: Diagnosis not present

## 2023-11-07 DIAGNOSIS — E559 Vitamin D deficiency, unspecified: Secondary | ICD-10-CM | POA: Diagnosis not present

## 2023-11-07 DIAGNOSIS — Z7951 Long term (current) use of inhaled steroids: Secondary | ICD-10-CM | POA: Diagnosis not present

## 2023-11-07 DIAGNOSIS — Z9981 Dependence on supplemental oxygen: Secondary | ICD-10-CM | POA: Diagnosis not present

## 2023-11-07 DIAGNOSIS — E1139 Type 2 diabetes mellitus with other diabetic ophthalmic complication: Secondary | ICD-10-CM

## 2023-11-08 ENCOUNTER — Other Ambulatory Visit: Payer: Self-pay | Admitting: Nurse Practitioner

## 2023-11-08 DIAGNOSIS — I700 Atherosclerosis of aorta: Secondary | ICD-10-CM

## 2023-11-08 DIAGNOSIS — M25562 Pain in left knee: Secondary | ICD-10-CM | POA: Diagnosis not present

## 2023-11-08 DIAGNOSIS — Z9981 Dependence on supplemental oxygen: Secondary | ICD-10-CM | POA: Diagnosis not present

## 2023-11-08 DIAGNOSIS — E7800 Pure hypercholesterolemia, unspecified: Secondary | ICD-10-CM | POA: Diagnosis not present

## 2023-11-08 DIAGNOSIS — M1990 Unspecified osteoarthritis, unspecified site: Secondary | ICD-10-CM | POA: Diagnosis not present

## 2023-11-08 DIAGNOSIS — H9192 Unspecified hearing loss, left ear: Secondary | ICD-10-CM | POA: Diagnosis not present

## 2023-11-08 DIAGNOSIS — M109 Gout, unspecified: Secondary | ICD-10-CM | POA: Diagnosis not present

## 2023-11-08 DIAGNOSIS — M25561 Pain in right knee: Secondary | ICD-10-CM | POA: Diagnosis not present

## 2023-11-08 DIAGNOSIS — J302 Other seasonal allergic rhinitis: Secondary | ICD-10-CM | POA: Diagnosis not present

## 2023-11-08 DIAGNOSIS — I119 Hypertensive heart disease without heart failure: Secondary | ICD-10-CM | POA: Diagnosis not present

## 2023-11-08 DIAGNOSIS — A4189 Other specified sepsis: Secondary | ICD-10-CM

## 2023-11-08 DIAGNOSIS — M6281 Muscle weakness (generalized): Secondary | ICD-10-CM | POA: Diagnosis not present

## 2023-11-08 DIAGNOSIS — Z79899 Other long term (current) drug therapy: Secondary | ICD-10-CM | POA: Diagnosis not present

## 2023-11-08 DIAGNOSIS — H6123 Impacted cerumen, bilateral: Secondary | ICD-10-CM | POA: Diagnosis not present

## 2023-11-08 DIAGNOSIS — E1151 Type 2 diabetes mellitus with diabetic peripheral angiopathy without gangrene: Secondary | ICD-10-CM | POA: Diagnosis not present

## 2023-11-08 DIAGNOSIS — E1165 Type 2 diabetes mellitus with hyperglycemia: Secondary | ICD-10-CM

## 2023-11-08 DIAGNOSIS — Z7951 Long term (current) use of inhaled steroids: Secondary | ICD-10-CM | POA: Diagnosis not present

## 2023-11-08 DIAGNOSIS — F329 Major depressive disorder, single episode, unspecified: Secondary | ICD-10-CM

## 2023-11-08 DIAGNOSIS — Z7984 Long term (current) use of oral hypoglycemic drugs: Secondary | ICD-10-CM | POA: Diagnosis not present

## 2023-11-08 DIAGNOSIS — E559 Vitamin D deficiency, unspecified: Secondary | ICD-10-CM | POA: Diagnosis not present

## 2023-11-08 DIAGNOSIS — B348 Other viral infections of unspecified site: Secondary | ICD-10-CM | POA: Diagnosis not present

## 2023-11-08 DIAGNOSIS — H401131 Primary open-angle glaucoma, bilateral, mild stage: Secondary | ICD-10-CM | POA: Diagnosis not present

## 2023-11-08 DIAGNOSIS — G8929 Other chronic pain: Secondary | ICD-10-CM | POA: Diagnosis not present

## 2023-11-08 NOTE — Progress Notes (Unsigned)
 Chief Complaint  Patient presents with   Initial Home Care Certification   Received home health orders orders from [facility]. Start of care 10/02/2023.   Certification and orders from 10/02/2023 through 11/30/2023 are reviewed, signed and faxed back to home health company.  Need of intermittent skilled services at home: PT, SN Add on services on 11/01/2023 - OT - effective 10/28/2023  The home health care plan has been established by me and will be reviewed and updated as needed to maximize patient recovery.  I certify that all home health services have been and will be furnished to the patient while under my care.  Face-to-face encounter in which the need for home health services was established: 10/11/2023  Patient is receiving home health services for the following diagnoses: Problem List Items Addressed This Visit       Cardiovascular and Mediastinum   Aortic atherosclerosis (HCC)   Type 2 diabetes mellitus with diabetic peripheral angiopathy without gangrene, without long-term current use of insulin  (HCC)     Other   Reactive depression   Other Visit Diagnoses       Other specified sepsis (HCC) [A41.89]    -  Primary     Type 2 diabetes mellitus with hyperglycemia, without long-term current use of insulin  (HCC) [E11.65]         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

## 2023-11-09 ENCOUNTER — Other Ambulatory Visit: Payer: Self-pay

## 2023-11-09 ENCOUNTER — Telehealth

## 2023-11-09 NOTE — Patient Outreach (Signed)
 Complex Care Management   Visit Note  11/09/2023  Name:  Joanna Sandoval MRN: [id number] DOB: 1945-11-08  Situation: Referral received for Complex Care Management related to DMII, OSA w/CPAP, cough. I obtained verbal consent from Patient.  Visit completed with patient on the phone.  Background:   Past Medical History:  Diagnosis Date   Arthritis    Diabetes mellitus    Hypercholesteremia    Hypertension    Post-menopausal bleeding    Postmenopausal bleeding 08/30/2011   Normal EBX 09/2011.    Assessment: Patient Reported Symptoms:  Cognitive Cognitive Status: Alert and oriented to person, place, and time, Difficulties with attention and concentration, Struggling with memory recall Cognitive/Intellectual Conditions Management [RPT]: None reported or documented in medical history or problem list   Health Maintenance Behaviors: Annual physical exam, Healthy diet, Exercise Healing Pattern: Average Health Facilitated by: Healthy diet, Rest  Neurological Neurological Review of Symptoms: Headaches Neurological Management Strategies: Adequate rest, Routine screening Neurological Self-Management Outcome: 4 (good)  HEENT HEENT Symptoms Reported: No symptoms reported      Cardiovascular Cardiovascular Symptoms Reported: No symptoms reported Does patient have uncontrolled Hypertension?: No Cardiovascular Management Strategies: Medication therapy, Routine screening Cardiovascular Self-Management Outcome: 4 (good)  Respiratory Respiratory Symptoms Reported: Productive cough Respiratory Management Strategies: CPAP, Routine screening, Medication therapy Respiratory Self-Management Outcome: 4 (good)  Endocrine Endocrine Symptoms Reported: No symptoms reported Is patient diabetic?: Yes Is patient checking blood sugars at home?: Yes List most recent blood sugar readings, include date and time of day: FBS today 140    Gastrointestinal Gastrointestinal Symptoms Reported: No symptoms  reported      Genitourinary Genitourinary Symptoms Reported: Incontinence Genitourinary Management Strategies: Incontinence garment/pad Genitourinary Self-Management Outcome: 4 (good)  Integumentary Integumentary Symptoms Reported: No symptoms reported    Musculoskeletal Musculoskelatal Symptoms Reviewed: Unsteady gait, Limited mobility Musculoskeletal Management Strategies: Medical device, Routine screening, Exercise Musculoskeletal Self-Management Outcome: 4 (good) Falls in the past year?: Yes Number of falls in past year: 1 or less Was there an injury with Fall?: No Fall Risk Category Calculator: 1 Patient Fall Risk Level: Low Fall Risk Patient at Risk for Falls Due to: History of fall(s), Impaired balance/gait, Impaired mobility Fall risk Follow up: Falls evaluation completed, Education provided, Falls prevention discussed  Psychosocial Psychosocial Symptoms Reported: No symptoms reported   Major Change/Loss/Stressor/Fears (CP): Medical condition, self Techniques to Cope with Loss/Stress/Change: Exercise, Spiritual practice(s) Quality of Family Relationships: involved, supportive, helpful Do you feel physically threatened by others?: No      11/09/2023   10:23 AM  Depression screen PHQ 2/9  Decreased Interest 0  Down, Depressed, Hopeless 1  PHQ - 2 Score 1    There were no vitals filed for this visit.  Medications Reviewed Today     Reviewed by [name], RN (Registered Nurse) on 11/09/23 at 1002  Med List Status: <None>   Medication Order Taking? Sig Documenting Provider Last Dose Status Informant  Accu-Chek Softclix Lancets lancets [id number]  USE AS DIRECTED Georgina Speaks, FNP  Active Self, Pharmacy Records  albuterol  (VENTOLIN  HFA) [phone] Base) MCG/ACT inhaler [id number] Yes Inhale 2 puffs into the lungs every 6 (six) hours as needed. Georgina Speaks, FNP  Active   atenolol  (TENORMIN ) 100 MG tablet [id number] Yes Take 100 mg by mouth daily. [provider]   Active Self, Pharmacy Records  atorvastatin  (LIPITOR) 40 MG tablet [id number] Yes Take 40 mg by mouth at bedtime. [provider]  Active Self, Pharmacy Records  benzonatate  (TESSALON ) 100  MG capsule [id number] Yes Take 1 capsule (100 mg total) by mouth 2 (two) times daily as needed (Throat soothing). Von Bellis, MD  Active   Blood Glucose Monitoring Suppl (ACCU-CHEK GUIDE) w/Device KIT [id number]  USE TO CHECK BLOOD SUGAR TWICE  DAILY AT 10AM AND 5 PM  Patient not taking: Reported on 10/31/2023   Georgina Speaks, FNP  Active Self, Pharmacy Records  Blood Pressure Monitoring (ADULT BLOOD PRESSURE CUFF LG) KIT [id number]  Take blood pressure daily and as needed  Patient not taking: Reported on 10/31/2023   Georgina Speaks, FNP  Active Self, Pharmacy Records  D3-50 50000 units capsule [id number] Yes Take 50,000 Units by mouth once a week. [provider]  Active Self, Pharmacy Records  dapagliflozin  propanediol (FARXIGA ) 10 MG TABS tablet [id number] Yes Take 1 tablet (10 mg total) by mouth daily before breakfast. Georgina Speaks, FNP  Active   dorzolamide -timolol  (COSOPT ) 2-0.5 % ophthalmic solution [id number] Yes Place 1 drop into both eyes 2 (two) times daily. [provider]  Active Self, Pharmacy Records  empagliflozin  (JARDIANCE ) 10 MG TABS tablet [id number] Yes Take 1 tablet (10 mg total) by mouth daily. Georgina Speaks, FNP  Active   fluticasone  (FLONASE ) 50 MCG/ACT nasal spray [id number] Yes Place 2 sprays into both nostrils daily. Georgina Speaks, FNP  Active Self, Pharmacy Records  fluticasone  furoate-vilanterol (BREO ELLIPTA ) 200-25 MCG/ACT AEPB [id number] Yes Inhale 1 puff into the lungs daily. Von Bellis, MD  Active   glimepiride  (AMARYL ) 2 MG tablet [id number]  TAKE 1 TABLET BY MOUTH IN THE  MORNING AND AT BEDTIME Georgina Speaks, FNP  Active Self, Pharmacy Records  glucose blood (ACCU-CHEK GUIDE) test strip [id number]  USE AS DIRECTED Georgina Speaks, FNP  Active Self, Pharmacy  Records  guaiFENesin -dextromethorphan  (ROBITUSSIN DM) 100-10 MG/5ML syrup [id number]  Take 10 mLs by mouth every 6 (six) hours as needed for cough. Von Bellis, MD  Active   Lancets Misc. [facility] FASTCLIX LANCET) KIT [id number]  Use to test blood sugar twice daily as directed. E11.65 Georgina Speaks, FNP  Active   latanoprost  (XALATAN ) 0.005 % ophthalmic solution [id number]  Place 1 drop into both eyes at bedtime. [provider]  Active Self, Pharmacy Records  losartan  (COZAAR ) 50 MG tablet [id number]  Take 50 mg by mouth daily. [provider]  Active Self, Pharmacy Records            Recommendation:   PCP Follow-up  Follow Up Plan:   Telephone follow up appointment date/time:  Friday, 12/07/23 at 09:30 AM  Clayborne Ly RN BSN CCM [place]  [facility], [facility] Health Nurse Care Coordinator  Direct Dial: [phone] Website: [email or web address]

## 2023-11-12 DIAGNOSIS — H401131 Primary open-angle glaucoma, bilateral, mild stage: Secondary | ICD-10-CM | POA: Diagnosis not present

## 2023-11-12 DIAGNOSIS — E7800 Pure hypercholesterolemia, unspecified: Secondary | ICD-10-CM | POA: Diagnosis not present

## 2023-11-12 DIAGNOSIS — M109 Gout, unspecified: Secondary | ICD-10-CM | POA: Diagnosis not present

## 2023-11-12 DIAGNOSIS — Z79899 Other long term (current) drug therapy: Secondary | ICD-10-CM | POA: Diagnosis not present

## 2023-11-12 DIAGNOSIS — Z7951 Long term (current) use of inhaled steroids: Secondary | ICD-10-CM | POA: Diagnosis not present

## 2023-11-12 DIAGNOSIS — I119 Hypertensive heart disease without heart failure: Secondary | ICD-10-CM | POA: Diagnosis not present

## 2023-11-12 DIAGNOSIS — E1165 Type 2 diabetes mellitus with hyperglycemia: Secondary | ICD-10-CM | POA: Diagnosis not present

## 2023-11-12 DIAGNOSIS — H6123 Impacted cerumen, bilateral: Secondary | ICD-10-CM | POA: Diagnosis not present

## 2023-11-12 DIAGNOSIS — Z9981 Dependence on supplemental oxygen: Secondary | ICD-10-CM | POA: Diagnosis not present

## 2023-11-12 DIAGNOSIS — I700 Atherosclerosis of aorta: Secondary | ICD-10-CM | POA: Diagnosis not present

## 2023-11-12 DIAGNOSIS — M25562 Pain in left knee: Secondary | ICD-10-CM | POA: Diagnosis not present

## 2023-11-12 DIAGNOSIS — Z7984 Long term (current) use of oral hypoglycemic drugs: Secondary | ICD-10-CM | POA: Diagnosis not present

## 2023-11-12 DIAGNOSIS — E559 Vitamin D deficiency, unspecified: Secondary | ICD-10-CM | POA: Diagnosis not present

## 2023-11-12 DIAGNOSIS — A4189 Other specified sepsis: Secondary | ICD-10-CM | POA: Diagnosis not present

## 2023-11-12 DIAGNOSIS — M1990 Unspecified osteoarthritis, unspecified site: Secondary | ICD-10-CM | POA: Diagnosis not present

## 2023-11-12 DIAGNOSIS — B348 Other viral infections of unspecified site: Secondary | ICD-10-CM | POA: Diagnosis not present

## 2023-11-12 DIAGNOSIS — E1151 Type 2 diabetes mellitus with diabetic peripheral angiopathy without gangrene: Secondary | ICD-10-CM | POA: Diagnosis not present

## 2023-11-12 DIAGNOSIS — M6281 Muscle weakness (generalized): Secondary | ICD-10-CM | POA: Diagnosis not present

## 2023-11-12 DIAGNOSIS — H9192 Unspecified hearing loss, left ear: Secondary | ICD-10-CM | POA: Diagnosis not present

## 2023-11-12 DIAGNOSIS — J302 Other seasonal allergic rhinitis: Secondary | ICD-10-CM | POA: Diagnosis not present

## 2023-11-12 DIAGNOSIS — G8929 Other chronic pain: Secondary | ICD-10-CM | POA: Diagnosis not present

## 2023-11-12 DIAGNOSIS — M25561 Pain in right knee: Secondary | ICD-10-CM | POA: Diagnosis not present

## 2023-11-13 NOTE — Patient Instructions (Signed)
 Visit Information  Thank you for taking time to visit with me today. Please don't hesitate to contact me if I can be of assistance to you before our next scheduled appointment.  Our next appointment is by telephone on Friday, August 22 at 09:30 AM Please call the care guide team at [phone] if you need to cancel or reschedule your appointment.   Following is a copy of your care plan:   Goals Addressed             This Visit's Progress    VBCI RN Care Plan related to DMII       Problems:  Chronic Disease Management support and education needs related to DMII  Goal: Over the next 90 days the Patient will continue to work with Medical illustrator and/or Social Worker to address care management and care coordination needs related to DMII as evidenced by adherence to care management team scheduled appointments      Interventions:   Diabetes Interventions: Assessed patient's understanding of A1c goal: <7% Provided education to patient about basic DM disease process Review of patient status, including review of consultants reports, relevant laboratory and other test results, and medication reconciliation completed with no discrepancies noted  Assessed for symptoms related to hypo and hyperglycemia and importance of correct treatment Advised patient, providing education and rationale, to check cbg before meals and at bedtime and or if you feel your blood sugar is too low or too high record, calling PCP for findings outside established parameters Counseled on Diabetic diet, my plate method and using portion control, discussed specific food groups to limit and to add  Assessed social determinant of health barriers Lab Results  Component Value Date   HGBA1C 10.2 (H) 09/24/2023    Patient Self-Care Activities:  Attend all scheduled provider appointments Call pharmacy for medication refills 3-7 days in advance of running out of medications Call provider office for new concerns or questions   Take medications as prescribed   schedule appointment with eye doctor check blood sugar at prescribed times: before meals and at bedtime and when you have symptoms of low or high blood sugar check feet daily for cuts, sores or redness take the blood sugar log to all doctor visits drink 6 to 8 glasses of water each day fill half of plate with vegetables manage portion size keep feet up while sitting wash and dry feet carefully every day wear comfortable, cotton socks wear comfortable, well-fitting shoes  Plan:  Next PCP appointment scheduled for: 12/18/23 at 8:20 AM Telephone follow up appointment with care management team member scheduled for:  Friday, August 22 at 09:30 AM      VBCI RN Care Plan related to Hypertension       Problems:  Chronic Disease Management support and education needs related to HTN  Goal: Over the next 90 days the Patient will continue to work with Medical illustrator and/or Social Worker to address care management and care coordination needs related to HTN as evidenced by adherence to care management team scheduled appointments      Interventions:   Hypertension Interventions: Last practice recorded BP readings:  BP Readings from Last 3 Encounters:  10/11/23 130/70  09/28/23 (!) 139/56  09/19/23 120/80   Most recent eGFR/CrCl:  Lab Results  Component Value Date   EGFR 63 08/15/2023    No components found for: CRCL  Evaluation of current treatment plan related to hypertension self management and patient's adherence to plan as established by  provider Reviewed medications with patient and discussed importance of compliance Counseled on the importance of exercise goals with target of 150 minutes per week Counseled patient about importance of wearing her CPAP machine exactly as directed  Provided education on prescribed low Sodium diet  Sent secure message to [facility] to request a BP cuff if available, be provided to patient  Discussed plans  with patient for ongoing care management follow up and provided patient with direct contact information for care management team  Patient Self-Care Activities:  Attend all scheduled provider appointments Call pharmacy for medication refills 3-7 days in advance of running out of medications Call provider office for new concerns or questions  Take medications as prescribed   call doctor for signs and symptoms of high blood pressure begin an exercise program report new symptoms to your doctor Use your CPAP machine exactly as directed   Plan:  Next PCP appointment scheduled for: 12/18/23 at 8:20 AM Telephone follow up appointment with care management team member scheduled for:  Friday, August 22 at 09:30 AM         Please call 1-800-273-TALK (toll free, 24 hour hotline) if you are experiencing a Mental Health or Behavioral Health Crisis or need someone to talk to.  Patient verbalizes understanding of instructions and care plan provided today and agrees to view in MyChart. Active MyChart status and patient understanding of how to access instructions and care plan via MyChart confirmed with patient.     Clayborne Ly RN BSN CCM [place]  [facility], [facility] Nurse Care Coordinator  Direct Dial: [phone] Website: [email or web address]

## 2023-11-14 DIAGNOSIS — E1165 Type 2 diabetes mellitus with hyperglycemia: Secondary | ICD-10-CM | POA: Diagnosis not present

## 2023-11-14 DIAGNOSIS — E1151 Type 2 diabetes mellitus with diabetic peripheral angiopathy without gangrene: Secondary | ICD-10-CM | POA: Diagnosis not present

## 2023-11-14 DIAGNOSIS — Z79899 Other long term (current) drug therapy: Secondary | ICD-10-CM | POA: Diagnosis not present

## 2023-11-14 DIAGNOSIS — Z9981 Dependence on supplemental oxygen: Secondary | ICD-10-CM | POA: Diagnosis not present

## 2023-11-14 DIAGNOSIS — A4189 Other specified sepsis: Secondary | ICD-10-CM | POA: Diagnosis not present

## 2023-11-14 DIAGNOSIS — M25562 Pain in left knee: Secondary | ICD-10-CM | POA: Diagnosis not present

## 2023-11-14 DIAGNOSIS — Z7951 Long term (current) use of inhaled steroids: Secondary | ICD-10-CM | POA: Diagnosis not present

## 2023-11-14 DIAGNOSIS — H6123 Impacted cerumen, bilateral: Secondary | ICD-10-CM | POA: Diagnosis not present

## 2023-11-14 DIAGNOSIS — E559 Vitamin D deficiency, unspecified: Secondary | ICD-10-CM | POA: Diagnosis not present

## 2023-11-14 DIAGNOSIS — E7800 Pure hypercholesterolemia, unspecified: Secondary | ICD-10-CM | POA: Diagnosis not present

## 2023-11-14 DIAGNOSIS — M25561 Pain in right knee: Secondary | ICD-10-CM | POA: Diagnosis not present

## 2023-11-14 DIAGNOSIS — Z7984 Long term (current) use of oral hypoglycemic drugs: Secondary | ICD-10-CM | POA: Diagnosis not present

## 2023-11-14 DIAGNOSIS — I119 Hypertensive heart disease without heart failure: Secondary | ICD-10-CM | POA: Diagnosis not present

## 2023-11-14 DIAGNOSIS — M109 Gout, unspecified: Secondary | ICD-10-CM | POA: Diagnosis not present

## 2023-11-14 DIAGNOSIS — J302 Other seasonal allergic rhinitis: Secondary | ICD-10-CM | POA: Diagnosis not present

## 2023-11-14 DIAGNOSIS — I700 Atherosclerosis of aorta: Secondary | ICD-10-CM | POA: Diagnosis not present

## 2023-11-14 DIAGNOSIS — H401131 Primary open-angle glaucoma, bilateral, mild stage: Secondary | ICD-10-CM | POA: Diagnosis not present

## 2023-11-14 DIAGNOSIS — M1990 Unspecified osteoarthritis, unspecified site: Secondary | ICD-10-CM | POA: Diagnosis not present

## 2023-11-14 DIAGNOSIS — G8929 Other chronic pain: Secondary | ICD-10-CM | POA: Diagnosis not present

## 2023-11-14 DIAGNOSIS — H9192 Unspecified hearing loss, left ear: Secondary | ICD-10-CM | POA: Diagnosis not present

## 2023-11-14 DIAGNOSIS — B348 Other viral infections of unspecified site: Secondary | ICD-10-CM | POA: Diagnosis not present

## 2023-11-14 DIAGNOSIS — M6281 Muscle weakness (generalized): Secondary | ICD-10-CM | POA: Diagnosis not present

## 2023-11-15 ENCOUNTER — Encounter: Payer: Self-pay | Admitting: Pharmacist

## 2023-11-15 DIAGNOSIS — M1990 Unspecified osteoarthritis, unspecified site: Secondary | ICD-10-CM | POA: Diagnosis not present

## 2023-11-15 DIAGNOSIS — M25562 Pain in left knee: Secondary | ICD-10-CM | POA: Diagnosis not present

## 2023-11-15 DIAGNOSIS — I700 Atherosclerosis of aorta: Secondary | ICD-10-CM | POA: Diagnosis not present

## 2023-11-15 DIAGNOSIS — A4189 Other specified sepsis: Secondary | ICD-10-CM | POA: Diagnosis not present

## 2023-11-15 DIAGNOSIS — H401131 Primary open-angle glaucoma, bilateral, mild stage: Secondary | ICD-10-CM | POA: Diagnosis not present

## 2023-11-15 DIAGNOSIS — E559 Vitamin D deficiency, unspecified: Secondary | ICD-10-CM | POA: Diagnosis not present

## 2023-11-15 DIAGNOSIS — Z9981 Dependence on supplemental oxygen: Secondary | ICD-10-CM | POA: Diagnosis not present

## 2023-11-15 DIAGNOSIS — E7800 Pure hypercholesterolemia, unspecified: Secondary | ICD-10-CM | POA: Diagnosis not present

## 2023-11-15 DIAGNOSIS — G8929 Other chronic pain: Secondary | ICD-10-CM | POA: Diagnosis not present

## 2023-11-15 DIAGNOSIS — B348 Other viral infections of unspecified site: Secondary | ICD-10-CM | POA: Diagnosis not present

## 2023-11-15 DIAGNOSIS — E1165 Type 2 diabetes mellitus with hyperglycemia: Secondary | ICD-10-CM | POA: Diagnosis not present

## 2023-11-15 DIAGNOSIS — H9192 Unspecified hearing loss, left ear: Secondary | ICD-10-CM | POA: Diagnosis not present

## 2023-11-15 DIAGNOSIS — J302 Other seasonal allergic rhinitis: Secondary | ICD-10-CM | POA: Diagnosis not present

## 2023-11-15 DIAGNOSIS — Z79899 Other long term (current) drug therapy: Secondary | ICD-10-CM | POA: Diagnosis not present

## 2023-11-15 DIAGNOSIS — E1151 Type 2 diabetes mellitus with diabetic peripheral angiopathy without gangrene: Secondary | ICD-10-CM | POA: Diagnosis not present

## 2023-11-15 DIAGNOSIS — M109 Gout, unspecified: Secondary | ICD-10-CM | POA: Diagnosis not present

## 2023-11-15 DIAGNOSIS — Z7951 Long term (current) use of inhaled steroids: Secondary | ICD-10-CM | POA: Diagnosis not present

## 2023-11-15 DIAGNOSIS — Z7984 Long term (current) use of oral hypoglycemic drugs: Secondary | ICD-10-CM | POA: Diagnosis not present

## 2023-11-15 DIAGNOSIS — H6123 Impacted cerumen, bilateral: Secondary | ICD-10-CM | POA: Diagnosis not present

## 2023-11-15 DIAGNOSIS — M25561 Pain in right knee: Secondary | ICD-10-CM | POA: Diagnosis not present

## 2023-11-15 DIAGNOSIS — M6281 Muscle weakness (generalized): Secondary | ICD-10-CM | POA: Diagnosis not present

## 2023-11-15 DIAGNOSIS — I119 Hypertensive heart disease without heart failure: Secondary | ICD-10-CM | POA: Diagnosis not present

## 2023-11-19 DIAGNOSIS — M109 Gout, unspecified: Secondary | ICD-10-CM | POA: Diagnosis not present

## 2023-11-19 DIAGNOSIS — H401131 Primary open-angle glaucoma, bilateral, mild stage: Secondary | ICD-10-CM | POA: Diagnosis not present

## 2023-11-19 DIAGNOSIS — J302 Other seasonal allergic rhinitis: Secondary | ICD-10-CM | POA: Diagnosis not present

## 2023-11-19 DIAGNOSIS — H6123 Impacted cerumen, bilateral: Secondary | ICD-10-CM | POA: Diagnosis not present

## 2023-11-19 DIAGNOSIS — B348 Other viral infections of unspecified site: Secondary | ICD-10-CM | POA: Diagnosis not present

## 2023-11-19 DIAGNOSIS — A4189 Other specified sepsis: Secondary | ICD-10-CM | POA: Diagnosis not present

## 2023-11-19 DIAGNOSIS — G8929 Other chronic pain: Secondary | ICD-10-CM | POA: Diagnosis not present

## 2023-11-19 DIAGNOSIS — Z7951 Long term (current) use of inhaled steroids: Secondary | ICD-10-CM | POA: Diagnosis not present

## 2023-11-19 DIAGNOSIS — E1151 Type 2 diabetes mellitus with diabetic peripheral angiopathy without gangrene: Secondary | ICD-10-CM | POA: Diagnosis not present

## 2023-11-19 DIAGNOSIS — I119 Hypertensive heart disease without heart failure: Secondary | ICD-10-CM | POA: Diagnosis not present

## 2023-11-19 DIAGNOSIS — E559 Vitamin D deficiency, unspecified: Secondary | ICD-10-CM | POA: Diagnosis not present

## 2023-11-19 DIAGNOSIS — E7800 Pure hypercholesterolemia, unspecified: Secondary | ICD-10-CM | POA: Diagnosis not present

## 2023-11-19 DIAGNOSIS — M6281 Muscle weakness (generalized): Secondary | ICD-10-CM | POA: Diagnosis not present

## 2023-11-19 DIAGNOSIS — E1165 Type 2 diabetes mellitus with hyperglycemia: Secondary | ICD-10-CM | POA: Diagnosis not present

## 2023-11-19 DIAGNOSIS — H9192 Unspecified hearing loss, left ear: Secondary | ICD-10-CM | POA: Diagnosis not present

## 2023-11-19 DIAGNOSIS — Z7984 Long term (current) use of oral hypoglycemic drugs: Secondary | ICD-10-CM | POA: Diagnosis not present

## 2023-11-19 DIAGNOSIS — M1990 Unspecified osteoarthritis, unspecified site: Secondary | ICD-10-CM | POA: Diagnosis not present

## 2023-11-19 DIAGNOSIS — M25561 Pain in right knee: Secondary | ICD-10-CM | POA: Diagnosis not present

## 2023-11-19 DIAGNOSIS — Z9981 Dependence on supplemental oxygen: Secondary | ICD-10-CM | POA: Diagnosis not present

## 2023-11-19 DIAGNOSIS — I700 Atherosclerosis of aorta: Secondary | ICD-10-CM | POA: Diagnosis not present

## 2023-11-19 DIAGNOSIS — Z79899 Other long term (current) drug therapy: Secondary | ICD-10-CM | POA: Diagnosis not present

## 2023-11-19 DIAGNOSIS — M25562 Pain in left knee: Secondary | ICD-10-CM | POA: Diagnosis not present

## 2023-11-21 DIAGNOSIS — G8929 Other chronic pain: Secondary | ICD-10-CM | POA: Diagnosis not present

## 2023-11-21 DIAGNOSIS — M6281 Muscle weakness (generalized): Secondary | ICD-10-CM | POA: Diagnosis not present

## 2023-11-21 DIAGNOSIS — E1151 Type 2 diabetes mellitus with diabetic peripheral angiopathy without gangrene: Secondary | ICD-10-CM | POA: Diagnosis not present

## 2023-11-21 DIAGNOSIS — Z7951 Long term (current) use of inhaled steroids: Secondary | ICD-10-CM | POA: Diagnosis not present

## 2023-11-21 DIAGNOSIS — E559 Vitamin D deficiency, unspecified: Secondary | ICD-10-CM | POA: Diagnosis not present

## 2023-11-21 DIAGNOSIS — M25561 Pain in right knee: Secondary | ICD-10-CM | POA: Diagnosis not present

## 2023-11-21 DIAGNOSIS — E7800 Pure hypercholesterolemia, unspecified: Secondary | ICD-10-CM | POA: Diagnosis not present

## 2023-11-21 DIAGNOSIS — I700 Atherosclerosis of aorta: Secondary | ICD-10-CM | POA: Diagnosis not present

## 2023-11-21 DIAGNOSIS — A4189 Other specified sepsis: Secondary | ICD-10-CM | POA: Diagnosis not present

## 2023-11-21 DIAGNOSIS — B348 Other viral infections of unspecified site: Secondary | ICD-10-CM | POA: Diagnosis not present

## 2023-11-21 DIAGNOSIS — M1990 Unspecified osteoarthritis, unspecified site: Secondary | ICD-10-CM | POA: Diagnosis not present

## 2023-11-21 DIAGNOSIS — I119 Hypertensive heart disease without heart failure: Secondary | ICD-10-CM | POA: Diagnosis not present

## 2023-11-21 DIAGNOSIS — E1165 Type 2 diabetes mellitus with hyperglycemia: Secondary | ICD-10-CM | POA: Diagnosis not present

## 2023-11-21 DIAGNOSIS — M109 Gout, unspecified: Secondary | ICD-10-CM | POA: Diagnosis not present

## 2023-11-21 DIAGNOSIS — Z9981 Dependence on supplemental oxygen: Secondary | ICD-10-CM | POA: Diagnosis not present

## 2023-11-21 DIAGNOSIS — Z79899 Other long term (current) drug therapy: Secondary | ICD-10-CM | POA: Diagnosis not present

## 2023-11-21 DIAGNOSIS — H401131 Primary open-angle glaucoma, bilateral, mild stage: Secondary | ICD-10-CM | POA: Diagnosis not present

## 2023-11-21 DIAGNOSIS — M25562 Pain in left knee: Secondary | ICD-10-CM | POA: Diagnosis not present

## 2023-11-21 DIAGNOSIS — H9192 Unspecified hearing loss, left ear: Secondary | ICD-10-CM | POA: Diagnosis not present

## 2023-11-21 DIAGNOSIS — H6123 Impacted cerumen, bilateral: Secondary | ICD-10-CM | POA: Diagnosis not present

## 2023-11-21 DIAGNOSIS — Z7984 Long term (current) use of oral hypoglycemic drugs: Secondary | ICD-10-CM | POA: Diagnosis not present

## 2023-11-21 DIAGNOSIS — J302 Other seasonal allergic rhinitis: Secondary | ICD-10-CM | POA: Diagnosis not present

## 2023-11-21 NOTE — Progress Notes (Signed)
 error

## 2023-11-22 DIAGNOSIS — E1165 Type 2 diabetes mellitus with hyperglycemia: Secondary | ICD-10-CM | POA: Diagnosis not present

## 2023-11-22 DIAGNOSIS — M1990 Unspecified osteoarthritis, unspecified site: Secondary | ICD-10-CM | POA: Diagnosis not present

## 2023-11-22 DIAGNOSIS — H6123 Impacted cerumen, bilateral: Secondary | ICD-10-CM | POA: Diagnosis not present

## 2023-11-22 DIAGNOSIS — Z79899 Other long term (current) drug therapy: Secondary | ICD-10-CM | POA: Diagnosis not present

## 2023-11-22 DIAGNOSIS — M25561 Pain in right knee: Secondary | ICD-10-CM | POA: Diagnosis not present

## 2023-11-22 DIAGNOSIS — G4733 Obstructive sleep apnea (adult) (pediatric): Secondary | ICD-10-CM | POA: Diagnosis not present

## 2023-11-22 DIAGNOSIS — I700 Atherosclerosis of aorta: Secondary | ICD-10-CM | POA: Diagnosis not present

## 2023-11-22 DIAGNOSIS — Z9981 Dependence on supplemental oxygen: Secondary | ICD-10-CM | POA: Diagnosis not present

## 2023-11-22 DIAGNOSIS — H9192 Unspecified hearing loss, left ear: Secondary | ICD-10-CM | POA: Diagnosis not present

## 2023-11-22 DIAGNOSIS — B348 Other viral infections of unspecified site: Secondary | ICD-10-CM | POA: Diagnosis not present

## 2023-11-22 DIAGNOSIS — M109 Gout, unspecified: Secondary | ICD-10-CM | POA: Diagnosis not present

## 2023-11-22 DIAGNOSIS — I119 Hypertensive heart disease without heart failure: Secondary | ICD-10-CM | POA: Diagnosis not present

## 2023-11-22 DIAGNOSIS — J302 Other seasonal allergic rhinitis: Secondary | ICD-10-CM | POA: Diagnosis not present

## 2023-11-22 DIAGNOSIS — M6281 Muscle weakness (generalized): Secondary | ICD-10-CM | POA: Diagnosis not present

## 2023-11-22 DIAGNOSIS — A4189 Other specified sepsis: Secondary | ICD-10-CM | POA: Diagnosis not present

## 2023-11-22 DIAGNOSIS — G8929 Other chronic pain: Secondary | ICD-10-CM | POA: Diagnosis not present

## 2023-11-22 DIAGNOSIS — E1151 Type 2 diabetes mellitus with diabetic peripheral angiopathy without gangrene: Secondary | ICD-10-CM | POA: Diagnosis not present

## 2023-11-22 DIAGNOSIS — E559 Vitamin D deficiency, unspecified: Secondary | ICD-10-CM | POA: Diagnosis not present

## 2023-11-22 DIAGNOSIS — Z7984 Long term (current) use of oral hypoglycemic drugs: Secondary | ICD-10-CM | POA: Diagnosis not present

## 2023-11-22 DIAGNOSIS — H401131 Primary open-angle glaucoma, bilateral, mild stage: Secondary | ICD-10-CM | POA: Diagnosis not present

## 2023-11-22 DIAGNOSIS — Z7951 Long term (current) use of inhaled steroids: Secondary | ICD-10-CM | POA: Diagnosis not present

## 2023-11-22 DIAGNOSIS — E7800 Pure hypercholesterolemia, unspecified: Secondary | ICD-10-CM | POA: Diagnosis not present

## 2023-11-22 DIAGNOSIS — M25562 Pain in left knee: Secondary | ICD-10-CM | POA: Diagnosis not present

## 2023-11-26 DIAGNOSIS — M25561 Pain in right knee: Secondary | ICD-10-CM | POA: Diagnosis not present

## 2023-11-26 DIAGNOSIS — I119 Hypertensive heart disease without heart failure: Secondary | ICD-10-CM | POA: Diagnosis not present

## 2023-11-26 DIAGNOSIS — M6281 Muscle weakness (generalized): Secondary | ICD-10-CM | POA: Diagnosis not present

## 2023-11-26 DIAGNOSIS — A4189 Other specified sepsis: Secondary | ICD-10-CM | POA: Diagnosis not present

## 2023-11-26 DIAGNOSIS — Z9981 Dependence on supplemental oxygen: Secondary | ICD-10-CM | POA: Diagnosis not present

## 2023-11-26 DIAGNOSIS — E559 Vitamin D deficiency, unspecified: Secondary | ICD-10-CM | POA: Diagnosis not present

## 2023-11-26 DIAGNOSIS — E1165 Type 2 diabetes mellitus with hyperglycemia: Secondary | ICD-10-CM | POA: Diagnosis not present

## 2023-11-26 DIAGNOSIS — Z7951 Long term (current) use of inhaled steroids: Secondary | ICD-10-CM | POA: Diagnosis not present

## 2023-11-26 DIAGNOSIS — B348 Other viral infections of unspecified site: Secondary | ICD-10-CM | POA: Diagnosis not present

## 2023-11-26 DIAGNOSIS — M1990 Unspecified osteoarthritis, unspecified site: Secondary | ICD-10-CM | POA: Diagnosis not present

## 2023-11-26 DIAGNOSIS — J302 Other seasonal allergic rhinitis: Secondary | ICD-10-CM | POA: Diagnosis not present

## 2023-11-26 DIAGNOSIS — Z79899 Other long term (current) drug therapy: Secondary | ICD-10-CM | POA: Diagnosis not present

## 2023-11-26 DIAGNOSIS — E7800 Pure hypercholesterolemia, unspecified: Secondary | ICD-10-CM | POA: Diagnosis not present

## 2023-11-26 DIAGNOSIS — Z7984 Long term (current) use of oral hypoglycemic drugs: Secondary | ICD-10-CM | POA: Diagnosis not present

## 2023-11-26 DIAGNOSIS — M25562 Pain in left knee: Secondary | ICD-10-CM | POA: Diagnosis not present

## 2023-11-26 DIAGNOSIS — H6123 Impacted cerumen, bilateral: Secondary | ICD-10-CM | POA: Diagnosis not present

## 2023-11-26 DIAGNOSIS — H9192 Unspecified hearing loss, left ear: Secondary | ICD-10-CM | POA: Diagnosis not present

## 2023-11-26 DIAGNOSIS — M109 Gout, unspecified: Secondary | ICD-10-CM | POA: Diagnosis not present

## 2023-11-26 DIAGNOSIS — I700 Atherosclerosis of aorta: Secondary | ICD-10-CM | POA: Diagnosis not present

## 2023-11-26 DIAGNOSIS — G8929 Other chronic pain: Secondary | ICD-10-CM | POA: Diagnosis not present

## 2023-11-26 DIAGNOSIS — H401131 Primary open-angle glaucoma, bilateral, mild stage: Secondary | ICD-10-CM | POA: Diagnosis not present

## 2023-11-26 DIAGNOSIS — E1151 Type 2 diabetes mellitus with diabetic peripheral angiopathy without gangrene: Secondary | ICD-10-CM | POA: Diagnosis not present

## 2023-11-27 DIAGNOSIS — E1151 Type 2 diabetes mellitus with diabetic peripheral angiopathy without gangrene: Secondary | ICD-10-CM | POA: Diagnosis not present

## 2023-11-27 DIAGNOSIS — Z9981 Dependence on supplemental oxygen: Secondary | ICD-10-CM | POA: Diagnosis not present

## 2023-11-27 DIAGNOSIS — I700 Atherosclerosis of aorta: Secondary | ICD-10-CM | POA: Diagnosis not present

## 2023-11-27 DIAGNOSIS — G8929 Other chronic pain: Secondary | ICD-10-CM | POA: Diagnosis not present

## 2023-11-27 DIAGNOSIS — E1165 Type 2 diabetes mellitus with hyperglycemia: Secondary | ICD-10-CM | POA: Diagnosis not present

## 2023-11-27 DIAGNOSIS — Z7951 Long term (current) use of inhaled steroids: Secondary | ICD-10-CM | POA: Diagnosis not present

## 2023-11-27 DIAGNOSIS — E7800 Pure hypercholesterolemia, unspecified: Secondary | ICD-10-CM | POA: Diagnosis not present

## 2023-11-27 DIAGNOSIS — J302 Other seasonal allergic rhinitis: Secondary | ICD-10-CM | POA: Diagnosis not present

## 2023-11-27 DIAGNOSIS — I119 Hypertensive heart disease without heart failure: Secondary | ICD-10-CM | POA: Diagnosis not present

## 2023-11-27 DIAGNOSIS — Z79899 Other long term (current) drug therapy: Secondary | ICD-10-CM | POA: Diagnosis not present

## 2023-11-27 DIAGNOSIS — M1990 Unspecified osteoarthritis, unspecified site: Secondary | ICD-10-CM | POA: Diagnosis not present

## 2023-11-27 DIAGNOSIS — H9192 Unspecified hearing loss, left ear: Secondary | ICD-10-CM | POA: Diagnosis not present

## 2023-11-27 DIAGNOSIS — M25562 Pain in left knee: Secondary | ICD-10-CM | POA: Diagnosis not present

## 2023-11-27 DIAGNOSIS — M6281 Muscle weakness (generalized): Secondary | ICD-10-CM | POA: Diagnosis not present

## 2023-11-27 DIAGNOSIS — E559 Vitamin D deficiency, unspecified: Secondary | ICD-10-CM | POA: Diagnosis not present

## 2023-11-27 DIAGNOSIS — A4189 Other specified sepsis: Secondary | ICD-10-CM | POA: Diagnosis not present

## 2023-11-27 DIAGNOSIS — B348 Other viral infections of unspecified site: Secondary | ICD-10-CM | POA: Diagnosis not present

## 2023-11-27 DIAGNOSIS — M109 Gout, unspecified: Secondary | ICD-10-CM | POA: Diagnosis not present

## 2023-11-27 DIAGNOSIS — H401131 Primary open-angle glaucoma, bilateral, mild stage: Secondary | ICD-10-CM | POA: Diagnosis not present

## 2023-11-27 DIAGNOSIS — H6123 Impacted cerumen, bilateral: Secondary | ICD-10-CM | POA: Diagnosis not present

## 2023-11-27 DIAGNOSIS — M25561 Pain in right knee: Secondary | ICD-10-CM | POA: Diagnosis not present

## 2023-11-27 DIAGNOSIS — Z7984 Long term (current) use of oral hypoglycemic drugs: Secondary | ICD-10-CM | POA: Diagnosis not present

## 2023-12-06 DIAGNOSIS — Z79899 Other long term (current) drug therapy: Secondary | ICD-10-CM | POA: Diagnosis not present

## 2023-12-06 DIAGNOSIS — Z5982 Transportation insecurity: Secondary | ICD-10-CM | POA: Diagnosis not present

## 2023-12-06 DIAGNOSIS — G8929 Other chronic pain: Secondary | ICD-10-CM | POA: Diagnosis not present

## 2023-12-06 DIAGNOSIS — Z7984 Long term (current) use of oral hypoglycemic drugs: Secondary | ICD-10-CM | POA: Diagnosis not present

## 2023-12-06 DIAGNOSIS — I119 Hypertensive heart disease without heart failure: Secondary | ICD-10-CM | POA: Diagnosis not present

## 2023-12-06 DIAGNOSIS — Z9981 Dependence on supplemental oxygen: Secondary | ICD-10-CM | POA: Diagnosis not present

## 2023-12-06 DIAGNOSIS — Z87891 Personal history of nicotine dependence: Secondary | ICD-10-CM | POA: Diagnosis not present

## 2023-12-06 DIAGNOSIS — M1990 Unspecified osteoarthritis, unspecified site: Secondary | ICD-10-CM | POA: Diagnosis not present

## 2023-12-06 DIAGNOSIS — Z7951 Long term (current) use of inhaled steroids: Secondary | ICD-10-CM | POA: Diagnosis not present

## 2023-12-06 DIAGNOSIS — H6123 Impacted cerumen, bilateral: Secondary | ICD-10-CM | POA: Diagnosis not present

## 2023-12-06 DIAGNOSIS — E1165 Type 2 diabetes mellitus with hyperglycemia: Secondary | ICD-10-CM | POA: Diagnosis not present

## 2023-12-06 DIAGNOSIS — E559 Vitamin D deficiency, unspecified: Secondary | ICD-10-CM | POA: Diagnosis not present

## 2023-12-06 DIAGNOSIS — H9192 Unspecified hearing loss, left ear: Secondary | ICD-10-CM | POA: Diagnosis not present

## 2023-12-06 DIAGNOSIS — E1151 Type 2 diabetes mellitus with diabetic peripheral angiopathy without gangrene: Secondary | ICD-10-CM | POA: Diagnosis not present

## 2023-12-06 DIAGNOSIS — H401131 Primary open-angle glaucoma, bilateral, mild stage: Secondary | ICD-10-CM | POA: Diagnosis not present

## 2023-12-06 DIAGNOSIS — I700 Atherosclerosis of aorta: Secondary | ICD-10-CM | POA: Diagnosis not present

## 2023-12-06 DIAGNOSIS — M25562 Pain in left knee: Secondary | ICD-10-CM | POA: Diagnosis not present

## 2023-12-06 DIAGNOSIS — M109 Gout, unspecified: Secondary | ICD-10-CM | POA: Diagnosis not present

## 2023-12-06 DIAGNOSIS — M25561 Pain in right knee: Secondary | ICD-10-CM | POA: Diagnosis not present

## 2023-12-06 DIAGNOSIS — E7800 Pure hypercholesterolemia, unspecified: Secondary | ICD-10-CM | POA: Diagnosis not present

## 2023-12-06 DIAGNOSIS — J302 Other seasonal allergic rhinitis: Secondary | ICD-10-CM | POA: Diagnosis not present

## 2023-12-07 ENCOUNTER — Other Ambulatory Visit: Payer: Self-pay

## 2023-12-07 NOTE — Patient Outreach (Signed)
 Complex Care Management   Visit Note  12/07/2023  Name:  Joanna Sandoval MRN: [id number] DOB: [birth year]  Situation: Referral received for Complex Care Management related to DMII, OSA w/CPAP, cough. I obtained verbal consent from Patient.  Visit completed with Patient  on the phone.  Background:   Past Medical History:  Diagnosis Date   Arthritis    Diabetes mellitus    Hypercholesteremia    Hypertension    Post-menopausal bleeding    Postmenopausal bleeding 08/30/2011   Normal EBX 09/2011.    Assessment: Patient Reported Symptoms:  Cognitive Cognitive Status: Alert and oriented to person, place, and time, Normal speech and language skills Cognitive/Intellectual Conditions Management [RPT]: None reported or documented in medical history or problem list   Health Maintenance Behaviors: Annual physical exam, Healthy diet Health Facilitated by: Healthy diet, Rest  Neurological Neurological Review of Symptoms: Other: [place] Neurological Symptoms/Conditions [RPT]: tremors to right hand Neurological Management Strategies: Adequate rest, Medication therapy, Routine screening Neurological Self-Management Outcome: 3 (uncertain)  HEENT HEENT Symptoms Reported: Change or loss of hearing HEENT Management Strategies: Medical device HEENT Self-Management Outcome: 4 (good)    Cardiovascular Cardiovascular Symptoms Reported: No symptoms reported    Respiratory Respiratory Symptoms Reported: Productive cough Respiratory Management Strategies: Routine screening, Medication therapy Respiratory Self-Management Outcome: 3 (uncertain)  Endocrine Endocrine Symptoms Reported: No symptoms reported Is patient diabetic?: Yes Is patient checking blood sugars at home?: Yes List most recent blood sugar readings, include date and time of day: FBS 140 Endocrine Self-Management Outcome: 4 (good)  Gastrointestinal Gastrointestinal Symptoms Reported: No symptoms reported      Genitourinary Genitourinary  Symptoms Reported: No symptoms reported    Integumentary Integumentary Symptoms Reported: No symptoms reported    Musculoskeletal Musculoskelatal Symptoms Reviewed: Limited mobility, Difficulty walking, Unsteady gait Musculoskeletal Management Strategies: Adequate rest, Exercise, Medical device, Routine screening Falls in the past year?: Yes (patient had a fall on July 5) Number of falls in past year: 1 or less Was there an injury with Fall?: No Fall Risk Category Calculator: 1 Patient Fall Risk Level: Low Fall Risk Patient at Risk for Falls Due to: History of fall(s), Impaired balance/gait, Impaired mobility Fall risk Follow up: Falls evaluation completed, Education provided, Falls prevention discussed  Psychosocial Psychosocial Symptoms Reported: No symptoms reported     Quality of Family Relationships: involved, supportive, helpful Do you feel physically threatened by others?: No    12/07/2023    PHQ2-9 Depression Screening   Joanna Sandoval interest or pleasure in doing things    Feeling down, depressed, or hopeless    PHQ-2 - Total Score    Trouble falling or staying asleep, or sleeping too much    Feeling tired or having Joanna Sandoval energy    Poor appetite or overeating     Feeling bad about yourself - or that you are a failure or have let yourself or your family down    Trouble concentrating on things, such as reading the newspaper or watching television    Moving or speaking so slowly that other people could have noticed.  Or the opposite - being so fidgety or restless that you have been moving around a lot more than usual    Thoughts that you would be better off dead, or hurting yourself in some way    PHQ2-9 Total Score    If you checked off any problems, how difficult have these problems made it for you to do your work, take care of things at home, or get along  with other people    Depression Interventions/Treatment      There were no vitals filed for this visit.  Medications  Reviewed Today     Reviewed by [name], RN (Registered Nurse) on 12/07/23 at [phone]  Med List Status: <None>   Medication Order Taking? Sig Documenting Provider Last Dose Status Informant  Accu-Chek Softclix Lancets lancets [id number]  USE AS DIRECTED Georgina Speaks, FNP  Active Self, Pharmacy Records  albuterol  (VENTOLIN  HFA) [phone] Base) MCG/ACT inhaler [id number]  Inhale 2 puffs into the lungs every 6 (six) hours as needed. Georgina Speaks, FNP  Active   atenolol  (TENORMIN ) 100 MG tablet [id number]  Take 100 mg by mouth daily. [provider]  Active Self, Pharmacy Records  atorvastatin  (LIPITOR) 40 MG tablet [id number]  Take 40 mg by mouth at bedtime. [provider]  Active Self, Pharmacy Records  benzonatate  (TESSALON ) 100 MG capsule [id number]  Take 1 capsule (100 mg total) by mouth 2 (two) times daily as needed (Throat soothing). Von Bellis, MD  Active   Blood Glucose Monitoring Suppl (ACCU-CHEK GUIDE) w/Device KIT [id number]  USE TO CHECK BLOOD SUGAR TWICE  DAILY AT 10AM AND 5 PM  Patient not taking: Reported on 10/31/2023   Georgina Speaks, FNP  Active Self, Pharmacy Records  Blood Pressure Monitoring (ADULT BLOOD PRESSURE CUFF LG) KIT [id number]  Take blood pressure daily and as needed  Patient not taking: Reported on 10/31/2023   Georgina Speaks, FNP  Active Self, Pharmacy Records  D3-50 50000 units capsule [id number]  Take 50,000 Units by mouth once a week. [provider]  Active Self, Pharmacy Records  dapagliflozin  propanediol (FARXIGA ) 10 MG TABS tablet [id number]  Take 1 tablet (10 mg total) by mouth daily before breakfast. Georgina Speaks, FNP  Active   dorzolamide -timolol  (COSOPT ) 2-0.5 % ophthalmic solution [id number]  Place 1 drop into both eyes 2 (two) times daily. [provider]  Active Self, Pharmacy Records  empagliflozin  (JARDIANCE ) 10 MG TABS tablet [id number]  Take 1 tablet (10 mg total) by mouth daily. Georgina Speaks, FNP  Active   fluticasone   (FLONASE ) 50 MCG/ACT nasal spray [id number]  Place 2 sprays into both nostrils daily. Georgina Speaks, FNP  Active Self, Pharmacy Records  fluticasone  furoate-vilanterol (BREO ELLIPTA ) 200-25 MCG/ACT AEPB [id number]  Inhale 1 puff into the lungs daily. Von Bellis, MD  Active   glimepiride  (AMARYL ) 2 MG tablet [id number]  TAKE 1 TABLET BY MOUTH IN THE  MORNING AND AT BEDTIME Georgina Speaks, FNP  Active Self, Pharmacy Records  glucose blood (ACCU-CHEK GUIDE) test strip [id number]  USE AS DIRECTED Georgina Speaks, FNP  Active Self, Pharmacy Records  guaiFENesin -dextromethorphan  (ROBITUSSIN DM) 100-10 MG/5ML syrup [id number]  Take 10 mLs by mouth every 6 (six) hours as needed for cough. Von Bellis, MD  Active   Lancets Misc. [facility] FASTCLIX LANCET) KIT [id number]  Use to test blood sugar twice daily as directed. E11.65 Georgina Speaks, FNP  Active   latanoprost  (XALATAN ) 0.005 % ophthalmic solution [id number]  Place 1 drop into both eyes at bedtime. [provider]  Active Self, Pharmacy Records  losartan  (COZAAR ) 50 MG tablet [id number]  Take 50 mg by mouth daily. [provider]  Active Self, Pharmacy Records            Recommendation:   PCP Follow-up with Speaks Georgina FNP on 12/18/23 at 8:20 AM for a diabetes check  Specialty provider follow-up with Dr. Zaida with [facility] on 12/31/23 at 2:00 PM  Follow Up Plan:   Telephone follow up appointment date/time:  Friday, September 19 at 11:15 AM  Clayborne Ly RN BSN CCM [employer]  [facility], [facility] Nurse Care Coordinator  Direct Dial: [phone] Website: [email or web address]

## 2023-12-07 NOTE — Patient Instructions (Signed)
 Visit Information  Thank you for taking time to visit with me today. Please don't hesitate to contact me if I can be of assistance to you before our next scheduled appointment.  Your next care management appointment is by telephone on Friday, September 19 at 11:15 AM  Please call the care guide team at [phone] if you need to cancel, schedule, or reschedule an appointment.   Please call 1-800-273-TALK (toll free, 24 hour hotline) if you are experiencing a Mental Health or Behavioral Health Crisis or need someone to talk to.  Clayborne Ly RN BSN CCM [place]  [facility], [facility] Health Nurse Care Coordinator  Direct Dial: [phone] Website: [email or web address]

## 2023-12-11 DIAGNOSIS — E1165 Type 2 diabetes mellitus with hyperglycemia: Secondary | ICD-10-CM | POA: Diagnosis not present

## 2023-12-11 DIAGNOSIS — I119 Hypertensive heart disease without heart failure: Secondary | ICD-10-CM | POA: Diagnosis not present

## 2023-12-11 DIAGNOSIS — G8929 Other chronic pain: Secondary | ICD-10-CM | POA: Diagnosis not present

## 2023-12-11 DIAGNOSIS — Z7984 Long term (current) use of oral hypoglycemic drugs: Secondary | ICD-10-CM | POA: Diagnosis not present

## 2023-12-11 DIAGNOSIS — H9192 Unspecified hearing loss, left ear: Secondary | ICD-10-CM | POA: Diagnosis not present

## 2023-12-11 DIAGNOSIS — Z87891 Personal history of nicotine dependence: Secondary | ICD-10-CM | POA: Diagnosis not present

## 2023-12-11 DIAGNOSIS — M25562 Pain in left knee: Secondary | ICD-10-CM | POA: Diagnosis not present

## 2023-12-11 DIAGNOSIS — J302 Other seasonal allergic rhinitis: Secondary | ICD-10-CM | POA: Diagnosis not present

## 2023-12-11 DIAGNOSIS — Z5982 Transportation insecurity: Secondary | ICD-10-CM | POA: Diagnosis not present

## 2023-12-11 DIAGNOSIS — E1151 Type 2 diabetes mellitus with diabetic peripheral angiopathy without gangrene: Secondary | ICD-10-CM | POA: Diagnosis not present

## 2023-12-11 DIAGNOSIS — M1990 Unspecified osteoarthritis, unspecified site: Secondary | ICD-10-CM | POA: Diagnosis not present

## 2023-12-11 DIAGNOSIS — Z7951 Long term (current) use of inhaled steroids: Secondary | ICD-10-CM | POA: Diagnosis not present

## 2023-12-11 DIAGNOSIS — E559 Vitamin D deficiency, unspecified: Secondary | ICD-10-CM | POA: Diagnosis not present

## 2023-12-11 DIAGNOSIS — H6123 Impacted cerumen, bilateral: Secondary | ICD-10-CM | POA: Diagnosis not present

## 2023-12-11 DIAGNOSIS — M109 Gout, unspecified: Secondary | ICD-10-CM | POA: Diagnosis not present

## 2023-12-11 DIAGNOSIS — H401131 Primary open-angle glaucoma, bilateral, mild stage: Secondary | ICD-10-CM | POA: Diagnosis not present

## 2023-12-11 DIAGNOSIS — Z79899 Other long term (current) drug therapy: Secondary | ICD-10-CM | POA: Diagnosis not present

## 2023-12-11 DIAGNOSIS — I700 Atherosclerosis of aorta: Secondary | ICD-10-CM | POA: Diagnosis not present

## 2023-12-11 DIAGNOSIS — Z9981 Dependence on supplemental oxygen: Secondary | ICD-10-CM | POA: Diagnosis not present

## 2023-12-11 DIAGNOSIS — M25561 Pain in right knee: Secondary | ICD-10-CM | POA: Diagnosis not present

## 2023-12-11 DIAGNOSIS — E7800 Pure hypercholesterolemia, unspecified: Secondary | ICD-10-CM | POA: Diagnosis not present

## 2023-12-12 ENCOUNTER — Telehealth: Payer: Self-pay

## 2023-12-12 NOTE — Telephone Encounter (Signed)
 Patient was identified as falling into the [place] Measure - Diabetes.   Patient was: Appointment scheduled with primary care provider in the next 30 days.

## 2023-12-17 DIAGNOSIS — E1165 Type 2 diabetes mellitus with hyperglycemia: Secondary | ICD-10-CM | POA: Diagnosis not present

## 2023-12-17 DIAGNOSIS — I700 Atherosclerosis of aorta: Secondary | ICD-10-CM | POA: Diagnosis not present

## 2023-12-17 DIAGNOSIS — Z7951 Long term (current) use of inhaled steroids: Secondary | ICD-10-CM | POA: Diagnosis not present

## 2023-12-17 DIAGNOSIS — G8929 Other chronic pain: Secondary | ICD-10-CM | POA: Diagnosis not present

## 2023-12-17 DIAGNOSIS — Z9981 Dependence on supplemental oxygen: Secondary | ICD-10-CM | POA: Diagnosis not present

## 2023-12-17 DIAGNOSIS — E1151 Type 2 diabetes mellitus with diabetic peripheral angiopathy without gangrene: Secondary | ICD-10-CM | POA: Diagnosis not present

## 2023-12-17 DIAGNOSIS — H6123 Impacted cerumen, bilateral: Secondary | ICD-10-CM | POA: Diagnosis not present

## 2023-12-17 DIAGNOSIS — M25562 Pain in left knee: Secondary | ICD-10-CM | POA: Diagnosis not present

## 2023-12-17 DIAGNOSIS — Z79899 Other long term (current) drug therapy: Secondary | ICD-10-CM | POA: Diagnosis not present

## 2023-12-17 DIAGNOSIS — M1990 Unspecified osteoarthritis, unspecified site: Secondary | ICD-10-CM | POA: Diagnosis not present

## 2023-12-17 DIAGNOSIS — Z7984 Long term (current) use of oral hypoglycemic drugs: Secondary | ICD-10-CM | POA: Diagnosis not present

## 2023-12-17 DIAGNOSIS — J302 Other seasonal allergic rhinitis: Secondary | ICD-10-CM | POA: Diagnosis not present

## 2023-12-17 DIAGNOSIS — H401131 Primary open-angle glaucoma, bilateral, mild stage: Secondary | ICD-10-CM | POA: Diagnosis not present

## 2023-12-17 DIAGNOSIS — Z5982 Transportation insecurity: Secondary | ICD-10-CM | POA: Diagnosis not present

## 2023-12-17 DIAGNOSIS — Z87891 Personal history of nicotine dependence: Secondary | ICD-10-CM | POA: Diagnosis not present

## 2023-12-17 DIAGNOSIS — M25561 Pain in right knee: Secondary | ICD-10-CM | POA: Diagnosis not present

## 2023-12-17 DIAGNOSIS — I119 Hypertensive heart disease without heart failure: Secondary | ICD-10-CM | POA: Diagnosis not present

## 2023-12-17 DIAGNOSIS — H9192 Unspecified hearing loss, left ear: Secondary | ICD-10-CM | POA: Diagnosis not present

## 2023-12-17 DIAGNOSIS — E559 Vitamin D deficiency, unspecified: Secondary | ICD-10-CM | POA: Diagnosis not present

## 2023-12-17 DIAGNOSIS — E7800 Pure hypercholesterolemia, unspecified: Secondary | ICD-10-CM | POA: Diagnosis not present

## 2023-12-17 DIAGNOSIS — M109 Gout, unspecified: Secondary | ICD-10-CM | POA: Diagnosis not present

## 2023-12-18 ENCOUNTER — Encounter: Payer: Self-pay | Admitting: Nurse Practitioner

## 2023-12-18 ENCOUNTER — Ambulatory Visit: Admitting: Nurse Practitioner

## 2023-12-18 VITALS — BP 116/60 | HR 79 | Temp 97.8°F | Ht 62.0 in | Wt 198.0 lb

## 2023-12-18 DIAGNOSIS — E1151 Type 2 diabetes mellitus with diabetic peripheral angiopathy without gangrene: Secondary | ICD-10-CM | POA: Diagnosis not present

## 2023-12-18 DIAGNOSIS — Z79899 Other long term (current) drug therapy: Secondary | ICD-10-CM | POA: Diagnosis not present

## 2023-12-18 DIAGNOSIS — I700 Atherosclerosis of aorta: Secondary | ICD-10-CM | POA: Diagnosis not present

## 2023-12-18 DIAGNOSIS — Z23 Encounter for immunization: Secondary | ICD-10-CM

## 2023-12-18 DIAGNOSIS — E66812 Obesity, class 2: Secondary | ICD-10-CM

## 2023-12-18 DIAGNOSIS — Z6836 Body mass index (BMI) 36.0-36.9, adult: Secondary | ICD-10-CM

## 2023-12-18 DIAGNOSIS — E11319 Type 2 diabetes mellitus with unspecified diabetic retinopathy without macular edema: Secondary | ICD-10-CM

## 2023-12-18 DIAGNOSIS — I739 Peripheral vascular disease, unspecified: Secondary | ICD-10-CM

## 2023-12-18 DIAGNOSIS — I119 Hypertensive heart disease without heart failure: Secondary | ICD-10-CM | POA: Diagnosis not present

## 2023-12-18 NOTE — Assessment & Plan Note (Signed)
 Blood pressure is well controlled, continue current medications.

## 2023-12-18 NOTE — Assessment & Plan Note (Addendum)
 She is encouraged to strive for BMI less than 30 to decrease cardiac risk. Advised to aim for at least 150 minutes of exercise per week. She is currently in PT

## 2023-12-18 NOTE — Assessment & Plan Note (Signed)
 Continue statin, tolerating well

## 2023-12-18 NOTE — Progress Notes (Signed)
 I,Jameka J Llittleton, CMA,acting as a Neurosurgeon for [employer], FNP.,have documented all relevant documentation on the behalf of Joanna Ada, FNP,as directed by  Joanna Ada, FNP while in the presence of Joanna Ada, FNP.  Subjective:  Patient ID: Joanna Sandoval , female    DOB: 1946/04/10 , 78 y.o.   MRN: [id number]  Chief Complaint  Patient presents with   Hypertension   Diabetes    Patient presents today for a bp and dm follow up, Patient reports compliance with medication. Patient denies any chest pain, SOB, or headaches. Patient has no concerns today.     HPI Discussed the use of AI scribe software for clinical note transcription with the patient, who gave verbal consent to proceed.  History of Present Illness Joanna Sandoval is a 78 year old female with diabetes and respiratory issues who presents for a follow-up visit.  She has an upcoming appointment with a lung specialist on the 14th, which will be her first visit with her. She has not taken all her medications today because she prefers to take them at home rather than while driving. She has taken Farxiga  after eating but plans to take the rest of her medications later. She has taken Rybelsus  in the past but caused constipation.   She experiences difficulties with using her lung medication, Breo, particularly with the inhalation technique. Therapy has been working with her on this and will continue until mid-October. She has been given a 30-day extension for therapy. She wants to be more active and able to go outside.  She experiences tremors, particularly in the mornings, which she describes as 'all over the place.' These tremors are causing her distress. She also mentions concerns about her grandson, who is on oxygen and congested, which adds to her worry.  Her blood sugar levels are a concern, with fasting levels around 150 mg/dL and postprandial levels around 140 mg/dL. She is taking her medications as directed and is  trying to manage her activity levels as advised by her therapy team. She mentions a previous hospitalization in June and has not yet rescheduled appointments with her heart, eye, and orthopedic doctors, which were canceled due to her illness.  She practices careful hygiene, including hand washing and wearing gloves when getting gas, to prevent illness. She has been cautious about being around her grandson to avoid transmitting any illness. She mentions that her grandson is 21 pounds and she uses a playpen to help manage his care safely.  She is active in her church but limits her exposure to large groups to avoid illness. She has received her flu shot in the past and wants to receive both the flu and COVID vaccines to protect herself and her grandson. She is vigilant about wearing a mask and using hand sanitizer to minimize her risk of infection.   Hypertension This is a chronic problem. The current episode started more than 1 year ago. The problem is unchanged. The problem is controlled. Pertinent negatives include no anxiety, headaches or shortness of breath. There are no associated agents to hypertension. Risk factors for coronary artery disease include obesity and sedentary lifestyle. Past treatments include angiotensin blockers. Compliance problems include exercise.  Hypertensive end-organ damage includes retinopathy. There is no history of angina. There is no history of chronic renal disease.  Diabetes She presents for her follow-up diabetic visit. She has type 2 diabetes mellitus. Her disease course has been stable. Pertinent negatives for hypoglycemia include no dizziness or headaches. Pertinent negatives  for diabetes include no polydipsia, no polyphagia and no polyuria. There are no hypoglycemic complications. Diabetic complications include retinopathy.     Past Medical History:  Diagnosis Date   Arthritis    Diabetes mellitus    Hypercholesteremia    Hypertension    Post-menopausal  bleeding    Postmenopausal bleeding 08/30/2011   Normal EBX 09/2011.     Family History  Problem Relation Age of Onset   Hypertension Mother    Parkinsonism Father    Hypertension Father    Cancer Father        ?lung   Diabetes Sister      Current Outpatient Medications:    Accu-Chek Softclix Lancets lancets, USE AS DIRECTED, Disp: 200 each, Rfl: 2   albuterol  (VENTOLIN  HFA) 108 (90 Base) MCG/ACT inhaler, Inhale 2 puffs into the lungs every 6 (six) hours as needed., Disp: 18 g, Rfl: 1   atenolol  (TENORMIN ) 100 MG tablet, Take 100 mg by mouth daily., Disp: , Rfl:    atorvastatin  (LIPITOR) 40 MG tablet, Take 40 mg by mouth at bedtime., Disp: , Rfl:    benzonatate  (TESSALON ) 100 MG capsule, Take 1 capsule (100 mg total) by mouth 2 (two) times daily as needed (Throat soothing)., Disp: 20 capsule, Rfl: 0   Blood Glucose Monitoring Suppl (ACCU-CHEK GUIDE) w/Device KIT, USE TO CHECK BLOOD SUGAR TWICE  DAILY AT 10AM AND 5 PM, Disp: 1 kit, Rfl: 1   Blood Pressure Monitoring (ADULT BLOOD PRESSURE CUFF LG) KIT, Take blood pressure daily and as needed, Disp: 1 kit, Rfl: 0   D3-50 50000 units capsule, Take 50,000 Units by mouth once a week., Disp: , Rfl: 0   dapagliflozin  propanediol (FARXIGA ) 10 MG TABS tablet, Take 1 tablet (10 mg total) by mouth daily before breakfast., Disp: 100 tablet, Rfl: 2   dorzolamide -timolol  (COSOPT ) 2-0.5 % ophthalmic solution, Place 1 drop into both eyes 2 (two) times daily., Disp: , Rfl:    empagliflozin  (JARDIANCE ) 10 MG TABS tablet, Take 1 tablet (10 mg total) by mouth daily., Disp: 90 tablet, Rfl: 2   fluticasone  (FLONASE ) 50 MCG/ACT nasal spray, Place 2 sprays into both nostrils daily., Disp: 16 g, Rfl: 2   fluticasone  furoate-vilanterol (BREO ELLIPTA ) 200-25 MCG/ACT AEPB, Inhale 1 puff into the lungs daily., Disp: 60 each, Rfl: 11   glimepiride  (AMARYL ) 2 MG tablet, TAKE 1 TABLET BY MOUTH IN THE  MORNING AND AT BEDTIME, Disp: 200 tablet, Rfl: 2   glucose blood  (ACCU-CHEK GUIDE) test strip, USE AS DIRECTED, Disp: 200 strip, Rfl: 2   guaiFENesin -dextromethorphan  (ROBITUSSIN DM) 100-10 MG/5ML syrup, Take 10 mLs by mouth every 6 (six) hours as needed for cough., Disp: 118 mL, Rfl: 0   [name] KIT, Use to test blood sugar twice daily as directed. E11.65, Disp: 1 kit, Rfl: 12   latanoprost  (XALATAN ) 0.005 % ophthalmic solution, Place 1 drop into both eyes at bedtime., Disp: , Rfl:    losartan  (COZAAR ) 50 MG tablet, Take 50 mg by mouth daily., Disp: , Rfl:    Allergies  Allergen Reactions   Grass Pollen(K-O-R-T-Swt Vern) Other (See Comments)    Sneezing, watery eyes     Review of Systems  Constitutional: Negative.   Eyes: Negative.   Respiratory: Negative.  Negative for shortness of breath.   Cardiovascular: Negative.   Gastrointestinal: Negative.   Endocrine: Negative for polydipsia, polyphagia and polyuria.  Musculoskeletal: Negative.   Skin: Negative.   Neurological:  Negative for dizziness and  headaches.  Psychiatric/Behavioral: Negative.       Today's Vitals   12/18/23 0826  BP: 116/60  Pulse: 79  Temp: 97.8 F (36.6 C)  TempSrc: Oral  Weight: 198 lb (89.8 kg)  Height: 5' 2 (1.575 m)  PainSc: 0-No pain   Body mass index is 36.21 kg/m.  Wt Readings from Last 3 Encounters:  12/18/23 198 lb (89.8 kg)  10/11/23 190 lb (86.2 kg)  09/24/23 198 lb (89.8 kg)      Objective:  Physical Exam Vitals reviewed.  Constitutional:      General: She is not in acute distress.    Appearance: Normal appearance. She is obese.  HENT:     Head: Normocephalic.  Eyes:     Extraocular Movements: Extraocular movements intact.     Conjunctiva/sclera: Conjunctivae normal.     Pupils: Pupils are equal, round, and reactive to light.  Cardiovascular:     Rate and Rhythm: Normal rate and regular rhythm.     Pulses: Normal pulses.     Heart sounds: Normal heart sounds. No murmur heard. Pulmonary:     Effort:  Pulmonary effort is normal. No respiratory distress.     Breath sounds: Normal breath sounds. No wheezing.  Chest:     Chest wall: No mass.  Abdominal:     General: Abdomen is flat. Bowel sounds are normal. There is no distension.     Palpations: Abdomen is soft. There is no mass.     Tenderness: There is no abdominal tenderness.  Musculoskeletal:     Comments: Using a cane for support  Skin:    General: Skin is warm and dry.     Capillary Refill: Capillary refill takes less than 2 seconds.  Neurological:     General: No focal deficit present.     Mental Status: She is alert and oriented to person, place, and time.     Cranial Nerves: No cranial nerve deficit.     Motor: No weakness.  Psychiatric:        Mood and Affect: Mood normal.        Behavior: Behavior normal.        Thought Content: Thought content normal.        Judgment: Judgment normal.      Assessment And Plan:  Type 2 diabetes mellitus with diabetic peripheral angiopathy without gangrene, without long-term current use of insulin  (HCC) Assessment & Plan: hgbA1c was improved at last visit but still increased at 10.2, continue current medications. Will check HgbA1c.   Orders: -     CMP14+EGFR  Benign hypertensive heart disease without heart failure [I11.9] Assessment & Plan: Blood pressure is well controlled, continue current medications.   Orders: -     CMP14+EGFR  Aortic atherosclerosis (HCC) [I70.0] Assessment & Plan: Continue statin, tolerating well.  Orders: -     Lipid panel  PAD (peripheral artery disease) (HCC) Assessment & Plan: Chronic, continue statin.   Orders: -     Lipid panel  Class 2 severe obesity due to excess calories with serious comorbidity and body mass index (BMI) of 36.0 to 36.9 in adult [facility]) Assessment & Plan: She is encouraged to strive for BMI less than 30 to decrease cardiac risk. Advised to aim for at least 150 minutes of exercise per week. She is currently in  PT    Other long term (current) drug therapy -     CBC    Return for 4 month f/u diabetes; schedule influenza vaccine.  Patient was given opportunity to ask questions. Patient verbalized understanding of the plan and was able to repeat key elements of the plan. All questions were answered to their satisfaction.    LILLETTE Joanna Ada, FNP, have reviewed all documentation for this visit. The documentation on 12/18/23 for the exam, diagnosis, procedures, and orders are all accurate and complete.   IF YOU HAVE BEEN REFERRED TO A SPECIALIST, IT MAY TAKE 1-2 WEEKS TO SCHEDULE/PROCESS THE REFERRAL. IF YOU HAVE NOT HEARD FROM US /SPECIALIST IN TWO WEEKS, PLEASE GIVE US  A CALL AT [phone].

## 2023-12-18 NOTE — Assessment & Plan Note (Signed)
 hgbA1c was improved at last visit but still increased at 10.2, continue current medications. Will check HgbA1c.

## 2023-12-18 NOTE — Assessment & Plan Note (Signed)
Chronic, continue statin. 

## 2023-12-19 LAB — CBC
Hematocrit: 43.7 % (ref 34.0–46.6)
Hemoglobin: 13.8 g/dL (ref 11.1–15.9)
MCH: 28.5 pg (ref 26.6–33.0)
MCHC: 31.6 g/dL (ref 31.5–35.7)
MCV: 90 fL (ref 79–97)
Platelets: 263 x10E3/uL (ref 150–450)
RBC: 4.85 x10E6/uL (ref 3.77–5.28)
RDW: 17.6 % — ABNORMAL HIGH (ref 11.7–15.4)
WBC: 12.8 x10E3/uL — ABNORMAL HIGH (ref 3.4–10.8)

## 2023-12-19 LAB — CMP14+EGFR
ALT: 9 IU/L (ref 0–32)
AST: 12 IU/L (ref 0–40)
Albumin: 4.3 g/dL (ref 3.8–4.8)
Alkaline Phosphatase: 132 IU/L — ABNORMAL HIGH (ref 44–121)
BUN/Creatinine Ratio: 21 (ref 12–28)
BUN: 20 mg/dL (ref 8–27)
Bilirubin Total: 0.6 mg/dL (ref 0.0–1.2)
CO2: 27 mmol/L (ref 20–29)
Calcium: 10.5 mg/dL — ABNORMAL HIGH (ref 8.7–10.3)
Chloride: 103 mmol/L (ref 96–106)
Creatinine, Ser: 0.95 mg/dL (ref 0.57–1.00)
Globulin, Total: 2.7 g/dL (ref 1.5–4.5)
Glucose: 198 mg/dL — ABNORMAL HIGH (ref 70–99)
Potassium: 4.4 mmol/L (ref 3.5–5.2)
Sodium: 145 mmol/L — ABNORMAL HIGH (ref 134–144)
Total Protein: 7 g/dL (ref 6.0–8.5)
eGFR: 62 mL/min/1.73 (ref >59)

## 2023-12-19 LAB — LIPID PANEL
Chol/HDL Ratio: 2.8 ratio (ref 0.0–4.4)
Cholesterol, Total: 169 mg/dL (ref 100–199)
HDL: 60 mg/dL (ref >39)
LDL Chol Calc (NIH): 84 mg/dL (ref 0–99)
Triglycerides: 144 mg/dL (ref 0–149)
VLDL Cholesterol Cal: 25 mg/dL (ref 5–40)

## 2023-12-23 DIAGNOSIS — G4733 Obstructive sleep apnea (adult) (pediatric): Secondary | ICD-10-CM | POA: Diagnosis not present

## 2023-12-24 DIAGNOSIS — I700 Atherosclerosis of aorta: Secondary | ICD-10-CM | POA: Diagnosis not present

## 2023-12-24 DIAGNOSIS — G8929 Other chronic pain: Secondary | ICD-10-CM | POA: Diagnosis not present

## 2023-12-24 DIAGNOSIS — M109 Gout, unspecified: Secondary | ICD-10-CM | POA: Diagnosis not present

## 2023-12-24 DIAGNOSIS — Z79899 Other long term (current) drug therapy: Secondary | ICD-10-CM | POA: Diagnosis not present

## 2023-12-24 DIAGNOSIS — I119 Hypertensive heart disease without heart failure: Secondary | ICD-10-CM | POA: Diagnosis not present

## 2023-12-24 DIAGNOSIS — E1151 Type 2 diabetes mellitus with diabetic peripheral angiopathy without gangrene: Secondary | ICD-10-CM | POA: Diagnosis not present

## 2023-12-24 DIAGNOSIS — E559 Vitamin D deficiency, unspecified: Secondary | ICD-10-CM | POA: Diagnosis not present

## 2023-12-24 DIAGNOSIS — Z87891 Personal history of nicotine dependence: Secondary | ICD-10-CM | POA: Diagnosis not present

## 2023-12-24 DIAGNOSIS — H9192 Unspecified hearing loss, left ear: Secondary | ICD-10-CM | POA: Diagnosis not present

## 2023-12-24 DIAGNOSIS — Z9981 Dependence on supplemental oxygen: Secondary | ICD-10-CM | POA: Diagnosis not present

## 2023-12-24 DIAGNOSIS — H6123 Impacted cerumen, bilateral: Secondary | ICD-10-CM | POA: Diagnosis not present

## 2023-12-24 DIAGNOSIS — M1990 Unspecified osteoarthritis, unspecified site: Secondary | ICD-10-CM | POA: Diagnosis not present

## 2023-12-24 DIAGNOSIS — H401131 Primary open-angle glaucoma, bilateral, mild stage: Secondary | ICD-10-CM | POA: Diagnosis not present

## 2023-12-24 DIAGNOSIS — Z7951 Long term (current) use of inhaled steroids: Secondary | ICD-10-CM | POA: Diagnosis not present

## 2023-12-24 DIAGNOSIS — Z7984 Long term (current) use of oral hypoglycemic drugs: Secondary | ICD-10-CM | POA: Diagnosis not present

## 2023-12-24 DIAGNOSIS — Z5982 Transportation insecurity: Secondary | ICD-10-CM | POA: Diagnosis not present

## 2023-12-24 DIAGNOSIS — M25561 Pain in right knee: Secondary | ICD-10-CM | POA: Diagnosis not present

## 2023-12-24 DIAGNOSIS — E7800 Pure hypercholesterolemia, unspecified: Secondary | ICD-10-CM | POA: Diagnosis not present

## 2023-12-24 DIAGNOSIS — M25562 Pain in left knee: Secondary | ICD-10-CM | POA: Diagnosis not present

## 2023-12-24 DIAGNOSIS — J302 Other seasonal allergic rhinitis: Secondary | ICD-10-CM | POA: Diagnosis not present

## 2023-12-24 DIAGNOSIS — E1165 Type 2 diabetes mellitus with hyperglycemia: Secondary | ICD-10-CM | POA: Diagnosis not present

## 2023-12-25 ENCOUNTER — Ambulatory Visit

## 2023-12-25 VITALS — BP 118/70 | HR 77 | Temp 98.2°F | Ht 62.0 in | Wt 198.0 lb

## 2023-12-25 DIAGNOSIS — Z23 Encounter for immunization: Secondary | ICD-10-CM | POA: Diagnosis not present

## 2023-12-25 NOTE — Progress Notes (Signed)
 Patient presents today for a flu vaccine. Patient received her vaccine in the right deltoid. Patient tolerated injection well.

## 2023-12-30 ENCOUNTER — Ambulatory Visit: Payer: Self-pay | Admitting: Nurse Practitioner

## 2023-12-30 DIAGNOSIS — Z23 Encounter for immunization: Secondary | ICD-10-CM | POA: Insufficient documentation

## 2023-12-31 ENCOUNTER — Ambulatory Visit

## 2023-12-31 VITALS — BP 131/77 | HR 68 | Temp 98.5°F | Ht 64.0 in | Wt 198.4 lb

## 2023-12-31 DIAGNOSIS — J849 Interstitial pulmonary disease, unspecified: Secondary | ICD-10-CM

## 2023-12-31 DIAGNOSIS — B348 Other viral infections of unspecified site: Secondary | ICD-10-CM

## 2023-12-31 DIAGNOSIS — R053 Chronic cough: Secondary | ICD-10-CM | POA: Diagnosis not present

## 2023-12-31 DIAGNOSIS — Z87891 Personal history of nicotine dependence: Secondary | ICD-10-CM | POA: Diagnosis not present

## 2023-12-31 NOTE — Patient Instructions (Signed)
 It was nice meeting you in the pulmonary clinic.  You had an infection back in June with parainfluenza virus and that is why you are sick.  I reviewed your prior images and I found prior CAT scan of your belly.  On that CAT scan I can see parts of your lungs which shows some scarring.  For that reason we will get a scan of your chest as well as pulmonary function tests (breathing test)  I will see you back in the clinic in 3 months

## 2023-12-31 NOTE — Progress Notes (Signed)
 Subjective:   PATIENT ID: Joanna Sandoval GENDER: female DOB: [birth year], MRN: [id number]   HPI 78 year old female with a past medical history of hypertension, type 2 diabetes, PAD with recent hospitalization for acute hypoxic respiratory failure in the setting of parainfluenza virus infection.  X-ray at that time with clear bilateral lung fields.  Patient states that on discharge she was prescribed Breo Ellipta  which she has been taking every day.  She questions if the inhaler helps but she suspects that it is helpful she takes it every day.  She reports an ongoing cough with yellow sputum production on a daily basis.  She reports dyspnea on exertion while doing house chores or when walking outside.  She denies any fevers or chills or night sweats.  She denies any loss of appetite or weight loss.  She denies any other symptoms otherwise.  She states that she has a son with asthma.  She has another son who died at the age of 42 due to pneumonia.  She remotely smoked for about 10 years between the age of 82 and the age of 68 about 2 cigarettes a day.  She also reports that she worked at [employer] and suspects that she was exposed to inhalational toxins.  I reviewed a CT abdomen performed on November 21, 2022 which shows evidence of peribronchial thickening with increased interstitial markings concerning for pulmonary fibrosis.  There is no dedicated CT chest.  Past Medical History:  Diagnosis Date   Arthritis    Diabetes mellitus    Hypercholesteremia    Hypertension    Post-menopausal bleeding    Postmenopausal bleeding 08/30/2011   Normal EBX 09/2011.     Family History  Problem Relation Age of Onset   Hypertension Mother    Parkinsonism Father    Hypertension Father    Cancer Father        ?lung   Diabetes Sister      Social History   Socioeconomic History   Marital status: Divorced    Spouse name: Not on file   Number of children: Not on file   Years of education: Not on  file   Highest education level: Not on file  Occupational History   Occupation: retired Lawyer  Tobacco Use   Smoking status: Former    Types: Cigarettes   Smokeless tobacco: Never  Vaping Use   Vaping status: Never Used  Substance and Sexual Activity   Alcohol use: No   Drug use: No   Sexual activity: Not Currently    Birth control/protection: Post-menopausal  Other Topics Concern   Not on file  Social History Narrative   Not on file   Social Drivers of Health   Financial Resource Strain: Low Risk  (09/19/2023)   Overall Financial Resource Strain (CARDIA)    Difficulty of Paying Living Expenses: Not hard at all  Food Insecurity: No Food Insecurity (11/09/2023)   Hunger Vital Sign    Worried About Running Out of Food in the Last Year: Never true    Ran Out of Food in the Last Year: Never true  Transportation Needs: No Transportation Needs (11/09/2023)   PRAPARE - Administrator, Civil Service (Medical): No    Lack of Transportation (Non-Medical): No  Physical Activity: Inactive (09/19/2023)   Exercise Vital Sign    Days of Exercise per Week: 0 days    Minutes of Exercise per Session: 0 min  Stress: Stress Concern Present (09/19/2023)   [place]  [employer] Occup[employer] - Occupational Stress Questionnaire    Feeling of Stress : To some extent  Social Connections: Moderately Integrated (09/24/2023)   Social Connection and Isolation Panel    Frequency of Communication with Friends and Family: More than three times a week    Frequency of Social Gatherings with Friends and Family: More than three times a week    Attends Religious Services: More than 4 times per year    Active Member of [employer] or Organizations: Yes    Attends Engineer, structural: More than 4 times per year    Marital Status: Divorced  Recent Concern: Social Connections - Moderately Isolated (09/19/2023)   Social Connection and Isolation Panel    Frequency of Communication with Friends and  Family: More than three times a week    Frequency of Social Gatherings with Friends and Family: Once a week    Attends Religious Services: More than 4 times per year    Active Member of [employer] or Organizations: No    Attends Banker Meetings: Never    Marital Status: Divorced  Catering manager Violence: Not At Risk (11/09/2023)   Humiliation, Afraid, Rape, and Kick questionnaire    Fear of Current or Ex-Partner: No    Emotionally Abused: No    Physically Abused: No    Sexually Abused: No     Allergies  Allergen Reactions   Grass Pollen(K-O-R-T-Swt Vern) Other (See Comments)    Sneezing, watery eyes     Outpatient Medications Prior to Visit  Medication Sig Dispense Refill   Accu-Chek Softclix Lancets lancets USE AS DIRECTED 200 each 2   albuterol  (VENTOLIN  HFA) 108 (90 Base) MCG/ACT inhaler Inhale 2 puffs into the lungs every 6 (six) hours as needed. 18 g 1   atenolol  (TENORMIN ) 100 MG tablet Take 100 mg by mouth daily.     atorvastatin  (LIPITOR) 40 MG tablet Take 40 mg by mouth at bedtime.     benzonatate  (TESSALON ) 100 MG capsule Take 1 capsule (100 mg total) by mouth 2 (two) times daily as needed (Throat soothing). 20 capsule 0   Blood Glucose Monitoring Suppl (ACCU-CHEK GUIDE) w/Device KIT USE TO CHECK BLOOD SUGAR TWICE  DAILY AT 10AM AND 5 PM 1 kit 1   Blood Pressure Monitoring (ADULT BLOOD PRESSURE CUFF LG) KIT Take blood pressure daily and as needed 1 kit 0   D3-50 50000 units capsule Take 50,000 Units by mouth once a week.  0   dapagliflozin  propanediol (FARXIGA ) 10 MG TABS tablet Take 1 tablet (10 mg total) by mouth daily before breakfast. 100 tablet 2   dorzolamide -timolol  (COSOPT ) 2-0.5 % ophthalmic solution Place 1 drop into both eyes 2 (two) times daily.     empagliflozin  (JARDIANCE ) 10 MG TABS tablet Take 1 tablet (10 mg total) by mouth daily. 90 tablet 2   fluticasone  (FLONASE ) 50 MCG/ACT nasal spray Place 2 sprays into both nostrils daily. 16 g 2    fluticasone  furoate-vilanterol (BREO ELLIPTA ) 200-25 MCG/ACT AEPB Inhale 1 puff into the lungs daily. 60 each 11   glimepiride  (AMARYL ) 2 MG tablet TAKE 1 TABLET BY MOUTH IN THE  MORNING AND AT BEDTIME 200 tablet 2   glucose blood (ACCU-CHEK GUIDE) test strip USE AS DIRECTED 200 strip 2   guaiFENesin -dextromethorphan  (ROBITUSSIN DM) 100-10 MG/5ML syrup Take 10 mLs by mouth every 6 (six) hours as needed for cough. 118 mL 0   [name] KIT Use to test blood sugar  twice daily as directed. E11.65 1 kit 12   latanoprost  (XALATAN ) 0.005 % ophthalmic solution Place 1 drop into both eyes at bedtime.     losartan  (COZAAR ) 50 MG tablet Take 50 mg by mouth daily.     No facility-administered medications prior to visit.    ROS Reviewed all systems and reported negative except as above     Objective:   Vitals:   12/31/23 1401  BP: 131/77  Pulse: 68  Temp: 98.5 F (36.9 C)  TempSrc: Oral  SpO2: 94%  Weight: 198 lb 6.4 oz (90 kg)  Height: 5' 4 (1.626 m)    Physical Exam General: Elderly female not in acute distress Chest: Bilateral dry crackles Abdomen: Soft, nontender Heart: Regular rate and rhythm, normal S1, normal S2 Extremities: Warm, well-perfused Neuro: Grossly intact    CBC    Component Value Date/Time   WBC 12.8 (H) 12/18/2023 0929   WBC 16.4 (H) 09/28/2023 0612   RBC 4.85 12/18/2023 0929   RBC 4.55 09/28/2023 0612   HGB 13.8 12/18/2023 0929   HCT 43.7 12/18/2023 0929   PLT 263 12/18/2023 0929   MCV 90 12/18/2023 0929   MCH 28.5 12/18/2023 0929   MCH 28.1 09/28/2023 0612   MCHC 31.6 12/18/2023 0929   MCHC 30.9 09/28/2023 0612   RDW 17.6 (H) 12/18/2023 0929   LYMPHSABS 4.1 (H) 08/15/2023 1041   MONOABS 1.1 (H) 11/29/2020 0333   EOSABS 1.8 (H) 08/15/2023 1041   BASOSABS 0.2 08/15/2023 1041     Chest imaging: CT abdomen and pelvis August 2024.  I personally reviewed the CT scan and interpretation as above  PFT: No PFTs on  file      Assessment & Plan:   Assessment & Plan ILD (interstitial lung disease) (HCC) Patient with recent hospitalization for parainfluenza virus infection.  She was given a Breo Ellipta  inhaler on discharge.  I question this diagnosis of asthma that she has however patient reports improvement in symptoms with use of Breo Ellipta .  For now I discussed with her continuing Breo Ellipta  we will obtain PFTs as well as a high-resolution CT chest to further evaluate her pulmonary scarring seen on her CT abdomen.  I advised her that she can stop using Breo if she feels no benefit at all.    Parainfluenza infection Recent parainfluenza virus infection.  Patient asked if there is any measures that she can do to prevent recurrent infections.  Discussed avoiding sick contacts.  Discussed getting a flu shot and a COVID test which she already has. Chronic cough Chronic cough could be related to lung scarring.  The patient denies any significant GERD symptoms.  She reports rarely that she has postnasal drip.  She could also be having a postinfectious cough given the recent parainfluenza virus infection.  Will reassess symptoms based on PFTs and CT scan.  Follow-up in 3 months requested  Orders Placed This Encounter  Procedures   CT Chest High Resolution    Standing Status:   Future    Expiration Date:   12/30/2024    Preferred imaging location?:   [facility]   Pulmonary function test    Standing Status:   Future    Expected Date:   12/31/2023    Expiration Date:   12/30/2024    Where should this test be performed?:   Outpatient Pulmonary    What type of PFT is being ordered?:   Full PFT      Zola Herter, MD [place]  Pulmonary & Critical Care Office: [phone]

## 2024-01-04 ENCOUNTER — Ambulatory Visit
Admission: RE | Admit: 2024-01-04 | Discharge: 2024-01-04 | Disposition: A | Discharge status: Home / Self Care | Source: Ambulatory Visit

## 2024-01-04 ENCOUNTER — Other Ambulatory Visit: Payer: Self-pay

## 2024-01-04 DIAGNOSIS — H6123 Impacted cerumen, bilateral: Secondary | ICD-10-CM | POA: Diagnosis not present

## 2024-01-04 DIAGNOSIS — I700 Atherosclerosis of aorta: Secondary | ICD-10-CM | POA: Diagnosis not present

## 2024-01-04 DIAGNOSIS — E559 Vitamin D deficiency, unspecified: Secondary | ICD-10-CM | POA: Diagnosis not present

## 2024-01-04 DIAGNOSIS — M25561 Pain in right knee: Secondary | ICD-10-CM | POA: Diagnosis not present

## 2024-01-04 DIAGNOSIS — J984 Other disorders of lung: Secondary | ICD-10-CM | POA: Diagnosis not present

## 2024-01-04 DIAGNOSIS — J849 Interstitial pulmonary disease, unspecified: Secondary | ICD-10-CM | POA: Diagnosis not present

## 2024-01-04 DIAGNOSIS — Z7951 Long term (current) use of inhaled steroids: Secondary | ICD-10-CM | POA: Diagnosis not present

## 2024-01-04 DIAGNOSIS — M25562 Pain in left knee: Secondary | ICD-10-CM | POA: Diagnosis not present

## 2024-01-04 DIAGNOSIS — Z9981 Dependence on supplemental oxygen: Secondary | ICD-10-CM | POA: Diagnosis not present

## 2024-01-04 DIAGNOSIS — Z7984 Long term (current) use of oral hypoglycemic drugs: Secondary | ICD-10-CM | POA: Diagnosis not present

## 2024-01-04 DIAGNOSIS — I119 Hypertensive heart disease without heart failure: Secondary | ICD-10-CM | POA: Diagnosis not present

## 2024-01-04 DIAGNOSIS — Z5982 Transportation insecurity: Secondary | ICD-10-CM | POA: Diagnosis not present

## 2024-01-04 DIAGNOSIS — M109 Gout, unspecified: Secondary | ICD-10-CM | POA: Diagnosis not present

## 2024-01-04 DIAGNOSIS — H9192 Unspecified hearing loss, left ear: Secondary | ICD-10-CM | POA: Diagnosis not present

## 2024-01-04 DIAGNOSIS — E1151 Type 2 diabetes mellitus with diabetic peripheral angiopathy without gangrene: Secondary | ICD-10-CM | POA: Diagnosis not present

## 2024-01-04 DIAGNOSIS — E1165 Type 2 diabetes mellitus with hyperglycemia: Secondary | ICD-10-CM | POA: Diagnosis not present

## 2024-01-04 DIAGNOSIS — J302 Other seasonal allergic rhinitis: Secondary | ICD-10-CM | POA: Diagnosis not present

## 2024-01-04 DIAGNOSIS — G8929 Other chronic pain: Secondary | ICD-10-CM | POA: Diagnosis not present

## 2024-01-04 DIAGNOSIS — Z87891 Personal history of nicotine dependence: Secondary | ICD-10-CM | POA: Diagnosis not present

## 2024-01-04 DIAGNOSIS — H401131 Primary open-angle glaucoma, bilateral, mild stage: Secondary | ICD-10-CM | POA: Diagnosis not present

## 2024-01-04 DIAGNOSIS — E7800 Pure hypercholesterolemia, unspecified: Secondary | ICD-10-CM | POA: Diagnosis not present

## 2024-01-04 DIAGNOSIS — Z79899 Other long term (current) drug therapy: Secondary | ICD-10-CM | POA: Diagnosis not present

## 2024-01-04 DIAGNOSIS — M1990 Unspecified osteoarthritis, unspecified site: Secondary | ICD-10-CM | POA: Diagnosis not present

## 2024-01-04 NOTE — Patient Instructions (Signed)
 Visit Information  Thank you for taking time to visit with me today. Please don't hesitate to contact me if I can be of assistance to you before our next scheduled appointment.  Your next care management appointment is by telephone on Tuesday, October 14 at 09:30 AM  Please call the care guide team at [phone] if you need to cancel, schedule, or reschedule an appointment.   Please call [phone]-TALK (toll free, 24 hour hotline) if you are experiencing a Mental Health or Behavioral Health Crisis or need someone to talk to.  Clayborne Ly RN BSN CCM [place]  [facility], [facility] Nurse Care Coordinator  Direct Dial: [phone] Website: [email or web address]

## 2024-01-04 NOTE — Patient Outreach (Signed)
 Complex Care Management   Visit Note  01/04/2024  Name:  Joanna Sandoval MRN: [id number] DOB: May 18, 1945  Situation: Referral received for Complex Care Management related to  to DMII, OSA w/CPAP,  chronic cough, Interstitial Lung Disease. I obtained verbal consent from Patient.  Visit completed with Patient on the phone.  Background:   Past Medical History:  Diagnosis Date   Arthritis    Diabetes mellitus    Hypercholesteremia    Hypertension    Post-menopausal bleeding    Postmenopausal bleeding 08/30/2011   Normal EBX 09/2011.    Assessment: Patient Reported Symptoms:  Cognitive Cognitive Status: Alert and oriented to person, place, and time, Normal speech and language skills Cognitive/Intellectual Conditions Management [RPT]: None reported or documented in medical history or problem list   Health Maintenance Behaviors: Annual physical exam, Healthy diet, Immunizations, Sleep adequate Health Facilitated by: Healthy diet, Rest  Neurological Neurological Review of Symptoms: Other: Oher Neurological Symptoms/Conditions [RPT]: tremors to right hand Neurological Management Strategies: Routine screening, Adequate rest, Medication therapy Neurological Self-Management Outcome: 3 (uncertain)  HEENT HEENT Symptoms Reported: Change or loss of hearing HEENT Management Strategies: Medical device, Routine screening HEENT Self-Management Outcome: 4 (good)    Cardiovascular Cardiovascular Symptoms Reported: No symptoms reported Does patient have uncontrolled Hypertension?: No Cardiovascular Management Strategies: Adequate rest, Medication therapy, Routine screening, Medical device Cardiovascular Self-Management Outcome: 4 (good)  Respiratory Respiratory Symptoms Reported: Productive cough Respiratory Management Strategies: Medication therapy, Routine screening, CPAP Respiratory Self-Management Outcome: 3 (uncertain)  Endocrine Endocrine Symptoms Reported: No symptoms reported Is patient  diabetic?: Yes Is patient checking blood sugars at home?: Yes List most recent blood sugar readings, include date and time of day: not provided Endocrine Self-Management Outcome: 4 (good)  Gastrointestinal Gastrointestinal Symptoms Reported: No symptoms reported      Genitourinary Genitourinary Symptoms Reported: Incontinence Genitourinary Management Strategies: Incontinence garment/pad, Fluid modification Genitourinary Self-Management Outcome: 4 (good) Genitourinary Comment: Re-educated patient re: the importance of staying well hydrated with water, aiming for 48-64 oz daily  Integumentary Integumentary Symptoms Reported: No symptoms reported    Musculoskeletal Musculoskelatal Symptoms Reviewed: Difficulty walking, Limited mobility, Unsteady gait Additional Musculoskeletal Details: patient continues to use her Rollator for ambulation and is working with in home PT Musculoskeletal Management Strategies: Medical device, Routine screening, Exercise, Adequate rest Musculoskeletal Self-Management Outcome: 4 (good)      Psychosocial Psychosocial Symptoms Reported: No symptoms reported   Major Change/Loss/Stressor/Fears (CP): Medical condition, self Behaviors When Feeling Stressed/Fearful: due to having worsening tremors, patient does not like to go out in public Techniques to [place] with Loss/Stress/Change: Exercise, Spiritual practice(s) Quality of Family Relationships: helpful, involved, supportive Do you feel physically threatened by others?: No    01/04/2024    PHQ2-9 Depression Screening   [place] Stay interest or pleasure in doing things    Feeling down, depressed, or hopeless    PHQ-2 - Total Score    Trouble falling or staying asleep, or sleeping too much    Feeling tired or having Marializ Ferrebee energy    Poor appetite or overeating     Feeling bad about yourself - or that you are a failure or have let yourself or your family down    Trouble concentrating on things, such as reading the  newspaper or watching television    Moving or speaking so slowly that other people could have noticed.  Or the opposite - being so fidgety or restless that you have been moving around a lot more than usual    Thoughts that  you would be better off dead, or hurting yourself in some way    PHQ2-9 Total Score    If you checked off any problems, how difficult have these problems made it for you to do your work, take care of things at home, or get along with other people    Depression Interventions/Treatment      There were no vitals filed for this visit.  Medications Reviewed Today     Reviewed by [name], RN (Registered Nurse) on 01/04/24 at 1134  Med List Status: <None>   Medication Order Taking? Sig Documenting Provider Last Dose Status Informant  Accu-Chek Softclix Lancets lancets [id number]  USE AS DIRECTED Georgina Speaks, FNP  Active Self, Pharmacy Records  albuterol  (VENTOLIN  HFA) [phone] Base) MCG/ACT inhaler [id number]  Inhale 2 puffs into the lungs every 6 (six) hours as needed. Georgina Speaks, FNP  Active   atenolol  (TENORMIN ) 100 MG tablet [id number]  Take 100 mg by mouth daily. [provider]  Active Self, Pharmacy Records  atorvastatin  (LIPITOR) 40 MG tablet [id number]  Take 40 mg by mouth at bedtime. [provider]  Active Self, Pharmacy Records  benzonatate  (TESSALON ) 100 MG capsule [id number]  Take 1 capsule (100 mg total) by mouth 2 (two) times daily as needed (Throat soothing). Von Bellis, MD  Active   Blood Glucose Monitoring Suppl (ACCU-CHEK GUIDE) w/Device KIT [id number]  USE TO CHECK BLOOD SUGAR TWICE  DAILY AT 10AM AND 5 PM Georgina Speaks, FNP  Active Self, Pharmacy Records  Blood Pressure Monitoring (ADULT BLOOD PRESSURE CUFF LG) KIT [id number]  Take blood pressure daily and as needed Georgina Speaks, FNP  Active Self, Pharmacy Records  D3-50 50000 units capsule [id number]  Take 50,000 Units by mouth once a week. [provider]  Active Self,  Pharmacy Records  dapagliflozin  propanediol (FARXIGA ) 10 MG TABS tablet [id number]  Take 1 tablet (10 mg total) by mouth daily before breakfast. Georgina Speaks, FNP  Active   dorzolamide -timolol  (COSOPT ) 2-0.5 % ophthalmic solution [id number]  Place 1 drop into both eyes 2 (two) times daily. [provider]  Active Self, Pharmacy Records  empagliflozin  (JARDIANCE ) 10 MG TABS tablet [id number]  Take 1 tablet (10 mg total) by mouth daily. Georgina Speaks, FNP  Active   fluticasone  (FLONASE ) 50 MCG/ACT nasal spray [id number]  Place 2 sprays into both nostrils daily. Georgina Speaks, FNP  Active Self, Pharmacy Records  fluticasone  furoate-vilanterol (BREO ELLIPTA ) 200-25 MCG/ACT AEPB [id number]  Inhale 1 puff into the lungs daily. Von Bellis, MD  Active   glimepiride  (AMARYL ) 2 MG tablet [id number]  TAKE 1 TABLET BY MOUTH IN THE  MORNING AND AT BEDTIME Georgina Speaks, FNP  Active Self, Pharmacy Records  glucose blood (ACCU-CHEK GUIDE) test strip [id number]  USE AS DIRECTED Georgina Speaks, FNP  Active Self, Pharmacy Records  guaiFENesin -dextromethorphan  (ROBITUSSIN DM) 100-10 MG/5ML syrup [id number]  Take 10 mLs by mouth every 6 (six) hours as needed for cough. Von Bellis, MD  Active   Lancets Misc. [facility] FASTCLIX LANCET) KIT [id number]  Use to test blood sugar twice daily as directed. E11.65 Georgina Speaks, FNP  Active   latanoprost  (XALATAN ) 0.005 % ophthalmic solution [id number]  Place 1 drop into both eyes at bedtime. [provider]  Active Self, Pharmacy Records  losartan  (COZAAR ) 50 MG tablet [id number]  Take 50 mg by mouth daily. [provider]  Active Self, Pharmacy Records            Recommendation:   Specialty  provider follow-up on 03/03/24 at 8:45 AM with Whitfield Raisin, NPDepartment:[facility] Continue Current Plan of Care  Follow Up Plan:   Telephone follow up appointment date/time:  Tuesday, October 14 at 09:30 AM  Clayborne Ly RN BSN CCM [place]   [facility], [facility] Health Nurse Care Coordinator  Direct Dial: [phone] Website: [email or web address]

## 2024-01-09 DIAGNOSIS — J302 Other seasonal allergic rhinitis: Secondary | ICD-10-CM | POA: Diagnosis not present

## 2024-01-09 DIAGNOSIS — E1151 Type 2 diabetes mellitus with diabetic peripheral angiopathy without gangrene: Secondary | ICD-10-CM | POA: Diagnosis not present

## 2024-01-09 DIAGNOSIS — Z87891 Personal history of nicotine dependence: Secondary | ICD-10-CM | POA: Diagnosis not present

## 2024-01-09 DIAGNOSIS — I119 Hypertensive heart disease without heart failure: Secondary | ICD-10-CM | POA: Diagnosis not present

## 2024-01-09 DIAGNOSIS — Z7951 Long term (current) use of inhaled steroids: Secondary | ICD-10-CM | POA: Diagnosis not present

## 2024-01-09 DIAGNOSIS — M1990 Unspecified osteoarthritis, unspecified site: Secondary | ICD-10-CM | POA: Diagnosis not present

## 2024-01-09 DIAGNOSIS — M25561 Pain in right knee: Secondary | ICD-10-CM | POA: Diagnosis not present

## 2024-01-09 DIAGNOSIS — I700 Atherosclerosis of aorta: Secondary | ICD-10-CM | POA: Diagnosis not present

## 2024-01-09 DIAGNOSIS — H9192 Unspecified hearing loss, left ear: Secondary | ICD-10-CM | POA: Diagnosis not present

## 2024-01-09 DIAGNOSIS — G8929 Other chronic pain: Secondary | ICD-10-CM | POA: Diagnosis not present

## 2024-01-09 DIAGNOSIS — M109 Gout, unspecified: Secondary | ICD-10-CM | POA: Diagnosis not present

## 2024-01-09 DIAGNOSIS — E7800 Pure hypercholesterolemia, unspecified: Secondary | ICD-10-CM | POA: Diagnosis not present

## 2024-01-09 DIAGNOSIS — H6123 Impacted cerumen, bilateral: Secondary | ICD-10-CM | POA: Diagnosis not present

## 2024-01-09 DIAGNOSIS — Z7984 Long term (current) use of oral hypoglycemic drugs: Secondary | ICD-10-CM | POA: Diagnosis not present

## 2024-01-09 DIAGNOSIS — Z79899 Other long term (current) drug therapy: Secondary | ICD-10-CM | POA: Diagnosis not present

## 2024-01-09 DIAGNOSIS — Z9981 Dependence on supplemental oxygen: Secondary | ICD-10-CM | POA: Diagnosis not present

## 2024-01-09 DIAGNOSIS — E559 Vitamin D deficiency, unspecified: Secondary | ICD-10-CM | POA: Diagnosis not present

## 2024-01-09 DIAGNOSIS — E1165 Type 2 diabetes mellitus with hyperglycemia: Secondary | ICD-10-CM | POA: Diagnosis not present

## 2024-01-09 DIAGNOSIS — Z5982 Transportation insecurity: Secondary | ICD-10-CM | POA: Diagnosis not present

## 2024-01-09 DIAGNOSIS — H401131 Primary open-angle glaucoma, bilateral, mild stage: Secondary | ICD-10-CM | POA: Diagnosis not present

## 2024-01-09 DIAGNOSIS — M25562 Pain in left knee: Secondary | ICD-10-CM | POA: Diagnosis not present

## 2024-01-11 ENCOUNTER — Other Ambulatory Visit: Payer: Self-pay | Admitting: Nurse Practitioner

## 2024-01-11 DIAGNOSIS — E1151 Type 2 diabetes mellitus with diabetic peripheral angiopathy without gangrene: Secondary | ICD-10-CM

## 2024-01-16 ENCOUNTER — Other Ambulatory Visit: Payer: Self-pay

## 2024-01-16 DIAGNOSIS — E1151 Type 2 diabetes mellitus with diabetic peripheral angiopathy without gangrene: Secondary | ICD-10-CM

## 2024-01-16 MED ORDER — EMPAGLIFLOZIN 10 MG PO TABS: 10.0000 mg | ORAL_TABLET | Freq: Every day | ORAL | 2 refills | Status: DC

## 2024-01-22 DIAGNOSIS — G4733 Obstructive sleep apnea (adult) (pediatric): Secondary | ICD-10-CM | POA: Diagnosis not present

## 2024-01-28 DIAGNOSIS — G4733 Obstructive sleep apnea (adult) (pediatric): Secondary | ICD-10-CM | POA: Diagnosis not present

## 2024-01-29 ENCOUNTER — Telehealth (HOSPITAL_COMMUNITY): Payer: Self-pay | Admitting: *Deleted

## 2024-01-29 ENCOUNTER — Other Ambulatory Visit: Payer: Self-pay

## 2024-01-29 ENCOUNTER — Telehealth: Payer: Self-pay

## 2024-01-29 DIAGNOSIS — R053 Chronic cough: Secondary | ICD-10-CM

## 2024-01-29 MED ORDER — GUAIFENESIN ER 600 MG PO TB12: 600.0000 mg | ORAL_TABLET | Freq: Two times a day (BID) | ORAL | 2 refills | Status: AC

## 2024-01-29 NOTE — Patient Outreach (Signed)
 Complex Care Management   Visit Note  01/29/2024  Name:  Joanna Sandoval MRN: [id number] DOB: 1945/08/26  Situation: Referral received for Complex Care Management related to DMII, OSA w/CPAP,  chronic cough, Interstitial Lung Disease. I obtained verbal consent from Patient.  Visit completed with Patient on the phone.  Background:   Past Medical History:  Diagnosis Date   Arthritis    Diabetes mellitus    Hypercholesteremia    Hypertension    Post-menopausal bleeding    Postmenopausal bleeding 08/30/2011   Normal EBX 09/2011.    Assessment: Patient Reported Symptoms:  Cognitive Cognitive Status: Alert and oriented to person, place, and time, Normal speech and language skills Cognitive/Intellectual Conditions Management [RPT]: None reported or documented in medical history or problem list   Health Maintenance Behaviors: Annual physical exam, Healthy diet, Immunizations Health Facilitated by: Rest, Healthy diet  Neurological Neurological Review of Symptoms: Other: Oher Neurological Symptoms/Conditions [RPT]: tremors to right hand Neurological Management Strategies: Adequate rest, Routine screening Neurological Self-Management Outcome: 3 (uncertain) Neurological Comment: reviewed and discussed upcoming scheduled Neuro visit, patient plans to purchase weighted utensils  HEENT HEENT Symptoms Reported: No symptoms reported      Cardiovascular Cardiovascular Symptoms Reported: No symptoms reported Does patient have uncontrolled Hypertension?: No Cardiovascular Management Strategies: Medication therapy, Routine screening Cardiovascular Self-Management Outcome: 4 (good)  Respiratory Respiratory Symptoms Reported: Productive cough Other Respiratory Symptoms: persistent coughing Additional Respiratory Details: reviewed next scheduled Pulmonology appointment; sent in basket message to Dr. Zaida requesting someone outreach to patient to review her recent lung CT Respiratory Management  Strategies: Routine screening, Medication therapy, Adequate rest, Breathing exercise Respiratory Self-Management Outcome: 3 (uncertain)  Endocrine Endocrine Symptoms Reported: No symptoms reported Is patient diabetic?: Yes Is patient checking blood sugars at home?: Yes List most recent blood sugar readings, include date and time of day: no FBS checked today Endocrine Self-Management Outcome: 4 (good)  Gastrointestinal Gastrointestinal Symptoms Reported: No symptoms reported      Genitourinary Genitourinary Symptoms Reported: Frequency, Incontinence Genitourinary Management Strategies: Incontinence garment/pad, Fluid modification Genitourinary Self-Management Outcome: 4 (good)  Integumentary Integumentary Symptoms Reported: Sweating Additional Integumentary Details: intermittent night sweats Skin Management Strategies: Routine screening Skin Self-Management Outcome: 4 (good)  Musculoskeletal Musculoskelatal Symptoms Reviewed: Limited mobility Additional Musculoskeletal Details: patient is using a cane for ambulation Musculoskeletal Management Strategies: Adequate rest, Routine screening, Medical device, Exercise Musculoskeletal Self-Management Outcome: 4 (good) Falls in the past year?: No Number of falls in past year: 1 or less Was there an injury with Fall?: No Fall Risk Category Calculator: 0 Patient Fall Risk Level: Low Fall Risk Patient at Risk for Falls Due to: Impaired mobility Fall risk Follow up: Education provided, Falls evaluation completed, Falls prevention discussed  Psychosocial Psychosocial Symptoms Reported: Report of significant loss, deaths, abandonment, traumatic incidents Additional Psychological Details: patient lost her son years back he was 27, the upcoming holidays are worsening her grief, she will start grief counseling at her church in the near future Behavioral Management Strategies: Counseling, Complementary therapy(ies), Support system Behavioral Health  Self-Management Outcome: 4 (good) Major Change/Loss/Stressor/Fears (CP): Death of a loved one Techniques to Cope with Loss/Stress/Change: Counseling, Diversional activities, Support group Quality of Family Relationships: helpful, involved, supportive Do you feel physically threatened by others?: No    01/29/2024    PHQ2-9 Depression Screening   Joanna Sandoval Vo interest or pleasure in doing things    Feeling down, depressed, or hopeless    PHQ-2 - Total Score    Trouble falling or staying asleep, or  sleeping too much    Feeling tired or having Joanna Sandoval energy    Poor appetite or overeating     Feeling bad about yourself - or that you are a failure or have let yourself or your family down    Trouble concentrating on things, such as reading the newspaper or watching television    Moving or speaking so slowly that other people could have noticed.  Or the opposite - being so fidgety or restless that you have been moving around a lot more than usual    Thoughts that you would be better off dead, or hurting yourself in some way    PHQ2-9 Total Score    If you checked off any problems, how difficult have these problems made it for you to do your work, take care of things at home, or get along with other people    Depression Interventions/Treatment      There were no vitals filed for this visit.  Medications Reviewed Today     Reviewed by Joanna Clayborne CROME, RN (Registered Nurse) on 01/29/24 at [phone]  Med List Status: <None>   Medication Order Taking? Sig Documenting Provider Last Dose Status Informant  Accu-Chek Softclix Lancets lancets [id number]  USE AS DIRECTED Georgina Speaks, FNP  Active Self, Pharmacy Records  albuterol  (VENTOLIN  HFA) 108 [phone] Base) MCG/ACT inhaler [id number]  Inhale 2 puffs into the lungs every 6 (six) hours as needed. Georgina Speaks, FNP  Active   atenolol  (TENORMIN ) 100 MG tablet [id number]  Take 100 mg by mouth daily. [provider]  Active Self, Pharmacy Records  atorvastatin   (LIPITOR) 40 MG tablet [id number]  Take 40 mg by mouth at bedtime. [provider]  Active Self, Pharmacy Records  benzonatate  (TESSALON ) 100 MG capsule [id number]  Take 1 capsule (100 mg total) by mouth 2 (two) times daily as needed (Throat soothing). Von Bellis, MD  Active   Blood Glucose Monitoring Suppl (ACCU-CHEK GUIDE) w/Device KIT [id number]  USE TO CHECK BLOOD SUGAR TWICE  DAILY AT 10AM AND 5 PM Georgina Speaks, FNP  Active Self, Pharmacy Records  Blood Pressure Monitoring (ADULT BLOOD PRESSURE CUFF LG) KIT [id number]  Take blood pressure daily and as needed Georgina Speaks, FNP  Active Self, Pharmacy Records  D3-50 50000 units capsule [id number]  Take 50,000 Units by mouth once a week. [provider]  Active Self, Pharmacy Records  dapagliflozin  propanediol (FARXIGA ) 10 MG TABS tablet [id number]  Take 1 tablet (10 mg total) by mouth daily before breakfast. Georgina Speaks, FNP  Active   dorzolamide -timolol  (COSOPT ) 2-0.5 % ophthalmic solution [id number]  Place 1 drop into both eyes 2 (two) times daily. [provider]  Active Self, Pharmacy Records  empagliflozin  (JARDIANCE ) 10 MG TABS tablet [id number]  Take 1 tablet (10 mg total) by mouth daily. Georgina Speaks, FNP  Active   fluticasone  (FLONASE ) 50 MCG/ACT nasal spray [id number]  Place 2 sprays into both nostrils daily. Georgina Speaks, FNP  Active Self, Pharmacy Records  fluticasone  furoate-vilanterol (BREO ELLIPTA ) 200-25 MCG/ACT AEPB [id number]  Inhale 1 puff into the lungs daily. Von Bellis, MD  Active   glimepiride  (AMARYL ) 2 MG tablet [id number]  TAKE 1 TABLET BY MOUTH IN THE  MORNING AND AT BEDTIME Georgina Speaks, FNP  Active Self, Pharmacy Records  glucose blood (ACCU-CHEK GUIDE) test strip [id number]  USE AS DIRECTED Georgina Speaks, FNP  Active Self, Pharmacy Records  guaiFENesin -dextromethorphan  (ROBITUSSIN DM) 100-10 MG/5ML syrup [id number]  Take 10 mLs by mouth every 6 (six) hours  as needed for cough. Von Bellis, MD   Active   Lancets Misc. [facility] FASTCLIX LANCET) KIT [id number]  Use to test blood sugar twice daily as directed. E11.65 Georgina Speaks, FNP  Active   latanoprost  (XALATAN ) 0.005 % ophthalmic solution [id number]  Place 1 drop into both eyes at bedtime. [provider]  Active Self, Pharmacy Records  losartan  (COZAAR ) 50 MG tablet [id number]  Take 50 mg by mouth daily. [provider]  Active Self, Pharmacy Records            Recommendation:   Specialty provider follow-up on 03/03/24 at 08:45 AM with   Whitfield Raisin, NP Department: [facility]   Specialty provider follow-up on 03/24/24 at 09:00 AM with   Hattar, Zola SAILOR, MD Department: [facility] CARE  Continue Current Plan of Care  Follow Up Plan:   Telephone follow up appointment date/time:  Tuesday, November 18 at 09:30 AM  Clayborne Ly RN BSN CCM [place]  [facility], [facility] Health Nurse Care Coordinator  Direct Dial: [phone] Website: [email or web address]

## 2024-01-29 NOTE — Telephone Encounter (Signed)
 Attempted to contact patient to schedule OP MBS. Left VM @ [phone] requesting a call back. RKEEL

## 2024-01-29 NOTE — Telephone Encounter (Signed)
 Please schedule pft

## 2024-01-29 NOTE — Telephone Encounter (Signed)
 Patient with bronchial wall thickening, mild bronchiectasis and mucous at the dependant zones of her lungs on CT. No evidence of pulmonary fibrosis that I see. I called the patient and communicated it this to her, she has choking spells 1-2 times a week which could be causing these findings. I will refer her to SLP for an MBS and swallowing techniques. I will send her a prescirption for a flutter valve. I asked her to start taking mucinex  and call the clinic to schedule the PFTs.   Zola Herter, MD [facility] Office: [phone]   See Amion for personal pager PCCM on call pager [phone] until 7pm. Please call Elink 7p-7a. [phone]

## 2024-01-29 NOTE — Patient Instructions (Signed)
 Visit Information  Thank you for taking time to visit with me today. Please don't hesitate to contact me if I can be of assistance to you before our next scheduled appointment.  Your next care management appointment is by telephone on Tuesday, November 18 at 09:30 AM  Please call the care guide team at [phone] if you need to cancel, schedule, or reschedule an appointment.   Please call 1-800-273-TALK (toll free, 24 hour hotline) if you are experiencing a Mental Health or Behavioral Health Crisis or need someone to talk to.  Clayborne Ly RN BSN CCM [place]  [facility], [facility] Health Nurse Care Coordinator  Direct Dial: [phone] Website: [email or web address]

## 2024-02-04 DIAGNOSIS — E782 Mixed hyperlipidemia: Secondary | ICD-10-CM | POA: Diagnosis not present

## 2024-02-04 DIAGNOSIS — E119 Type 2 diabetes mellitus without complications: Secondary | ICD-10-CM | POA: Diagnosis not present

## 2024-02-04 DIAGNOSIS — G4733 Obstructive sleep apnea (adult) (pediatric): Secondary | ICD-10-CM | POA: Diagnosis not present

## 2024-02-04 DIAGNOSIS — I10 Essential (primary) hypertension: Secondary | ICD-10-CM | POA: Diagnosis not present

## 2024-02-05 ENCOUNTER — Telehealth (HOSPITAL_COMMUNITY): Payer: Self-pay

## 2024-02-05 NOTE — Telephone Encounter (Signed)
 Second attempt to contact patient to schedule OP MBS. Left VM @ [phone] requesting a call back. (AHARRIS)

## 2024-02-08 DIAGNOSIS — E119 Type 2 diabetes mellitus without complications: Secondary | ICD-10-CM | POA: Diagnosis not present

## 2024-02-12 ENCOUNTER — Telehealth: Payer: Self-pay

## 2024-02-12 ENCOUNTER — Other Ambulatory Visit (HOSPITAL_COMMUNITY): Payer: Self-pay

## 2024-02-12 DIAGNOSIS — R1310 Dysphagia, unspecified: Secondary | ICD-10-CM

## 2024-02-12 DIAGNOSIS — R059 Cough, unspecified: Secondary | ICD-10-CM

## 2024-02-12 NOTE — Progress Notes (Signed)
   Telephone encounter was:  Unsuccessful.  02/12/2024 Name: Joanna Sandoval MRN: [id number] DOB: Mar 02, 1946  Unsuccessful outbound call made today to assist with:  [employer] Attempt:  3rd Attempt.  Referral closed unable to contact patient.  A HIPAA compliant voice message was left requesting a return call.  Instructed patient to call back    Jon Colt [facility] Guide, Phone: [phone] Fax: [phone] Website: [email or web address]

## 2024-02-13 NOTE — Telephone Encounter (Signed)
 PFT scheduled for 03/21/24 @1 :00pm at [facility]

## 2024-02-27 NOTE — Progress Notes (Signed)
 [facility] [address] [place]. [place] [address] [phone]       OFFICE FOLLOW UP NOTE  Ms. DAVE MANNES Date of Birth:  January 11, 1946 Medical Record Number:  [id number]    Primary neurologist: Dr. Buck Reason for visit: Initial CPAP follow-up    SUBJECTIVE:   CHIEF COMPLAINT:  Chief Complaint  Patient presents with   Follow-up    Pt in 3 alone Pt here for cpap/tremor f/u Pt states mask not fitting correctly Pt states nose irritated and burning  when wearing mask.Pt states tremors are worse Pt states 1 fall in last month Pt states neck pain     Follow-up visit:  Prior visit: 08/07/2023  Brief HPI:   SUHAYLAH WAMPOLE is a 78 y.o. female who was evaluated by Dr. Buck in 02/2023 for 2-year history of hand tremors most noticeable with action and felt most consistent with essential tremor.  Noted sister with hx of tremor and father with history of Parkinson's disease but no concerning findings consistent with parkinsonism on exam.  Also noted poor sleep with sleep disruption, nocturia, snoring, excessive daytime sleepiness and occasional morning headaches.  ESS 12/24.  No medications for tremors recommended as she is already on beta-blocker and concern other medication such as primidone would increase risk of falls.  Recommended proceeding with MRI brain and sleep study.  HST 04/2023 showed severe sleep apnea with total AHI of 71.1/h and O2 nadir of 68% with significant time below or at 88% saturation of nearly 3 hours for the study, indicating nocturnal hypoxemia.  Recommend initiation of AutoPap on 06/2023.   MRI brain 04/2023 mild chronic microvascular ischemic changes otherwise unremarkable    Interval history:  Patient returns for follow-up visit.  She notes continued difficulty tolerating CPAP.  She did receive a nasal pillow mask due to prior difficulty tolerating fullface mask but feels this caused irritation and burning sensation in her nose  after using for only short duration.  Over the past 90 days, she has only used for 8 days with an average of 20 minutes, does show high leak rate during that time which she is aware of and bothered by. She only drives short distance and requires a ride to go to DME company so she has difficulty going back to see them if needed.  She is wanting to restart CPAP therapy as she knows importance of apnea treatment.  She also feels tremors have worsened.  Typically worse in the morning when trying to prepare breakfast.  She has had more difficulty with eating and writing due to her tremor.  She has noticed tremor can worsen after prolonged activity/exertion and anxiety.  She recently completed home health PT after hospitalization for 4 days back in June for sepsis due to parainfluenza virus infection and acute hypoxic respiratory failure.  She is now using a cane to ambulate, at times will use rollator walker.  Does report recent fall but thankfully no significant injury or hitting of head.  She has had some mild left to midline neck discomfort, has been using ice and heat with benefit and gradually improving.        ROS:   14 system review of systems performed and negative with exception of those listed in HPI  PMH:  Past Medical History:  Diagnosis Date   Arthritis    Diabetes mellitus    Hypercholesteremia    Hypertension    Post-menopausal bleeding    Postmenopausal bleeding 08/30/2011   Normal  EBX 09/2011.    PSH:  Past Surgical History:  Procedure Laterality Date   CATARACT EXTRACTION     COLONOSCOPY     DILATION AND CURETTAGE OF UTERUS     HYSTEROSCOPY WITH D & C N/A 05/02/2017   Procedure: DILATATION AND CURETTAGE /HYSTEROSCOPY;  Surgeon: Alger Gong, MD;  Location: [facility];  Service: Gynecology;  Laterality: N/A;   KNEE ARTHROSCOPY      Social History:  Social History   Socioeconomic History   Marital status: Divorced    Spouse name: Not on file    Number of children: Not on file   Years of education: Not on file   Highest education level: Not on file  Occupational History   Occupation: retired LAWYER  Tobacco Use   Smoking status: Former    Types: Cigarettes   Smokeless tobacco: Never  Vaping Use   Vaping status: Never Used  Substance and Sexual Activity   Alcohol use: No   Drug use: No   Sexual activity: Not Currently    Birth control/protection: Post-menopausal  Other Topics Concern   Not on file  Social History Narrative   Retired    Pt lives with family    Social Drivers of Health   Financial Resource Strain: Low Risk  (09/19/2023)   Overall Financial Resource Strain (CARDIA)    Difficulty of Paying Living Expenses: Not hard at all  Food Insecurity: No Food Insecurity (01/29/2024)   Hunger Vital Sign    Worried About Running Out of Food in the Last Year: Never true    Ran Out of Food in the Last Year: Never true  Transportation Needs: No Transportation Needs (01/29/2024)   PRAPARE - Administrator, Civil Service (Medical): No    Lack of Transportation (Non-Medical): No  Physical Activity: Inactive (09/19/2023)   Exercise Vital Sign    Days of Exercise per Week: 0 days    Minutes of Exercise per Session: 0 min  Stress: Stress Concern Present (09/19/2023)   [employer] of Occupational Health - Occupational Stress Questionnaire    Feeling of Stress : To some extent  Social Connections: Moderately Integrated (09/24/2023)   Social Connection and Isolation Panel    Frequency of Communication with Friends and Family: More than three times a week    Frequency of Social Gatherings with Friends and Family: More than three times a week    Attends Religious Services: More than 4 times per year    Active Member of [employer] or Organizations: Yes    Attends Engineer, Structural: More than 4 times per year    Marital Status: Divorced  Recent Concern: Social Connections - Moderately Isolated (09/19/2023)    Social Connection and Isolation Panel    Frequency of Communication with Friends and Family: More than three times a week    Frequency of Social Gatherings with Friends and Family: Once a week    Attends Religious Services: More than 4 times per year    Active Member of [employer] or Organizations: No    Attends Banker Meetings: Never    Marital Status: Divorced  Catering Manager Violence: Not At Risk (01/29/2024)   Humiliation, Afraid, Rape, and Kick questionnaire    Fear of Current or Ex-Partner: No    Emotionally Abused: No    Physically Abused: No    Sexually Abused: No    Family History:  Family History  Problem Relation Age of Onset   Hypertension  Mother    Parkinsonism Father    Hypertension Father    Cancer Father        ?lung   Diabetes Sister     Medications:   Current Outpatient Medications on File Prior to Visit  Medication Sig Dispense Refill   Accu-Chek Softclix Lancets lancets USE AS DIRECTED 200 each 2   albuterol  (VENTOLIN  HFA) 108 (90 Base) MCG/ACT inhaler Inhale 2 puffs into the lungs every 6 (six) hours as needed. 18 g 1   atenolol  (TENORMIN ) 100 MG tablet Take 100 mg by mouth daily.     atorvastatin  (LIPITOR) 40 MG tablet Take 40 mg by mouth at bedtime.     benzonatate  (TESSALON ) 100 MG capsule Take 1 capsule (100 mg total) by mouth 2 (two) times daily as needed (Throat soothing). 20 capsule 0   Blood Glucose Monitoring Suppl (ACCU-CHEK GUIDE) w/Device KIT USE TO CHECK BLOOD SUGAR TWICE  DAILY AT 10AM AND 5 PM 1 kit 1   Blood Pressure Monitoring (ADULT BLOOD PRESSURE CUFF LG) KIT Take blood pressure daily and as needed 1 kit 0   D3-50 50000 units capsule Take 50,000 Units by mouth once a week.  0   dapagliflozin  propanediol (FARXIGA ) 10 MG TABS tablet Take 1 tablet (10 mg total) by mouth daily before breakfast. 100 tablet 2   dorzolamide -timolol  (COSOPT ) 2-0.5 % ophthalmic solution Place 1 drop into both eyes 2 (two) times daily.     empagliflozin   (JARDIANCE ) 10 MG TABS tablet Take 1 tablet (10 mg total) by mouth daily. 30 tablet 2   fluticasone  (FLONASE ) 50 MCG/ACT nasal spray Place 2 sprays into both nostrils daily. 16 g 2   fluticasone  furoate-vilanterol (BREO ELLIPTA ) 200-25 MCG/ACT AEPB Inhale 1 puff into the lungs daily. 60 each 11   glimepiride  (AMARYL ) 2 MG tablet TAKE 1 TABLET BY MOUTH IN THE  MORNING AND AT BEDTIME 200 tablet 2   glucose blood (ACCU-CHEK GUIDE) test strip USE AS DIRECTED 200 strip 2   guaiFENesin  (MUCINEX ) 600 MG 12 hr tablet Take 1 tablet (600 mg total) by mouth 2 (two) times daily. 60 tablet 2   guaiFENesin -dextromethorphan  (ROBITUSSIN DM) 100-10 MG/5ML syrup Take 10 mLs by mouth every 6 (six) hours as needed for cough. 118 mL 0   [name] KIT Use to test blood sugar twice daily as directed. E11.65 1 kit 12   latanoprost  (XALATAN ) 0.005 % ophthalmic solution Place 1 drop into both eyes at bedtime.     losartan  (COZAAR ) 50 MG tablet Take 50 mg by mouth daily.     No current facility-administered medications on file prior to visit.    Allergies:   Allergies  Allergen Reactions   Grass Pollen(K-O-R-T-Swt Vern) Other (See Comments)    Sneezing, watery eyes      OBJECTIVE:  Physical Exam  Vitals:   03/03/24 0844  BP: 121/67  Pulse: 68  Weight: 196 lb (88.9 kg)  Height: 5' 1 (1.549 m)   Body mass index is 37.03 kg/m. No results found.   General: well developed, well nourished, very pleasant elderly African-American female, seated, mildly anxious throughout visit   Neurologic Exam Mental Status: Awake and fully alert.  Fluent speech and language.  No hypophonia.  Oriented to place and time. Recent and remote memory intact. Attention span, concentration and fund of knowledge appropriate. Mood and affect appropriate although mildly anxious.  Cranial Nerves: Pupils equal, briskly reactive to light. Extraocular movements full without nystagmus. Visual fields  full to  confrontation. Hearing intact. Facial sensation intact. Face, tongue, palate moves normally and symmetrically.  Motor: Normal bulk and tone. Normal strength in all tested extremity muscles. Slight R>L postural tremor, no intention tremor or obvious resting tremor.  No rigidity or bradykinesia. Coronation: slow finger to nose bilaterally but no evidence of ataxia or dysmetria Gait and Station: Arises from chair without difficulty. Stance is normal. Gait demonstrates wide-based gait with slow cautious steps and use of cane        ASSESSMENT/PLAN: SHAKIYLA KOOK is a 78 y.o. year old female    OSA on CPAP :  Noncompliant with CPAP therapy due to difficulty tolerating Order placed to [facility] to discuss change of interface, currently using nasal pillow, prior difficulty tolerating FFM, consider trying F30i mask or nasal cradle.  Unsure if they offer in-home services but will request this in order today due to transportation difficulties. Discussed ways at home to help with compliance. Provided number for sleep coach through [employer].  Continue current pressure settings at this time. Elevated residual AHI but suspect due to high leak rate and noncompliance. Can adjust in the future if needed.  Discussed importance of nightly usage and ensuring greater than 4 hours nightly for optimal benefit and per insurance purposes.   Continue to follow with DME company [facility] for any needed supplies or CPAP related concerns AutoPap set up 06/2023  Essential tremor:  Subjective progression, unable to appreciate significant worsening on exam Question worsening due to hospitalization back in June for sepsis with generalized deconditioning. She is also quite anxious today, question if this is contributing.  Discussed conservative measures at home such as wrist weights, weighted utensils and weighted pens. She declines interest in pharmacological management. She is currently on  atenolol  100mg  daily.  No evidence of parkinsonism features on exam      Follow up in 3-4 months with Dr. Buck for further recommendations or call earlier if needed    CC:  PCP: Georgina Speaks, FNP    I personally spent a total of 40 minutes in the care of the patient today including preparing to see the patient, performing a medically appropriate exam/evaluation, counseling and educating, placing orders, and documenting clinical information in the EHR.   Harlene Bogaert, AGNP-BC  [facility] [address] [place], [place] [address]  Phone [phone] Fax [phone] Note: This document was prepared with digital dictation and possible smart phrase technology. Any transcriptional errors that result from this process are unintentional.

## 2024-03-03 ENCOUNTER — Ambulatory Visit: Admitting: Adult Health

## 2024-03-03 ENCOUNTER — Encounter: Payer: Self-pay | Admitting: Adult Health

## 2024-03-03 VITALS — BP 121/67 | HR 68 | Ht 61.0 in | Wt 196.0 lb

## 2024-03-03 DIAGNOSIS — Z820 Family history of epilepsy and other diseases of the nervous system: Secondary | ICD-10-CM

## 2024-03-03 DIAGNOSIS — G250 Essential tremor: Secondary | ICD-10-CM | POA: Diagnosis not present

## 2024-03-03 DIAGNOSIS — G4733 Obstructive sleep apnea (adult) (pediatric): Secondary | ICD-10-CM

## 2024-03-03 NOTE — Patient Instructions (Addendum)
 Your Plan:  Would recommend use of weighted utensils to help with eating and weighted pen to help with writing. Also, using a wrist weight (can be purchased on amazon, 1-3 lb weight) which can help steady your tremor  Request follow up with [facility] to discuss other mask options as you are having difficulty with your current mask.  Unfortunately, it does not appear that [facility] offers in home visits but they do have sleep coaches that can help with any questions or concerns. You can reach them at [phone] option 4. Once you receive a new mask, try the recommendations listed below to help with tolerance. If you continue to have difficulty, please reach out to [facility] to discuss other options.   Use your PAP therapy during all hours of sleep, including during naps. Some people may need up to one or two weeks to adjust to PAP therapy, but the benefits are well worth it.  You could also try the following:  Watch TV or read while wearing your mask and headgear. Turn on the PAP device for short periods of time while wearing your mask to get used to the pressure. Use your equipment every night. Keep the device turned on for as long as you can tolerate, building up each night until you have reached at least four hours per night. Continue practicing until you are using PAP therapy during all hours of sleep, including during naps.       Follow up with Dr. Buck in 3-4 months or call earlier if needed     Thank you for coming to see us  at [facility]. I hope we have been able to provide you high quality care today.  You may receive a patient satisfaction survey over the next few weeks. We would appreciate your feedback and comments so that we may continue to improve ourselves and the health of our patients.

## 2024-03-04 ENCOUNTER — Telehealth: Payer: Self-pay

## 2024-03-04 NOTE — Patient Instructions (Signed)
 Joanna Sandoval - I am sorry I was unable to reach you today for our scheduled appointment.   Your next care management appointment is by telephone on Wednesday, December 10 at 09:30 AM  Please call the care guide team at [phone] if you need to cancel, schedule, or reschedule an appointment.   Please call the Suicide and Crisis Lifeline: 988 if you are experiencing a Mental Health or Behavioral Health Crisis or need someone to talk to.  Thank you,   Clayborne Ly RN BSN CCM [place]  [facility], [facility] Nurse Care Coordinator  Direct Dial: [phone] Website: [email or web address]

## 2024-03-05 ENCOUNTER — Ambulatory Visit (HOSPITAL_COMMUNITY)
Admission: RE | Admit: 2024-03-05 | Discharge: 2024-03-05 | Disposition: A | Discharge status: Home / Self Care | Source: Ambulatory Visit | Attending: Nurse Practitioner | Admitting: Nurse Practitioner

## 2024-03-05 ENCOUNTER — Ambulatory Visit (HOSPITAL_COMMUNITY)
Admission: RE | Admit: 2024-03-05 | Discharge: 2024-03-05 | Disposition: A | Source: Ambulatory Visit | Attending: Nurse Practitioner | Admitting: Nurse Practitioner

## 2024-03-05 DIAGNOSIS — R059 Cough, unspecified: Secondary | ICD-10-CM | POA: Insufficient documentation

## 2024-03-05 DIAGNOSIS — R1310 Dysphagia, unspecified: Secondary | ICD-10-CM | POA: Insufficient documentation

## 2024-03-05 DIAGNOSIS — R053 Chronic cough: Secondary | ICD-10-CM

## 2024-03-05 NOTE — Progress Notes (Signed)
 Modified Barium Swallow Study  Patient Details  Name: Joanna Sandoval MRN: [id number] Date of Birth: July 08, 1945  Today's Date: 03/05/2024  Modified Barium Swallow completed.  Full report located under Chart Review in the Imaging Section.  History of Present Illness Patient is a 78 y.o. female with PMH of arthritis, diabetes mellitus, hypercholesteremia, hypertension, OSA on CPAP. Presenting for MBS due to an unspecified cough. Patient shared she feels things get caught when she swallows and occassionally coughs with meals. MBS ordered to rule out aspiration.   Clinical Impression (P) Patient presents with an oropharyngeal swallow that is within functional limits. No penetration or aspiration was observed across any of the tested consistencies. This test does not explain patient's symptoms of coughing with meals or things getting caught. SLP recommending patient continue regular/thin diet. No SLP follow up is needed. Factors that may increase risk of adverse event in presence of aspiration Noe & Lianne 2021):    DIGEST Swallow Severity Rating*  Safety: 0  Efficiency:0  Overall Pharyngeal Swallow Severity: 0 1: mild; 2: moderate; 3: severe; 4: profound  *The Dynamic Imaging Grade of Swallowing Toxicity is standardized for the head and neck cancer population, however, demonstrates promising clinical applications across populations to standardize the clinical rating of pharyngeal swallow safety and severity.   Swallow Evaluation Recommendations Recommendations: (P) PO diet PO Diet Recommendation: (P) Regular;Thin liquids (Level 0) Liquid Administration via: (P) Cup;Straw;Spoon Medication Administration: (P) Other (Comment) (As tolereated.) Supervision: (P) Patient able to self-feed Swallowing strategies  : (P) Slow rate;Small bites/sips Postural changes: (P) Position pt fully upright for meals;Stay upright 30-60 min after meals Oral care recommendations: (P) Oral care BID  (2x/day)     Damien Hy  Graduate SLP Clinican

## 2024-03-07 ENCOUNTER — Emergency Department (HOSPITAL_COMMUNITY)

## 2024-03-07 ENCOUNTER — Encounter (HOSPITAL_COMMUNITY): Payer: Self-pay

## 2024-03-07 ENCOUNTER — Emergency Department (HOSPITAL_COMMUNITY)
Admission: EM | Admit: 2024-03-07 | Discharge: 2024-03-07 | Disposition: A | Discharge status: Home / Self Care | Attending: Emergency Medicine | Admitting: Emergency Medicine

## 2024-03-07 DIAGNOSIS — I10 Essential (primary) hypertension: Secondary | ICD-10-CM | POA: Diagnosis not present

## 2024-03-07 DIAGNOSIS — D72829 Elevated white blood cell count, unspecified: Secondary | ICD-10-CM | POA: Insufficient documentation

## 2024-03-07 DIAGNOSIS — M546 Pain in thoracic spine: Secondary | ICD-10-CM | POA: Insufficient documentation

## 2024-03-07 DIAGNOSIS — M542 Cervicalgia: Secondary | ICD-10-CM | POA: Insufficient documentation

## 2024-03-07 DIAGNOSIS — W19XXXA Unspecified fall, initial encounter: Secondary | ICD-10-CM | POA: Diagnosis not present

## 2024-03-07 DIAGNOSIS — Z79899 Other long term (current) drug therapy: Secondary | ICD-10-CM | POA: Diagnosis not present

## 2024-03-07 DIAGNOSIS — T148XXA Other injury of unspecified body region, initial encounter: Secondary | ICD-10-CM

## 2024-03-07 DIAGNOSIS — R930 Abnormal findings on diagnostic imaging of skull and head, not elsewhere classified: Secondary | ICD-10-CM | POA: Insufficient documentation

## 2024-03-07 DIAGNOSIS — S46911A Strain of unspecified muscle, fascia and tendon at shoulder and upper arm level, right arm, initial encounter: Secondary | ICD-10-CM | POA: Diagnosis not present

## 2024-03-07 DIAGNOSIS — E119 Type 2 diabetes mellitus without complications: Secondary | ICD-10-CM | POA: Insufficient documentation

## 2024-03-07 DIAGNOSIS — S4991XA Unspecified injury of right shoulder and upper arm, initial encounter: Secondary | ICD-10-CM | POA: Diagnosis present

## 2024-03-07 LAB — CBC WITH DIFFERENTIAL/PLATELET
Abs Immature Granulocytes: 0.13 K/uL — ABNORMAL HIGH (ref 0.00–0.07)
Basophils Absolute: 0.1 K/uL (ref 0.0–0.1)
Basophils Relative: 1 %
Eosinophils Absolute: 1.8 K/uL — ABNORMAL HIGH (ref 0.0–0.5)
Eosinophils Relative: 13 %
HCT: 43.8 % (ref 36.0–46.0)
Hemoglobin: 13.9 g/dL (ref 12.0–15.0)
Immature Granulocytes: 1 %
Lymphocytes Relative: 38 %
Lymphs Abs: 5.2 K/uL — ABNORMAL HIGH (ref 0.7–4.0)
MCH: 28.1 pg (ref 26.0–34.0)
MCHC: 31.7 g/dL (ref 30.0–36.0)
MCV: 88.7 fL (ref 80.0–100.0)
Monocytes Absolute: 1.4 K/uL — ABNORMAL HIGH (ref 0.1–1.0)
Monocytes Relative: 11 %
Neutro Abs: 4.8 K/uL (ref 1.7–7.7)
Neutrophils Relative %: 36 %
Platelets: 274 K/uL (ref 150–400)
RBC: 4.94 MIL/uL (ref 3.87–5.11)
RDW: 19.2 % — ABNORMAL HIGH (ref 11.5–15.5)
WBC: 13.4 K/uL — ABNORMAL HIGH (ref 4.0–10.5)
nRBC: 0 % (ref 0.0–0.2)

## 2024-03-07 LAB — BASIC METABOLIC PANEL WITH GFR
Anion gap: 8 (ref 5–15)
BUN: 12 mg/dL (ref 8–23)
CO2: 30 mmol/L (ref 22–32)
Calcium: 9.3 mg/dL (ref 8.9–10.3)
Chloride: 107 mmol/L (ref 98–111)
Creatinine, Ser: 0.92 mg/dL (ref 0.44–1.00)
GFR, Estimated: >60 mL/min (ref >60)
Glucose, Bld: 116 mg/dL — ABNORMAL HIGH (ref 70–99)
Potassium: 4.3 mmol/L (ref 3.5–5.1)
Sodium: 145 mmol/L (ref 135–145)

## 2024-03-07 MED ORDER — TIZANIDINE HCL 4 MG PO TABS
2.0000 mg | ORAL_TABLET | Freq: Once | ORAL | Status: AC
  Administered 2024-03-07: 2 mg via ORAL
  Filled 2024-03-07: qty 1

## 2024-03-07 MED ORDER — ACETAMINOPHEN 500 MG PO TABS
1000.0000 mg | ORAL_TABLET | Freq: Once | ORAL | Status: AC
  Administered 2024-03-07: 1000 mg via ORAL
  Filled 2024-03-07: qty 2

## 2024-03-07 MED ORDER — TIZANIDINE HCL 4 MG PO TABS: 2.0000 mg | ORAL_TABLET | Freq: Two times a day (BID) | ORAL | 0 refills | Status: AC | PRN

## 2024-03-07 MED ORDER — LACTATED RINGERS IV BOLUS
500.0000 mL | Freq: Once | INTRAVENOUS | Status: AC
  Administered 2024-03-07: 500 mL via INTRAVENOUS

## 2024-03-07 MED ORDER — LIDOCAINE 5 % EX PTCH
1.0000 | MEDICATED_PATCH | Freq: Once | CUTANEOUS | Status: DC
  Administered 2024-03-07: 1 via TRANSDERMAL
  Filled 2024-03-07: qty 1

## 2024-03-07 NOTE — ED Notes (Signed)
 Pt was given discharge instructions and verbalized understanding. Pt wheeled to lobby in a wheelchair.

## 2024-03-07 NOTE — ED Triage Notes (Signed)
 Pt c/o R neck pain and burning in base of posterior neck since a fall in July.  Pain score 8/10.  Pt reports using alternating heat and cold w/o relief.  Denies numbness and tingling in arms.

## 2024-03-07 NOTE — ED Provider Notes (Signed)
 [place] [facility] Provider Note   CSN: [id number] Arrival date & time: 03/07/24  9183     History Chief Complaint  Patient presents with   Fall   Neck Pain     HPI: Joanna Sandoval is a 78 y.o. female with history perinent for elevated BMI, HTN, PAD, T2DM, who presents complaining of right sided neck pain. Patient arrived via POV.  History provided by patient.  No interpreter required during this encounter.  Patient reports that on October 18, 2023 she was caring for her grandson who has Down syndrome.  Reports that he was playing on the bed and was sitting close to the edge of bed, therefore she rushed to go try to catch him, and experienced a ground-level fall.  Reports that she did not lose consciousness, does not use anticoagulants.  Reports that she was able to get up on her own and did not seek care or tell anybody about the fall at that time because she was concerned that her children would no longer let her babysit her grandson.  Reports that she had some left-sided rib cage pain at the time that has since improved, however she has had persistent right sided neck pain that she localizes to her sternocleidomastoid and right paraspinal musculature.  Reports that she recently saw her cardiologist who advised using heat and ice which she has been using however has not found sufficient relief with this, therefore she decided to come to the emergency department.  Denies any new falls, chest pain, shortness of breath, numbness, tingling, weakness in extremities.  Prior to Admission medications   Medication Sig Start Date End Date Taking? Authorizing Provider  tiZANidine  (ZANAFLEX ) 4 MG tablet Take 0.5 tablets (2 mg total) by mouth 2 (two) times daily as needed for muscle spasms. 03/07/24  Yes Rogelia Jerilynn RAMAN, MD  Accu-Chek Softclix Lancets lancets USE AS DIRECTED 05/17/23   Georgina Speaks, FNP  albuterol  (VENTOLIN  HFA) 108 [phone] Base) MCG/ACT inhaler  Inhale 2 puffs into the lungs every 6 (six) hours as needed. 11/04/23   Georgina Speaks, FNP  atenolol  (TENORMIN ) 100 MG tablet Take 100 mg by mouth daily.    [provider]  atorvastatin  (LIPITOR) 40 MG tablet Take 40 mg by mouth at bedtime. 10/11/22   [provider]  benzonatate  (TESSALON ) 100 MG capsule Take 1 capsule (100 mg total) by mouth 2 (two) times daily as needed (Throat soothing). 09/28/23   Von Bellis, MD  Blood Glucose Monitoring Suppl (ACCU-CHEK GUIDE) w/Device KIT USE TO CHECK BLOOD SUGAR TWICE  DAILY AT 10AM AND 5 PM 02/28/23   Georgina Speaks, FNP  Blood Pressure Monitoring (ADULT BLOOD PRESSURE CUFF LG) KIT Take blood pressure daily and as needed 08/15/23   Georgina Speaks, FNP  D3-50 50000 units capsule Take 50,000 Units by mouth once a week. 01/14/17   [provider]  dapagliflozin  propanediol (FARXIGA ) 10 MG TABS tablet Take 1 tablet (10 mg total) by mouth daily before breakfast. 11/04/23   Georgina Speaks, FNP  dorzolamide -timolol  (COSOPT ) 2-0.5 % ophthalmic solution Place 1 drop into both eyes 2 (two) times daily. 05/10/22   [provider]  empagliflozin  (JARDIANCE ) 10 MG TABS tablet Take 1 tablet (10 mg total) by mouth daily. 01/16/24   Georgina Speaks, FNP  fluticasone  (FLONASE ) 50 MCG/ACT nasal spray Place 2 sprays into both nostrils daily. 08/15/23 08/14/24  Georgina Speaks, FNP  fluticasone  furoate-vilanterol (BREO ELLIPTA ) 200-25 MCG/ACT AEPB Inhale 1 puff into  the lungs daily. 09/29/23 09/28/24  Von Bellis, MD  glimepiride  (AMARYL ) 2 MG tablet TAKE 1 TABLET BY MOUTH IN THE  MORNING AND AT BEDTIME 09/04/23   Georgina Speaks, FNP  glucose blood (ACCU-CHEK GUIDE) test strip USE AS DIRECTED 02/28/23   Georgina Speaks, FNP  guaiFENesin  (MUCINEX ) 600 MG 12 hr tablet Take 1 tablet (600 mg total) by mouth 2 (two) times daily. 01/29/24 01/28/25  Hattar, Zola SAILOR, MD  guaiFENesin -dextromethorphan  (ROBITUSSIN DM) 100-10 MG/5ML syrup Take 10 mLs by mouth every 6  (six) hours as needed for cough. 09/28/23   Von Bellis, MD  [name] KIT Use to test blood sugar twice daily as directed. E11.65 10/31/23   [name], [name], FNP  latanoprost  (XALATAN ) 0.005 % ophthalmic solution Place 1 drop into both eyes at bedtime. 05/16/19   [provider]  losartan  (COZAAR ) 50 MG tablet Take 50 mg by mouth daily.    [provider]     Allergies: Grass pollen([id number]-o-[id number]-swt vern)   Review of Systems   ROS as per HPI  Physical Exam Updated Vital Signs BP (!) 146/71   Pulse (!) 58   Temp (!) 97.2 F (36.2 C) (Oral)   Resp 16   Ht 5' 1 (1.549 m)   Wt 88.9 kg   SpO2 98%   BMI 37.03 kg/m  Physical Exam Vitals and nursing note reviewed.  Constitutional:      General: She is not in acute distress.    Appearance: She is well-developed.  HENT:     Head: Normocephalic and atraumatic.  Eyes:     Conjunctiva/sclera: Conjunctivae normal.  Cardiovascular:     Rate and Rhythm: Normal rate and regular rhythm.     Heart sounds: No murmur heard. Pulmonary:     Effort: Pulmonary effort is normal. No respiratory distress.     Breath sounds: Normal breath sounds.  Abdominal:     Palpations: Abdomen is soft.     Tenderness: There is no abdominal tenderness.  Musculoskeletal:        General: No swelling.     Cervical back: Neck supple. Tenderness (Tenderness to palpation of the right sternocleidomastoid, right trapezius and right paraspinal musculature) present. No bony tenderness.     Thoracic back: Tenderness (Newness to palpation of the right trapezius) present. No bony tenderness.     Lumbar back: No bony tenderness.  Skin:    General: Skin is warm and dry.     Capillary Refill: Capillary refill takes less than 2 seconds.  Neurological:     Mental Status: She is alert.  Psychiatric:        Mood and Affect: Mood normal.     ED Course/ Medical Decision Making/ A&P    Procedures Procedures   Medications  Ordered in ED Medications  lactated ringers  bolus 500 mL (0 mLs Intravenous Stopped 03/07/24 1258)  tiZANidine  (ZANAFLEX ) tablet 2 mg (2 mg Oral Given 03/07/24 1150)  acetaminophen  (TYLENOL ) tablet 1,000 mg (1,000 mg Oral Given 03/07/24 1149)    Medical Decision Making:   MATTIE NORDELL is a 78 y.o. female who presents for persistent neck pain after remote ground-level fall as per above.  Physical exam is pertinent for tenderness palpation of the right sternocleidomastoid, trapezius, paraspinal musculature, otherwise NVI.   The differential includes but is not limited to ICH, TBI, skull fracture, spinal fracture/dislocation, blunt thoracic trauma, hemothorax, pneumothorax, rib fractures, blunt abdominal trauma, hemorrhage, extremity fracture, dislocation.  Independent historian: None  External  data reviewed: Labs: reviewed prior labs for baseline  Labs: Ordered and Independent interpretation CBC: CBC with mild leukocytosis to 13.4, patient has baseline leukocytosis on comparison to prior.  No anemia or thrombocytopenia. BMP: No AKI or emergent electrolyte derangement  Radiology: Ordered, Independent interpretation, and All images reviewed independently.  Agree with radiology report at this time.   CXR: No acute cardiac or pulmonary abnormality. No appreciable rib fx. No PTX.  CT head: No ICH or displaced skull fracture CT C-spine: No displaced fracture or dislocation Extremity XR's: I do not appreciate displaced fracture or dislocation of the x-ray of the patient's left shoulder CT Cervical Spine Wo Contrast Result Date: 03/07/2024 EXAM: CT CERVICAL SPINE WITHOUT CONTRAST 03/07/2024 11:38:39 AM TECHNIQUE: CT of the cervical spine was performed without the administration of intravenous contrast. Multiplanar reformatted images are provided for review. Automated exposure control, iterative reconstruction, and/or weight based adjustment of the mA/kV was utilized to reduce the radiation  dose to as low as reasonably achievable. COMPARISON: None available. CLINICAL HISTORY: Neck trauma (Age >= 65y) FINDINGS: CERVICAL SPINE: BONES AND ALIGNMENT: Prominent straightening of the normal cervical lordosis. There is trace anterolisthesis of C4 on C5 and C5 on C6. No evidence of traumatic malalignment. There is partial fusion of the C2 and C3 vertebral bodies posteriorly. Asymmetric degenerative changes at the left atlantoaxial articulation. No acute fracture. DEGENERATIVE CHANGES: Mild disc space narrowing at multiple levels, most pronounced at C5-C6. Facet arthrosis and uncovertebral hypertrophy throughout the cervical spine. Disc bulges at multiple levels. No high grade osseous spinal canal stenosis. There is significant foraminal stenosis at multiple levels throughout the cervical spine, most pronounced at the C3-C4 level. SOFT TISSUES: No prevertebral soft tissue swelling. Atherosclerosis at the carotid bifurcation. IMPRESSION: 1. No acute abnormality of the cervical spine. 2. Degenerative changes as above. Electronically signed by: Donnice Mania MD 03/07/2024 12:29 PM EST RP Workstation: [id number]   DG Shoulder Right Result Date: 03/07/2024 EXAM: 1 VIEW(S) XRAY OF THE SHOULDER 03/07/2024 11:19:00 AM COMPARISON: None available. CLINICAL HISTORY: fall FINDINGS: BONES AND JOINTS: Moderate degenerative changes of the glenohumeral joint with joint space narrowing and osteophyte formation. Moderate degenerative changes of the AC joint. Irregularity of the distal clavicle possibly related to prior trauma recommend correlation with focal tenderness on exam. There is prominence of the deltoid tuberosity without evidence of avulsion fracture. No acute fracture or dislocation. SOFT TISSUES: No abnormal calcifications. Visualized lung is unremarkable. IMPRESSION: 1. Irregularity of the distal clavicle possibly related to prior trauma. Recommend evaluation for focal tenderness on exam. 2. Moderate degenerative  changes of the glenohumeral joint with joint space narrowing and osteophyte formation. 3. Moderate degenerative changes of the acromioclavicular joint. Electronically signed by: Donnice Mania MD 03/07/2024 12:24 PM EST RP Workstation: [id number]   DG Chest 2 View Result Date: 03/07/2024 EXAM: 2 VIEW(S) XRAY OF THE CHEST 03/07/2024 11:19:00 AM COMPARISON: 09/24/2023 CLINICAL HISTORY: fall FINDINGS: LUNGS AND PLEURA: Low lung volumes. No focal pulmonary opacity. No pleural effusion. No pneumothorax. HEART AND MEDIASTINUM: Calcified aorta. No acute abnormality of the cardiac and mediastinal silhouettes. BONES AND SOFT TISSUES: Thoracic degenerative changes. No acute osseous abnormality. IMPRESSION: 1. No acute cardiopulmonary process. Electronically signed by: Donnice Mania MD 03/07/2024 12:18 PM EST RP Workstation: [id number]   CT Head Wo Contrast Result Date: 03/07/2024 EXAM: CT HEAD WITHOUT CONTRAST 03/07/2024 11:38:39 AM TECHNIQUE: CT of the head was performed without the administration of intravenous contrast. Automated exposure control, iterative reconstruction, and/or weight based adjustment of the mA/kV  was utilized to reduce the radiation dose to as low as reasonably achievable. COMPARISON: MRI head 04/30/23. CLINICAL HISTORY: Head trauma, minor (Age >= 65y). FINDINGS: BRAIN AND VENTRICLES: No acute hemorrhage. No evidence of acute infarct. No hydrocephalus. No extra-axial collection. No mass effect or midline shift. Punctate bilateral basal ganglia calcifications. Atherosclerosis of the carotid siphons and intracranial right vertebral artery. ORBITS: Bilateral lens replacement. SINUSES: Small mucous retention cysts or polyps in right maxillary sinus. SOFT TISSUES AND SKULL: No acute soft tissue abnormality. No skull fracture. Hyperostosis frontalis interna. IMPRESSION: 1. No acute intracranial abnormality. Electronically signed by: Donnice Mania MD 03/07/2024 11:54 AM EST RP Workstation: [id number]   [facility]E [facility] OP MEDICARE SPEECH PATH Result Date: 03/05/2024 Table formatting from the original result was not included. Modified Barium Swallow Study Patient Details Name: KHILYNN BORNTREGER MRN: [id number] Date of Birth: Aug 21, 1945 Today's Date: 03/05/2024 HPI/PMH: HPI: Patient is a 78 y.o. female with PMH of arthritis, diabetes mellitus, hypercholesteremia, hypertension, OSA on CPAP. Presenting for MBS due to an unspecified cough. Patient shared she feels things get caught when she swallows and occassionally coughs with meals. MBS ordered to rule out aspiration. Clinical Impression: Clinical Impression: Patient presents with an oropharyngeal swallow that is within functional limits. No penetration or aspiration was observed across any of the tested consistencies. This test does not explain patient's symptoms of coughing with meals or things getting caught. SLP recommending patient continue regular/thin diet. No SLP follow up is needed. Recommendations/Plan: Swallowing Evaluation Recommendations Swallowing Evaluation Recommendations Recommendations: PO diet PO Diet Recommendation: Regular; Thin liquids (Level 0) Liquid Administration via: Cup; Straw; Spoon Medication Administration: Other (Comment) (As tolereated.) Supervision: Patient able to self-feed Swallowing strategies  : Slow rate; Small bites/sips Postural changes: Position pt fully upright for meals; Stay upright 30-60 min after meals Oral care recommendations: Oral care BID (2x/day) Treatment Plan Treatment Plan Treatment recommendations: No treatment recommended at this time Follow-up recommendations: No SLP follow up Recommendations Recommendations for follow up therapy are one component of a multi-disciplinary discharge planning process, led by the attending physician.  Recommendations may be updated based on patient status, additional functional criteria and insurance authorization. Assessment: Orofacial Exam: Orofacial Exam Oral Cavity: Oral  Hygiene: WFL Oral Cavity - Dentition: Missing dentition Orofacial Anatomy: WFL Oral Motor/Sensory Function: WFL Anatomy: Anatomy: WFL Boluses Administered: Boluses Administered Boluses Administered: Thin liquids (Level 0); Mildly thick liquids (Level 2, nectar thick); Moderately thick liquids (Level 3, honey thick); Puree; Solid  Oral Impairment Domain: Oral Impairment Domain Lip Closure: No labial escape Tongue control during bolus hold: Cohesive bolus between tongue to palatal seal Bolus preparation/mastication: Timely and efficient chewing and mashing Bolus transport/lingual motion: Brisk tongue motion Oral residue: Complete oral clearance Location of oral residue : N/A Initiation of pharyngeal swallow : Posterior angle of the ramus  Pharyngeal Impairment Domain: Pharyngeal Impairment Domain Soft palate elevation: No bolus between soft palate (SP)/pharyngeal wall (PW) Laryngeal elevation: Complete superior movement of thyroid  cartilage with complete approximation of arytenoids to epiglottic petiole Anterior hyoid excursion: Complete anterior movement Epiglottic movement: Complete inversion Laryngeal vestibule closure: Complete, no air/contrast in laryngeal vestibule Pharyngeal stripping wave : Present - complete Pharyngeal contraction (A/P view only): N/A Pharyngoesophageal segment opening: Complete distension and complete duration, no obstruction of flow Tongue base retraction: No contrast between tongue base and posterior pharyngeal wall (PPW) Pharyngeal residue: Complete pharyngeal clearance Location of pharyngeal residue: N/A  Esophageal Impairment Domain: Esophageal Impairment Domain Esophageal clearance upright position: Complete clearance, esophageal coating  Pill: Pill Consistency administered: Thin liquids (Level 0) Thin liquids (Level 0): [facility] Penetration/Aspiration Scale Score: Penetration/Aspiration Scale Score 1.  Material does not enter airway: Thin liquids (Level 0); Mildly thick liquids (Level 2,  nectar thick); Puree; Moderately thick liquids (Level 3, honey thick); Solid; Pill Compensatory Strategies: Compensatory Strategies Compensatory strategies: No   General Information: Caregiver present: No  Diet Prior to this Study: Regular; Thin liquids (Level 0)   Temperature : Normal   Respiratory Status: WFL   Supplemental O2: None (Room air)   History of Recent Intubation: No  Behavior/Cognition: Alert; Cooperative; Pleasant mood Self-Feeding Abilities: Able to self-feed; Needs assist with self-feeding Baseline vocal quality/speech: Normal No data recorded No data recorded Exam Limitations: No limitations Goal Planning: Consulted and agree with results and recommendations: Patient Pain: Pain Assessment Pain Assessment: No/denies pain End of Session: Start Time:SLP Start Time (ACUTE ONLY): 1046 Stop Time: SLP Stop Time (ACUTE ONLY): 1107 Time Calculation:SLP Time Calculation (min) (ACUTE ONLY): 21 min Charges: SLP Evaluations $ SLP Speech Visit: 1 Visit SLP Evaluations $Outpatient MBS Swallow: 1 Procedure SLP visit diagnosis: SLP Visit Diagnosis: Dysphagia, unspecified (R13.10) Past Medical History: Past Medical History: Diagnosis Date  Arthritis   Diabetes mellitus   Hypercholesteremia   Hypertension   Post-menopausal bleeding   Postmenopausal bleeding 08/30/2011  Normal EBX 09/2011. Past Surgical History: Past Surgical History: Procedure Laterality Date  CATARACT EXTRACTION    COLONOSCOPY    DILATION AND CURETTAGE OF UTERUS    HYSTEROSCOPY WITH D & C N/A 05/02/2017  Procedure: DILATATION AND CURETTAGE /HYSTEROSCOPY;  Surgeon: Alger Gong, MD;  Location: [facility];  Service: Gynecology;  Laterality: N/A;  KNEE ARTHROSCOPY   Norleen IVAR Blase, MA, CCC-SLP Speech Therapy CLINICAL DATA:  Dysphagia, coughing with meals. EXAM: MODIFIED BARIUM SWALLOW TECHNIQUE: Different consistencies of barium were administered orally to the patient by the Speech Pathologist. Imaging of the pharynx was performed in  the lateral projection. Kacie Duvall PA-C was present in the fluoroscopy room during this study, which was supervised and interpreted by Jackquline Boxer, MD. Radiologist, not in attendance for the exam. FLUOROSCOPY: Radiation Exposure Index (as provided by the fluoroscopic device): 29.52 mGy Kerma COMPARISON:  None Available. FINDINGS: Vestibular  Penetration:  None seen. Aspiration:  None seen. Other:  None. IMPRESSION: Please refer to the Speech Pathologists report for complete details and recommendations. Electronically Signed   By: Jackquline Boxer M.D.   On: 03/05/2024 12:34   EKG/Medicine tests: Not indicated                Interventions: LR bolus, lidocaine  patch, tizanidine , Tylenol   See the EMR for full details regarding lab and imaging results.  Currently, patient is awake, alert, and protecting own airway and is hemodynamically stable.  Patient has persistent muscular tenderness after remote fall, however given patient's age, patient is at risk for the [place] CT head and CT C-spine rule, therefore do feel that patient requires screening CT head and CT C-spine.  Additionally given patient localizes pain to the right neck and shoulder, will obtain screening x-ray of the right shoulder to eval for remote fracture or degenerative changes, as well as chest x-ray given patient reports that she initially had left-sided rib pain.  Will treat patient with multimodal pain therapy.  Additionally patient was mildly tachycardic on repeat vitals, potentially related to ambulation, however given this vital sign derangement, will give gentle 500 cc LR bolus and obtain screening labs.  Review of prior labs does demonstrate  that patient has a chronic mild leukocytosis which is at baseline today.  Labs otherwise reassuring.  Imaging reveals remote clavicular fracture, however no evidence of C-spine injury or ICH.  Patient feeling much better after pain medications.  Discussed likely muscle strain as etiology of  symptoms, multimodal pain therapy and plan for follow-up with PCP.  Patient comfortable with this plan, discharged in stable condition.  Presentation is most consistent with acute uncomplicated illness, Current presentation is complicated by underlying chronic conditions, and I did consider and rule out acute life/limb-threatening illness  Discussion of management or test interpretations with external provider(s): Not indicated  Risk Drugs:OTC drugs and Prescription drug management  Disposition: DISCHARGE: I believe that the patient is safe for discharge home with outpatient follow-up. Patient was informed of all pertinent physical exam, laboratory, and imaging findings. Patient's suspected etiology of their symptom presentation was discussed with the patient and all questions were answered. We discussed following up with PCP. I provided thorough ED return precautions. The patient feels safe and comfortable with this plan.  MDM generated using voice dictation software and may contain dictation errors.  Please contact me for any clarification or with any questions.  Clinical Impression:  1. Neck pain   2. Fall, initial encounter   3. Muscle strain      Discharge   Final Clinical Impression(s) / ED Diagnoses Final diagnoses:  Neck pain  Fall, initial encounter  Muscle strain    Rx / DC Orders ED Discharge Orders          Ordered    tiZANidine  (ZANAFLEX ) 4 MG tablet  2 times daily PRN        03/07/24 1409             Rogelia Jerilynn RAMAN, MD 03/10/24 1740

## 2024-03-07 NOTE — Discharge Instructions (Addendum)
 Joanna Sandoval  Thank you for allowing us  to take care of you today.  You came to the Emergency Department today because you had a fall several months ago and since then you have had some pain in the right side of your neck.  Here in the emergency department we got a CT of your head and your neck, and we are not seeing any bleeding in your head or broken bones in your head or neck.  Your chest x-ray and your shoulder x-ray show that you broke your collarbone at the end at your shoulder at some point in the past, maybe when you fell several months ago, maybe a long time ago, however there is no specific treatment that you need for now since it has healed.  Your pain in your neck is related to some muscle soreness.  You can use Tylenol  and ibuprofen  every 4-6 hours as needed for pain control.  These medications can be purchased over-the-counter.  You can also use lidocaine  patches.  These can also be purchased at [employer] and grocery stores.  Additionally we will prescribe you tizanidine , a muscle relaxer, you can use this twice a day as needed for muscle pain or soreness.  Please do not drive on this medication as it can make you sleepy.  To-Do: 1. Please follow-up with your primary doctor within 1 - 2 weeks / as soon as possible.   Please return to the Emergency Department or call 911 if you experience have worsening of your symptoms, or do not get better, chest pain, shortness of breath, severe or significantly worsening pain, high fever, severe confusion, pass out or have any reason to think that you need emergency medical care.   We hope you feel better soon.   Mitzie Later, MD Department of Emergency Medicine [facility]

## 2024-03-21 ENCOUNTER — Encounter

## 2024-03-24 ENCOUNTER — Ambulatory Visit

## 2024-03-26 ENCOUNTER — Other Ambulatory Visit: Payer: Self-pay

## 2024-03-26 NOTE — Patient Outreach (Signed)
 Complex Care Management   Visit Note  03/26/2024  Name:  Joanna Sandoval MRN: [id number] DOB: 1945-06-25  Situation: Referral received for Complex Care Management related to DMII, OSA w/CPAP, chronic cough, Interstitial Lung Disease, Glaucoma, Fall with neck injury. I obtained verbal consent from Patient.  Visit completed with Patient on the phone.  Background:   Past Medical History:  Diagnosis Date   Arthritis    Diabetes mellitus    Hypercholesteremia    Hypertension    Post-menopausal bleeding    Postmenopausal bleeding 08/30/2011   Normal EBX 09/2011.    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 Debarah  HEENT HEENT Symptoms Reported: Other: (throat clearing) HEENT Management Strategies: Routine screening HEENT Self-Management Outcome: 3 (uncertain)    Cardiovascular Cardiovascular Symptoms Reported: Not assessed    Respiratory Respiratory Symptoms Reported: Dry cough Respiratory Management Strategies: Routine screening, Medication therapy Respiratory Self-Management Outcome: 4 (good)  Endocrine Endocrine Symptoms Reported: No symptoms reported Is patient diabetic?: Yes Is patient checking blood sugars at home?: Yes List most recent blood sugar readings, include date and time of day: FBS 120 Endocrine Self-Management Outcome: 4 (good)  Gastrointestinal Gastrointestinal Symptoms Reported: No symptoms reported       Genitourinary Genitourinary Symptoms Reported: No symptoms reported    Integumentary Integumentary Symptoms Reported: Not assessed    Musculoskeletal Musculoskelatal Symptoms Reviewed: Limited mobility, Unsteady gait Musculoskeletal Management Strategies: Medical device, Adequate rest, Routine screening Musculoskeletal Self-Management Outcome: 3 (uncertain) Falls in the past year?: Yes Number of falls in past year: 1 or less Was there an injury with Fall?: Yes Fall Risk Category Calculator: 2 Patient Fall Risk Level: Moderate Fall Risk Patient at Risk for Falls Due to: History of fall(s), Impaired balance/gait, Impaired mobility, Impaired vision Fall risk Follow up: Falls evaluation completed, Education provided  Psychosocial Psychosocial Symptoms Reported: No symptoms reported     Quality of Family Relationships: helpful, involved, supportive Do you feel physically threatened by others?: No    03/26/2024    PHQ2-9 Depression Screening   Andreka Stucki interest or pleasure in doing things    Feeling down, depressed, or hopeless    PHQ-2 - Total Score    Trouble falling or staying asleep, or sleeping too much    Feeling tired or having Aaria Happ energy    Poor appetite or overeating     Feeling bad about yourself - or that you are a failure or have let yourself or your family down    Trouble concentrating on things, such as reading the newspaper or watching television    Moving or speaking so slowly that other people could have noticed.  Or the opposite - being so fidgety or restless that you have been moving around a lot more than usual    Thoughts that you would be better off dead, or hurting yourself in some way    PHQ2-9 Total Score    If you checked off any problems, how difficult have these problems made it for you to do your work, take care of things at home, or get along with  other people    Depression Interventions/Treatment      There were no vitals filed for this  visit. Pain Scale: 0-10 Pain Score: 5  Pain Type: Acute pain Pain Location: Neck Pain Orientation: Posterior Pain Descriptors / Indicators: Sore Pain Onset: On-going Pain Intervention(s): Rest, Medication (See eMAR) Multiple Pain Sites: No  Medications Reviewed Today     Reviewed by [name], RN (Registered Nurse) on 03/26/24 at [phone]  Med List Status: <None>   Medication Order Taking? Sig Documenting Provider Last Dose Status Informant  Accu-Chek Softclix Lancets lancets [id number]  USE AS DIRECTED Georgina Speaks, FNP  Active Self, Pharmacy Records  albuterol  (VENTOLIN  HFA) 108 [phone] Base) MCG/ACT inhaler [id number]  Inhale 2 puffs into the lungs every 6 (six) hours as needed. Georgina Speaks, FNP  Active   atenolol  (TENORMIN ) 100 MG tablet [id number]  Take 100 mg by mouth daily. [provider]  Active Self, Pharmacy Records  atorvastatin  (LIPITOR) 40 MG tablet [id number]  Take 40 mg by mouth at bedtime. [provider]  Active Self, Pharmacy Records  benzonatate  (TESSALON ) 100 MG capsule [id number]  Take 1 capsule (100 mg total) by mouth 2 (two) times daily as needed (Throat soothing). Von Bellis, MD  Active   Blood Glucose Monitoring Suppl (ACCU-CHEK GUIDE) w/Device KIT [id number]  USE TO CHECK BLOOD SUGAR TWICE  DAILY AT 10AM AND 5 PM Georgina Speaks, FNP  Active Self, Pharmacy Records  Blood Pressure Monitoring (ADULT BLOOD PRESSURE CUFF LG) KIT [id number]  Take blood pressure daily and as needed Georgina Speaks, FNP  Active Self, Pharmacy Records  D3-50 50000 units capsule [id number]  Take 50,000 Units by mouth once a week. [provider]  Active Self, Pharmacy Records  dapagliflozin  propanediol (FARXIGA ) 10 MG TABS tablet [id number]  Take 1 tablet (10 mg total) by mouth daily before breakfast. Georgina Speaks, FNP  Active   dorzolamide -timolol  (COSOPT ) 2-0.5 % ophthalmic solution [id number]  Place 1 drop into both eyes 2 (two) times daily. [provider]   Active Self, Pharmacy Records  empagliflozin  (JARDIANCE ) 10 MG TABS tablet [id number]  Take 1 tablet (10 mg total) by mouth daily. Georgina Speaks, FNP  Active   fluticasone  (FLONASE ) 50 MCG/ACT nasal spray [id number]  Place 2 sprays into both nostrils daily. Georgina Speaks, FNP  Active Self, Pharmacy Records  fluticasone  furoate-vilanterol (BREO ELLIPTA ) 200-25 MCG/ACT AEPB [id number]  Inhale 1 puff into the lungs daily. Von Bellis, MD  Active   glimepiride  (AMARYL ) 2 MG tablet [id number]  TAKE 1 TABLET BY MOUTH IN THE  MORNING AND AT BEDTIME Georgina Speaks, FNP  Active Self, Pharmacy Records  glucose blood (ACCU-CHEK GUIDE) test strip [id number]  USE AS DIRECTED Georgina Speaks, FNP  Active Self, Pharmacy Records  guaiFENesin  (MUCINEX ) 600 MG 12 hr tablet [id number]  Take 1 tablet (600 mg total) by mouth 2 (two) times daily. Zaida Zola SAILOR, MD  Active   guaiFENesin -dextromethorphan  (ROBITUSSIN DM) 100-10 MG/5ML syrup [id number]  Take 10 mLs by mouth every 6 (six) hours as needed for cough. Von Bellis, MD  Active   Lancets Misc. [facility] FASTCLIX LANCET) KIT [id number]  Use to test blood sugar twice daily as directed. E11.65 Georgina Speaks, FNP  Active   latanoprost  (XALATAN ) 0.005 % ophthalmic solution [id number]  Place 1 drop into both eyes at bedtime. [provider]  Active Self, Pharmacy Records  losartan  (COZAAR ) 50 MG tablet [id number]  Take 50 mg by mouth daily. [provider]  Active Self, Pharmacy Records  tiZANidine  (ZANAFLEX ) 4 MG tablet [id number]  Take 0.5 tablets (2 mg total) by mouth 2 (two) times daily as needed for muscle spasms. Rogelia Jerilynn RAMAN, MD  Active             Recommendation:   PCP Follow-up  04/08/2024 Status: Sch   Time: 2:40 PM Length: 40  Visit Type: HOSPITAL FOLLOW UP [8005] Copay: $0.00  Provider: Jarold Medici, MD Department: [facility]    04/22/2024 Status: Sch   Time: 8:40 AM Length: 20  Visit Type: OFFICE VISIT [8002] Copay: $0.00   Provider: Georgina Speaks, FNP Department: [facility]   Follow Up Plan:   Telephone follow up appointment date/time:    04/24/2024 Status: Sch   Time: 9:30 AM Length: 30  Visit Type: [facility] TELEPHONE CALL 30 [2502] Copay: $0.00  Provider: [name], RN Department: [facility]   Clayborne Morgan RN BSN CCM [place]  [facility], [facility] Nurse Care Coordinator  Direct Dial: [phone] Website: [email or web address]

## 2024-03-26 NOTE — Patient Instructions (Signed)
 Visit Information  Thank you for taking time to visit with me today. Please don't hesitate to contact me if I can be of assistance to you before our next scheduled appointment.  Your next care management appointment is by telephone on Thursday, January 8 at 09:30 AM  Please call the care guide team at [phone] if you need to cancel, schedule, or reschedule an appointment.   Please call [phone]-TALK (toll free, 24 hour hotline) if you are experiencing a Mental Health or Behavioral Health Crisis or need someone to talk to.  Clayborne Ly RN BSN CCM [place]  [facility], [facility] Nurse Care Coordinator  Direct Dial: [phone] Website: [email or web address]

## 2024-04-07 ENCOUNTER — Other Ambulatory Visit: Payer: Self-pay | Admitting: Nurse Practitioner

## 2024-04-08 ENCOUNTER — Inpatient Hospital Stay: Payer: Self-pay | Admitting: Internal Medicine

## 2024-04-22 ENCOUNTER — Ambulatory Visit (INDEPENDENT_AMBULATORY_CARE_PROVIDER_SITE_OTHER): Payer: Self-pay | Admitting: Nurse Practitioner

## 2024-04-22 ENCOUNTER — Encounter: Payer: Self-pay | Admitting: Nurse Practitioner

## 2024-04-22 VITALS — BP 112/64 | HR 78 | Temp 98.1°F | Ht 61.0 in | Wt 196.0 lb

## 2024-04-22 DIAGNOSIS — E1151 Type 2 diabetes mellitus with diabetic peripheral angiopathy without gangrene: Secondary | ICD-10-CM

## 2024-04-22 DIAGNOSIS — Z79899 Other long term (current) drug therapy: Secondary | ICD-10-CM | POA: Diagnosis not present

## 2024-04-22 DIAGNOSIS — J849 Interstitial pulmonary disease, unspecified: Secondary | ICD-10-CM | POA: Insufficient documentation

## 2024-04-22 DIAGNOSIS — I700 Atherosclerosis of aorta: Secondary | ICD-10-CM | POA: Diagnosis not present

## 2024-04-22 DIAGNOSIS — I119 Hypertensive heart disease without heart failure: Secondary | ICD-10-CM | POA: Diagnosis not present

## 2024-04-22 DIAGNOSIS — M542 Cervicalgia: Secondary | ICD-10-CM | POA: Diagnosis not present

## 2024-04-22 LAB — LIPID PANEL
Chol/HDL Ratio: 3.2 ratio (ref 0.0–4.4)
Cholesterol, Total: 171 mg/dL (ref 100–199)
HDL: 53 mg/dL
LDL Chol Calc (NIH): 94 mg/dL (ref 0–99)
Triglycerides: 136 mg/dL (ref 0–149)
VLDL Cholesterol Cal: 24 mg/dL (ref 5–40)

## 2024-04-22 LAB — CBC
Hematocrit: 42.4 % (ref 34.0–46.6)
Hemoglobin: 13.4 g/dL (ref 11.1–15.9)
MCH: 27.9 pg (ref 26.6–33.0)
MCHC: 31.6 g/dL (ref 31.5–35.7)
MCV: 88 fL (ref 79–97)
Platelets: 293 x10E3/uL (ref 150–450)
RBC: 4.81 x10E6/uL (ref 3.77–5.28)
RDW: 18.3 % — ABNORMAL HIGH (ref 11.7–15.4)
WBC: 14.1 x10E3/uL — ABNORMAL HIGH (ref 3.4–10.8)

## 2024-04-22 LAB — CMP14+EGFR
ALT: 10 IU/L (ref 0–32)
AST: 13 IU/L (ref 0–40)
Albumin: 4.3 g/dL (ref 3.8–4.8)
Alkaline Phosphatase: 121 IU/L (ref 49–135)
BUN/Creatinine Ratio: 20 (ref 12–28)
BUN: 17 mg/dL (ref 8–27)
Bilirubin Total: 0.7 mg/dL (ref 0.0–1.2)
CO2: 29 mmol/L (ref 20–29)
Calcium: 9.6 mg/dL (ref 8.7–10.3)
Chloride: 103 mmol/L (ref 96–106)
Creatinine, Ser: 0.83 mg/dL (ref 0.57–1.00)
Globulin, Total: 2.8 g/dL (ref 1.5–4.5)
Glucose: 115 mg/dL — ABNORMAL HIGH (ref 70–99)
Potassium: 4.2 mmol/L (ref 3.5–5.2)
Sodium: 145 mmol/L — ABNORMAL HIGH (ref 134–144)
Total Protein: 7.1 g/dL (ref 6.0–8.5)
eGFR: 73 mL/min/1.73

## 2024-04-22 LAB — HEMOGLOBIN A1C
Est. average glucose Bld gHb Est-mCnc: 237 mg/dL
Hgb A1c MFr Bld: 9.9 % — ABNORMAL HIGH (ref 4.8–5.6)

## 2024-04-22 LAB — TSH: TSH: 0.835 u[IU]/mL (ref 0.450–4.500)

## 2024-04-22 MED ORDER — EMPAGLIFLOZIN 10 MG PO TABS: 10.0000 mg | ORAL_TABLET | Freq: Every day | ORAL | 2 refills | Status: AC

## 2024-04-22 NOTE — Patient Instructions (Signed)

## 2024-04-22 NOTE — Assessment & Plan Note (Addendum)
 She is encouraged to strive for BMI less than 30 to decrease cardiac risk. Advised to aim for at least 150 minutes of exercise per week.

## 2024-04-22 NOTE — Progress Notes (Signed)
 I,Jameka J Llittleton, CMA,acting as a neurosurgeon for [employer], FNP.,have documented all relevant documentation on the behalf of [name], FNP,as directed by  [name], FNP while in the presence of [name], FNP.  Subjective:  Patient ID: Joanna Sandoval , female    DOB: 10-14-1945 , 79 y.o.   MRN: [id number]  Chief Complaint  Patient presents with   Diabetes    Patient presents today for a diabetes check. Patient reports compliance with her meds. Patient reports she is confused on whether she needs to take her jardiance  and Farxiga  or if she is supposed to stop one of them.    HPI Discussed the use of AI scribe software for clinical note transcription with the patient, who gave verbal consent to proceed.  History of Present Illness Joanna Sandoval is a 79 year old female with diabetes who presents for a follow-up visit regarding medication management and neck pain.  She is experiencing confusion regarding her diabetes medications, specifically Farxiga  and Jardiance . She has been taking both medications, with Farxiga  in the morning and Jardiance  at night. Her blood sugar levels have been inconsistent, and she has attempted to avoid sweets during the holidays. She is no longer taking Rybelsus  due to constipation issues.  She experienced a fall in July, resulting in ongoing neck pain. She went to the emergency room after her fall, where she was told she may have had a collarbone fracture, though she is unsure about the diagnosis. She continues to experience neck pain, especially when turning her neck, and has been using a neck massager for relief. She has taken tizanidine  for pain management as previously prescribed.  She has been to an eye doctor, who advised her to continue using eye drops and to return for follow-up appointments. She was previously under the care of another doctor, who retired, and she has since transitioned to the current doctor's care.  She had been taking  both farxiga  and jardiance , will stop the farxiga .   Hypertension This is a chronic problem. The current episode started more than 1 year ago. The problem is unchanged. The problem is controlled. Pertinent negatives include no anxiety, headaches or shortness of breath. There are no associated agents to hypertension. Risk factors for coronary artery disease include obesity and sedentary lifestyle. Past treatments include angiotensin blockers. Compliance problems include exercise.  Hypertensive end-organ damage includes retinopathy. There is no history of angina. There is no history of chronic renal disease.  Diabetes She presents for her follow-up diabetic visit. She has type 2 diabetes mellitus. Her disease course has been stable. Pertinent negatives for hypoglycemia include no dizziness or headaches. Pertinent negatives for diabetes include no polydipsia, no polyphagia and no polyuria. There are no hypoglycemic complications. Symptoms are worsening. Diabetic complications include retinopathy. Risk factors for coronary artery disease include hypertension, obesity, dyslipidemia, diabetes mellitus and sedentary lifestyle. Current diabetic treatment includes oral agent (monotherapy). She has not had a previous visit with a dietitian. She rarely participates in exercise. (She has not been doing well with her diabetes. ) An ACE inhibitor/angiotensin II receptor blocker is being taken. She does not see a podiatrist.    Past Medical History:  Diagnosis Date   Arthritis    Diabetes mellitus    Hypercholesteremia    Hypertension    Post-menopausal bleeding    Postmenopausal bleeding 08/30/2011   Normal EBX 09/2011.     Family History  Problem Relation Age of Onset   Hypertension Mother  Parkinsonism Father    Hypertension Father    Cancer Father        ?lung   Diabetes Sister     Current Medications[1]   Allergies[2]   Review of Systems  Constitutional: Negative.   HENT:         Continues  to have neck pain on the left side  Eyes: Negative.   Respiratory:  Negative for cough and shortness of breath.   Cardiovascular: Negative.   Gastrointestinal: Negative.   Endocrine: Negative for polydipsia, polyphagia and polyuria.  Musculoskeletal: Negative.   Skin: Negative.   Neurological:  Negative for dizziness and headaches.  Psychiatric/Behavioral: Negative.       Today's Vitals   04/22/24 0848  BP: 112/64  Pulse: 78  Temp: 98.1 F (36.7 C)  TempSrc: Oral  Weight: 196 lb (88.9 kg)  Height: 5' 1 (1.549 m)  PainSc: 0-No pain   Body mass index is 37.03 kg/m.  Wt Readings from Last 3 Encounters:  04/22/24 196 lb (88.9 kg)  03/07/24 196 lb (88.9 kg)  03/03/24 196 lb (88.9 kg)      Objective:  Physical Exam Vitals and nursing note reviewed.  Constitutional:      General: She is not in acute distress.    Appearance: Normal appearance. She is obese.  HENT:     Head: Normocephalic.  Eyes:     Extraocular Movements: Extraocular movements intact.     Conjunctiva/sclera: Conjunctivae normal.     Pupils: Pupils are equal, round, and reactive to light.  Cardiovascular:     Rate and Rhythm: Normal rate and regular rhythm.     Pulses: Normal pulses.     Heart sounds: Normal heart sounds. No murmur heard. Pulmonary:     Effort: Pulmonary effort is normal. No respiratory distress.     Breath sounds: Normal breath sounds. No wheezing.  Chest:     Chest wall: No mass.  Abdominal:     General: Abdomen is flat. Bowel sounds are normal. There is no distension.     Palpations: Abdomen is soft. There is no mass.     Tenderness: There is no abdominal tenderness.  Musculoskeletal:        General: Tenderness (left trapezius muscles are tense and left lateral neck tense) present.     Comments: Using a cane for support  Skin:    General: Skin is warm and dry.     Capillary Refill: Capillary refill takes less than 2 seconds.  Neurological:     General: No focal deficit  present.     Mental Status: She is alert and oriented to person, place, and time.     Cranial Nerves: No cranial nerve deficit.     Motor: No weakness.  Psychiatric:        Mood and Affect: Mood normal.        Behavior: Behavior normal.        Thought Content: Thought content normal.        Judgment: Judgment normal.         Assessment And Plan:   Assessment & Plan Type 2 diabetes mellitus with diabetic peripheral angiopathy without gangrene, without long-term current use of insulin  (HCC) Suboptimal blood sugar control with A1c of 10. Discontinued Farxiga  due to potential hypotension and interaction with Jardiance . Rybelsus  discontinued due to constipation. Consider weekly injection or insulin  if A1c remains high. - Discontinued Farxiga . - Continue Jardiance  10 mg daily.  - Ordered A1c test to reassess blood  sugar control. - Provided sample of Jardiance  10 mg for two weeks. - Consider increasing Jardiance  to 25 mg if A1c remains high. - Discussed potential for weekly injection or insulin  if A1c remains high. - will request records from Dr. 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 Continue current medications.  Aortic atherosclerosis (HCC) [I70.0] Continue statin, tolerating well. Obesity, morbid (HCC) She is encouraged to strive for BMI less than 30 to decrease cardiac risk. Advised to aim for at least 150 minutes of exercise per week.  Cervicalgia Chronic neck pain with bulging disc seen on Xray in November, narrowing, and degeneration. Pain exacerbated by movement, possibly related to arthritis. Previous collarbone fracture confirmed in ER. - Referred to orthopedics for further evaluation of neck pain. - Continue tizanidine  for pain management. - Apply warm compress to neck for muscle relaxation. - she has muscle tension to the left shoulder and neck ILD  (interstitial lung disease) (HCC) Continue f/u with Pulmonary Other long term (current) drug therapy   Orders Placed This Encounter  Procedures   CBC   CMP14+EGFR   Hemoglobin A1c   Lipid panel   Microalbumin / creatinine urine ratio   TSH   Ambulatory referral to Orthopedic Surgery      Return for controlled DM check-4 months.  Patient was given opportunity to ask questions. Patient verbalized understanding of the plan and was able to repeat key elements of the plan. All questions were answered to their satisfaction.    LILLETTE [name], FNP, have reviewed all documentation for this visit. The documentation on 04/22/2024 for the exam, diagnosis, procedures, and orders are all accurate and complete.   IF YOU HAVE BEEN REFERRED TO A SPECIALIST, IT MAY TAKE 1-2 WEEKS TO SCHEDULE/PROCESS THE REFERRAL. IF YOU HAVE NOT HEARD FROM US /SPECIALIST IN TWO WEEKS, PLEASE GIVE US  A CALL AT [phone].      [1]  Current Outpatient Medications:    ACCU-CHEK GUIDE TEST test strip, USE AS DIRECTED (TESTING TWICE  DAILY AT 10AM AND 5PM), Disp: 200 strip, Rfl: 2   Accu-Chek Softclix Lancets lancets, USE AS DIRECTED, Disp: 200 each, Rfl: 2   albuterol  (VENTOLIN  HFA) 108 (90 Base) MCG/ACT inhaler, Inhale 2 puffs into the lungs every 6 (six) hours as needed., Disp: 18 g, Rfl: 1   atenolol  (TENORMIN ) 100 MG tablet, Take 100 mg by mouth daily., Disp: , Rfl:    atorvastatin  (LIPITOR) 40 MG tablet, Take 40 mg by mouth at bedtime., Disp: , Rfl:    benzonatate  (TESSALON ) 100 MG capsule, Take 1 capsule (100 mg total) by mouth 2 (two) times daily as needed (Throat soothing)., Disp: 20 capsule, Rfl: 0   Blood Glucose Monitoring Suppl (ACCU-CHEK GUIDE) w/Device KIT, USE TO CHECK BLOOD SUGAR TWICE  DAILY AT 10AM AND 5 PM, Disp: 1 kit, Rfl: 1   Blood Pressure Monitoring (ADULT BLOOD PRESSURE CUFF LG) KIT, Take blood pressure daily and as needed, Disp: 1 kit, Rfl: 0   D3-50 50000 units capsule, Take 50,000  Units by mouth once a week., Disp: , Rfl: 0   dorzolamide -timolol  (COSOPT ) 2-0.5 % ophthalmic solution, Place 1 drop into both eyes 2 (two) times daily., Disp: , Rfl:    fluticasone  (FLONASE ) 50 MCG/ACT nasal spray, Place 2 sprays into both nostrils daily., Disp: 16 g, Rfl: 2   fluticasone  furoate-vilanterol (BREO ELLIPTA ) 200-25 MCG/ACT AEPB, Inhale 1 puff into the lungs  daily., Disp: 60 each, Rfl: 11   glimepiride  (AMARYL ) 2 MG tablet, TAKE 1 TABLET BY MOUTH IN THE  MORNING AND AT BEDTIME, Disp: 200 tablet, Rfl: 2   guaiFENesin  (MUCINEX ) 600 MG 12 hr tablet, Take 1 tablet (600 mg total) by mouth 2 (two) times daily., Disp: 60 tablet, Rfl: 2   [name] KIT, Use to test blood sugar twice daily as directed. E11.65, Disp: 1 kit, Rfl: 12   latanoprost  (XALATAN ) 0.005 % ophthalmic solution, Place 1 drop into both eyes at bedtime., Disp: , Rfl:    losartan  (COZAAR ) 50 MG tablet, Take 50 mg by mouth daily., Disp: , Rfl:    tiZANidine  (ZANAFLEX ) 4 MG tablet, Take 0.5 tablets (2 mg total) by mouth 2 (two) times daily as needed for muscle spasms., Disp: 30 tablet, Rfl: 0   empagliflozin  (JARDIANCE ) 10 MG TABS tablet, Take 1 tablet (10 mg total) by mouth daily., Disp: 90 tablet, Rfl: 2   guaiFENesin -dextromethorphan  (ROBITUSSIN DM) 100-10 MG/5ML syrup, Take 10 mLs by mouth every 6 (six) hours as needed for cough. (Patient not taking: Reported on 04/22/2024), Disp: 118 mL, Rfl: 0 [2]  Allergies Allergen Reactions   Grass Pollen(K-O-[id number]-T-Swt Vern) Other (See Comments)    Sneezing, watery eyes

## 2024-04-22 NOTE — Assessment & Plan Note (Addendum)
 Continue f/u with Pulmonary

## 2024-04-22 NOTE — Assessment & Plan Note (Addendum)
 Continue statin, tolerating well

## 2024-04-22 NOTE — Assessment & Plan Note (Addendum)
 Blood pressure is well controlled. Continue current medications.

## 2024-04-22 NOTE — Assessment & Plan Note (Addendum)
 Suboptimal blood sugar control with A1c of 10. Discontinued Farxiga  due to potential hypotension and interaction with Jardiance . Rybelsus  discontinued due to constipation. Consider weekly injection or insulin  if A1c remains high. - Discontinued Farxiga . - Continue Jardiance  10 mg daily.  - Ordered A1c test to reassess blood sugar control. - Provided sample of Jardiance  10 mg for two weeks. - Consider increasing Jardiance  to 25 mg if A1c remains high. - Discussed potential for weekly injection or insulin  if A1c remains high. - will request records from Dr. [facility] for her most recent diabetic eye exam

## 2024-04-22 NOTE — Assessment & Plan Note (Addendum)
 Chronic neck pain with bulging disc seen on Xray in November, narrowing, and degeneration. Pain exacerbated by movement, possibly related to arthritis. Previous collarbone fracture confirmed in ER. - Referred to orthopedics for further evaluation of neck pain. - Continue tizanidine  for pain management. - Apply warm compress to neck for muscle relaxation. - she has muscle tension to the left shoulder and neck

## 2024-04-23 LAB — MICROALBUMIN / CREATININE URINE RATIO
Creatinine, Urine: 69 mg/dL
Microalb/Creat Ratio: 34 mg/g{creat} — ABNORMAL HIGH (ref 0–29)
Microalbumin, Urine: 23.6 ug/mL

## 2024-04-24 ENCOUNTER — Other Ambulatory Visit: Payer: Self-pay

## 2024-04-24 NOTE — Patient Outreach (Signed)
 Complex Care Management   Visit Note  04/24/2024  Name:  Joanna Sandoval MRN: [id number] DOB: [birth year]  Situation: Referral received for Complex Care Management related to DMII, OSA w/CPAP, chronic cough, Interstitial Lung Disease, Glaucoma, Fall with neck injury. I obtained verbal consent from Patient.  Visit completed with Patient on the phone.  Background:   Past Medical History:  Diagnosis Date   Arthritis    Diabetes mellitus    Hypercholesteremia    Hypertension    Post-menopausal bleeding    Postmenopausal bleeding 08/30/2011   Normal EBX 09/2011.    Assessment: Patient Reported Symptoms:  Cognitive Cognitive Status: Alert and oriented to person, place, and time, Normal speech and language skills Cognitive/Intellectual Conditions Management [RPT]: None reported or documented in medical history or problem list   Health Maintenance Behaviors: Annual physical exam, Immunizations, Healthy diet Health Facilitated by: Rest, Pain control, Healthy diet  Neurological Neurological Review of Symptoms: No symptoms reported Neurological Management Strategies: Routine screening, Medication therapy Neurological Self-Management Outcome: 4 (good)  HEENT HEENT Symptoms Reported: No symptoms reported      Cardiovascular Cardiovascular Symptoms Reported: No symptoms reported    Respiratory Respiratory Symptoms Reported: Shortness of breath, Productive cough Respiratory Management Strategies: Routine screening, Medication therapy, Adequate rest Respiratory Self-Management Outcome: 4 (good)  Endocrine Endocrine Symptoms Reported: No symptoms reported Is patient diabetic?: Yes Is patient checking blood sugars at home?: Yes List most recent blood sugar readings, include date and time of day: not given for today Endocrine Self-Management Outcome: 4 (good) Endocrine Comment: Reviewed recent A1C, trending downward  Gastrointestinal Gastrointestinal Symptoms Reported: Not assessed       Genitourinary Genitourinary Symptoms Reported: Not assessed    Integumentary Integumentary Symptoms Reported: Not assessed    Musculoskeletal Musculoskelatal Symptoms Reviewed: Limited mobility, Unsteady gait Musculoskeletal Management Strategies: Adequate rest, Routine screening Musculoskeletal Self-Management Outcome: 4 (good) Falls in the past year?: Yes Number of falls in past year: 1 or less Was there an injury with Fall?: Yes Fall Risk Category Calculator: 2 Patient Fall Risk Level: Moderate Fall Risk Patient at Risk for Falls Due to: History of fall(s), Impaired balance/gait, Impaired mobility Fall risk Follow up: Education provided, Falls evaluation completed, Falls prevention discussed  Psychosocial Psychosocial Symptoms Reported: No symptoms reported   Major Change/Loss/Stressor/Fears (CP): Denies Quality of Family Relationships: helpful, involved, supportive    04/24/2024    PHQ2-9 Depression Screening   Ravi Tuccillo interest or pleasure in doing things    Feeling down, depressed, or hopeless    PHQ-2 - Total Score    Trouble falling or staying asleep, or sleeping too much    Feeling tired or having Ketzia Guzek energy    Poor appetite or overeating     Feeling bad about yourself - or that you are a failure or have let yourself or your family down    Trouble concentrating on things, such as reading the newspaper or watching television    Moving or speaking so slowly that other people could have noticed.  Or the opposite - being so fidgety or restless that you have been moving around a lot more than usual    Thoughts that you would be better off dead, or hurting yourself in some way    PHQ2-9 Total Score    If you checked off any problems, how difficult have these problems made it for you to do your work, take care of things at home, or get along with other people    Depression Interventions/Treatment  There were no vitals filed for this visit. Pain Scale: 0-10 Pain Score: 5   Pain Type: Chronic pain Pain Location: Neck Pain Orientation: Posterior Pain Descriptors / Indicators: Aching, Discomfort Pain Onset: On-going Pain Intervention(s): Medication (See eMAR), Rest, Repositioned  Medications Reviewed Today     Reviewed by [name], RN (Registered Nurse) on 04/24/24 at [phone]  Med List Status: <None>   Medication Order Taking? Sig Documenting Provider Last Dose Status Informant  ACCU-CHEK GUIDE TEST test strip [id number] Yes USE AS DIRECTED (TESTING TWICE  DAILY AT 10AM AND 5PM) Georgina Speaks, FNP  Active   Accu-Chek Softclix Lancets lancets [id number] Yes USE AS DIRECTED Georgina Speaks, FNP  Active Self, Pharmacy Records  albuterol  (VENTOLIN  HFA) 108 [phone] Base) MCG/ACT inhaler [id number] Yes Inhale 2 puffs into the lungs every 6 (six) hours as needed. Georgina Speaks, FNP  Active   atenolol  (TENORMIN ) 100 MG tablet [id number] Yes Take 100 mg by mouth daily. [provider]  Active Self, Pharmacy Records  atorvastatin  (LIPITOR) 40 MG tablet [id number] Yes Take 40 mg by mouth at bedtime. [provider]  Active Self, Pharmacy Records  benzonatate  (TESSALON ) 100 MG capsule [id number] Yes Take 1 capsule (100 mg total) by mouth 2 (two) times daily as needed (Throat soothing). Von Bellis, MD  Active   Blood Glucose Monitoring Suppl (ACCU-CHEK GUIDE) w/Device KIT [id number] Yes USE TO CHECK BLOOD SUGAR TWICE  DAILY AT 10AM AND 5 PM Georgina Speaks, FNP  Active Self, Pharmacy Records  Blood Pressure Monitoring (ADULT BLOOD PRESSURE CUFF LG) KIT [id number] Yes Take blood pressure daily and as needed Georgina Speaks, FNP  Active Self, Pharmacy Records  D3-50 50000 units capsule [id number] Yes Take 50,000 Units by mouth once a week. [provider]  Active Self, Pharmacy Records  dorzolamide -timolol  (COSOPT ) 2-0.5 % ophthalmic solution [id number] Yes Place 1 drop into both eyes 2 (two) times daily. [provider]  Active Self, Pharmacy Records   empagliflozin  (JARDIANCE ) 10 MG TABS tablet [id number]  Take 1 tablet (10 mg total) by mouth daily. Georgina Speaks, FNP  Active   fluticasone  (FLONASE ) 50 MCG/ACT nasal spray [id number] Yes Place 2 sprays into both nostrils daily. Georgina Speaks, FNP  Active Self, Pharmacy Records  fluticasone  furoate-vilanterol (BREO ELLIPTA ) 200-25 MCG/ACT AEPB [id number] Yes Inhale 1 puff into the lungs daily. Von Bellis, MD  Active   glimepiride  (AMARYL ) 2 MG tablet [id number] Yes TAKE 1 TABLET BY MOUTH IN THE  MORNING AND AT BEDTIME Georgina Speaks, FNP  Active Self, Pharmacy Records  guaiFENesin  (MUCINEX ) 600 MG 12 hr tablet [id number] Yes Take 1 tablet (600 mg total) by mouth 2 (two) times daily. Zaida Zola SAILOR, MD  Active   guaiFENesin -dextromethorphan  (ROBITUSSIN DM) 100-10 MG/5ML syrup [id number]  Take 10 mLs by mouth every 6 (six) hours as needed for cough.  Patient not taking: Reported on 04/22/2024   Von Bellis, MD  Active   Lancets Misc. [facility] FASTCLIX LANCET) KIT [id number] Yes Use to test blood sugar twice daily as directed. E11.65 Georgina Speaks, FNP  Active   latanoprost  (XALATAN ) 0.005 % ophthalmic solution [id number] Yes Place 1 drop into both eyes at bedtime. [provider]  Active Self, Pharmacy Records  losartan  (COZAAR ) 50 MG tablet [id number] Yes Take 50 mg by mouth daily. [provider]  Active Self, Pharmacy Records  tiZANidine  (ZANAFLEX ) 4 MG tablet [id number] Yes Take 0.5 tablets (2 mg total) by mouth 2 (two) times daily as needed for muscle spasms. Rogelia Satterfield  S, MD  Active             Recommendation:   Specialty provider follow-up    05/02/2024 Status: Sch   Time: 10:15 AM Length: 30  Visit Type: NEW PATIENT SPECIALTY PRACTICE [2256] Copay: $0.00  Provider: Persons, Ronal Dragon, [place] Department: [facility]    05/05/2024 Status: Sch   Time: 3:30 PM Length: 60  Visit Type: PFT - PULM [8023] Copay: $0.00  Provider: [facility] Department:  [facility]    05/06/2024 Status: Sch   Time: 9:00 AM Length: 30  Visit Type: OFFICE VISIT - PULM [8022] Copay: $0.00  Provider: Zaida Zola SAILOR, MD Department: [facility]   Follow Up Plan:   Telephone follow up appointment date/time:     05/15/2024 Status: Sch   Time: 9:30 AM Length: 30  Visit Type: [facility]I TELEPHONE CALL 30 [2502] Copay: $0.00  Provider: [name], RN Department: [facility]   Clayborne Morgan RN BSN CCM [place]  [facility], [facility] Health Nurse Care Coordinator  Direct Dial: [phone] Website: [email or web address]

## 2024-04-24 NOTE — Patient Instructions (Signed)
 Visit Information  Thank you for taking time to visit with me today. Please don't hesitate to contact me if I can be of assistance to you before our next scheduled appointment.  Your next care management appointment is by telephone on Friday, January 29 at 09:30 AM  Please call the care guide team at [phone] if you need to cancel, schedule, or reschedule an appointment.   Please call 1-800-273-TALK (toll free, 24 hour hotline) if you are experiencing a Mental Health or Behavioral Health Crisis or need someone to talk to.  Clayborne Ly RN BSN CCM [place]  [facility], [facility] Health Nurse Care Coordinator  Direct Dial: [phone] Website: [email or web address]

## 2024-05-02 ENCOUNTER — Ambulatory Visit: Admitting: Physician Assistant

## 2024-05-02 ENCOUNTER — Inpatient Hospital Stay: Admitting: Family Medicine

## 2024-05-05 ENCOUNTER — Encounter

## 2024-05-06 ENCOUNTER — Ambulatory Visit

## 2024-05-15 ENCOUNTER — Other Ambulatory Visit: Payer: Self-pay

## 2024-05-15 NOTE — Patient Outreach (Addendum)
 Complex Care Management   Visit Note  05/15/2024  Name:  Joanna Sandoval MRN: [id number] DOB: 1946/04/12  Situation: Referral received for Complex Care Management related to DMII, OSA w/CPAP, chronic cough, Interstitial Lung Disease, Glaucoma, Fall with neck injury. I obtained verbal consent from Patient.  Visit completed with Patient on the phone.  Background:   Past Medical History:  Diagnosis Date   Arthritis    Diabetes mellitus    Hypercholesteremia    Hypertension    Post-menopausal bleeding    Postmenopausal bleeding 08/30/2011   Normal EBX 09/2011.    Assessment: Patient Reported Symptoms:  Cognitive Cognitive Status: Alert and oriented to person, place, and time, Normal speech and language skills Cognitive/Intellectual Conditions Management [RPT]: None reported or documented in medical history or problem list   Health Maintenance Behaviors: Healthy diet, Annual physical exam, Immunizations Health Facilitated by: Healthy diet, Rest, Pain control  Neurological Neurological Review of Symptoms: No symptoms reported    HEENT HEENT Symptoms Reported: Other: (post nasal drip) HEENT Management Strategies: Medication therapy, Routine screening HEENT Self-Management Outcome: 4 (good)    Cardiovascular Cardiovascular Symptoms Reported: No symptoms reported    Respiratory Respiratory Symptoms Reported: Productive cough Respiratory Management Strategies: CPAP, Routine screening, Medication therapy, Adequate rest Respiratory Self-Management Outcome: 4 (good)  Endocrine Endocrine Symptoms Reported: No symptoms reported Is patient diabetic?: Yes Is patient checking blood sugars at home?: Yes List most recent blood sugar readings, include date and time of day: FBS today 120 Endocrine Self-Management Outcome: 4 (good)  Gastrointestinal Gastrointestinal Symptoms Reported: No symptoms reported      Genitourinary Genitourinary Symptoms Reported: No symptoms reported     Integumentary Integumentary Symptoms Reported: No symptoms reported    Musculoskeletal Musculoskelatal Symptoms Reviewed: Limited mobility, Unsteady gait Musculoskeletal Management Strategies: Adequate rest, Routine screening Musculoskeletal Self-Management Outcome: 4 (good) Falls in the past year?: Yes (no falls since last nurse contact per patient) Number of falls in past year: 1 or less Was there an injury with Fall?: Yes Fall Risk Category Calculator: 2 Patient Fall Risk Level: Moderate Fall Risk Patient at Risk for Falls Due to: Impaired balance/gait, Impaired mobility, History of fall(s), Impaired vision Fall risk Follow up: Falls prevention discussed, Education provided, Falls evaluation completed  Psychosocial Psychosocial Symptoms Reported: No symptoms reported   Major Change/Loss/Stressor/Fears (CP): Denies Quality of Family Relationships: helpful, involved, supportive Do you feel physically threatened by others?: No    05/15/2024    PHQ2-9 Depression Screening   Chrishelle Zito interest or pleasure in doing things    Feeling down, depressed, or hopeless    PHQ-2 - Total Score    Trouble falling or staying asleep, or sleeping too much    Feeling tired or having Fread Kottke energy    Poor appetite or overeating     Feeling bad about yourself - or that you are a failure or have let yourself or your family down    Trouble concentrating on things, such as reading the newspaper or watching television    Moving or speaking so slowly that other people could have noticed.  Or the opposite - being so fidgety or restless that you have been moving around a lot more than usual    Thoughts that you would be better off dead, or hurting yourself in some way    PHQ2-9 Total Score    If you checked off any problems, how difficult have these problems made it for you to do your work, take care of things at home,  or get along with other people    Depression Interventions/Treatment      There were no  vitals filed for this visit. Pain Scale: 0-10 Pain Type: Chronic pain Pain Location: Neck Pain Orientation: Mid Pain Descriptors / Indicators: Aching, Discomfort Pain Onset: On-going Pain Intervention(s): Medication (See eMAR), Rest, Repositioned  Medications Reviewed Today     Reviewed by [name], RN (Registered Nurse) on 05/15/24 at 0940  Med List Status: <None>   Medication Order Taking? Sig Documenting Provider Last Dose Status Informant  ACCU-CHEK GUIDE TEST test strip [id number]  USE AS DIRECTED (TESTING TWICE  DAILY AT 10AM AND 5PM) Georgina Speaks, FNP  Active   Accu-Chek Softclix Lancets lancets [id number]  USE AS DIRECTED Georgina Speaks, FNP  Active Self, Pharmacy Records  albuterol  (VENTOLIN  HFA) 108 [phone] Base) MCG/ACT inhaler [id number]  Inhale 2 puffs into the lungs every 6 (six) hours as needed. Georgina Speaks, FNP  Active   atenolol  (TENORMIN ) 100 MG tablet [id number]  Take 100 mg by mouth daily. [provider]  Active Self, Pharmacy Records  atorvastatin  (LIPITOR) 40 MG tablet [id number]  Take 40 mg by mouth at bedtime. [provider]  Active Self, Pharmacy Records  benzonatate  (TESSALON ) 100 MG capsule [id number]  Take 1 capsule (100 mg total) by mouth 2 (two) times daily as needed (Throat soothing). Von Bellis, MD  Active   Blood Glucose Monitoring Suppl (ACCU-CHEK GUIDE) w/Device KIT [id number]  USE TO CHECK BLOOD SUGAR TWICE  DAILY AT 10AM AND 5 PM Georgina Speaks, FNP  Active Self, Pharmacy Records  Blood Pressure Monitoring (ADULT BLOOD PRESSURE CUFF LG) KIT [id number]  Take blood pressure daily and as needed Georgina Speaks, FNP  Active Self, Pharmacy Records  D3-50 50000 units capsule [id number]  Take 50,000 Units by mouth once a week. [provider]  Active Self, Pharmacy Records  dorzolamide -timolol  (COSOPT ) 2-0.5 % ophthalmic solution [id number]  Place 1 drop into both eyes 2 (two) times daily. [provider]  Active Self, Pharmacy  Records  empagliflozin  (JARDIANCE ) 10 MG TABS tablet [id number]  Take 1 tablet (10 mg total) by mouth daily. Georgina Speaks, FNP  Active   fluticasone  (FLONASE ) 50 MCG/ACT nasal spray [id number]  Place 2 sprays into both nostrils daily. Georgina Speaks, FNP  Active Self, Pharmacy Records  fluticasone  furoate-vilanterol (BREO ELLIPTA ) 200-25 MCG/ACT AEPB [id number]  Inhale 1 puff into the lungs daily. Von Bellis, MD  Active   glimepiride  (AMARYL ) 2 MG tablet [id number]  TAKE 1 TABLET BY MOUTH IN THE  MORNING AND AT BEDTIME Georgina Speaks, FNP  Active Self, Pharmacy Records  guaiFENesin  (MUCINEX ) 600 MG 12 hr tablet [id number]  Take 1 tablet (600 mg total) by mouth 2 (two) times daily. Zaida Zola SAILOR, MD  Active   guaiFENesin -dextromethorphan  (ROBITUSSIN DM) 100-10 MG/5ML syrup [id number]  Take 10 mLs by mouth every 6 (six) hours as needed for cough.  Patient not taking: Reported on 04/22/2024   Von Bellis, MD  Active   Lancets Misc. [facility] FASTCLIX LANCET) KIT [id number]  Use to test blood sugar twice daily as directed. E11.65 Georgina Speaks, FNP  Active   latanoprost  (XALATAN ) 0.005 % ophthalmic solution [id number]  Place 1 drop into both eyes at bedtime. [provider]  Active Self, Pharmacy Records  losartan  (COZAAR ) 50 MG tablet [id number]  Take 50 mg by mouth daily. [provider]  Active Self, Pharmacy Records  tiZANidine  (ZANAFLEX ) 4 MG tablet [id number]  Take 0.5 tablets (2 mg total) by  mouth 2 (two) times daily as needed for muscle spasms. Rogelia Jerilynn RAMAN, MD  Active             Recommendation:   Specialty provider follow-up   05/27/2024 Status: Sch   Time: 9:45 AM Length: 30  Visit Type: NEW PATIENT SPECIALTY PRACTICE [2256] Copay: $0.00  Provider: Trudy Duwaine BRAVO, NP Department: [facility]    06/16/2024 Status: Sch   Time: 7:45 AM Length: 30  Visit Type: OFFICE VISIT 30 [368] Copay: $0.00  Provider: Buck Saucer, MD Department: [facility]    06/18/2024 Status: Sch   Time: 9:00 AM Length: 60  Visit Type: PFT - PULM [8023] Copay: $0.00  Provider: [facility] Department: [facility]    06/19/2024 Status: Sch   Time: 9:00 AM Length: 15  Visit Type: OFFICE VISIT - PULM [8022] Copay: $0.00  Provider: Zaida Zola SAILOR, MD Department: [facility]   Follow Up Plan:   Telephone follow up appointment date/time  06/20/2024 Status: Sch   Time: 9:30 AM Length: 30  Visit Type: VBCI TELEPHONE CALL 30 [2502] Copay: $0.00  Provider: [name], RN Department: [facility]   Clayborne Morgan RN BSN CCM [place]  [facility], [facility] Health Nurse Care Coordinator  Direct Dial: [phone] Website: [email or web address]

## 2024-05-15 NOTE — Patient Instructions (Signed)
 Visit Information  Thank you for taking time to visit with me today. Please don't hesitate to contact me if I can be of assistance to you before our next scheduled appointment.  Your next care management appointment is by telephone on Friday, February 6 at 09:30 AM  Please call the care guide team at [phone] if you need to cancel, schedule, or reschedule an appointment.   Please call 1-800-273-TALK (toll free, 24 hour hotline) if you are experiencing a Mental Health or Behavioral Health Crisis or need someone to talk to.  Clayborne Ly RN BSN CCM [place]  [facility], [facility] Health Nurse Care Coordinator  Direct Dial: [phone] Website: [email or web address]

## 2024-05-22 ENCOUNTER — Telehealth: Payer: Self-pay

## 2024-05-22 NOTE — Telephone Encounter (Signed)
 [facility] requested rx,medical records,clinical notes, and progress notes to be faxed over for billing purposes.  Paperwork has been faxed back on 05/21/24.

## 2024-05-27 ENCOUNTER — Ambulatory Visit: Admitting: Physical Medicine and Rehabilitation

## 2024-06-16 ENCOUNTER — Ambulatory Visit: Admitting: Neurology

## 2024-06-18 ENCOUNTER — Encounter

## 2024-06-19 ENCOUNTER — Ambulatory Visit

## 2024-06-20 ENCOUNTER — Telehealth

## 2024-08-18 ENCOUNTER — Encounter: Payer: Self-pay | Admitting: Nurse Practitioner

## 2024-08-20 ENCOUNTER — Ambulatory Visit: Payer: Self-pay | Admitting: Nurse Practitioner

## 2024-10-29 ENCOUNTER — Ambulatory Visit: Payer: Self-pay
